# Patient Record
Sex: Male | Born: 1955 | Race: Black or African American | Hispanic: No | Marital: Married | Smoking: Current every day smoker
Health system: Southern US, Community
[De-identification: ages and names within clinical notes are randomized; demographics above are authoritative.]

## PROBLEM LIST (undated history)

## (undated) DIAGNOSIS — Z8719 Personal history of other diseases of the digestive system: Secondary | ICD-10-CM

## (undated) DIAGNOSIS — Z9289 Personal history of other medical treatment: Secondary | ICD-10-CM

## (undated) DIAGNOSIS — R7303 Prediabetes: Secondary | ICD-10-CM

## (undated) DIAGNOSIS — E7800 Pure hypercholesterolemia, unspecified: Secondary | ICD-10-CM

## (undated) DIAGNOSIS — E46 Unspecified protein-calorie malnutrition: Secondary | ICD-10-CM

## (undated) DIAGNOSIS — J449 Chronic obstructive pulmonary disease, unspecified: Secondary | ICD-10-CM

## (undated) DIAGNOSIS — I071 Rheumatic tricuspid insufficiency: Secondary | ICD-10-CM

## (undated) DIAGNOSIS — R0989 Other specified symptoms and signs involving the circulatory and respiratory systems: Secondary | ICD-10-CM

## (undated) DIAGNOSIS — I272 Pulmonary hypertension, unspecified: Secondary | ICD-10-CM

## (undated) DIAGNOSIS — R0600 Dyspnea, unspecified: Secondary | ICD-10-CM

## (undated) DIAGNOSIS — I219 Acute myocardial infarction, unspecified: Secondary | ICD-10-CM

## (undated) DIAGNOSIS — M1990 Unspecified osteoarthritis, unspecified site: Secondary | ICD-10-CM

## (undated) DIAGNOSIS — F1010 Alcohol abuse, uncomplicated: Secondary | ICD-10-CM

## (undated) DIAGNOSIS — I739 Peripheral vascular disease, unspecified: Secondary | ICD-10-CM

## (undated) DIAGNOSIS — K219 Gastro-esophageal reflux disease without esophagitis: Secondary | ICD-10-CM

## (undated) DIAGNOSIS — I10 Essential (primary) hypertension: Secondary | ICD-10-CM

## (undated) DIAGNOSIS — I340 Nonrheumatic mitral (valve) insufficiency: Secondary | ICD-10-CM

## (undated) DIAGNOSIS — I5022 Chronic systolic (congestive) heart failure: Secondary | ICD-10-CM

## (undated) DIAGNOSIS — D649 Anemia, unspecified: Secondary | ICD-10-CM

## (undated) HISTORY — PX: PERIPHERAL VASCULAR INTERVENTION: CATH118257

## (undated) HISTORY — PX: COLONOSCOPY: SHX174

## (undated) HISTORY — DX: Pure hypercholesterolemia, unspecified: E78.00

## (undated) HISTORY — PX: HEMORRHOIDECTOMY WITH HEMORRHOID BANDING: SHX5633

---

## 2002-03-09 DIAGNOSIS — Z9289 Personal history of other medical treatment: Secondary | ICD-10-CM

## 2002-03-09 DIAGNOSIS — Z8711 Personal history of peptic ulcer disease: Secondary | ICD-10-CM

## 2002-03-09 DIAGNOSIS — I219 Acute myocardial infarction, unspecified: Secondary | ICD-10-CM

## 2002-03-09 HISTORY — DX: Personal history of peptic ulcer disease: Z87.11

## 2002-03-09 HISTORY — DX: Personal history of other medical treatment: Z92.89

## 2002-03-09 HISTORY — DX: Acute myocardial infarction, unspecified: I21.9

## 2015-03-10 DIAGNOSIS — I071 Rheumatic tricuspid insufficiency: Secondary | ICD-10-CM

## 2015-03-10 HISTORY — DX: Rheumatic tricuspid insufficiency: I07.1

## 2016-01-18 DIAGNOSIS — Z720 Tobacco use: Secondary | ICD-10-CM | POA: Insufficient documentation

## 2016-01-18 DIAGNOSIS — I5022 Chronic systolic (congestive) heart failure: Secondary | ICD-10-CM | POA: Insufficient documentation

## 2016-01-18 DIAGNOSIS — R238 Other skin changes: Secondary | ICD-10-CM | POA: Insufficient documentation

## 2016-01-18 DIAGNOSIS — J9601 Acute respiratory failure with hypoxia: Secondary | ICD-10-CM | POA: Insufficient documentation

## 2016-01-18 DIAGNOSIS — E785 Hyperlipidemia, unspecified: Secondary | ICD-10-CM | POA: Insufficient documentation

## 2016-01-18 DIAGNOSIS — E871 Hypo-osmolality and hyponatremia: Secondary | ICD-10-CM | POA: Insufficient documentation

## 2016-01-18 DIAGNOSIS — Z789 Other specified health status: Secondary | ICD-10-CM | POA: Insufficient documentation

## 2016-01-19 DIAGNOSIS — E876 Hypokalemia: Secondary | ICD-10-CM | POA: Insufficient documentation

## 2016-01-21 ENCOUNTER — Emergency Department (HOSPITAL_BASED_OUTPATIENT_CLINIC_OR_DEPARTMENT_OTHER)
Admission: EM | Admit: 2016-01-21 | Discharge: 2016-01-21 | Disposition: A | Discharge status: Home / Self Care | Payer: Self-pay | Attending: Emergency Medicine | Admitting: Emergency Medicine

## 2016-01-21 ENCOUNTER — Emergency Department (HOSPITAL_BASED_OUTPATIENT_CLINIC_OR_DEPARTMENT_OTHER): Payer: Self-pay

## 2016-01-21 ENCOUNTER — Encounter (HOSPITAL_BASED_OUTPATIENT_CLINIC_OR_DEPARTMENT_OTHER): Payer: Self-pay | Admitting: *Deleted

## 2016-01-21 DIAGNOSIS — L98491 Non-pressure chronic ulcer of skin of other sites limited to breakdown of skin: Secondary | ICD-10-CM

## 2016-01-21 DIAGNOSIS — Z7982 Long term (current) use of aspirin: Secondary | ICD-10-CM | POA: Insufficient documentation

## 2016-01-21 DIAGNOSIS — I110 Hypertensive heart disease with heart failure: Secondary | ICD-10-CM | POA: Insufficient documentation

## 2016-01-21 DIAGNOSIS — Z79899 Other long term (current) drug therapy: Secondary | ICD-10-CM | POA: Insufficient documentation

## 2016-01-21 DIAGNOSIS — F17210 Nicotine dependence, cigarettes, uncomplicated: Secondary | ICD-10-CM | POA: Insufficient documentation

## 2016-01-21 DIAGNOSIS — L97911 Non-pressure chronic ulcer of unspecified part of right lower leg limited to breakdown of skin: Secondary | ICD-10-CM | POA: Insufficient documentation

## 2016-01-21 DIAGNOSIS — E876 Hypokalemia: Secondary | ICD-10-CM | POA: Insufficient documentation

## 2016-01-21 DIAGNOSIS — I5023 Acute on chronic systolic (congestive) heart failure: Secondary | ICD-10-CM | POA: Insufficient documentation

## 2016-01-21 DIAGNOSIS — E11622 Type 2 diabetes mellitus with other skin ulcer: Secondary | ICD-10-CM | POA: Insufficient documentation

## 2016-01-21 HISTORY — DX: Essential (primary) hypertension: I10

## 2016-01-21 LAB — CBC WITH DIFFERENTIAL/PLATELET
BASOS ABS: 0 10*3/uL (ref 0.0–0.1)
BASOS PCT: 1 %
EOS ABS: 0 10*3/uL (ref 0.0–0.7)
Eosinophils Relative: 1 %
HCT: 40.5 % (ref 39.0–52.0)
HEMOGLOBIN: 14.1 g/dL (ref 13.0–17.0)
LYMPHS ABS: 1.1 10*3/uL (ref 0.7–4.0)
Lymphocytes Relative: 34 %
MCH: 32.8 pg (ref 26.0–34.0)
MCHC: 34.8 g/dL (ref 30.0–36.0)
MCV: 94.2 fL (ref 78.0–100.0)
Monocytes Absolute: 0.4 10*3/uL (ref 0.1–1.0)
Monocytes Relative: 12 %
NEUTROS PCT: 52 %
Neutro Abs: 1.7 10*3/uL (ref 1.7–7.7)
Platelets: 201 10*3/uL (ref 150–400)
RBC: 4.3 MIL/uL (ref 4.22–5.81)
RDW: 14.6 % (ref 11.5–15.5)
WBC: 3.3 10*3/uL — AB (ref 4.0–10.5)

## 2016-01-21 LAB — BASIC METABOLIC PANEL
ANION GAP: 9 (ref 5–15)
BUN: 8 mg/dL (ref 6–20)
CALCIUM: 8.5 mg/dL — AB (ref 8.9–10.3)
CO2: 24 mmol/L (ref 22–32)
Chloride: 97 mmol/L — ABNORMAL LOW (ref 101–111)
Creatinine, Ser: 0.72 mg/dL (ref 0.61–1.24)
Glucose, Bld: 90 mg/dL (ref 65–99)
Potassium: 3.3 mmol/L — ABNORMAL LOW (ref 3.5–5.1)
SODIUM: 130 mmol/L — AB (ref 135–145)

## 2016-01-21 LAB — BRAIN NATRIURETIC PEPTIDE: B NATRIURETIC PEPTIDE 5: 1227 pg/mL — AB (ref 0.0–100.0)

## 2016-01-21 LAB — TROPONIN I

## 2016-01-21 MED ORDER — SILVER SULFADIAZINE 1 % EX CREA
TOPICAL_CREAM | Freq: Every day | CUTANEOUS | Status: DC
  Administered 2016-01-21: 1 via TOPICAL
  Filled 2016-01-21: qty 85

## 2016-01-21 MED ORDER — POTASSIUM CHLORIDE CRYS ER 20 MEQ PO TBCR: 20.0000 meq | EXTENDED_RELEASE_TABLET | Freq: Every day | ORAL | 0 refills | Status: DC

## 2016-01-21 MED ORDER — ALBUTEROL SULFATE HFA 108 (90 BASE) MCG/ACT IN AERS
2.0000 | INHALATION_SPRAY | Freq: Once | RESPIRATORY_TRACT | Status: AC
  Administered 2016-01-21: 2 via RESPIRATORY_TRACT
  Filled 2016-01-21: qty 6.7

## 2016-01-21 MED ORDER — FUROSEMIDE 10 MG/ML IJ SOLN
80.0000 mg | Freq: Once | INTRAMUSCULAR | Status: AC
  Administered 2016-01-21: 80 mg via INTRAVENOUS
  Filled 2016-01-21: qty 8

## 2016-01-21 MED ORDER — POTASSIUM CHLORIDE CRYS ER 20 MEQ PO TBCR
40.0000 meq | EXTENDED_RELEASE_TABLET | Freq: Once | ORAL | Status: AC
  Administered 2016-01-21: 40 meq via ORAL
  Filled 2016-01-21: qty 2

## 2016-01-21 NOTE — ED Triage Notes (Signed)
Pt has lopressor and lisinopril in his medication bag that he has with him.  States that he was unaware that he was rx them.  States that he has plans to return them to the community clinic.

## 2016-01-21 NOTE — ED Provider Notes (Signed)
[facility] Provider Note   CSN: [id number] Arrival date & time: 01/21/16  1759  By signing my name below, I, [name], attest that this documentation has been prepared under the direction and in the presence of Rolan Bucco, MD. Electronically Signed: [name], ED Scribe. 01/21/16. 6:43 PM.  History   Chief Complaint Chief Complaint  Patient presents with  . Leg Swelling   The history is provided by the patient. No language interpreter was used.  HPI Comments: Wayne [name] is a 60 y.o. male with a hx of CHF, DM, and HTN who presents to the Emergency Department complaining of bilateral leg swelling onset several months ago. Pt says that there is a large blister to the anterior right leg that popped and drained last night. He notes that he has been having worsening SOB "for a while," with cough and rhinorrhea. His SOB worsens when he tries to sleep. He says that he was diagnosed with CHF last month at the community [facility]. He has wrapped his legs in gauze, but says that it continues to drain. He has not taken anything for his symptoms. Pt is prescribed Lasix. Pt was seen on 01/19/16 at [facility] and was told he had poor circulation. He denies chest pain or abdominal pain. He is allergic to penicillins. Pt is a smoker. He is not on home oxygen.   On review of care everywhere, patient was admitted to [facility] on November 11 for acute CHF exacerbation. He left AMA the next day. Reportedly he was still hypervolemic and hypokalemic on discharge.  Per notes, his last ECHO was in July 2017 with and EF of 20-30%.  Past Medical History:  Diagnosis Date  . CHF (congestive heart failure) (HCC)   . Diabetes mellitus without complication (HCC)   . Hypertension     There are no active problems to display for this patient.   History reviewed. No pertinent surgical history.   Home Medications    Prior to Admission medications     Medication Sig Start Date End Date Taking? Authorizing Provider  aspirin EC 81 MG tablet Take 81 mg by mouth daily.   Yes Historical Provider, MD  furosemide (LASIX) 20 MG tablet Take 20 mg by mouth.   Yes Historical Provider, MD  simvastatin (ZOCOR) 40 MG tablet Take 40 mg by mouth daily.   Yes Historical Provider, MD    Family History History reviewed. No pertinent family history.  Social History Social History  Substance Use Topics  . Smoking status: Current Every Day Smoker    Packs/day: 0.50    Types: Cigarettes  . Smokeless tobacco: Never Used  . Alcohol use 1.2 oz/week    2 Cans of beer per week     Allergies   Penicillins  Review of Systems Review of Systems  Constitutional: Negative for chills, diaphoresis, fatigue and fever.  HENT: Positive for rhinorrhea. Negative for congestion and sneezing.   Eyes: Negative.   Respiratory: Positive for cough and shortness of breath. Negative for chest tightness.   Cardiovascular: Positive for leg swelling. Negative for chest pain.  Gastrointestinal: Negative for abdominal pain, blood in stool, diarrhea, nausea and vomiting.  Genitourinary: Negative for difficulty urinating, flank pain, frequency and hematuria.  Musculoskeletal: Positive for arthralgias. Negative for back pain.  Skin: Positive for wound. Negative for rash.  Neurological: Negative for dizziness, speech difficulty, weakness, numbness and headaches.   Physical Exam Updated Vital Signs BP 143/96 (BP Location:  Left Arm)   Pulse 101   Temp 97.6 F (36.4 C) (Oral)   Resp 20   Ht 5\' 3"  (1.6 m)   Wt 110 lb (49.9 kg)   SpO2 96%   BMI 19.49 kg/m   Physical Exam  Constitutional: He is oriented to person, place, and time. He appears well-developed and well-nourished.  HENT:  Head: Normocephalic and atraumatic.  Eyes: Pupils are equal, round, and reactive to light.  Neck: Normal range of motion. Neck supple.  Cardiovascular: Normal rate, regular rhythm and  normal heart sounds.   Pulmonary/Chest: Effort normal. No respiratory distress. He has wheezes. He has no rales. He exhibits no tenderness.  Abdominal: Soft. Bowel sounds are normal. There is no tenderness. There is no rebound and no guarding.  Musculoskeletal: Normal range of motion. He exhibits edema.  3+ pitting edema bilaterally; 4 cm ruptured blister to right anterior lower leg. There is no purulent drainage, evidence of cellulitis.   Lymphadenopathy:    He has no cervical adenopathy.  Neurological: He is alert and oriented to person, place, and time.  Skin: Skin is warm and dry. No rash noted.  Psychiatric: He has a normal mood and affect.   ED Treatments / Results  DIAGNOSTIC STUDIES: Oxygen Saturation is 96% on RA, normal by my interpretation.    COORDINATION OF CARE: 6:39 PM-Discussed treatment plan which includes labs and chest x-ray with pt at bedside and pt agreed to plan.    Labs (all labs ordered are listed, but only abnormal results are displayed) Labs Reviewed  CBC WITH DIFFERENTIAL/PLATELET - Abnormal; Notable for the following:       Result Value   WBC 3.3 (*)    All other components within normal limits  BRAIN NATRIURETIC PEPTIDE - Abnormal; Notable for the following:    B Natriuretic Peptide 1,227.0 (*)    All other components within normal limits  BASIC METABOLIC PANEL - Abnormal; Notable for the following:    Sodium 130 (*)    Potassium 3.3 (*)    Chloride 97 (*)    Calcium 8.5 (*)    All other components within normal limits  TROPONIN I    EKG  EKG Interpretation None       Radiology Dg Chest 2 View  Result Date: 01/21/2016 CLINICAL DATA:  Shortness of breath. EXAM: CHEST  2 VIEW COMPARISON:  01/18/2016 FINDINGS: The cardiac silhouette remains mildly enlarged. Aortic atherosclerosis is noted. The lungs remain hyperinflated with chronic bronchitic changes. Fissural thickening is unchanged. No confluent airspace opacity, overt pulmonary edema,  pleural effusion, or pneumothorax is identified. No acute osseous abnormality is seen. IMPRESSION: 1. Chronic bronchitic changes without evidence of acute cardiopulmonary process. 2. Aortic atherosclerosis. Electronically Signed   By: Sebastian AcheAllen  Grady M.D.   On: 01/21/2016 19:51    Procedures Procedures (including critical care time)  Medications Ordered in ED Medications  furosemide (LASIX) injection 80 mg (80 mg Intravenous Given 01/21/16 1900)  albuterol (PROVENTIL HFA;VENTOLIN HFA) 108 (90 Base) MCG/ACT inhaler 2 puff (2 puffs Inhalation Given 01/21/16 1939)  potassium chloride SA (K-DUR,KLOR-CON) CR tablet 40 mEq (40 mEq Oral Given 01/21/16 2035)     Initial Impression / Assessment and Plan / ED Course  I have reviewed the triage vital signs and the nursing notes.  Pertinent labs & imaging results that were available during my care of the patient were reviewed by me and considered in my medical decision making (see chart for details).  Clinical Course  Patient presents with some signs of fluid overload. He has some mild wheezing or shortness of breath on exam. He likely has a component of COPD as well as CHF. He was given an albuterol inhaler which did seem to improve his symptoms. He is elevated however it is lower than when he was recently hospitalized at [facility]. At that time it was over 4000. He was given Lasix in the ED and has diuresed a significant amount. He states he's feeling much better. His breathing is back to baseline. He's maintaining normal oxygen saturations. He has no chest pain or other symptoms that would be more concerning for acute coronary syndrome. The skin around his ulcer was debrided. A Silvadene dressing was applied. He was advised to change the dressing daily. There is no signs of surrounding cellulitis. He has some mild hypokalemia and this was replaced in the emergency department. He was given a prescription for a three-day supply. He was  encouraged to have close follow-up with his PCP. He is being seen at the [facility] in [place]. He sees Dr. Beverely Pace with cardiology there. He states he has an appointment next week to be seen. He will let them know that he needs to have his potassium rechecked. I also redness on his discharge papers. I advised him to return here if he has any worsening shortness of breath or chest pain. Also advised him return if he has any increased redness or drainage from his leg wound. He will continue his home Lasix. Patient does not want to be admitted to the hospital and I feel that it's reasonable to send him home. He is currently asymptomatic.  Final Clinical Impressions(s) / ED Diagnoses   Final diagnoses:  None  I personally performed the services described in this documentation, which was scribed in my presence.  The recorded information has been reviewed and considered.   New Prescriptions New Prescriptions   No medications on file     Rolan Bucco, MD 01/21/16 2142

## 2016-01-21 NOTE — ED Triage Notes (Signed)
Bilateral leg swelling-right leg has blisters that have popped and are leaking.

## 2016-01-21 NOTE — Discharge Instructions (Signed)
YOU NEED TO follow-up with her cardiologist as soon as possible. Your potassium was low at 3.0. This needs to be closely followed her primary care doctor. Return to emergency department for any worsening symptoms including chest pain or worsening shortness of breath. Return if you have any worsening drainage or redness to your leg wound. Change her dressing daily.

## 2016-01-27 ENCOUNTER — Encounter (HOSPITAL_BASED_OUTPATIENT_CLINIC_OR_DEPARTMENT_OTHER): Payer: Self-pay | Admitting: *Deleted

## 2016-01-27 ENCOUNTER — Emergency Department (HOSPITAL_BASED_OUTPATIENT_CLINIC_OR_DEPARTMENT_OTHER)
Admission: EM | Admit: 2016-01-27 | Discharge: 2016-01-28 | Disposition: A | Discharge status: Home / Self Care | Payer: Self-pay | Attending: Emergency Medicine | Admitting: Emergency Medicine

## 2016-01-27 DIAGNOSIS — T1490XD Injury, unspecified, subsequent encounter: Secondary | ICD-10-CM

## 2016-01-27 DIAGNOSIS — I509 Heart failure, unspecified: Secondary | ICD-10-CM | POA: Insufficient documentation

## 2016-01-27 DIAGNOSIS — I110 Hypertensive heart disease with heart failure: Secondary | ICD-10-CM | POA: Insufficient documentation

## 2016-01-27 DIAGNOSIS — Z79899 Other long term (current) drug therapy: Secondary | ICD-10-CM | POA: Insufficient documentation

## 2016-01-27 DIAGNOSIS — E119 Type 2 diabetes mellitus without complications: Secondary | ICD-10-CM | POA: Insufficient documentation

## 2016-01-27 DIAGNOSIS — F17210 Nicotine dependence, cigarettes, uncomplicated: Secondary | ICD-10-CM | POA: Insufficient documentation

## 2016-01-27 DIAGNOSIS — L97919 Non-pressure chronic ulcer of unspecified part of right lower leg with unspecified severity: Secondary | ICD-10-CM | POA: Insufficient documentation

## 2016-01-27 DIAGNOSIS — Z7982 Long term (current) use of aspirin: Secondary | ICD-10-CM | POA: Insufficient documentation

## 2016-01-27 MED ORDER — ONDANSETRON 4 MG PO TBDP: 4.0000 mg | ORAL_TABLET | Freq: Three times a day (TID) | ORAL | 1 refills | Status: DC | PRN

## 2016-01-27 MED ORDER — HYDROCODONE-ACETAMINOPHEN 5-325 MG PO TABS
1.0000 | ORAL_TABLET | Freq: Once | ORAL | Status: AC
  Administered 2016-01-28: 1 via ORAL
  Filled 2016-01-27: qty 1

## 2016-01-27 MED ORDER — HYDROCODONE-ACETAMINOPHEN 5-325 MG PO TABS: 1.0000 | ORAL_TABLET | ORAL | 0 refills | Status: DC | PRN

## 2016-01-27 MED ORDER — ONDANSETRON 4 MG PO TBDP
4.0000 mg | ORAL_TABLET | Freq: Once | ORAL | Status: AC
  Administered 2016-01-27: 4 mg via ORAL
  Filled 2016-01-27: qty 1

## 2016-01-27 NOTE — ED Provider Notes (Signed)
[facility] Provider Note: Wayne DellJ. Lane Leanore Biggers, MD, FACEP  CSN: [id number] MRN: [id number] ARRIVAL: 01/27/16 at 2210 ROOM: MH08/MH08  By signing my name below, I, Wayne [name], attest that this documentation has been prepared under the direction and in the presence of Wayne LibraJohn Cherri Yera, MD. Electronically Signed: Bridgette HabermannMaria [name], ED Scribe. 01/27/16. 11:48 PM.  CHIEF COMPLAINT  Leg Pain   HISTORY OF PRESENT ILLNESS  HPI Comments: Wayne [name] is a 60 y.o. male with h/o CHF, COPD, DM, peripheral vascular disease and HTN who presents to the Emergency Department complaining of a wound to anterior right leg with pain and swelling. He had significant lower extremity edema about a week ago and developed a large bulla of the right anterior lower leg. The bulla burst 7 days ago and he was seen in the ED on the 14th. He had his wound debrided and dressed with Silvadene. The community clinic he attends prescribed Neosporin ointment and he has been using both. Pt presents today with continued "burning" pain to the area with serous drainage. Pain is exacerbated with ambulating and movement, and he is having difficulty extending his right leg fully at the knee and ankle. Although his lower extremity edema has significantly improved he has some remaining lower extremity edema, more prominent on the right. Pt is regularly on Lasix for his leg swelling. He denies fever. He has chronic shortness of breath due to his CHF and COPD which have not acutely worsened.  Past Medical History:  Diagnosis Date  . CHF (congestive heart failure) (HCC)   . Diabetes mellitus without complication (HCC)   . Hypertension     History reviewed. No pertinent surgical history.  History reviewed. No pertinent family history.  Social History  Substance Use Topics  . Smoking status: Current Every Day Smoker    Packs/day: 0.50    Types: Cigarettes  . Smokeless tobacco: Never Used  . Alcohol use 1.2 oz/week    2 Cans of beer  per week    Prior to Admission medications   Medication Sig Start Date End Date Taking? Authorizing Provider  aspirin EC 81 MG tablet Take 81 mg by mouth daily.    Historical Provider, MD  furosemide (LASIX) 20 MG tablet Take 20 mg by mouth.    Historical Provider, MD  HYDROcodone-acetaminophen (NORCO/VICODIN) 5-325 MG tablet Take 1 tablet by mouth every 4 (four) hours as needed (for leg pain). 01/27/16   Wayne Shuford, MD  ondansetron (ZOFRAN ODT) 4 MG disintegrating tablet Take 1 tablet (4 mg total) by mouth every 8 (eight) hours as needed for nausea or vomiting. 01/27/16   Wayne Finnicum, MD  potassium chloride SA (K-DUR,KLOR-CON) 20 MEQ tablet Take 1 tablet (20 mEq total) by mouth daily. 01/21/16   Wayne BuccoMelanie Belfi, MD  simvastatin (ZOCOR) 40 MG tablet Take 40 mg by mouth daily.    Historical Provider, MD    Allergies Penicillins   REVIEW OF SYSTEMS  Negative except as noted here or in the History of Present Illness.   PHYSICAL EXAMINATION  Initial Vital Signs Blood pressure 164/100, pulse 101, temperature 97.7 F (36.5 C), temperature source Oral, resp. rate 18, weight 110 lb (49.9 kg), SpO2 94 %.  Examination General: Well-developed, well-nourished male in no acute distress; appearance consistent with age of record HENT: normocephalic; atraumatic Eyes: pupils equal, round and reactive to light; extraocular muscles intact Neck: supple Heart: regular rate and rhythm Lungs: clear to auscultation bilaterally Abdomen: soft; nondistended; nontender; no masses or hepatosplenomegaly;  bowel sounds present Extremities: No deformity; full range of motion except right knee and right ankle due to edema; pulses +1; +1 pitting edema of lower legs, more tense on the right Neurologic: Awake, alert and oriented; motor function intact in all extremities and symmetric; no facial droop Skin: Warm and dry; 9 cm x 9.5 cm shallow ulceration of the right anterior lower leg with a well-granulating base with  no signs of infection (no surrounding erythema, warmth or focal edema)  Psychiatric: Normal mood and affect   RESULTS  Summary of this visit's results, reviewed by myself:   EKG Interpretation  Date/Time:    Ventricular Rate:    PR Interval:    QRS Duration:   QT Interval:    QTC Calculation:   R Axis:     Text Interpretation:        Laboratory Studies: No results found for this or any previous visit (from the past 24 hour(s)). Imaging Studies: No results found.  ED COURSE  Nursing notes and initial vitals signs, including pulse oximetry, reviewed.  Vitals:   01/27/16 2214  BP: 164/100  Pulse: 101  Resp: 18  Temp: 97.7 F (36.5 C)  TempSrc: Oral  SpO2: 94%  Weight: 110 lb (49.9 kg)   We will have nursing staff demonstrate how to best address his wound. We will treat his pain as this is his principal concern. There is not appear to be an infection requiring antibiotics. We will also have him return for a Doppler ultrasound of the right lower extremity to evaluate for possible DVT.   PROCEDURES    ED DIAGNOSES     ICD-9-CM ICD-10-CM   1. Healing wound 959.9 T14.90XD    I personally performed the services described in this documentation, which was scribed in my presence. The recorded information has been reviewed and is accurate.     Wayne Libra, MD 01/28/16 [phone]

## 2016-01-27 NOTE — ED Triage Notes (Signed)
Pt c/o right leg pain/ wound  x 6 days ago, seen here 6 days ago for same

## 2016-01-28 ENCOUNTER — Ambulatory Visit (HOSPITAL_BASED_OUTPATIENT_CLINIC_OR_DEPARTMENT_OTHER)
Admission: RE | Admit: 2016-01-28 | Discharge: 2016-01-28 | Disposition: A | Discharge status: Home / Self Care | Payer: Self-pay | Source: Ambulatory Visit | Attending: Emergency Medicine | Admitting: Emergency Medicine

## 2016-01-28 DIAGNOSIS — M79604 Pain in right leg: Secondary | ICD-10-CM | POA: Insufficient documentation

## 2016-01-28 DIAGNOSIS — M7989 Other specified soft tissue disorders: Secondary | ICD-10-CM | POA: Insufficient documentation

## 2016-01-28 MED FILL — HYDROCODON-APAP 5-325: 5-325 | 3 days supply | Qty: 20 | Fill #0

## 2016-02-17 ENCOUNTER — Emergency Department (HOSPITAL_BASED_OUTPATIENT_CLINIC_OR_DEPARTMENT_OTHER): Payer: Medicaid Other

## 2016-02-17 ENCOUNTER — Encounter (HOSPITAL_BASED_OUTPATIENT_CLINIC_OR_DEPARTMENT_OTHER): Payer: Self-pay

## 2016-02-17 ENCOUNTER — Emergency Department (HOSPITAL_BASED_OUTPATIENT_CLINIC_OR_DEPARTMENT_OTHER)
Admission: EM | Admit: 2016-02-17 | Discharge: 2016-02-17 | Disposition: A | Discharge status: Home / Self Care | Payer: Medicaid Other | Attending: Emergency Medicine | Admitting: Emergency Medicine

## 2016-02-17 DIAGNOSIS — M79605 Pain in left leg: Secondary | ICD-10-CM | POA: Diagnosis present

## 2016-02-17 DIAGNOSIS — M7989 Other specified soft tissue disorders: Secondary | ICD-10-CM | POA: Insufficient documentation

## 2016-02-17 DIAGNOSIS — F17210 Nicotine dependence, cigarettes, uncomplicated: Secondary | ICD-10-CM | POA: Insufficient documentation

## 2016-02-17 DIAGNOSIS — J449 Chronic obstructive pulmonary disease, unspecified: Secondary | ICD-10-CM | POA: Diagnosis not present

## 2016-02-17 DIAGNOSIS — I509 Heart failure, unspecified: Secondary | ICD-10-CM | POA: Insufficient documentation

## 2016-02-17 DIAGNOSIS — Z79899 Other long term (current) drug therapy: Secondary | ICD-10-CM | POA: Diagnosis not present

## 2016-02-17 DIAGNOSIS — R0602 Shortness of breath: Secondary | ICD-10-CM | POA: Diagnosis not present

## 2016-02-17 DIAGNOSIS — M79604 Pain in right leg: Secondary | ICD-10-CM | POA: Diagnosis not present

## 2016-02-17 DIAGNOSIS — R05 Cough: Secondary | ICD-10-CM | POA: Insufficient documentation

## 2016-02-17 DIAGNOSIS — I110 Hypertensive heart disease with heart failure: Secondary | ICD-10-CM | POA: Diagnosis not present

## 2016-02-17 DIAGNOSIS — Z7982 Long term (current) use of aspirin: Secondary | ICD-10-CM | POA: Diagnosis not present

## 2016-02-17 DIAGNOSIS — E119 Type 2 diabetes mellitus without complications: Secondary | ICD-10-CM | POA: Diagnosis not present

## 2016-02-17 HISTORY — DX: Chronic obstructive pulmonary disease, unspecified: J44.9

## 2016-02-17 LAB — TROPONIN I: Troponin I: <0.03 ng/mL (ref <0.03)

## 2016-02-17 LAB — CBC WITH DIFFERENTIAL/PLATELET
BASOS ABS: 0 10*3/uL (ref 0.0–0.1)
BASOS PCT: 1 %
EOS ABS: 0 10*3/uL (ref 0.0–0.7)
Eosinophils Relative: 1 %
HCT: 38.5 % — ABNORMAL LOW (ref 39.0–52.0)
HEMOGLOBIN: 13.6 g/dL (ref 13.0–17.0)
Lymphocytes Relative: 25 %
Lymphs Abs: 1.4 10*3/uL (ref 0.7–4.0)
MCH: 32.6 pg (ref 26.0–34.0)
MCHC: 35.3 g/dL (ref 30.0–36.0)
MCV: 92.3 fL (ref 78.0–100.0)
MONO ABS: 0.5 10*3/uL (ref 0.1–1.0)
MONOS PCT: 9 %
NEUTROS PCT: 64 %
Neutro Abs: 3.4 10*3/uL (ref 1.7–7.7)
Platelets: 186 10*3/uL (ref 150–400)
RBC: 4.17 MIL/uL — ABNORMAL LOW (ref 4.22–5.81)
RDW: 14.1 % (ref 11.5–15.5)
WBC: 5.3 10*3/uL (ref 4.0–10.5)

## 2016-02-17 LAB — BASIC METABOLIC PANEL
Anion gap: 7 (ref 5–15)
BUN: 10 mg/dL (ref 6–20)
CHLORIDE: 101 mmol/L (ref 101–111)
CO2: 25 mmol/L (ref 22–32)
Calcium: 8.5 mg/dL — ABNORMAL LOW (ref 8.9–10.3)
Creatinine, Ser: 0.84 mg/dL (ref 0.61–1.24)
GFR calc Af Amer: >60 mL/min (ref >60)
GFR calc non Af Amer: >60 mL/min (ref >60)
GLUCOSE: 90 mg/dL (ref 65–99)
POTASSIUM: 4.1 mmol/L (ref 3.5–5.1)
SODIUM: 133 mmol/L — AB (ref 135–145)

## 2016-02-17 LAB — BRAIN NATRIURETIC PEPTIDE: B NATRIURETIC PEPTIDE 5: 963 pg/mL — AB (ref 0.0–100.0)

## 2016-02-17 MED ORDER — DIPHENHYDRAMINE HCL 25 MG PO TABS: 25.0000 mg | ORAL_TABLET | Freq: Four times a day (QID) | ORAL | 0 refills | Status: DC | PRN

## 2016-02-17 MED ORDER — ACETAMINOPHEN 500 MG PO TABS: 1000.0000 mg | ORAL_TABLET | Freq: Four times a day (QID) | ORAL | 0 refills | Status: AC | PRN

## 2016-02-17 NOTE — ED Notes (Signed)
Nurse explained dc instructions to pt as did EDP.  Pt denies any further needs at this time, does not require anything further.

## 2016-02-17 NOTE — ED Provider Notes (Signed)
[facility] Provider Note   CSN: [id number] Arrival date & time: 02/17/16  1925 By signing my name below, I, Linus Galas, attest that this documentation has been prepared under the direction and in the presence of Roxy Horseman. Electronically Signed: Linus Galas, ED Scribe. 02/17/16. 8:16 PM.  History   Chief Complaint Chief Complaint  Patient presents with  . Leg Pain   The history is provided by the patient. No language interpreter was used.   HPI Comments: RICHEY DOOLITTLE is a 60 y.o. male who presents to the Emergency Department with a PMHx of CHF, COPD, DM, and HTN complaining of bilateral leg pain that began 1 week ago with associated itching,swelling and erythema. Pt also reports cough and SOB, But states that this is no worsening normal.. No aggravating of alleviating factors contributing to leg pain. Pt used various ointments with no relief. SOB alleviated when sitting up. Pt denies any fevers, chills, CP, N/V/D or any other symptoms at this time.   Past Medical History:  Diagnosis Date  . CHF (congestive heart failure) (HCC)   . COPD (chronic obstructive pulmonary disease) (HCC)   . Diabetes mellitus without complication (HCC)   . Hypertension    There are no active problems to display for this patient.  History reviewed. No pertinent surgical history.  Home Medications    Prior to Admission medications   Medication Sig Start Date End Date Taking? Authorizing Provider  aspirin EC 81 MG tablet Take 81 mg by mouth daily.    Historical Provider, MD  furosemide (LASIX) 20 MG tablet Take 20 mg by mouth.    Historical Provider, MD  HYDROcodone-acetaminophen (NORCO/VICODIN) 5-325 MG tablet Take 1 tablet by mouth every 4 (four) hours as needed (for leg pain). 01/27/16   John Molpus, MD  ondansetron (ZOFRAN ODT) 4 MG disintegrating tablet Take 1 tablet (4 mg total) by mouth every 8 (eight) hours as needed for nausea or vomiting. 01/27/16   John Molpus, MD    simvastatin (ZOCOR) 40 MG tablet Take 40 mg by mouth daily.    Historical Provider, MD   Family History No family history on file.  Social History Social History  Substance Use Topics  . Smoking status: Current Every Day Smoker    Packs/day: 0.50    Types: Cigarettes  . Smokeless tobacco: Never Used  . Alcohol use Yes     Comment: weekly    Allergies   Penicillins  Review of Systems Review of Systems  Constitutional: Negative for chills and fever.  Respiratory: Positive for cough and shortness of breath.   Cardiovascular: Positive for leg swelling. Negative for chest pain.  Gastrointestinal: Negative for diarrhea, nausea and vomiting.  All other systems reviewed and are negative.  Physical Exam Updated Vital Signs BP 151/96 (BP Location: Left Arm)   Pulse 107   Temp 97.7 F (36.5 C) (Oral)   Resp (!) 28   Ht 5\' 3"  (1.6 m)   Wt 114 lb (51.7 kg)   SpO2 94%   BMI 20.19 kg/m   Physical Exam  Constitutional: He is oriented to person, place, and time. He appears well-developed and well-nourished.  HENT:  Head: Normocephalic and atraumatic.  Eyes: Conjunctivae and EOM are normal. Pupils are equal, round, and reactive to light. Right eye exhibits no discharge. Left eye exhibits no discharge. No scleral icterus.  Neck: Normal range of motion. Neck supple. No JVD present.  Cardiovascular: Normal rate, regular rhythm and normal heart sounds.  Exam  reveals no gallop and no friction rub.   No murmur heard. Pulmonary/Chest: Effort normal and breath sounds normal. No respiratory distress. He has no wheezes. He has no rales. He exhibits no tenderness.  Abdominal: Soft. He exhibits no distension and no mass. There is no tenderness. There is no rebound and no guarding.  Musculoskeletal: Normal range of motion. He exhibits no edema or tenderness.  Very mild edema of the lower extremities  Neurological: He is alert and oriented to person, place, and time.  Skin: Skin is warm and  dry.  No evidence of cellulitis or abscess  Psychiatric: He has a normal mood and affect. His behavior is normal. Judgment and thought content normal.  Nursing note and vitals reviewed.    ED Treatments / Results  DIAGNOSTIC STUDIES: Oxygen Saturation is 90% on room air, normal by my interpretation.    COORDINATION OF CARE: 8:16 PM Discussed treatment plan with pt at bedside and pt agreed to plan.  Labs (all labs ordered are listed, but only abnormal results are displayed) Labs Reviewed - No data to display  EKG  EKG Interpretation  Date/Time:  Monday February 17 2016 19:45:47 EST Ventricular Rate:  103 PR Interval:    QRS Duration: 81 QT Interval:  418 QTC Calculation: 548 R Axis:   86 Text Interpretation:  Sinus tachycardia LAE, consider biatrial enlargement Borderline right axis deviation Borderline T abnormalities, lateral leads Prolonged QT interval No significant change since last tracing Confirmed by Ethelda ChickJACUBOWITZ  MD, SAM [phone]([id number]) on 02/17/2016 7:59:29 PM       Radiology Dg Chest 2 View  Result Date: 02/17/2016 CLINICAL DATA:  Shortness of breath with swelling in the lower extremities EXAM: CHEST  2 VIEW COMPARISON:  01/21/2016 FINDINGS: The lungs are slightly hyperinflated. No pleural effusion or focal consolidation. Mild diffuse interstitial prominence as before. Mild thickening along the pulmonary fissures. There is mild cardiomegaly without overt failure. No pneumothorax. Lucency left hemidiaphragm likely relates to gas within the stomach. IMPRESSION: 1. No acute infiltrate or edema 2. Stable degree of mild cardiomegaly Electronically Signed   By: Jasmine PangKim  Fujinaga M.D.   On: 02/17/2016 21:07    Procedures Procedures (including critical care time)  Medications Ordered in ED Medications - No data to display   Initial Impression / Assessment and Plan / ED Course  I have reviewed the triage vital signs and the nursing notes.  Pertinent labs & imaging results that  were available during my care of the patient were reviewed by me and considered in my medical decision making (see chart for details).  Clinical Course     Patient complaining of bilateral lower extremity pain and itching. He has a history of CHF. He takes Lasix. He has follow-up in January. He states that he has had some baseline shortness of breath, but this is no worse than normal. Chest x-ray shows no fluid overload or infiltrate. Vital signs are stable. He has very mild swelling on his lower extremities. BNP is lower than in the past. Patient seen by and discussed with Dr. Rennis ChrisJacobowitz, who agrees with plan for discharge. Believe that symptoms are related to peripheral vascular disease, and recommends Tylenol and Benadryl. Will discharge with the above. Patient understands agrees the plan. He is stable and ready for discharge.  Final Clinical Impressions(s) / ED Diagnoses   Final diagnoses:  Bilateral leg pain    New Prescriptions New Prescriptions   ACETAMINOPHEN (TYLENOL) 500 MG TABLET    Take 2 tablets (1,000 mg  total) by mouth every 6 (six) hours as needed.   DIPHENHYDRAMINE (BENADRYL) 25 MG TABLET    Take 1 tablet (25 mg total) by mouth every 6 (six) hours as needed for itching (Rash).   I personally performed the services described in this documentation, which was scribed in my presence. The recorded information has been reviewed and is accurate.      Roxy Horseman, PA-C 02/17/16 2206    Doug Sou, MD 02/18/16 [phone]

## 2016-02-17 NOTE — ED Notes (Signed)
ED Provider at bedside. 

## 2016-02-17 NOTE — ED Notes (Signed)
EKG done by EMT Alfred. 

## 2016-02-17 NOTE — ED Notes (Signed)
Lab called and stated BMP was hemolyzed and needs to be re-drawn. Lab re-ordered and RN aware.

## 2016-02-17 NOTE — ED Notes (Signed)
Pt c/o bilateral leg pain and swelling that is chronic.  Pt is unclear as to what medications he is supposed to be taking, and it is not clear to staff when the last time he followed up was.  When pt is told that he has HTN, pt denies this.  Pt has lisinopril in his bag of medications as well as lasix, but pt denies taking either medication on a regular basis.  His wound from last month has healed nicely.  Peripheral pulses are present in BLE, mild edema, redness and warmth in right lower leg.  Pt has a chronic cough, no acute respiratory distress or SOB.

## 2016-02-17 NOTE — ED Notes (Signed)
Returned from xray

## 2016-02-17 NOTE — ED Triage Notes (Addendum)
C/o pain, swelling, rash to bilat LE x 1 week-NAD-answering full sentneces did have DOE-steady gait

## 2016-02-17 NOTE — ED Notes (Signed)
Patient transported to X-ray 

## 2016-02-18 NOTE — ED Provider Notes (Signed)
Complains of burning pain and itching of both legs for the past 3 weeks with slight swelling in both legs. He reports mild dyspnea but dyspnea is unchanged from baseline. He does report poor circulation. On exam chronically ill-appearing . No distress. HEENT exam no facial asymmetry neck supple, no JVD lungs clear auscultation heart regular rate and rhythm abdomen nondistended nontender extremities with trace pretibial pitting edema. Distal legs brawny, chronic appearing skin changes, hair is absent DP pulses are absent however both feet are warm,, consistent with chronic vascular insufficiency. There are no overt signs of congestive heart failure   Doug Sou, MD 02/18/16 0002

## 2016-12-11 DIAGNOSIS — M47818 Spondylosis without myelopathy or radiculopathy, sacral and sacrococcygeal region: Secondary | ICD-10-CM | POA: Insufficient documentation

## 2016-12-11 DIAGNOSIS — M1611 Unilateral primary osteoarthritis, right hip: Secondary | ICD-10-CM | POA: Insufficient documentation

## 2016-12-11 DIAGNOSIS — M5136 Other intervertebral disc degeneration, lumbar region: Secondary | ICD-10-CM | POA: Insufficient documentation

## 2017-03-19 ENCOUNTER — Encounter: Payer: Self-pay | Admitting: Vascular Surgery

## 2017-03-19 ENCOUNTER — Encounter: Payer: Self-pay | Admitting: *Deleted

## 2017-03-19 ENCOUNTER — Ambulatory Visit (INDEPENDENT_AMBULATORY_CARE_PROVIDER_SITE_OTHER): Payer: Medicaid Other | Admitting: Vascular Surgery

## 2017-03-19 ENCOUNTER — Other Ambulatory Visit: Payer: Self-pay | Admitting: *Deleted

## 2017-03-19 VITALS — BP 136/81 | HR 120 | Temp 98.5°F | Resp 16 | Ht 63.0 in | Wt 108.0 lb

## 2017-03-19 DIAGNOSIS — I998 Other disorder of circulatory system: Secondary | ICD-10-CM | POA: Diagnosis not present

## 2017-03-19 DIAGNOSIS — I70229 Atherosclerosis of native arteries of extremities with rest pain, unspecified extremity: Secondary | ICD-10-CM

## 2017-03-19 NOTE — Progress Notes (Signed)
Requested by:  Dr. Elise Benne, MD  Reason for consultation: bilateral leg PAD   History of Present Illness   Wayne [name] is a 62 y.o. (09/25/1955) male w/ known PAD who presents with chief complaint: short distance intermittent claudication.  Onset of symptoms occurred years ago.  This patient has previously been evaluated with PAD and had a L stent placed, though pt is not certain where.  His current sx are short distance intermittent claudication along with neuropathic sx in both feet that improve with position shift.  The patient does NOT describe classic rest pain.  Pain is described as cramping in calf and burning in feet, severity 1-5/10, and associated with static position for neuropathic sx and walking for the cramping.  Patient has attempted to treat this pain with rest.  The patient does not have any foot wounds.  Atherosclerotic risk factors include: DM, HLD, HTN, smoker.  Past Medical History:  Diagnosis Date  . CHF (congestive heart failure) (HCC)   . COPD (chronic obstructive pulmonary disease) (HCC)   . Diabetes mellitus without complication (HCC)   . Hypercholesterolemia   . Hypertension     Past Surgical History: none   Social History   Socioeconomic History  . Marital status: Married    Spouse name: Not on file  . Number of children: Not on file  . Years of education: Not on file  . Highest education level: Not on file  Social Needs  . Financial resource strain: Not on file  . Food insecurity - worry: Not on file  . Food insecurity - inability: Not on file  . Transportation needs - medical: Not on file  . Transportation needs - non-medical: Not on file  Occupational History  . Not on file  Tobacco Use  . Smoking status: Current Every Day Smoker    Packs/day: 1.00    Types: Cigarettes  . Smokeless tobacco: Never Used  . Tobacco comment: 4-5 cigs a day  Substance and Sexual Activity  . Alcohol use: Yes    Comment: weekly  . Drug use: No  . Sexual  activity: Not on file  Other Topics Concern  . Not on file  Social History Narrative  . Not on file    Family History  Problem Relation Age of Onset  . Hypertension Sister   . Hyperlipidemia Sister   . Diabetes Sister     Current Outpatient Medications  Medication Sig Dispense Refill  . acetaminophen (TYLENOL) 500 MG tablet Take 2 tablets (1,000 mg total) by mouth every 6 (six) hours as needed. 30 tablet 0  . albuterol (PROVENTIL HFA;VENTOLIN HFA) 108 (90 Base) MCG/ACT inhaler Inhale 2 puffs into the lungs every 6 (six) hours as needed for wheezing or shortness of breath.    Marland Kitchen aspirin EC 81 MG tablet Take 81 mg by mouth daily.    . budesonide-formoterol (SYMBICORT) 160-4.5 MCG/ACT inhaler Inhale 2 puffs into the lungs 2 (two) times daily.    . carvedilol (COREG) 3.125 MG tablet Take 3.125 mg by mouth 2 (two) times daily with a meal.    . furosemide (LASIX) 20 MG tablet Take 20 mg by mouth.    . Magnesium Oxide 500 MG CAPS Take 500 mg by mouth daily.    Marland Kitchen omeprazole (PRILOSEC) 20 MG capsule Take 20 mg by mouth daily.    . ondansetron (ZOFRAN ODT) 4 MG disintegrating tablet Take 1 tablet (4 mg total) by mouth every 8 (eight) hours as needed for  nausea or vomiting. 10 tablet 1  . sacubitril-valsartan (ENTRESTO) 97-103 MG Take 1 tablet by mouth 2 (two) times daily.     No current facility-administered medications for this visit.     Allergies  Allergen Reactions  . Penicillins     REVIEW OF SYSTEMS (negative unless checked):   Cardiac:  []  Chest pain or chest pressure? [x]  Shortness of breath upon activity? []  Shortness of breath when lying flat? []  Irregular heart rhythm?  Vascular:  [x]  Pain in calf, thigh, or hip brought on by walking? []  Pain in feet at night that wakes you up from your sleep? []  Blood clot in your veins? [x]  Leg swelling?  Pulmonary:  []  Oxygen at home? [x]  Productive cough? []  Wheezing?  Neurologic:  []  Sudden weakness in arms or legs? []   Sudden numbness in arms or legs? []  Sudden onset of difficult speaking or slurred speech? []  Temporary loss of vision in one eye? []  Problems with dizziness?  Gastrointestinal:  []  Blood in stool? []  Vomited blood?  Genitourinary:  []  Burning when urinating? []  Blood in urine?  Psychiatric:  []  Major depression  Hematologic:  []  Bleeding problems? []  Problems with blood clotting?  Dermatologic:  []  Rashes or ulcers?  Constitutional:  []  Fever or chills?  Ear/Nose/Throat:  []  Change in hearing? []  Nose bleeds? []  Sore throat?  Musculoskeletal:  []  Back pain? []  Joint pain? []  Muscle pain?   For VQI Use Only   PRE-ADM LIVING Home  AMB STATUS Ambulatory  CAD Sx None  PRIOR CHF None  STRESS TEST No    Physical Examination     Vitals:   03/19/17 1020  BP: 136/81  Pulse: (!) 120  Resp: 16  Temp: 98.5 F (36.9 C)  TempSrc: Oral  SpO2: (!) 88%  Weight: 108 lb (49 kg)  Height: 5\' 3"  (1.6 m)   Body mass index is 19.13 kg/m.  General Alert, O x 3, WD, NAD  Head [place]/AT,    Ear/Nose/ Throat Hearing grossly intact, nares without erythema or drainage, oropharynx without Erythema or Exudate, Mallampati score: 3,   Eyes PERRLA, EOMI,    Neck Supple, mid-line trachea,    Pulmonary Sym exp, Decreased BLL air movt, faint BLL rales  Cardiac RRR, Nl S1, S2, no Murmurs, No rubs, No S3,S4  Vascular Vessel Right Left  Radial Palpable Palpable  Brachial Palpable Palpable  Carotid Palpable, No Bruit Palpable, No Bruit  Aorta Not palpable N/A  Femoral Palpable Palpable  Popliteal Not palpable Not palpable  PT Not palpable Not palpable  DP Not palpable Not palpable    Gastro- intestinal soft, non-distended, non-tender to palpation, No guarding or rebound, no HSM, no masses, no CVAT B, No palpable prominent aortic pulse,    Musculo- skeletal M/S 5/5 throughout  , Extremities without ischemic changes  , No edema present, No visible varicosities , No  Lipodermatosclerosis present  Neurologic Cranial nerves 2-12 intact , Pain and light touch intact in extremities , Motor exam as listed above  Psychiatric Judgement intact, Mood & affect appropriate for pt's clinical situation  Dermatologic See M/S exam for extremity exam, No rashes otherwise noted  Lymphatic  Palpable lymph nodes: None    Non-Invasive Vascular Imaging   Outside ABI (02/05/17)  R:   ABI: 0.35  L:   ABI: 0.66  Outside BLE Arterial Duplex (02/05/17)  B SFA occlusion  Likely inflow disease to R leg   Outside Studies/Documentation   10 pages of outside documents  were reviewed including: outside cardiology records and above studies.   Medical Decision Making   Wayne [name] is a 62 y.o. male who presents with: RLE critical limb ischemia, moderate LLE PAD   I discussed with the patient the natural history of critical limb ischemia: 25% require amputation in one year, 50% are able to maintain their limbs in one year, and 25-30% die in one year due to comorbidities.  Given the limb threatening status of this patient, I recommend an aggressive work up including proceeding with an: Aortogram, Bilateral runoff and possible intervention. I discussed with the patient the nature of angiographic procedures, especially the limited patencies of any endovascular intervention. The patient is aware of that the risks of an angiographic procedure include but are not limited to: bleeding, infection, access site complications, embolization, rupture of treated vessel, dissection, possible need for emergent surgical intervention, and possible need for surgical procedures to treat the patient's pathology. The patient is aware of the risks and agrees to proceed.  The procedure is scheduled for: 24 JAN 19.  I discussed in depth with the patient the nature of atherosclerosis, and emphasized the importance of maximal medical management including strict control of blood pressure, blood  glucose, and lipid levels, antiplatelet agents, obtaining regular exercise, and cessation of smoking.  The patient is aware that without maximal medical management the underlying atherosclerotic disease process will progress, limiting the benefit of any interventions. The patient is currently not on on statin as not medically indicated.  The patient is currently on an anti-platelet: ASA.  Thank you for allowing [place] to participate in this patient's care.   Leonides Sake, MD, FACS [facility] Office: [phone] Pager: [phone]  03/19/2017, 10:51 AM

## 2017-03-19 NOTE — H&P (View-Only) (Signed)
  Requested by:  Dr. Duran, MD  Reason for consultation: bilateral leg PAD   History of Present Illness   Wayne [name] is a 61 y.o. (03/31/1955) male w/ known PAD who presents with chief complaint: short distance intermittent claudication.  Onset of symptoms occurred years ago.  This patient has previously been evaluated with PAD and had a L stent placed, though pt is not certain where.  His current sx are short distance intermittent claudication along with neuropathic sx in both feet that improve with position shift.  The patient does NOT describe classic rest pain.  Pain is described as cramping in calf and burning in feet, severity 1-5/10, and associated with static position for neuropathic sx and walking for the cramping.  Patient has attempted to treat this pain with rest.  The patient does not have any foot wounds.  Atherosclerotic risk factors include: DM, HLD, HTN, smoker.  Past Medical History:  Diagnosis Date  . CHF (congestive heart failure) (HCC)   . COPD (chronic obstructive pulmonary disease) (HCC)   . Diabetes mellitus without complication (HCC)   . Hypercholesterolemia   . Hypertension     Past Surgical History: none   Social History   Socioeconomic History  . Marital status: Married    Spouse name: Not on file  . Number of children: Not on file  . Years of education: Not on file  . Highest education level: Not on file  Social Needs  . Financial resource strain: Not on file  . Food insecurity - worry: Not on file  . Food insecurity - inability: Not on file  . Transportation needs - medical: Not on file  . Transportation needs - non-medical: Not on file  Occupational History  . Not on file  Tobacco Use  . Smoking status: Current Every Day Smoker    Packs/day: 1.00    Types: Cigarettes  . Smokeless tobacco: Never Used  . Tobacco comment: 4-5 cigs a day  Substance and Sexual Activity  . Alcohol use: Yes    Comment: weekly  . Drug use: No  . Sexual  activity: Not on file  Other Topics Concern  . Not on file  Social History Narrative  . Not on file    Family History  Problem Relation Age of Onset  . Hypertension Sister   . Hyperlipidemia Sister   . Diabetes Sister     Current Outpatient Medications  Medication Sig Dispense Refill  . acetaminophen (TYLENOL) 500 MG tablet Take 2 tablets (1,000 mg total) by mouth every 6 (six) hours as needed. 30 tablet 0  . albuterol (PROVENTIL HFA;VENTOLIN HFA) 108 (90 Base) MCG/ACT inhaler Inhale 2 puffs into the lungs every 6 (six) hours as needed for wheezing or shortness of breath.    . aspirin EC 81 MG tablet Take 81 mg by mouth daily.    . budesonide-formoterol (SYMBICORT) 160-4.5 MCG/ACT inhaler Inhale 2 puffs into the lungs 2 (two) times daily.    . carvedilol (COREG) 3.125 MG tablet Take 3.125 mg by mouth 2 (two) times daily with a meal.    . furosemide (LASIX) 20 MG tablet Take 20 mg by mouth.    . Magnesium Oxide 500 MG CAPS Take 500 mg by mouth daily.    . omeprazole (PRILOSEC) 20 MG capsule Take 20 mg by mouth daily.    . ondansetron (ZOFRAN ODT) 4 MG disintegrating tablet Take 1 tablet (4 mg total) by mouth every 8 (eight) hours as needed for   nausea or vomiting. 10 tablet 1  . sacubitril-valsartan (ENTRESTO) 97-103 MG Take 1 tablet by mouth 2 (two) times daily.     No current facility-administered medications for this visit.     Allergies  Allergen Reactions  . Penicillins     REVIEW OF SYSTEMS (negative unless checked):   Cardiac:  [] Chest pain or chest pressure? [x] Shortness of breath upon activity? [] Shortness of breath when lying flat? [] Irregular heart rhythm?  Vascular:  [x] Pain in calf, thigh, or hip brought on by walking? [] Pain in feet at night that wakes you up from your sleep? [] Blood clot in your veins? [x] Leg swelling?  Pulmonary:  [] Oxygen at home? [x] Productive cough? [] Wheezing?  Neurologic:  [] Sudden weakness in arms or legs? []  Sudden numbness in arms or legs? [] Sudden onset of difficult speaking or slurred speech? [] Temporary loss of vision in one eye? [] Problems with dizziness?  Gastrointestinal:  [] Blood in stool? [] Vomited blood?  Genitourinary:  [] Burning when urinating? [] Blood in urine?  Psychiatric:  [] Major depression  Hematologic:  [] Bleeding problems? [] Problems with blood clotting?  Dermatologic:  [] Rashes or ulcers?  Constitutional:  [] Fever or chills?  Ear/Nose/Throat:  [] Change in hearing? [] Nose bleeds? [] Sore throat?  Musculoskeletal:  [] Back pain? [] Joint pain? [] Muscle pain?   For VQI Use Only   PRE-ADM LIVING Home  AMB STATUS Ambulatory  CAD Sx None  PRIOR CHF None  STRESS TEST No    Physical Examination     Vitals:   03/19/17 1020  BP: 136/81  Pulse: (!) 120  Resp: 16  Temp: 98.5 F (36.9 C)  TempSrc: Oral  SpO2: (!) 88%  Weight: 108 lb (49 kg)  Height: 5' 3" (1.6 m)   Body mass index is 19.13 kg/m.  General Alert, O x 3, WD, NAD  Head [place]/AT,    Ear/Nose/ Throat Hearing grossly intact, nares without erythema or drainage, oropharynx without Erythema or Exudate, Mallampati score: 3,   Eyes PERRLA, EOMI,    Neck Supple, mid-line trachea,    Pulmonary Sym exp, Decreased BLL air movt, faint BLL rales  Cardiac RRR, Nl S1, S2, no Murmurs, No rubs, No S3,S4  Vascular Vessel Right Left  Radial Palpable Palpable  Brachial Palpable Palpable  Carotid Palpable, No Bruit Palpable, No Bruit  Aorta Not palpable N/A  Femoral Palpable Palpable  Popliteal Not palpable Not palpable  PT Not palpable Not palpable  DP Not palpable Not palpable    Gastro- intestinal soft, non-distended, non-tender to palpation, No guarding or rebound, no HSM, no masses, no CVAT B, No palpable prominent aortic pulse,    Musculo- skeletal M/S 5/5 throughout  , Extremities without ischemic changes  , No edema present, No visible varicosities , No  Lipodermatosclerosis present  Neurologic Cranial nerves 2-12 intact , Pain and light touch intact in extremities , Motor exam as listed above  Psychiatric Judgement intact, Mood & affect appropriate for pt's clinical situation  Dermatologic See M/S exam for extremity exam, No rashes otherwise noted  Lymphatic  Palpable lymph nodes: None    Non-Invasive Vascular Imaging   Outside ABI (02/05/17)  R:   ABI: 0.35  L:   ABI: 0.66  Outside BLE Arterial Duplex (02/05/17)  B SFA occlusion  Likely inflow disease to R leg   Outside Studies/Documentation   10 pages of outside documents   were reviewed including: outside cardiology records and above studies.   Medical Decision Making   Wayne [name] is a 61 y.o. male who presents with: RLE critical limb ischemia, moderate LLE PAD   I discussed with the patient the natural history of critical limb ischemia: 25% require amputation in one year, 50% are able to maintain their limbs in one year, and 25-30% die in one year due to comorbidities.  Given the limb threatening status of this patient, I recommend an aggressive work up including proceeding with an: Aortogram, Bilateral runoff and possible intervention. I discussed with the patient the nature of angiographic procedures, especially the limited patencies of any endovascular intervention. The patient is aware of that the risks of an angiographic procedure include but are not limited to: bleeding, infection, access site complications, embolization, rupture of treated vessel, dissection, possible need for emergent surgical intervention, and possible need for surgical procedures to treat the patient's pathology. The patient is aware of the risks and agrees to proceed.  The procedure is scheduled for: 24 JAN 19.  I discussed in depth with the patient the nature of atherosclerosis, and emphasized the importance of maximal medical management including strict control of blood pressure, blood  glucose, and lipid levels, antiplatelet agents, obtaining regular exercise, and cessation of smoking.  The patient is aware that without maximal medical management the underlying atherosclerotic disease process will progress, limiting the benefit of any interventions. The patient is currently not on on statin as not medically indicated.  The patient is currently on an anti-platelet: ASA.  Thank you for allowing us to participate in this patient's care.   Brian Chen, MD, FACS [facility] Office: [phone] Pager: [phone]  03/19/2017, 10:51 AM    

## 2017-03-19 NOTE — H&P (View-Only) (Signed)
  Requested by:  Dr. Duran, MD  Reason for consultation: bilateral leg PAD   History of Present Illness   Wayne [name] is a 62 y.o. (06/03/1955) male w/ known PAD who presents with chief complaint: short distance intermittent claudication.  Onset of symptoms occurred years ago.  This patient has previously been evaluated with PAD and had a L stent placed, though pt is not certain where.  His current sx are short distance intermittent claudication along with neuropathic sx in both feet that improve with position shift.  The patient does NOT describe classic rest pain.  Pain is described as cramping in calf and burning in feet, severity 1-5/10, and associated with static position for neuropathic sx and walking for the cramping.  Patient has attempted to treat this pain with rest.  The patient does not have any foot wounds.  Atherosclerotic risk factors include: DM, HLD, HTN, smoker.  Past Medical History:  Diagnosis Date  . CHF (congestive heart failure) (HCC)   . COPD (chronic obstructive pulmonary disease) (HCC)   . Diabetes mellitus without complication (HCC)   . Hypercholesterolemia   . Hypertension     Past Surgical History: none   Social History   Socioeconomic History  . Marital status: Married    Spouse name: Not on file  . Number of children: Not on file  . Years of education: Not on file  . Highest education level: Not on file  Social Needs  . Financial resource strain: Not on file  . Food insecurity - worry: Not on file  . Food insecurity - inability: Not on file  . Transportation needs - medical: Not on file  . Transportation needs - non-medical: Not on file  Occupational History  . Not on file  Tobacco Use  . Smoking status: Current Every Day Smoker    Packs/day: 1.00    Types: Cigarettes  . Smokeless tobacco: Never Used  . Tobacco comment: 4-5 cigs a day  Substance and Sexual Activity  . Alcohol use: Yes    Comment: weekly  . Drug use: No  . Sexual  activity: Not on file  Other Topics Concern  . Not on file  Social History Narrative  . Not on file    Family History  Problem Relation Age of Onset  . Hypertension Sister   . Hyperlipidemia Sister   . Diabetes Sister     Current Outpatient Medications  Medication Sig Dispense Refill  . acetaminophen (TYLENOL) 500 MG tablet Take 2 tablets (1,000 mg total) by mouth every 6 (six) hours as needed. 30 tablet 0  . albuterol (PROVENTIL HFA;VENTOLIN HFA) 108 (90 Base) MCG/ACT inhaler Inhale 2 puffs into the lungs every 6 (six) hours as needed for wheezing or shortness of breath.    . aspirin EC 81 MG tablet Take 81 mg by mouth daily.    . budesonide-formoterol (SYMBICORT) 160-4.5 MCG/ACT inhaler Inhale 2 puffs into the lungs 2 (two) times daily.    . carvedilol (COREG) 3.125 MG tablet Take 3.125 mg by mouth 2 (two) times daily with a meal.    . furosemide (LASIX) 20 MG tablet Take 20 mg by mouth.    . Magnesium Oxide 500 MG CAPS Take 500 mg by mouth daily.    . omeprazole (PRILOSEC) 20 MG capsule Take 20 mg by mouth daily.    . ondansetron (ZOFRAN ODT) 4 MG disintegrating tablet Take 1 tablet (4 mg total) by mouth every 8 (eight) hours as needed for   nausea or vomiting. 10 tablet 1  . sacubitril-valsartan (ENTRESTO) 97-103 MG Take 1 tablet by mouth 2 (two) times daily.     No current facility-administered medications for this visit.     Allergies  Allergen Reactions  . Penicillins     REVIEW OF SYSTEMS (negative unless checked):   Cardiac:  [] Chest pain or chest pressure? [x] Shortness of breath upon activity? [] Shortness of breath when lying flat? [] Irregular heart rhythm?  Vascular:  [x] Pain in calf, thigh, or hip brought on by walking? [] Pain in feet at night that wakes you up from your sleep? [] Blood clot in your veins? [x] Leg swelling?  Pulmonary:  [] Oxygen at home? [x] Productive cough? [] Wheezing?  Neurologic:  [] Sudden weakness in arms or legs? []  Sudden numbness in arms or legs? [] Sudden onset of difficult speaking or slurred speech? [] Temporary loss of vision in one eye? [] Problems with dizziness?  Gastrointestinal:  [] Blood in stool? [] Vomited blood?  Genitourinary:  [] Burning when urinating? [] Blood in urine?  Psychiatric:  [] Major depression  Hematologic:  [] Bleeding problems? [] Problems with blood clotting?  Dermatologic:  [] Rashes or ulcers?  Constitutional:  [] Fever or chills?  Ear/Nose/Throat:  [] Change in hearing? [] Nose bleeds? [] Sore throat?  Musculoskeletal:  [] Back pain? [] Joint pain? [] Muscle pain?   For VQI Use Only   PRE-ADM LIVING Home  AMB STATUS Ambulatory  CAD Sx None  PRIOR CHF None  STRESS TEST No    Physical Examination     Vitals:   03/19/17 1020  BP: 136/81  Pulse: (!) 120  Resp: 16  Temp: 98.5 F (36.9 C)  TempSrc: Oral  SpO2: (!) 88%  Weight: 108 lb (49 kg)  Height: 5' 3" (1.6 m)   Body mass index is 19.13 kg/m.  General Alert, O x 3, WD, NAD  Head [place]/AT,    Ear/Nose/ Throat Hearing grossly intact, nares without erythema or drainage, oropharynx without Erythema or Exudate, Mallampati score: 3,   Eyes PERRLA, EOMI,    Neck Supple, mid-line trachea,    Pulmonary Sym exp, Decreased BLL air movt, faint BLL rales  Cardiac RRR, Nl S1, S2, no Murmurs, No rubs, No S3,S4  Vascular Vessel Right Left  Radial Palpable Palpable  Brachial Palpable Palpable  Carotid Palpable, No Bruit Palpable, No Bruit  Aorta Not palpable N/A  Femoral Palpable Palpable  Popliteal Not palpable Not palpable  PT Not palpable Not palpable  DP Not palpable Not palpable    Gastro- intestinal soft, non-distended, non-tender to palpation, No guarding or rebound, no HSM, no masses, no CVAT B, No palpable prominent aortic pulse,    Musculo- skeletal M/S 5/5 throughout  , Extremities without ischemic changes  , No edema present, No visible varicosities , No  Lipodermatosclerosis present  Neurologic Cranial nerves 2-12 intact , Pain and light touch intact in extremities , Motor exam as listed above  Psychiatric Judgement intact, Mood & affect appropriate for pt's clinical situation  Dermatologic See M/S exam for extremity exam, No rashes otherwise noted  Lymphatic  Palpable lymph nodes: None    Non-Invasive Vascular Imaging   Outside ABI (02/05/17)  R:   ABI: 0.35  L:   ABI: 0.66  Outside BLE Arterial Duplex (02/05/17)  B SFA occlusion  Likely inflow disease to R leg   Outside Studies/Documentation   10 pages of outside documents   were reviewed including: outside cardiology records and above studies.   Medical Decision Making   Wayne [name] is a 62 y.o. male who presents with: RLE critical limb ischemia, moderate LLE PAD   I discussed with the patient the natural history of critical limb ischemia: 25% require amputation in one year, 50% are able to maintain their limbs in one year, and 25-30% die in one year due to comorbidities.  Given the limb threatening status of this patient, I recommend an aggressive work up including proceeding with an: Aortogram, Bilateral runoff and possible intervention. I discussed with the patient the nature of angiographic procedures, especially the limited patencies of any endovascular intervention. The patient is aware of that the risks of an angiographic procedure include but are not limited to: bleeding, infection, access site complications, embolization, rupture of treated vessel, dissection, possible need for emergent surgical intervention, and possible need for surgical procedures to treat the patient's pathology. The patient is aware of the risks and agrees to proceed.  The procedure is scheduled for: 24 JAN 19.  I discussed in depth with the patient the nature of atherosclerosis, and emphasized the importance of maximal medical management including strict control of blood pressure, blood  glucose, and lipid levels, antiplatelet agents, obtaining regular exercise, and cessation of smoking.  The patient is aware that without maximal medical management the underlying atherosclerotic disease process will progress, limiting the benefit of any interventions. The patient is currently not on on statin as not medically indicated.  The patient is currently on an anti-platelet: ASA.  Thank you for allowing us to participate in this patient's care.   Brian Chen, MD, FACS [facility] Office: [phone] Pager: [phone]  03/19/2017, 10:51 AM    

## 2017-03-22 DIAGNOSIS — J410 Simple chronic bronchitis: Secondary | ICD-10-CM | POA: Insufficient documentation

## 2017-03-22 DIAGNOSIS — I739 Peripheral vascular disease, unspecified: Secondary | ICD-10-CM | POA: Insufficient documentation

## 2017-03-23 ENCOUNTER — Encounter: Payer: Self-pay | Admitting: Cardiology

## 2017-04-01 ENCOUNTER — Encounter (HOSPITAL_COMMUNITY): Payer: Self-pay | Admitting: Vascular Surgery

## 2017-04-01 ENCOUNTER — Other Ambulatory Visit: Payer: Self-pay

## 2017-04-01 ENCOUNTER — Ambulatory Visit (HOSPITAL_COMMUNITY)
Admission: RE | Disposition: A | Discharge status: Home / Self Care | Payer: Self-pay | Source: Ambulatory Visit | Attending: Vascular Surgery

## 2017-04-01 ENCOUNTER — Ambulatory Visit (HOSPITAL_COMMUNITY)
Admission: RE | Admit: 2017-04-01 | Discharge: 2017-04-01 | Disposition: A | Discharge status: Home / Self Care | Payer: Medicaid Other | Source: Ambulatory Visit | Attending: Vascular Surgery | Admitting: Vascular Surgery

## 2017-04-01 DIAGNOSIS — J449 Chronic obstructive pulmonary disease, unspecified: Secondary | ICD-10-CM | POA: Diagnosis not present

## 2017-04-01 DIAGNOSIS — I70213 Atherosclerosis of native arteries of extremities with intermittent claudication, bilateral legs: Secondary | ICD-10-CM | POA: Diagnosis not present

## 2017-04-01 DIAGNOSIS — I110 Hypertensive heart disease with heart failure: Secondary | ICD-10-CM | POA: Insufficient documentation

## 2017-04-01 DIAGNOSIS — I998 Other disorder of circulatory system: Secondary | ICD-10-CM | POA: Diagnosis present

## 2017-04-01 DIAGNOSIS — I70229 Atherosclerosis of native arteries of extremities with rest pain, unspecified extremity: Secondary | ICD-10-CM | POA: Diagnosis present

## 2017-04-01 DIAGNOSIS — I509 Heart failure, unspecified: Secondary | ICD-10-CM | POA: Diagnosis not present

## 2017-04-01 DIAGNOSIS — E1151 Type 2 diabetes mellitus with diabetic peripheral angiopathy without gangrene: Secondary | ICD-10-CM | POA: Insufficient documentation

## 2017-04-01 DIAGNOSIS — Z7951 Long term (current) use of inhaled steroids: Secondary | ICD-10-CM | POA: Diagnosis not present

## 2017-04-01 DIAGNOSIS — Z79899 Other long term (current) drug therapy: Secondary | ICD-10-CM | POA: Insufficient documentation

## 2017-04-01 DIAGNOSIS — E7800 Pure hypercholesterolemia, unspecified: Secondary | ICD-10-CM | POA: Diagnosis not present

## 2017-04-01 DIAGNOSIS — Z880 Allergy status to penicillin: Secondary | ICD-10-CM | POA: Diagnosis not present

## 2017-04-01 DIAGNOSIS — Z7982 Long term (current) use of aspirin: Secondary | ICD-10-CM | POA: Diagnosis not present

## 2017-04-01 DIAGNOSIS — I739 Peripheral vascular disease, unspecified: Secondary | ICD-10-CM | POA: Diagnosis present

## 2017-04-01 DIAGNOSIS — F17210 Nicotine dependence, cigarettes, uncomplicated: Secondary | ICD-10-CM | POA: Diagnosis not present

## 2017-04-01 DIAGNOSIS — I700 Atherosclerosis of aorta: Secondary | ICD-10-CM

## 2017-04-01 HISTORY — PX: ABDOMINAL AORTOGRAM W/LOWER EXTREMITY: CATH118223

## 2017-04-01 LAB — GLUCOSE, CAPILLARY: GLUCOSE-CAPILLARY: 91 mg/dL (ref 65–99)

## 2017-04-01 LAB — POCT I-STAT, CHEM 8
BUN: 12 mg/dL (ref 6–20)
CALCIUM ION: 1.19 mmol/L (ref 1.15–1.40)
Chloride: 98 mmol/L — ABNORMAL LOW (ref 101–111)
Creatinine, Ser: 0.9 mg/dL (ref 0.61–1.24)
GLUCOSE: 96 mg/dL (ref 65–99)
HCT: 53 % — ABNORMAL HIGH (ref 39.0–52.0)
HEMOGLOBIN: 18 g/dL — AB (ref 13.0–17.0)
POTASSIUM: 4.1 mmol/L (ref 3.5–5.1)
Sodium: 135 mmol/L (ref 135–145)
TCO2: 27 mmol/L (ref 22–32)

## 2017-04-01 SURGERY — ABDOMINAL AORTOGRAM W/LOWER EXTREMITY
Anesthesia: LOCAL

## 2017-04-01 MED ORDER — SODIUM CHLORIDE 0.9 % IV SOLN: 250.0000 mL | INTRAVENOUS | Status: DC | PRN

## 2017-04-01 MED ORDER — SODIUM CHLORIDE 0.9 % WEIGHT BASED INFUSION: 1.0000 mL/kg/h | INTRAVENOUS | Status: DC

## 2017-04-01 MED ORDER — LABETALOL HCL 5 MG/ML IV SOLN: 10.0000 mg | INTRAVENOUS | Status: DC | PRN

## 2017-04-01 MED ORDER — HYDRALAZINE HCL 20 MG/ML IJ SOLN: 5.0000 mg | INTRAMUSCULAR | Status: DC | PRN

## 2017-04-01 MED ORDER — LIDOCAINE HCL (PF) 1 % IJ SOLN
INTRAMUSCULAR | Status: DC | PRN
  Administered 2017-04-01: 18 mL

## 2017-04-01 MED ORDER — IODIXANOL 320 MG/ML IV SOLN
INTRAVENOUS | Status: DC | PRN
  Administered 2017-04-01: 111 mL via INTRA_ARTERIAL

## 2017-04-01 MED ORDER — SODIUM CHLORIDE 0.9% FLUSH: 3.0000 mL | INTRAVENOUS | Status: DC | PRN

## 2017-04-01 MED ORDER — HEPARIN (PORCINE) IN NACL 2-0.9 UNIT/ML-% IJ SOLN
INTRAMUSCULAR | Status: AC
  Filled 2017-04-01: qty 1000

## 2017-04-01 MED ORDER — HEPARIN (PORCINE) IN NACL 2-0.9 UNIT/ML-% IJ SOLN
INTRAMUSCULAR | Status: AC | PRN
  Administered 2017-04-01: 1000 mL

## 2017-04-01 MED ORDER — SODIUM CHLORIDE 0.9% FLUSH: 3.0000 mL | Freq: Two times a day (BID) | INTRAVENOUS | Status: DC

## 2017-04-01 MED ORDER — LIDOCAINE HCL 1 % IJ SOLN
INTRAMUSCULAR | Status: AC
  Filled 2017-04-01: qty 20

## 2017-04-01 MED ORDER — SODIUM CHLORIDE 0.9 % IV SOLN
INTRAVENOUS | Status: DC
  Administered 2017-04-01: 07:00:00 via INTRAVENOUS

## 2017-04-01 SURGICAL SUPPLY — 12 items
CATH ANGIO 5F BER2 65CM (CATHETERS) ×2 IMPLANT
CATH OMNI FLUSH 5F 65CM (CATHETERS) ×2 IMPLANT
COVER PRB 48X5XTLSCP FOLD TPE (BAG) ×1 IMPLANT
COVER PROBE 5X48 (BAG) ×1
KIT MICROINTRODUCER STIFF 5F (SHEATH) ×2 IMPLANT
KIT PV (KITS) ×2 IMPLANT
SHEATH PINNACLE 5F 10CM (SHEATH) ×2 IMPLANT
SYR MEDRAD MARK V 150ML (SYRINGE) ×2 IMPLANT
TRANSDUCER W/STOPCOCK (MISCELLANEOUS) ×2 IMPLANT
TRAY PV CATH (CUSTOM PROCEDURE TRAY) ×2 IMPLANT
WIRE BENTSON .035X145CM (WIRE) ×2 IMPLANT
WIRE MICROINTRODUCER 60CM (WIRE) ×2 IMPLANT

## 2017-04-01 NOTE — Interval H&P Note (Signed)
   History and Physical Update  The patient was interviewed and re-examined.  The patient's previous History and Physical has been reviewed and is unchanged from my consult.  There is no change in the plan of care: aortogram, bilateral leg runoff, and possible intervention.   I discussed with the patient the nature of angiographic procedures, especially the limited patencies of any endovascular intervention.    The patient is aware of that the risks of an angiographic procedure include but are not limited to: bleeding, infection, access site complications, renal failure, embolization, rupture of vessel, dissection, arteriovenous fistula, possible need for emergent surgical intervention, possible need for surgical procedures to treat the patient's pathology, anaphylactic reaction to contrast, and stroke and death.    The patient is aware of the risks and agrees to proceed.   Leonides Sake, MD, FACS [facility] [place] Office: [phone] Pager: [phone]  04/01/2017, 7:39 AM

## 2017-04-01 NOTE — Progress Notes (Signed)
Site area: left groin fa sheath Site Prior to Removal:  Level 0 Pressure Applied For: 20 minutes Manual:   yes Patient Status During Pull:  stable Post Pull Site:  Level 0 Post Pull Instructions Given:  yes Post Pull Pulses Present: dopplered Dressing Applied:  Gauze and tegaderm Bedrest begins @ 0910 Comments:

## 2017-04-01 NOTE — Discharge Instructions (Signed)

## 2017-04-01 NOTE — Op Note (Signed)
OPERATIVE NOTE   PROCEDURE: 1.  Left common femoral artery cannulation under ultrasound guidance 2.  Placement of catheter in aorta 3.  Aortogram  PRE-OPERATIVE DIAGNOSIS: right critical limb ischemia, left moderate peripheral arterial disease   POST-OPERATIVE DIAGNOSIS: same as above   SURGEON: Leonides Sake, MD  ANESTHESIA: conscious sedation  ESTIMATED BLOOD LOSS: 50 cc  CONTRAST: 110 cc  FINDING(S):  Aorta: patent, heavily calcified, distal tapering aorta  Superior mesenteric artery: not visualized Celiac artery: proximal not seen, branches patent   Right Left  RA patent patent  CIA Occluded, prior stent occluded Occluded, serial sub-total occlusion, heavily calcified  EIA Occluded Patent, heavily calcified and small  IIA Occluded Occluded  CFA Not visualized Patent, calcified  SFA Occluded, heavily calcification evident Occluded, heavily calcification evident  PFA One branch reconstituted by sacral or large lumbar artery Patent  Pop Disease above-the-knee popliteal artery reconstitutes from collaterals, patent Profunda collaterals reconstitutes above-the-knee popliteal artery, patent  Trif Patent proximally, washes out distally Patent proximally, washes out distally  AT Patent proximally, washes out distally Patent proximally, washes out distally  Pero Patent proximally, washes out distally Patent proximally, washes out distally  PT Patent proximally, washes out distally Patent proximally, washes out distally   SPECIMEN(S):  none  INDICATIONS:   DEIONTE [name] is a 62 y.o. male who presents with ABI consistent with R leg critical limb ischemia and left moderate PAD.  The patient presents for: aortogram, bilateral leg runoff, and possible intervention.  I discussed with the patient the nature of angiographic procedures, especially the limited patencies of any endovascular intervention.  The patient is aware of that the risks of an angiographic procedure include but  are not limited to: bleeding, infection, access site complications, renal failure, embolization, rupture of vessel, dissection, possible need for emergent surgical intervention, possible need for surgical procedures to treat the patient's pathology, and stroke and death.  The patient is aware of the risks and agrees to proceed.  DESCRIPTION: After full informed consent was obtained from the patient, the patient was brought back to the angiography suite.  The patient was placed supine upon the angiography table and connected to cardiopulmonary monitoring equipment.  The patient was then given conscious sedation, the amounts of which are documented in the patient's chart.  A circulating radiologic technician maintained continuous monitoring of the patient's cardiopulmonary status.  Additionally, the control room radiologic technician provided backup monitoring throughout the procedure.  The patient was prepped and drape in the standard fashion for an angiographic procedure.  At this point, attention was turned to the left groin.  There was superficial subcutaneous mass was evident in the left groin.  Under ultrasound, it appeared to be solid and consistent with an epidermal inclusion cyst.  Under ultrasound guidance, the subcutaneous tissue surrounding the left common femoral artery was anesthesized with 1% lidocaine with epinephrine.  The artery was then cannulated with a micropuncture needle, adjacent to the cyst.  The wire would not easily advance, rather rolling up.  I removed the wire and needle and held pressure for 3 minutes.  I recannulated the left common femoral artery under ultrasound guidance.  The microwire was advanced into the iliac arterial system.  The needle was exchanged for a microsheath, which was loaded into the common femoral artery over the wire.  The microwire was exchanged for a Bentson wire which was advanced into the aorta.  The microsheath was then exchanged for a 5-Fr sheath  which was loaded into  the common femoral artery.  The wire would not pass the distal aorta.  I loaded the Omniflush catheter over the wire and tried to advance the wire.  The wire would not advance, so I exchanged the catheter for a BER-2 catheter.  Using this combination, I got into the infrarenal abdominal aorta.  I exchanged the catheter for an Omniflush catheter.  The Omniflush catheter was then loaded over the wire up to the level of L1.  The catheter was connected to the power injector circuit.  After de-airring and de-clotting the circuit, a power injector aortogram was completed.    I then pulled the catheter distally, just proximal to the lumbar or sacral artery.  I did a magnified 30 degree LAO pelvic injection.  This demonstrated clearly the above findings.  I did not feel stenting was likely to have a meaningful long-term outcome.  An automated bilateral leg runoff at antero-posterior orientation was completed.  The findings are listed above.  Based on the findings, pt needs: cardiology evaluation, possible aortobifemoral bypass.   COMPLICATIONS: none  CONDITION: stable   Leonides Sake, MD, [facility] [place] Office: [phone] Pager: [phone]  04/01/2017, 8:44 AM

## 2017-04-02 ENCOUNTER — Telehealth: Payer: Self-pay | Admitting: Vascular Surgery

## 2017-04-02 ENCOUNTER — Other Ambulatory Visit: Payer: Self-pay

## 2017-04-02 MED FILL — Lidocaine HCl Local Inj 1%: INTRAMUSCULAR | Qty: 20 | Status: AC

## 2017-04-02 NOTE — Telephone Encounter (Signed)
Sched cardiac clearance appt 04/05/17 at 9:20 at [facility] [place] [facility] with Dr. Creta Levin CTA 04/28/17 at 11:30 at [facility] 315.  Sched [facility] 04/28/17 at 1:45.  Left a message on the pt's hm# giving him times, dates,  Addresses, phone numbers, and instructions for all appointments.

## 2017-04-02 NOTE — Telephone Encounter (Signed)
An Epic error erased this staff message from all in and out boxes.   Transcription of original message from hard copy:  From: Fransisco Hertz, MD Sent: 04/01/17 8:56 AM To: [facility] [address]  [name] [phone] 1955-06-27  PROCEDURE: 1. Left common femoral artery cannulation under ultrasound guidance 2. Placement of catheter in aorta 3. Aortogram  Follow-up: 4 weeks  Order(s) for follow-up: 1. CTA abd/pelvis: re: extensive aortic atherosclerosis, preop planning for ABF 2. Cardiology risk stratification and optimization for aortobifemoral bypass

## 2017-04-05 ENCOUNTER — Ambulatory Visit: Payer: Medicaid Other | Admitting: Cardiology

## 2017-04-05 ENCOUNTER — Telehealth: Payer: Self-pay | Admitting: *Deleted

## 2017-04-05 NOTE — Telephone Encounter (Signed)
Left message for patient to call office back to schedule excision of cyst with Dr. Imogene Burn.

## 2017-04-06 ENCOUNTER — Encounter (HOSPITAL_COMMUNITY): Payer: Self-pay

## 2017-04-06 ENCOUNTER — Encounter: Payer: Self-pay | Admitting: Vascular Surgery

## 2017-04-07 ENCOUNTER — Other Ambulatory Visit: Payer: Self-pay | Admitting: *Deleted

## 2017-04-07 ENCOUNTER — Telehealth: Payer: Self-pay | Admitting: Vascular Surgery

## 2017-04-07 ENCOUNTER — Telehealth: Payer: Self-pay | Admitting: *Deleted

## 2017-04-07 NOTE — Telephone Encounter (Signed)
Pt cxl'd appt with Dr. Josiah Lobo. Pt sees Dr. Judie Petit at [facility] [phone]. Pt saw Dr. Judie Petit 04/06/17. He sent cardiac clearance to our office.

## 2017-04-07 NOTE — Telephone Encounter (Signed)
Call to patient instructed to be at [facility] admitting department at 7 am on 04/13/2017 for surgery (excision of cyst left groin) NO food or drink past MN 04/12/17. Follow the detailed instructions from pre-admission testing about this surgery. And expect their call. Will need driver home.

## 2017-04-07 NOTE — Telephone Encounter (Signed)
Patient called back agreeable to schedule surgery for excision of cyst. Will arrange with Dr. Imogene Burn first available and then call him back with details.

## 2017-04-12 ENCOUNTER — Telehealth: Payer: Self-pay | Admitting: *Deleted

## 2017-04-12 ENCOUNTER — Other Ambulatory Visit: Payer: Self-pay

## 2017-04-12 ENCOUNTER — Encounter (HOSPITAL_COMMUNITY): Payer: Self-pay | Admitting: *Deleted

## 2017-04-12 NOTE — Progress Notes (Signed)
Spoke with pt for pre-op call. Pt states he's been told he's had an MI in the past, but he states he doesn't think he did. Pt has hx of CHF and COPD. Pt states he sees Arnette Felts, [place]. Pt states he's Pre-diabetic. States he does not check his blood sugar and doesn't know what his A1C is. He states he thinks he gets it done at Dr. Elie Confer office. I called and they do not have an A1C result on pt.

## 2017-04-12 NOTE — Telephone Encounter (Signed)
Verified with Tamika for transportation that patient is for surgery 04/13/17 to be at hospital at 8am.

## 2017-04-13 ENCOUNTER — Ambulatory Visit (HOSPITAL_COMMUNITY): Payer: Medicaid Other | Admitting: Certified Registered Nurse Anesthetist

## 2017-04-13 ENCOUNTER — Encounter (HOSPITAL_COMMUNITY): Payer: Self-pay | Admitting: Certified Registered Nurse Anesthetist

## 2017-04-13 ENCOUNTER — Ambulatory Visit (HOSPITAL_COMMUNITY)
Admission: RE | Admit: 2017-04-13 | Discharge: 2017-04-13 | Disposition: A | Discharge status: Home / Self Care | Payer: Medicaid Other | Source: Ambulatory Visit | Attending: Vascular Surgery | Admitting: Vascular Surgery

## 2017-04-13 ENCOUNTER — Encounter (HOSPITAL_COMMUNITY)
Admission: RE | Disposition: A | Discharge status: Home / Self Care | Payer: Self-pay | Source: Ambulatory Visit | Attending: Vascular Surgery

## 2017-04-13 DIAGNOSIS — I700 Atherosclerosis of aorta: Secondary | ICD-10-CM | POA: Insufficient documentation

## 2017-04-13 DIAGNOSIS — Z880 Allergy status to penicillin: Secondary | ICD-10-CM | POA: Diagnosis not present

## 2017-04-13 DIAGNOSIS — I708 Atherosclerosis of other arteries: Secondary | ICD-10-CM | POA: Diagnosis not present

## 2017-04-13 DIAGNOSIS — L720 Epidermal cyst: Secondary | ICD-10-CM | POA: Diagnosis present

## 2017-04-13 DIAGNOSIS — I110 Hypertensive heart disease with heart failure: Secondary | ICD-10-CM | POA: Diagnosis not present

## 2017-04-13 DIAGNOSIS — K219 Gastro-esophageal reflux disease without esophagitis: Secondary | ICD-10-CM | POA: Insufficient documentation

## 2017-04-13 DIAGNOSIS — I509 Heart failure, unspecified: Secondary | ICD-10-CM | POA: Insufficient documentation

## 2017-04-13 DIAGNOSIS — I998 Other disorder of circulatory system: Secondary | ICD-10-CM | POA: Diagnosis not present

## 2017-04-13 DIAGNOSIS — J449 Chronic obstructive pulmonary disease, unspecified: Secondary | ICD-10-CM | POA: Diagnosis not present

## 2017-04-13 DIAGNOSIS — Z79899 Other long term (current) drug therapy: Secondary | ICD-10-CM | POA: Insufficient documentation

## 2017-04-13 DIAGNOSIS — I739 Peripheral vascular disease, unspecified: Secondary | ICD-10-CM | POA: Insufficient documentation

## 2017-04-13 DIAGNOSIS — E7800 Pure hypercholesterolemia, unspecified: Secondary | ICD-10-CM | POA: Diagnosis not present

## 2017-04-13 DIAGNOSIS — E785 Hyperlipidemia, unspecified: Secondary | ICD-10-CM | POA: Diagnosis not present

## 2017-04-13 DIAGNOSIS — Z7982 Long term (current) use of aspirin: Secondary | ICD-10-CM | POA: Insufficient documentation

## 2017-04-13 DIAGNOSIS — E119 Type 2 diabetes mellitus without complications: Secondary | ICD-10-CM | POA: Diagnosis not present

## 2017-04-13 DIAGNOSIS — F17210 Nicotine dependence, cigarettes, uncomplicated: Secondary | ICD-10-CM | POA: Diagnosis not present

## 2017-04-13 HISTORY — DX: Acute myocardial infarction, unspecified: I21.9

## 2017-04-13 HISTORY — PX: MASS EXCISION: SHX2000

## 2017-04-13 HISTORY — DX: Unspecified osteoarthritis, unspecified site: M19.90

## 2017-04-13 HISTORY — DX: Prediabetes: R73.03

## 2017-04-13 HISTORY — DX: Gastro-esophageal reflux disease without esophagitis: K21.9

## 2017-04-13 HISTORY — DX: Peripheral vascular disease, unspecified: I73.9

## 2017-04-13 LAB — URINALYSIS, ROUTINE W REFLEX MICROSCOPIC
BILIRUBIN URINE: NEGATIVE
Bacteria, UA: NONE SEEN
Glucose, UA: NEGATIVE mg/dL
KETONES UR: NEGATIVE mg/dL
Leukocytes, UA: NEGATIVE
NITRITE: NEGATIVE
PROTEIN: NEGATIVE mg/dL
Specific Gravity, Urine: 1.001 — ABNORMAL LOW (ref 1.005–1.030)
Squamous Epithelial / LPF: NONE SEEN
pH: 6 (ref 5.0–8.0)

## 2017-04-13 LAB — COMPREHENSIVE METABOLIC PANEL
ALBUMIN: 3.3 g/dL — AB (ref 3.5–5.0)
ALT: 16 U/L — AB (ref 17–63)
ANION GAP: 14 (ref 5–15)
AST: 28 U/L (ref 15–41)
Alkaline Phosphatase: 100 U/L (ref 38–126)
BILIRUBIN TOTAL: 0.8 mg/dL (ref 0.3–1.2)
BUN: 5 mg/dL — AB (ref 6–20)
CO2: 20 mmol/L — AB (ref 22–32)
Calcium: 9.1 mg/dL (ref 8.9–10.3)
Chloride: 99 mmol/L — ABNORMAL LOW (ref 101–111)
Creatinine, Ser: 0.83 mg/dL (ref 0.61–1.24)
GFR calc Af Amer: >60 mL/min (ref >60)
GFR calc non Af Amer: >60 mL/min (ref >60)
GLUCOSE: 84 mg/dL (ref 65–99)
Potassium: 4.2 mmol/L (ref 3.5–5.1)
SODIUM: 133 mmol/L — AB (ref 135–145)
Total Protein: 7.3 g/dL (ref 6.5–8.1)

## 2017-04-13 LAB — CBC
HEMATOCRIT: 47.7 % (ref 39.0–52.0)
Hemoglobin: 16.5 g/dL (ref 13.0–17.0)
MCH: 34.8 pg — ABNORMAL HIGH (ref 26.0–34.0)
MCHC: 34.6 g/dL (ref 30.0–36.0)
MCV: 100.6 fL — ABNORMAL HIGH (ref 78.0–100.0)
Platelets: 294 10*3/uL (ref 150–400)
RBC: 4.74 MIL/uL (ref 4.22–5.81)
RDW: 15.3 % (ref 11.5–15.5)
WBC: 5.4 10*3/uL (ref 4.0–10.5)

## 2017-04-13 LAB — GLUCOSE, CAPILLARY
GLUCOSE-CAPILLARY: 101 mg/dL — AB (ref 65–99)
Glucose-Capillary: 77 mg/dL (ref 65–99)

## 2017-04-13 LAB — SURGICAL PCR SCREEN
MRSA, PCR: NEGATIVE
Staphylococcus aureus: NEGATIVE

## 2017-04-13 LAB — HEMOGLOBIN A1C
Hgb A1c MFr Bld: 6.1 % — ABNORMAL HIGH (ref 4.8–5.6)
MEAN PLASMA GLUCOSE: 128.37 mg/dL

## 2017-04-13 SURGERY — EXCISION MASS
Anesthesia: Monitor Anesthesia Care | Site: Groin | Laterality: Left

## 2017-04-13 MED ORDER — MUPIROCIN 2 % EX OINT
1.0000 "application " | TOPICAL_OINTMENT | Freq: Once | CUTANEOUS | Status: AC
  Administered 2017-04-13: 1 via TOPICAL
  Filled 2017-04-13: qty 22

## 2017-04-13 MED ORDER — MIDAZOLAM HCL 2 MG/2ML IJ SOLN
INTRAMUSCULAR | Status: AC
  Filled 2017-04-13: qty 2

## 2017-04-13 MED ORDER — PROTAMINE SULFATE 10 MG/ML IV SOLN
INTRAVENOUS | Status: AC
  Filled 2017-04-13: qty 10

## 2017-04-13 MED ORDER — 0.9 % SODIUM CHLORIDE (POUR BTL) OPTIME
TOPICAL | Status: DC | PRN
  Administered 2017-04-13: 1000 mL

## 2017-04-13 MED ORDER — SUGAMMADEX SODIUM 200 MG/2ML IV SOLN
INTRAVENOUS | Status: AC
  Filled 2017-04-13: qty 2

## 2017-04-13 MED ORDER — CHLORHEXIDINE GLUCONATE 4 % EX LIQD: 60.0000 mL | Freq: Once | CUTANEOUS | Status: DC

## 2017-04-13 MED ORDER — LIDOCAINE-EPINEPHRINE (PF) 1 %-1:200000 IJ SOLN
INTRAMUSCULAR | Status: DC | PRN
  Administered 2017-04-13: 7 mL

## 2017-04-13 MED ORDER — LIDOCAINE 2% (20 MG/ML) 5 ML SYRINGE
INTRAMUSCULAR | Status: AC
  Filled 2017-04-13: qty 5

## 2017-04-13 MED ORDER — FENTANYL CITRATE (PF) 250 MCG/5ML IJ SOLN
INTRAMUSCULAR | Status: AC
  Filled 2017-04-13: qty 5

## 2017-04-13 MED ORDER — ONDANSETRON HCL 4 MG/2ML IJ SOLN
INTRAMUSCULAR | Status: AC
  Filled 2017-04-13: qty 2

## 2017-04-13 MED ORDER — DEXAMETHASONE SODIUM PHOSPHATE 10 MG/ML IJ SOLN
INTRAMUSCULAR | Status: AC
  Filled 2017-04-13: qty 1

## 2017-04-13 MED ORDER — ROCURONIUM BROMIDE 10 MG/ML (PF) SYRINGE
PREFILLED_SYRINGE | INTRAVENOUS | Status: AC
  Filled 2017-04-13: qty 5

## 2017-04-13 MED ORDER — VANCOMYCIN HCL 1000 MG IV SOLR
INTRAVENOUS | Status: DC | PRN
  Administered 2017-04-13: 1000 mg via INTRAVENOUS

## 2017-04-13 MED ORDER — VANCOMYCIN HCL IN DEXTROSE 1-5 GM/200ML-% IV SOLN
1000.0000 mg | INTRAVENOUS | Status: DC
  Filled 2017-04-13: qty 200

## 2017-04-13 MED ORDER — HEPARIN SODIUM (PORCINE) 1000 UNIT/ML IJ SOLN
INTRAMUSCULAR | Status: AC
  Filled 2017-04-13: qty 4

## 2017-04-13 MED ORDER — ONDANSETRON HCL 4 MG/2ML IJ SOLN
INTRAMUSCULAR | Status: DC | PRN
  Administered 2017-04-13: 4 mg via INTRAVENOUS

## 2017-04-13 MED ORDER — MIDAZOLAM HCL 2 MG/2ML IJ SOLN
INTRAMUSCULAR | Status: DC | PRN
  Administered 2017-04-13: 1 mg via INTRAVENOUS

## 2017-04-13 MED ORDER — LIDOCAINE-EPINEPHRINE (PF) 1 %-1:200000 IJ SOLN
INTRAMUSCULAR | Status: AC
  Filled 2017-04-13: qty 30

## 2017-04-13 MED ORDER — SODIUM CHLORIDE 0.9 % IV SOLN
INTRAVENOUS | Status: DC
  Administered 2017-04-13: 09:00:00 via INTRAVENOUS

## 2017-04-13 MED ORDER — PROPOFOL 10 MG/ML IV BOLUS
INTRAVENOUS | Status: AC
  Filled 2017-04-13: qty 20

## 2017-04-13 MED ORDER — FENTANYL CITRATE (PF) 100 MCG/2ML IJ SOLN
INTRAMUSCULAR | Status: DC | PRN
  Administered 2017-04-13: 50 ug via INTRAVENOUS
  Administered 2017-04-13: 25 ug via INTRAVENOUS

## 2017-04-13 SURGICAL SUPPLY — 48 items
BAG DECANTER FOR FLEXI CONT (MISCELLANEOUS) IMPLANT
CANISTER SUCT 3000ML PPV (MISCELLANEOUS) ×3 IMPLANT
CATH ROBINSON RED A/P 18FR (CATHETERS) IMPLANT
CATH SUCT 10FR WHISTLE TIP (CATHETERS) IMPLANT
CLIP VESOCCLUDE MED 24/CT (CLIP) ×3 IMPLANT
CLIP VESOCCLUDE SM WIDE 24/CT (CLIP) IMPLANT
COVER PROBE W GEL 5X96 (DRAPES) IMPLANT
COVER SURGICAL LIGHT HANDLE (MISCELLANEOUS) ×3 IMPLANT
CRADLE DONUT ADULT HEAD (MISCELLANEOUS) IMPLANT
DERMABOND ADVANCED (GAUZE/BANDAGES/DRESSINGS) ×2
DERMABOND ADVANCED .7 DNX12 (GAUZE/BANDAGES/DRESSINGS) ×1 IMPLANT
DRAPE UNIVERSAL PACK (DRAPES) ×3 IMPLANT
ELECT CAUTERY BLADE 6.4 (BLADE) ×3 IMPLANT
ELECT REM PT RETURN 9FT ADLT (ELECTROSURGICAL) ×3
ELECTRODE REM PT RTRN 9FT ADLT (ELECTROSURGICAL) ×1 IMPLANT
GLOVE BIO SURGEON STRL SZ7 (GLOVE) ×3 IMPLANT
GLOVE BIOGEL PI IND STRL 7.5 (GLOVE) ×1 IMPLANT
GLOVE BIOGEL PI INDICATOR 7.5 (GLOVE) ×2
GOWN STRL REUS W/ TWL LRG LVL3 (GOWN DISPOSABLE) ×3 IMPLANT
GOWN STRL REUS W/TWL LRG LVL3 (GOWN DISPOSABLE) ×6
KIT BASIN OR (CUSTOM PROCEDURE TRAY) ×3 IMPLANT
KIT ROOM TURNOVER OR (KITS) ×3 IMPLANT
NS IRRIG 1000ML POUR BTL (IV SOLUTION) ×6 IMPLANT
PACK CAROTID (CUSTOM PROCEDURE TRAY) IMPLANT
PACK GENERAL/GYN (CUSTOM PROCEDURE TRAY) ×3 IMPLANT
PAD ARMBOARD 7.5X6 YLW CONV (MISCELLANEOUS) ×6 IMPLANT
PENCIL BUTTON HOLSTER BLD 10FT (ELECTRODE) ×3 IMPLANT
SET COLLECT BLD 21X3/4 12 (NEEDLE) IMPLANT
SET COLLECT BLD 21X3/4 12 PB (MISCELLANEOUS) IMPLANT
SHUNT CAROTID BYPASS 10 (VASCULAR PRODUCTS) IMPLANT
SHUNT CAROTID BYPASS 12FRX15.5 (VASCULAR PRODUCTS) IMPLANT
STOPCOCK 4 WAY LG BORE MALE ST (IV SETS) IMPLANT
SUT ETHILON 3 0 PS 1 (SUTURE) IMPLANT
SUT MNCRL AB 4-0 PS2 18 (SUTURE) ×3 IMPLANT
SUT PROLENE 6 0 BV (SUTURE) IMPLANT
SUT PROLENE 7 0 BV 1 (SUTURE) IMPLANT
SUT SILK 3 0 TIES 17X18 (SUTURE)
SUT SILK 3-0 18XBRD TIE BLK (SUTURE) IMPLANT
SUT VIC AB 3-0 SH 27 (SUTURE) ×2
SUT VIC AB 3-0 SH 27X BRD (SUTURE) ×1 IMPLANT
SYR TB 1ML LUER SLIP (SYRINGE) IMPLANT
SYSTEM CHEST DRAIN TLS 7FR (DRAIN) IMPLANT
TOWEL GREEN STERILE (TOWEL DISPOSABLE) ×3 IMPLANT
TRAY FOLEY MTR SLVR 16FR STAT (CATHETERS) IMPLANT
TUBE SUCT ARGYLE STRL (TUBING) ×3 IMPLANT
TUBING ART PRESS 48 MALE/FEM (TUBING) IMPLANT
TUBING EXTENTION W/L.L. (IV SETS) IMPLANT
WATER STERILE IRR 1000ML POUR (IV SOLUTION) IMPLANT

## 2017-04-13 NOTE — Anesthesia Postprocedure Evaluation (Signed)
Anesthesia Post Note  Patient: Wayne [name]  Procedure(s) Performed: EXCISION EPIDERMAL INCLUSION CYST LEFT GROIN (Left Groin)     Patient location during evaluation: PACU Anesthesia Type: MAC Level of consciousness: awake and alert Pain management: pain level controlled Vital Signs Assessment: post-procedure vital signs reviewed and stable Respiratory status: spontaneous breathing, nonlabored ventilation, respiratory function stable and patient connected to nasal cannula oxygen Cardiovascular status: blood pressure returned to baseline and stable Postop Assessment: no apparent nausea or vomiting Anesthetic complications: no    Last Vitals:  Vitals:   04/13/17 1313 04/13/17 1325  BP:  121/74  Pulse: (!) 107 93  Resp: 15 15  Temp:    SpO2: 90% 92%    Last Pain:  Vitals:   04/13/17 0843  TempSrc: Oral                 Ki Corbo DAVID

## 2017-04-13 NOTE — Transfer of Care (Signed)
Immediate Anesthesia Transfer of Care Note  Patient: Wayne [name]  Procedure(s) Performed: EXCISION EPIDERMAL INCLUSION CYST LEFT GROIN (Left Groin)  Patient Location: PACU  Anesthesia Type:MAC  Level of Consciousness: awake, alert , oriented, patient cooperative and responds to stimulation  Airway & Oxygen Therapy: Patient Spontanous Breathing and Patient connected to nasal cannula oxygen  Post-op Assessment: Report given to RN, Post -op Vital signs reviewed and stable and Patient moving all extremities X 4  Post vital signs: Reviewed and stable  Last Vitals:  Vitals:   04/13/17 0843 04/13/17 0947  BP:  (!) 160/89  Pulse: 95   Resp: 18   Temp: 36.6 C   SpO2: 93%     Last Pain:  Vitals:   04/13/17 0843  TempSrc: Oral      Patients Stated Pain Goal: 3 (96/22/29 7989)  Complications: No apparent anesthesia complications

## 2017-04-13 NOTE — Anesthesia Preprocedure Evaluation (Signed)
Anesthesia Evaluation  Patient identified by MRN, date of birth, ID band Patient awake    Reviewed: Allergy & Precautions, NPO status , Patient's Chart, lab work & pertinent test results  Airway Mallampati: I  TM Distance: >3 FB Neck ROM: Full    Dental   Pulmonary COPD, Current Smoker,    Pulmonary exam normal        Cardiovascular hypertension, Pt. on medications Normal cardiovascular exam     Neuro/Psych    GI/Hepatic GERD  Medicated and Controlled,  Endo/Other    Renal/GU      Musculoskeletal   Abdominal   Peds  Hematology   Anesthesia Other Findings   Reproductive/Obstetrics                             Anesthesia Physical Anesthesia Plan  ASA: III  Anesthesia Plan: MAC   Post-op Pain Management:    Induction: Intravenous  PONV Risk Score and Plan: 0 and Ondansetron and Treatment may vary due to age or medical condition  Airway Management Planned: Simple Face Mask  Additional Equipment:   Intra-op Plan:   Post-operative Plan:   Informed Consent: I have reviewed the patients History and Physical, chart, labs and discussed the procedure including the risks, benefits and alternatives for the proposed anesthesia with the patient or authorized representative who has indicated his/her understanding and acceptance.     Plan Discussed with: CRNA and Surgeon  Anesthesia Plan Comments:         Anesthesia Quick Evaluation

## 2017-04-13 NOTE — Anesthesia Postprocedure Evaluation (Signed)
Anesthesia Post Note  Patient: Wayne [name]  Procedure(s) Performed: EXCISION EPIDERMAL INCLUSION CYST LEFT GROIN (Left Groin)     Patient location during evaluation: PACU Anesthesia Type: MAC Level of consciousness: awake and alert Pain management: pain level controlled Vital Signs Assessment: post-procedure vital signs reviewed and stable Respiratory status: spontaneous breathing, nonlabored ventilation, respiratory function stable and patient connected to nasal cannula oxygen Cardiovascular status: stable and blood pressure returned to baseline Postop Assessment: no apparent nausea or vomiting Anesthetic complications: no    Last Vitals:  Vitals:   04/13/17 1313 04/13/17 1325  BP:  121/74  Pulse: (!) 107 93  Resp: 15 15  Temp:    SpO2: 90% 92%    Last Pain:  Vitals:   04/13/17 0843  TempSrc: Oral                 Ellieana Dolecki DAVID

## 2017-04-13 NOTE — Op Note (Signed)
    OPERATIVE NOTE   PROCEDURE: 1. Excision of left groin epidermal inclusion cyst  PRE-OPERATIVE DIAGNOSIS: left groin cyst, aortoiliac arterial disease  POST-OPERATIVE DIAGNOSIS: same as above   SURGEON: Adele Barthel, MD  ANESTHESIA: local and MAC  ESTIMATED BLOOD LOSS: 50 cc  FINDING(S): 1.  No signs of infection 2.  Epidermal inclusion cyst excised completely  SPECIMEN(S):  Left groin epidermal inclusion cyst  INDICATIONS:   Wayne [name] is a 62 y.o. male who presents with aortoiliac arterial disease.  His recent aortogram and bilateral leg runoff suggested that aortobifemoral bypass would be needed.  There was a large cyst in what would be the femoral exposure in the left groin.  In order to avoid potentially contaminating the left aortobifemoral limb, I recommended excising the left groin epidermal inclusion cyst and letting the left groin fully heal before proceeding with aortobifemoral bypass to lower infection risks.  Risks include but not limited to bleeding, infection, and cutaneous nerve injury.  Patient is aware of the risks and agrees to proceed.   DESCRIPTION: After obtaining full informed written consent, the patient was brought back to the operating room and placed supine upon the operating table.  The patient received IV antibiotics prior to induction.  A procedure time out was completed and the correct surgical site was verified.  After obtaining adequate anesthesia, the patient was prepped and draped in the standard fashion for: left groin exploration.  I injected 7 cc of 1% lidocaine with epinephrine in a field fashion around the left groin cyst.  I made an incision over the cyst and then sharply dissected the cyst with its wall from the subcutaneous tissue.  I had most of the cyst dissected out prior to entry into the cyst.  There was no findings consistent with infection.  I fulgurated the cyst cavity and then reapproximated the subcutaneous tissue with 3-0  Vicryl sutures.  The skin was reapproximated with a running subcuticular stitch of 4-0 Monocryl.  The skin was cleaned, dried, and reinforced with Dermabond.   COMPLICATIONS: none  CONDITION: stable   Adele Barthel, MD, [facility] [place] Office: [phone] Pager: [phone]  04/13/2017, 12:03 PM

## 2017-04-13 NOTE — Progress Notes (Signed)
Dr Michelle Piper informed of CBG no new orders. No s/s noted at this time.

## 2017-04-13 NOTE — Interval H&P Note (Signed)
   History and Physical Update  The patient was interviewed and re-examined.  The patient's previous History and Physical has been reviewed and is unchanged from my consult except for: interval aortogram with bilateral leg runoff.  This patient will need an aortobifemoral bypass to reconstruct his proximal arterial system.  There is left groin epidermal inclusion cyst that I am concerned will infected a left aortofemoral limb.  I recommended: excision of the left epidermal inclusion cyst.  Risk include but are not limited to bleeding, infection, and possible cutaneous nerve injury.   Leonides Sake, MD, FACS [facility] Office: [phone] Pager: [phone]  04/13/2017, 10:34 AM

## 2017-04-14 ENCOUNTER — Encounter (HOSPITAL_COMMUNITY): Payer: Self-pay | Admitting: Vascular Surgery

## 2017-04-15 ENCOUNTER — Telehealth: Payer: Self-pay | Admitting: Vascular Surgery

## 2017-04-15 NOTE — Telephone Encounter (Signed)
-----   Message from Fransisco Hertz, MD sent at 04/14/2017  4:22 PM EST ----- Regarding: RE: 2 weeks to discuss Aortobifem BP with Dr. Imogene Burn Change follow up to 4 week with the CTA  ----- Message ----- From: Jena Gauss Sent: 04/14/2017   9:58 AM To: Fransisco Hertz, MD Subject: FW: 2 weeks to discuss Aortobifem BP with Dr#  Pt has an appt in 2 weeks that was a 4 w follow up with a CTA abd.pel s/p aortogram. Should we still do the CTA?  ----- Message ----- From: Sharee Pimple, RN Sent: 04/13/2017  12:49 PM To: [facility] Admin Pool Subject: 2 weeks to discuss Aortobifem BP with Dr. Ch#    ----- Message ----- From: Fransisco Hertz, MD Sent: 04/13/2017  12:12 PM To: [address]  Wayne [name] [id number] 08-07-1955   PROCEDURE: Excision of left groin epidermal inclusion cyst  Follow-up: 2 weeks for preop counseling for ABF

## 2017-04-27 NOTE — Progress Notes (Signed)
Established Critical Limb Ischemia Patient   History of Present Illness   Wayne [name] is a 62 y.o. (March 06, 1956) male who presents with chief complaint: R>L rest pain.   Prior procedures included: Ao, BRo (04/01/17).  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  The patient was sent for cardiac clearance: cleared and CTA abd/pelvis to evaluate aorta for calcification and planning for ABF.   Past Medical History:  Diagnosis Date  . Arthritis   . CHF (congestive heart failure) (HCC)   . COPD (chronic obstructive pulmonary disease) (HCC)   . GERD (gastroesophageal reflux disease)   . Hypercholesterolemia   . Hypertension   . Myocardial infarction (HCC)   . Peripheral vascular disease (HCC)   . Pre-diabetes     Past Surgical History:  Procedure Laterality Date  . ABDOMINAL AORTOGRAM W/LOWER EXTREMITY N/A 04/01/2017   Procedure: ABDOMINAL AORTOGRAM W/LOWER EXTREMITY;  Surgeon: Fransisco Hertz, MD;  Location: [facility] INVASIVE CV LAB;  Service: Cardiovascular;  Laterality: N/A;  Bilateral  . HEMORRHOIDECTOMY WITH HEMORRHOID BANDING    . MASS EXCISION Left 04/13/2017   Procedure: EXCISION EPIDERMAL INCLUSION CYST LEFT GROIN;  Surgeon: Fransisco Hertz, MD;  Location: [facility] OR;  Service: Vascular;  Laterality: Left;    Social History   Socioeconomic History  . Marital status: Married    Spouse name: Not on file  . Number of children: Not on file  . Years of education: Not on file  . Highest education level: Not on file  Social Needs  . Financial resource strain: Not on file  . Food insecurity - worry: Not on file  . Food insecurity - inability: Not on file  . Transportation needs - medical: Not on file  . Transportation needs - non-medical: Not on file  Occupational History  . Not on file  Tobacco Use  . Smoking status: Current Every Day Smoker    Packs/day: 1.00    Types: Cigarettes  . Smokeless  tobacco: Never Used  . Tobacco comment: 4-5 cigs a day  Substance and Sexual Activity  . Alcohol use: Yes    Comment: weekly  . Drug use: No  . Sexual activity: Not on file  Other Topics Concern  . Not on file  Social History Narrative  . Not on file    Family History  Problem Relation Age of Onset  . Hypertension Sister   . Hyperlipidemia Sister   . Diabetes Sister   . Peripheral Artery Disease Father     Current Outpatient Medications  Medication Sig Dispense Refill  . acetaminophen (TYLENOL) 500 MG tablet Take 2 tablets (1,000 mg total) by mouth every 6 (six) hours as needed. (Patient taking differently: Take 250 mg by mouth every 6 (six) hours as needed for moderate pain. Takes 0.5 tablet) 30 tablet 0  . albuterol (PROVENTIL HFA;VENTOLIN HFA) 108 (90 Base) MCG/ACT inhaler Inhale 2 puffs into the lungs every 6 (six) hours as needed for wheezing or shortness of breath.    Marland Kitchen aspirin EC 81 MG tablet Take 81 mg by mouth daily.    . budesonide-formoterol (SYMBICORT) 160-4.5 MCG/ACT inhaler Inhale 2 puffs into the lungs 2 (two) times daily.    . Glycerin-Hypromellose-PEG 400 (HM DRY EYE RELIEF OP) Apply 1 drop to eye daily as needed (dry eyes).    . meloxicam (MOBIC) 15 MG tablet Take 15 mg by mouth daily as needed for pain.     Marland Kitchen  omeprazole (PRILOSEC) 20 MG capsule Take 20 mg by mouth daily as needed (indigestion).     . ondansetron (ZOFRAN ODT) 4 MG disintegrating tablet Take 1 tablet (4 mg total) by mouth every 8 (eight) hours as needed for nausea or vomiting. (Patient taking differently: Take 4 mg by mouth every 8 (eight) hours as needed for nausea or vomiting (before pain medications). ) 10 tablet 1  . Sacubitril-Valsartan (ENTRESTO PO) Take by mouth.    Marland Kitchen tiZANidine (ZANAFLEX) 4 MG tablet Take 4 mg by mouth at bedtime.     No current facility-administered medications for this visit.      Allergies  Allergen Reactions  . Penicillins Itching, Rash and Other (See Comments)     PATIENT HAS HAD A PCN REACTION WITH IMMEDIATE RASH, FACIAL/TONGUE/THROAT SWELLING, SOB, OR LIGHTHEADEDNESS WITH HYPOTENSION:  #  #  #  YES  #  #  #   Has patient had a PCN reaction causing severe rash involving mucus membranes or skin necrosis: No Has patient had a PCN reaction that required hospitalization: No Has patient had a PCN reaction occurring within the last 10 years: No If all of the above answers are "NO", then may proceed with Cephalosporin use.     REVIEW OF SYSTEMS (negative unless checked):   Cardiac:  []  Chest pain or chest pressure? []  Shortness of breath upon activity? []  Shortness of breath when lying flat? []  Irregular heart rhythm?  Vascular:  [x]  Pain in calf, thigh, or hip brought on by walking? [x]  Pain in feet at night that wakes you up from your sleep? []  Blood clot in your veins? []  Leg swelling?  Pulmonary:  []  Oxygen at home? []  Productive cough? []  Wheezing?  Neurologic:  []  Sudden weakness in arms or legs? []  Sudden numbness in arms or legs? []  Sudden onset of difficult speaking or slurred speech? []  Temporary loss of vision in one eye? []  Problems with dizziness?  Gastrointestinal:  []  Blood in stool? []  Vomited blood?  Genitourinary:  []  Burning when urinating? []  Blood in urine?  Psychiatric:  []  Major depression  Hematologic:  []  Bleeding problems? []  Problems with blood clotting?  Dermatologic:  []  Rashes or ulcers?  Constitutional:  []  Fever or chills?  Ear/Nose/Throat:  []  Change in hearing? []  Nose bleeds? []  Sore throat?  Musculoskeletal:  []  Back pain? []  Joint pain? []  Muscle pain?   Physical Examination   Vitals:   04/28/17 1353  BP: (!) 157/88  Pulse: 99  Resp: 20  Temp: (!) 97.5 F (36.4 C)  TempSrc: Oral  SpO2: 90%  Weight: 113 lb (51.3 kg)  Height: 5\' 3"  (1.6 m)   Body mass index is 20.02 kg/m.  General Alert, O x 3, WD, NAD  Pulmonary Sym exp, good B air movt, CTA B  Cardiac RRR, Nl  S1, S2, no Murmurs, No rubs, No S3,S4  Vascular Vessel Right Left  Radial Palpable Palpable  Brachial Palpable Palpable  Carotid Palpable, No Bruit Palpable, No Bruit  Aorta Not palpable N/A  Femoral Not palpable Palpable  Popliteal Not palpable Not palpable  PT Not palpable Not palpable  DP Not palpable Not palpable    Gastro- intestinal soft, non-distended, non-tender to palpation, No guarding or rebound, no HSM, no masses, no CVAT B, No palpable prominent aortic pulse,    Musculo- skeletal M/S 5/5 throughout  , Extremities without ischemic changes  , No edema present, No visible varicosities , No Lipodermatosclerosis present, left groin incision  is healed  Neurologic Cranial nerves 2-12 intact , Pain and light touch intact in extremities , Motor exam as listed above    Radiology     Ct Angio Abdomen Pelvis  W &/or Wo Contrast  Result Date: 04/28/2017 CLINICAL DATA:  Atherosclerosis EXAM: CTA ABDOMEN AND PELVIS wITHOUT AND WITH CONTRAST TECHNIQUE: Multidetector CT imaging of the abdomen and pelvis was performed using the standard protocol during bolus administration of intravenous contrast. Multiplanar reconstructed images and MIPs were obtained and reviewed to evaluate the vascular anatomy. CONTRAST:  75mL ISOVUE-370 IOPAMIDOL (ISOVUE-370) INJECTION 76% COMPARISON:  04/29/2010 FINDINGS: VASCULAR Aorta: Diffuse atherosclerotic calcifications and some smooth plaque is seen throughout the lower thoracic and abdominal aorta. It is nonaneurysmal. Celiac: Patent.  Branch vessels patent. SMA: Patent. Mild atherosclerotic calcification at the origin and mid SMA. Branch vessels grossly patent. Renals: Single renal arteries are patent with atherosclerotic calcification at the origins. IMA: Moderate disease at the origin. Diminutive vessel. Branch vessels grossly patent. Inflow: There are stents in the right common and external iliac arteries. Right common iliac artery is occluded. Right external  iliac artery is occluded. Right internal iliac artery main trunk is occluded. Distal branches reconstitute. There is severe narrowing at the origin of the left common iliac artery with calcified and soft plaque causing narrowing for a length of 2 cm. The distal left common iliac artery is patent to the bifurcation. There is again noted to be significant but moderate narrowing at the origin of the left external iliac artery. The origin of the internal iliac artery is occluded. Branch vessels reconstitute. Proximal Outflow: Bilateral superficial femoral artery occlusion. Veins: No obvious DVT. Contrast reflux into the hepatic veins and IVC indicates an element of right heart dysfunction and elevated right heart pressures. The right atrium is also dilated. Review of the MIP images confirms the above findings. NON-VASCULAR Lower chest: No acute abnormality. Hepatobiliary: No acute focal abnormality of the liver. Gallbladder is unremarkable. Pancreas: Unremarkable Spleen: Unremarkable Adrenals/Urinary Tract: Tiny cyst in the medial lower pole of the left kidney is stable. Right kidney and adrenal glands are unremarkable. Bladder is decompressed. Stomach/Bowel: Stomach and duodenum are within normal limits. There is no evidence of small-bowel obstruction. Prominent stool burden throughout the colon. No obvious mass in the colon. Lymphatic: No abnormal retroperitoneal adenopathy. Reproductive: Prostate is within normal limits. Other: Small amount of free fluid in the pelvis is nonspecific. Musculoskeletal: No vertebral compression deformity. IMPRESSION: VASCULAR Diffuse atherosclerotic changes of the aorta without aneurysm. 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 This has progressed. Significant narrowing at the origins of the left common and external iliac arteries. Stable at the origin of the left common iliac artery and progressed at the origin of the left external iliac artery. Right superficial femoral  artery occlusion is new. Left superficial femoral artery occlusion is stable. NON-VASCULAR Small amount of free fluid in the pelvis is nonspecific. Electronically Signed   By: Jolaine Click M.D.   On: 04/28/2017 12:46   I reviewed this patient's CTA, e has scattered calcific atherosclerosis in the aorta that may require a limited aortic endarterectomy at the aortic neck.  Both CFA are small and under perfused.  There is posterior plaquing bilaterally.  The left femoral bifurcation appears to be high.   Medical Decision Making   ARIAS WEINERT is a 62 y.o. male who presents with: BLE critical limb ischemia due to severe aortoiliac disease   Based on the patient's vascular studies and examination, I  have offered the patient: aortobifemoral graft. The patient is aware the risks of aortic surgery include but are not limited to: bleeding, need for transfusion, infection, death, stroke, paralysis, wound complications, bowel injuries, impotence, bowel ischemia, extended ventilation and future ventral hernias.   Overall, I cited a mortality rate of 5-10% and morbidity rate of 30%. The patient has agreed to proceed and will be scheduled: 5 MAR 19. I discussed with the patient hold on further smoking for the next couple of weeks to give him a better chance of avoid extended intubation and ventilation.  I discussed in depth with the patient the nature of atherosclerosis, and emphasized the importance of maximal medical management including strict control of blood pressure, blood glucose, and lipid levels, antiplatelet agents, obtaining regular exercise, and cessation of smoking.    The patient is aware that without maximal medical management the underlying atherosclerotic disease process will progress, limiting the benefit of any interventions. The patient is currently not on a statin. Patient will be started on Lipitor 10 mg PO daily, to be titrated and managed by their primary physician.  The patient is  currently on an anti-platelet: ASA.  Thank you for allowing [place] to participate in this patient's care.   Leonides Sake, MD, FACS [facility] Office: [phone] Pager: [phone]

## 2017-04-27 NOTE — H&P (View-Only) (Signed)
  Established Critical Limb Ischemia Patient   History of Present Illness   Wayne [name] is a 61 y.o. (02/09/1956) male who presents with chief complaint: R>L rest pain.   Prior procedures included: Ao, BRo (04/01/17).  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  The patient was sent for cardiac clearance: cleared and CTA abd/pelvis to evaluate aorta for calcification and planning for ABF.   Past Medical History:  Diagnosis Date  . Arthritis   . CHF (congestive heart failure) (HCC)   . COPD (chronic obstructive pulmonary disease) (HCC)   . GERD (gastroesophageal reflux disease)   . Hypercholesterolemia   . Hypertension   . Myocardial infarction (HCC)   . Peripheral vascular disease (HCC)   . Pre-diabetes     Past Surgical History:  Procedure Laterality Date  . ABDOMINAL AORTOGRAM W/LOWER EXTREMITY N/A 04/01/2017   Procedure: ABDOMINAL AORTOGRAM W/LOWER EXTREMITY;  Surgeon: Chen, Brian L, MD;  Location: [facility];  Service: Cardiovascular;  Laterality: N/A;  Bilateral  . HEMORRHOIDECTOMY WITH HEMORRHOID BANDING    . MASS EXCISION Left 04/13/2017   Procedure: EXCISION EPIDERMAL INCLUSION CYST LEFT GROIN;  Surgeon: Chen, Brian L, MD;  Location: [facility] OR;  Service: Vascular;  Laterality: Left;    Social History   Socioeconomic History  . Marital status: Married    Spouse name: Not on file  . Number of children: Not on file  . Years of education: Not on file  . Highest education level: Not on file  Social Needs  . Financial resource strain: Not on file  . Food insecurity - worry: Not on file  . Food insecurity - inability: Not on file  . Transportation needs - medical: Not on file  . Transportation needs - non-medical: Not on file  Occupational History  . Not on file  Tobacco Use  . Smoking status: Current Every Day Smoker    Packs/day: 1.00    Types: Cigarettes  . Smokeless  tobacco: Never Used  . Tobacco comment: 4-5 cigs a day  Substance and Sexual Activity  . Alcohol use: Yes    Comment: weekly  . Drug use: No  . Sexual activity: Not on file  Other Topics Concern  . Not on file  Social History Narrative  . Not on file    Family History  Problem Relation Age of Onset  . Hypertension Sister   . Hyperlipidemia Sister   . Diabetes Sister   . Peripheral Artery Disease Father     Current Outpatient Medications  Medication Sig Dispense Refill  . acetaminophen (TYLENOL) 500 MG tablet Take 2 tablets (1,000 mg total) by mouth every 6 (six) hours as needed. (Patient taking differently: Take 250 mg by mouth every 6 (six) hours as needed for moderate pain. Takes 0.5 tablet) 30 tablet 0  . albuterol (PROVENTIL HFA;VENTOLIN HFA) 108 (90 Base) MCG/ACT inhaler Inhale 2 puffs into the lungs every 6 (six) hours as needed for wheezing or shortness of breath.    . aspirin EC 81 MG tablet Take 81 mg by mouth daily.    . budesonide-formoterol (SYMBICORT) 160-4.5 MCG/ACT inhaler Inhale 2 puffs into the lungs 2 (two) times daily.    . Glycerin-Hypromellose-PEG 400 (HM DRY EYE RELIEF OP) Apply 1 drop to eye daily as needed (dry eyes).    . meloxicam (MOBIC) 15 MG tablet Take 15 mg by mouth daily as needed for pain.     .   omeprazole (PRILOSEC) 20 MG capsule Take 20 mg by mouth daily as needed (indigestion).     . ondansetron (ZOFRAN ODT) 4 MG disintegrating tablet Take 1 tablet (4 mg total) by mouth every 8 (eight) hours as needed for nausea or vomiting. (Patient taking differently: Take 4 mg by mouth every 8 (eight) hours as needed for nausea or vomiting (before pain medications). ) 10 tablet 1  . Sacubitril-Valsartan (ENTRESTO PO) Take by mouth.    . tiZANidine (ZANAFLEX) 4 MG tablet Take 4 mg by mouth at bedtime.     No current facility-administered medications for this visit.      Allergies  Allergen Reactions  . Penicillins Itching, Rash and Other (See Comments)     PATIENT HAS HAD A PCN REACTION WITH IMMEDIATE RASH, FACIAL/TONGUE/THROAT SWELLING, SOB, OR LIGHTHEADEDNESS WITH HYPOTENSION:  #  #  #  YES  #  #  #   Has patient had a PCN reaction causing severe rash involving mucus membranes or skin necrosis: No Has patient had a PCN reaction that required hospitalization: No Has patient had a PCN reaction occurring within the last 10 years: No If all of the above answers are "NO", then may proceed with Cephalosporin use.     REVIEW OF SYSTEMS (negative unless checked):   Cardiac:  [] Chest pain or chest pressure? [] Shortness of breath upon activity? [] Shortness of breath when lying flat? [] Irregular heart rhythm?  Vascular:  [x] Pain in calf, thigh, or hip brought on by walking? [x] Pain in feet at night that wakes you up from your sleep? [] Blood clot in your veins? [] Leg swelling?  Pulmonary:  [] Oxygen at home? [] Productive cough? [] Wheezing?  Neurologic:  [] Sudden weakness in arms or legs? [] Sudden numbness in arms or legs? [] Sudden onset of difficult speaking or slurred speech? [] Temporary loss of vision in one eye? [] Problems with dizziness?  Gastrointestinal:  [] Blood in stool? [] Vomited blood?  Genitourinary:  [] Burning when urinating? [] Blood in urine?  Psychiatric:  [] Major depression  Hematologic:  [] Bleeding problems? [] Problems with blood clotting?  Dermatologic:  [] Rashes or ulcers?  Constitutional:  [] Fever or chills?  Ear/Nose/Throat:  [] Change in hearing? [] Nose bleeds? [] Sore throat?  Musculoskeletal:  [] Back pain? [] Joint pain? [] Muscle pain?   Physical Examination   Vitals:   04/28/17 1353  BP: (!) 157/88  Pulse: 99  Resp: 20  Temp: (!) 97.5 F (36.4 C)  TempSrc: Oral  SpO2: 90%  Weight: 113 lb (51.3 kg)  Height: 5' 3" (1.6 m)   Body mass index is 20.02 kg/m.  General Alert, O x 3, WD, NAD  Pulmonary Sym exp, good B air movt, CTA B  Cardiac RRR, Nl  S1, S2, no Murmurs, No rubs, No S3,S4  Vascular Vessel Right Left  Radial Palpable Palpable  Brachial Palpable Palpable  Carotid Palpable, No Bruit Palpable, No Bruit  Aorta Not palpable N/A  Femoral Not palpable Palpable  Popliteal Not palpable Not palpable  PT Not palpable Not palpable  DP Not palpable Not palpable    Gastro- intestinal soft, non-distended, non-tender to palpation, No guarding or rebound, no HSM, no masses, no CVAT B, No palpable prominent aortic pulse,    Musculo- skeletal M/S 5/5 throughout  , Extremities without ischemic changes  , No edema present, No visible varicosities , No Lipodermatosclerosis present, left groin incision   is healed  Neurologic Cranial nerves 2-12 intact , Pain and light touch intact in extremities , Motor exam as listed above    Radiology     Ct Angio Abdomen Pelvis  W &/or Wo Contrast  Result Date: 04/28/2017 CLINICAL DATA:  Atherosclerosis EXAM: CTA ABDOMEN AND PELVIS wITHOUT AND WITH CONTRAST TECHNIQUE: Multidetector CT imaging of the abdomen and pelvis was performed using the standard protocol during bolus administration of intravenous contrast. Multiplanar reconstructed images and MIPs were obtained and reviewed to evaluate the vascular anatomy. CONTRAST:  75mL ISOVUE-370 IOPAMIDOL (ISOVUE-370) INJECTION 76% COMPARISON:  04/29/2010 FINDINGS: VASCULAR Aorta: Diffuse atherosclerotic calcifications and some smooth plaque is seen throughout the lower thoracic and abdominal aorta. It is nonaneurysmal. Celiac: Patent.  Branch vessels patent. SMA: Patent. Mild atherosclerotic calcification at the origin and mid SMA. Branch vessels grossly patent. Renals: Single renal arteries are patent with atherosclerotic calcification at the origins. IMA: Moderate disease at the origin. Diminutive vessel. Branch vessels grossly patent. Inflow: There are stents in the right common and external iliac arteries. Right common iliac artery is occluded. Right external  iliac artery is occluded. Right internal iliac artery main trunk is occluded. Distal branches reconstitute. There is severe narrowing at the origin of the left common iliac artery with calcified and soft plaque causing narrowing for a length of 2 cm. The distal left common iliac artery is patent to the bifurcation. There is again noted to be significant but moderate narrowing at the origin of the left external iliac artery. The origin of the internal iliac artery is occluded. Branch vessels reconstitute. Proximal Outflow: Bilateral superficial femoral artery occlusion. Veins: No obvious DVT. Contrast reflux into the hepatic veins and IVC indicates an element of right heart dysfunction and elevated right heart pressures. The right atrium is also dilated. Review of the MIP images confirms the above findings. NON-VASCULAR Lower chest: No acute abnormality. Hepatobiliary: No acute focal abnormality of the liver. Gallbladder is unremarkable. Pancreas: Unremarkable Spleen: Unremarkable Adrenals/Urinary Tract: Tiny cyst in the medial lower pole of the left kidney is stable. Right kidney and adrenal glands are unremarkable. Bladder is decompressed. Stomach/Bowel: Stomach and duodenum are within normal limits. There is no evidence of small-bowel obstruction. Prominent stool burden throughout the colon. No obvious mass in the colon. Lymphatic: No abnormal retroperitoneal adenopathy. Reproductive: Prostate is within normal limits. Other: Small amount of free fluid in the pelvis is nonspecific. Musculoskeletal: No vertebral compression deformity. IMPRESSION: VASCULAR Diffuse atherosclerotic changes of the aorta without aneurysm. 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 This has progressed. Significant narrowing at the origins of the left common and external iliac arteries. Stable at the origin of the left common iliac artery and progressed at the origin of the left external iliac artery. Right superficial femoral  artery occlusion is new. Left superficial femoral artery occlusion is stable. NON-VASCULAR Small amount of free fluid in the pelvis is nonspecific. Electronically Signed   By: Arthur  Hoss M.D.   On: 04/28/2017 12:46   I reviewed this patient's CTA, e has scattered calcific atherosclerosis in the aorta that may require a limited aortic endarterectomy at the aortic neck.  Both CFA are small and under perfused.  There is posterior plaquing bilaterally.  The left femoral bifurcation appears to be high.   Medical Decision Making   Elye E Rubert is a 61 y.o. male who presents with: BLE critical limb ischemia due to severe aortoiliac disease   Based on the patient's vascular studies and examination, I   have offered the patient: aortobifemoral graft. The patient is aware the risks of aortic surgery include but are not limited to: bleeding, need for transfusion, infection, death, stroke, paralysis, wound complications, bowel injuries, impotence, bowel ischemia, extended ventilation and future ventral hernias.   Overall, I cited a mortality rate of 5-10% and morbidity rate of 30%. The patient has agreed to proceed and will be scheduled: 5 MAR 19. I discussed with the patient hold on further smoking for the next couple of weeks to give him a better chance of avoid extended intubation and ventilation.  I discussed in depth with the patient the nature of atherosclerosis, and emphasized the importance of maximal medical management including strict control of blood pressure, blood glucose, and lipid levels, antiplatelet agents, obtaining regular exercise, and cessation of smoking.    The patient is aware that without maximal medical management the underlying atherosclerotic disease process will progress, limiting the benefit of any interventions. The patient is currently not on a statin. Patient will be started on Lipitor 10 mg PO daily, to be titrated and managed by their primary physician.  The patient is  currently on an anti-platelet: ASA.  Thank you for allowing us to participate in this patient's care.   Brian Chen, MD, FACS [facility] Office: [phone] Pager: [phone]  

## 2017-04-28 ENCOUNTER — Other Ambulatory Visit: Payer: Self-pay | Admitting: *Deleted

## 2017-04-28 ENCOUNTER — Other Ambulatory Visit: Payer: Self-pay

## 2017-04-28 ENCOUNTER — Ambulatory Visit (INDEPENDENT_AMBULATORY_CARE_PROVIDER_SITE_OTHER): Payer: Medicaid Other | Admitting: Vascular Surgery

## 2017-04-28 ENCOUNTER — Encounter: Payer: Self-pay | Admitting: Vascular Surgery

## 2017-04-28 ENCOUNTER — Encounter: Payer: Self-pay | Admitting: *Deleted

## 2017-04-28 ENCOUNTER — Ambulatory Visit
Admission: RE | Admit: 2017-04-28 | Discharge: 2017-04-28 | Disposition: A | Discharge status: Home / Self Care | Payer: Medicaid Other | Source: Ambulatory Visit | Attending: Vascular Surgery | Admitting: Vascular Surgery

## 2017-04-28 VITALS — BP 157/88 | HR 99 | Temp 97.5°F | Resp 20 | Ht 63.0 in | Wt 113.0 lb

## 2017-04-28 DIAGNOSIS — I700 Atherosclerosis of aorta: Secondary | ICD-10-CM

## 2017-04-28 DIAGNOSIS — I70229 Atherosclerosis of native arteries of extremities with rest pain, unspecified extremity: Secondary | ICD-10-CM

## 2017-04-28 DIAGNOSIS — I998 Other disorder of circulatory system: Secondary | ICD-10-CM | POA: Diagnosis not present

## 2017-04-28 MED ORDER — IOPAMIDOL (ISOVUE-370) INJECTION 76%
75.0000 mL | Freq: Once | INTRAVENOUS | Status: AC | PRN
  Administered 2017-04-28: 75 mL via INTRAVENOUS

## 2017-05-07 ENCOUNTER — Other Ambulatory Visit: Payer: Self-pay

## 2017-05-07 ENCOUNTER — Encounter (HOSPITAL_COMMUNITY): Payer: Self-pay | Admitting: *Deleted

## 2017-05-07 NOTE — Progress Notes (Signed)
Spoke with pt for pre-op call. Pt states he was told in 2004 that he had a heart attack. He states he didn't have to have stents or angioplasty. Pt denies any recent chest pain. Pt has COPD and he has sob with that at times. Pt's cardiologist is Arnette Felts, [place].  Pt states he's prediabetic. A1C was 6.1 on 04/13/17.

## 2017-05-10 ENCOUNTER — Inpatient Hospital Stay (HOSPITAL_COMMUNITY): Payer: Medicaid Other | Admitting: Certified Registered Nurse Anesthetist

## 2017-05-10 ENCOUNTER — Encounter (HOSPITAL_COMMUNITY): Payer: Self-pay | Admitting: Certified Registered Nurse Anesthetist

## 2017-05-10 MED ORDER — VANCOMYCIN HCL IN DEXTROSE 1-5 GM/200ML-% IV SOLN
1000.0000 mg | INTRAVENOUS | Status: DC
  Filled 2017-05-10: qty 200

## 2017-05-11 ENCOUNTER — Other Ambulatory Visit: Payer: Self-pay

## 2017-05-11 ENCOUNTER — Inpatient Hospital Stay (HOSPITAL_COMMUNITY)
Admission: EM | Admit: 2017-05-11 | Discharge: 2017-05-14 | DRG: 190 | Disposition: A | Discharge status: Home / Self Care | Payer: Medicaid Other | Attending: Internal Medicine | Admitting: Internal Medicine

## 2017-05-11 ENCOUNTER — Encounter (HOSPITAL_COMMUNITY): Payer: Self-pay | Admitting: *Deleted

## 2017-05-11 ENCOUNTER — Ambulatory Visit (HOSPITAL_COMMUNITY): Payer: Medicaid Other

## 2017-05-11 ENCOUNTER — Encounter (HOSPITAL_COMMUNITY): Payer: Self-pay | Admitting: Certified Registered Nurse Anesthetist

## 2017-05-11 ENCOUNTER — Encounter (HOSPITAL_COMMUNITY)
Admission: RE | Disposition: A | Discharge status: Home / Self Care | Payer: Self-pay | Source: Ambulatory Visit | Attending: Vascular Surgery

## 2017-05-11 ENCOUNTER — Ambulatory Visit (HOSPITAL_COMMUNITY)
Admission: RE | Admit: 2017-05-11 | Discharge: 2017-05-11 | Disposition: A | Discharge status: Home / Self Care | Payer: Medicaid Other | Source: Ambulatory Visit | Attending: Vascular Surgery | Admitting: Vascular Surgery

## 2017-05-11 ENCOUNTER — Inpatient Hospital Stay (HOSPITAL_COMMUNITY): Payer: Medicaid Other

## 2017-05-11 DIAGNOSIS — Z880 Allergy status to penicillin: Secondary | ICD-10-CM | POA: Diagnosis not present

## 2017-05-11 DIAGNOSIS — Z720 Tobacco use: Secondary | ICD-10-CM | POA: Diagnosis present

## 2017-05-11 DIAGNOSIS — E785 Hyperlipidemia, unspecified: Secondary | ICD-10-CM | POA: Diagnosis present

## 2017-05-11 DIAGNOSIS — E7800 Pure hypercholesterolemia, unspecified: Secondary | ICD-10-CM | POA: Diagnosis present

## 2017-05-11 DIAGNOSIS — F1010 Alcohol abuse, uncomplicated: Secondary | ICD-10-CM | POA: Diagnosis present

## 2017-05-11 DIAGNOSIS — E440 Moderate protein-calorie malnutrition: Secondary | ICD-10-CM | POA: Diagnosis present

## 2017-05-11 DIAGNOSIS — E871 Hypo-osmolality and hyponatremia: Secondary | ICD-10-CM | POA: Diagnosis present

## 2017-05-11 DIAGNOSIS — Z7982 Long term (current) use of aspirin: Secondary | ICD-10-CM

## 2017-05-11 DIAGNOSIS — F17210 Nicotine dependence, cigarettes, uncomplicated: Secondary | ICD-10-CM | POA: Diagnosis present

## 2017-05-11 DIAGNOSIS — Z7951 Long term (current) use of inhaled steroids: Secondary | ICD-10-CM | POA: Diagnosis not present

## 2017-05-11 DIAGNOSIS — Z8249 Family history of ischemic heart disease and other diseases of the circulatory system: Secondary | ICD-10-CM | POA: Diagnosis not present

## 2017-05-11 DIAGNOSIS — Z79899 Other long term (current) drug therapy: Secondary | ICD-10-CM | POA: Diagnosis not present

## 2017-05-11 DIAGNOSIS — I998 Other disorder of circulatory system: Secondary | ICD-10-CM | POA: Diagnosis not present

## 2017-05-11 DIAGNOSIS — R7303 Prediabetes: Secondary | ICD-10-CM | POA: Diagnosis present

## 2017-05-11 DIAGNOSIS — I252 Old myocardial infarction: Secondary | ICD-10-CM

## 2017-05-11 DIAGNOSIS — Z833 Family history of diabetes mellitus: Secondary | ICD-10-CM

## 2017-05-11 DIAGNOSIS — I110 Hypertensive heart disease with heart failure: Secondary | ICD-10-CM | POA: Diagnosis present

## 2017-05-11 DIAGNOSIS — K219 Gastro-esophageal reflux disease without esophagitis: Secondary | ICD-10-CM | POA: Diagnosis present

## 2017-05-11 DIAGNOSIS — J9601 Acute respiratory failure with hypoxia: Secondary | ICD-10-CM | POA: Diagnosis present

## 2017-05-11 DIAGNOSIS — I10 Essential (primary) hypertension: Secondary | ICD-10-CM | POA: Diagnosis not present

## 2017-05-11 DIAGNOSIS — J441 Chronic obstructive pulmonary disease with (acute) exacerbation: Secondary | ICD-10-CM | POA: Diagnosis present

## 2017-05-11 DIAGNOSIS — R0602 Shortness of breath: Secondary | ICD-10-CM

## 2017-05-11 DIAGNOSIS — I70229 Atherosclerosis of native arteries of extremities with rest pain, unspecified extremity: Secondary | ICD-10-CM | POA: Diagnosis present

## 2017-05-11 DIAGNOSIS — Z681 Body mass index (BMI) 19 or less, adult: Secondary | ICD-10-CM

## 2017-05-11 DIAGNOSIS — R0902 Hypoxemia: Secondary | ICD-10-CM | POA: Diagnosis not present

## 2017-05-11 DIAGNOSIS — B974 Respiratory syncytial virus as the cause of diseases classified elsewhere: Secondary | ICD-10-CM | POA: Diagnosis present

## 2017-05-11 DIAGNOSIS — M5136 Other intervertebral disc degeneration, lumbar region: Secondary | ICD-10-CM | POA: Diagnosis present

## 2017-05-11 DIAGNOSIS — I5022 Chronic systolic (congestive) heart failure: Secondary | ICD-10-CM | POA: Diagnosis present

## 2017-05-11 DIAGNOSIS — M1990 Unspecified osteoarthritis, unspecified site: Secondary | ICD-10-CM | POA: Diagnosis present

## 2017-05-11 DIAGNOSIS — J449 Chronic obstructive pulmonary disease, unspecified: Secondary | ICD-10-CM | POA: Insufficient documentation

## 2017-05-11 DIAGNOSIS — J9621 Acute and chronic respiratory failure with hypoxia: Secondary | ICD-10-CM | POA: Diagnosis present

## 2017-05-11 DIAGNOSIS — R509 Fever, unspecified: Secondary | ICD-10-CM | POA: Diagnosis present

## 2017-05-11 DIAGNOSIS — D649 Anemia, unspecified: Secondary | ICD-10-CM | POA: Insufficient documentation

## 2017-05-11 HISTORY — DX: Personal history of other diseases of the digestive system: Z87.19

## 2017-05-11 HISTORY — DX: Anemia, unspecified: D64.9

## 2017-05-11 HISTORY — DX: Dyspnea, unspecified: R06.00

## 2017-05-11 HISTORY — DX: Personal history of other medical treatment: Z92.89

## 2017-05-11 LAB — I-STAT ARTERIAL BLOOD GAS, ED
ACID-BASE DEFICIT: 4 mmol/L — AB (ref 0.0–2.0)
BICARBONATE: 21.2 mmol/L (ref 20.0–28.0)
O2 Saturation: 98 %
TCO2: 22 mmol/L (ref 22–32)
pCO2 arterial: 37.5 mmHg (ref 32.0–48.0)
pH, Arterial: 7.361 (ref 7.350–7.450)
pO2, Arterial: 119 mmHg — ABNORMAL HIGH (ref 83.0–108.0)

## 2017-05-11 LAB — COMPREHENSIVE METABOLIC PANEL
ALBUMIN: 3.5 g/dL (ref 3.5–5.0)
ALBUMIN: 3.3 g/dL — AB (ref 3.5–5.0)
ALK PHOS: 95 U/L (ref 38–126)
ALK PHOS: 92 U/L (ref 38–126)
ALT: 20 U/L (ref 17–63)
ALT: 20 U/L (ref 17–63)
ANION GAP: 13 (ref 5–15)
ANION GAP: 11 (ref 5–15)
AST: 36 U/L (ref 15–41)
AST: 39 U/L (ref 15–41)
BILIRUBIN TOTAL: 1.3 mg/dL — AB (ref 0.3–1.2)
BILIRUBIN TOTAL: 1.2 mg/dL (ref 0.3–1.2)
BUN: 5 mg/dL — AB (ref 6–20)
BUN: 5 mg/dL — AB (ref 6–20)
CALCIUM: 8.6 mg/dL — AB (ref 8.9–10.3)
CALCIUM: 8.7 mg/dL — AB (ref 8.9–10.3)
CO2: 20 mmol/L — AB (ref 22–32)
CO2: 22 mmol/L (ref 22–32)
Chloride: 98 mmol/L — ABNORMAL LOW (ref 101–111)
Chloride: 100 mmol/L — ABNORMAL LOW (ref 101–111)
Creatinine, Ser: 0.89 mg/dL (ref 0.61–1.24)
Creatinine, Ser: 0.89 mg/dL (ref 0.61–1.24)
GFR calc Af Amer: >60 mL/min (ref >60)
GFR calc Af Amer: >60 mL/min (ref >60)
GFR calc non Af Amer: >60 mL/min (ref >60)
GFR calc non Af Amer: >60 mL/min (ref >60)
GLUCOSE: 87 mg/dL (ref 65–99)
GLUCOSE: 90 mg/dL (ref 65–99)
POTASSIUM: 4.6 mmol/L (ref 3.5–5.1)
Potassium: 3.9 mmol/L (ref 3.5–5.1)
SODIUM: 131 mmol/L — AB (ref 135–145)
SODIUM: 133 mmol/L — AB (ref 135–145)
TOTAL PROTEIN: 6.7 g/dL (ref 6.5–8.1)
Total Protein: 6.9 g/dL (ref 6.5–8.1)

## 2017-05-11 LAB — CBC WITH DIFFERENTIAL/PLATELET
BASOS ABS: 0 10*3/uL (ref 0.0–0.1)
BASOS PCT: 1 %
EOS ABS: 0 10*3/uL (ref 0.0–0.7)
Eosinophils Relative: 0 %
HEMATOCRIT: 45.6 % (ref 39.0–52.0)
HEMOGLOBIN: 15.5 g/dL (ref 13.0–17.0)
Lymphocytes Relative: 10 %
Lymphs Abs: 0.5 10*3/uL — ABNORMAL LOW (ref 0.7–4.0)
MCH: 34.4 pg — ABNORMAL HIGH (ref 26.0–34.0)
MCHC: 34 g/dL (ref 30.0–36.0)
MCV: 101.3 fL — ABNORMAL HIGH (ref 78.0–100.0)
Monocytes Absolute: 0.8 10*3/uL (ref 0.1–1.0)
Monocytes Relative: 16 %
NEUTROS ABS: 3.5 10*3/uL (ref 1.7–7.7)
NEUTROS PCT: 73 %
Platelets: 170 10*3/uL (ref 150–400)
RBC: 4.5 MIL/uL (ref 4.22–5.81)
RDW: 14.8 % (ref 11.5–15.5)
WBC: 4.8 10*3/uL (ref 4.0–10.5)

## 2017-05-11 LAB — URINALYSIS, ROUTINE W REFLEX MICROSCOPIC
BACTERIA UA: NONE SEEN
BILIRUBIN URINE: NEGATIVE
Bacteria, UA: NONE SEEN
Bilirubin Urine: NEGATIVE
GLUCOSE, UA: NEGATIVE mg/dL
Glucose, UA: NEGATIVE mg/dL
Ketones, ur: NEGATIVE mg/dL
Ketones, ur: NEGATIVE mg/dL
Leukocytes, UA: NEGATIVE
Leukocytes, UA: NEGATIVE
NITRITE: NEGATIVE
NITRITE: NEGATIVE
Protein, ur: NEGATIVE mg/dL
Protein, ur: 100 mg/dL — AB
SPECIFIC GRAVITY, URINE: 1.009 (ref 1.005–1.030)
Specific Gravity, Urine: 1.005 (ref 1.005–1.030)
Squamous Epithelial / LPF: NONE SEEN
Squamous Epithelial / LPF: NONE SEEN
WBC, UA: NONE SEEN WBC/hpf (ref 0–5)
pH: 7 (ref 5.0–8.0)
pH: 7 (ref 5.0–8.0)

## 2017-05-11 LAB — INFLUENZA PANEL BY PCR (TYPE A & B)
INFLAPCR: NEGATIVE
Influenza B By PCR: NEGATIVE

## 2017-05-11 LAB — RESPIRATORY PANEL BY PCR
Adenovirus: NOT DETECTED
BORDETELLA PERTUSSIS-RVPCR: NOT DETECTED
CHLAMYDOPHILA PNEUMONIAE-RVPPCR: NOT DETECTED
CORONAVIRUS HKU1-RVPPCR: NOT DETECTED
Coronavirus 229E: NOT DETECTED
Coronavirus NL63: NOT DETECTED
Coronavirus OC43: NOT DETECTED
INFLUENZA A-RVPPCR: NOT DETECTED
INFLUENZA B-RVPPCR: NOT DETECTED
Metapneumovirus: NOT DETECTED
Mycoplasma pneumoniae: NOT DETECTED
PARAINFLUENZA VIRUS 3-RVPPCR: NOT DETECTED
PARAINFLUENZA VIRUS 4-RVPPCR: NOT DETECTED
Parainfluenza Virus 1: NOT DETECTED
Parainfluenza Virus 2: NOT DETECTED
RESPIRATORY SYNCYTIAL VIRUS-RVPPCR: DETECTED — AB
RHINOVIRUS / ENTEROVIRUS - RVPPCR: NOT DETECTED

## 2017-05-11 LAB — PROTIME-INR
INR: 1.32
Prothrombin Time: 16.2 seconds — ABNORMAL HIGH (ref 11.4–15.2)

## 2017-05-11 LAB — CBC
HEMATOCRIT: 46.8 % (ref 39.0–52.0)
HEMOGLOBIN: 16.2 g/dL (ref 13.0–17.0)
MCH: 35 pg — ABNORMAL HIGH (ref 26.0–34.0)
MCHC: 34.6 g/dL (ref 30.0–36.0)
MCV: 101.1 fL — ABNORMAL HIGH (ref 78.0–100.0)
Platelets: 162 10*3/uL (ref 150–400)
RBC: 4.63 MIL/uL (ref 4.22–5.81)
RDW: 14.9 % (ref 11.5–15.5)
WBC: 5.2 10*3/uL (ref 4.0–10.5)

## 2017-05-11 LAB — I-STAT TROPONIN, ED: Troponin i, poc: 0.03 ng/mL (ref 0.00–0.08)

## 2017-05-11 LAB — MAGNESIUM: Magnesium: 2.1 mg/dL (ref 1.7–2.4)

## 2017-05-11 LAB — GLUCOSE, CAPILLARY: GLUCOSE-CAPILLARY: 84 mg/dL (ref 65–99)

## 2017-05-11 LAB — APTT: APTT: 33 s (ref 24–36)

## 2017-05-11 LAB — I-STAT CG4 LACTIC ACID, ED: Lactic Acid, Venous: 1.73 mmol/L (ref 0.5–1.9)

## 2017-05-11 LAB — BRAIN NATRIURETIC PEPTIDE: B NATRIURETIC PEPTIDE 5: 1284.4 pg/mL — AB (ref 0.0–100.0)

## 2017-05-11 SURGERY — CREATION, BYPASS, ARTERIAL, AORTA TO FEMORAL, BILATERAL, USING GRAFT
Anesthesia: Choice

## 2017-05-11 MED ORDER — GUAIFENESIN ER 600 MG PO TB12
600.0000 mg | ORAL_TABLET | Freq: Two times a day (BID) | ORAL | Status: DC
  Administered 2017-05-11 – 2017-05-14 (×7): 600 mg via ORAL
  Filled 2017-05-11 (×7): qty 1

## 2017-05-11 MED ORDER — ONDANSETRON 4 MG PO TBDP: 4.0000 mg | ORAL_TABLET | Freq: Three times a day (TID) | ORAL | Status: DC | PRN

## 2017-05-11 MED ORDER — PROPOFOL 10 MG/ML IV BOLUS
INTRAVENOUS | Status: AC
  Filled 2017-05-11: qty 20

## 2017-05-11 MED ORDER — CHLORHEXIDINE GLUCONATE 4 % EX LIQD: 60.0000 mL | Freq: Once | CUTANEOUS | Status: DC

## 2017-05-11 MED ORDER — IPRATROPIUM-ALBUTEROL 0.5-2.5 (3) MG/3ML IN SOLN
3.0000 mL | Freq: Once | RESPIRATORY_TRACT | Status: AC
  Administered 2017-05-11: 3 mL via RESPIRATORY_TRACT
  Filled 2017-05-11: qty 3

## 2017-05-11 MED ORDER — ROCURONIUM BROMIDE 10 MG/ML (PF) SYRINGE
PREFILLED_SYRINGE | INTRAVENOUS | Status: AC
  Filled 2017-05-11: qty 5

## 2017-05-11 MED ORDER — HYDROCODONE-ACETAMINOPHEN 5-325 MG PO TABS
1.0000 | ORAL_TABLET | ORAL | Status: DC | PRN
  Filled 2017-05-11: qty 2

## 2017-05-11 MED ORDER — ALBUTEROL SULFATE (2.5 MG/3ML) 0.083% IN NEBU: 2.5000 mg | INHALATION_SOLUTION | RESPIRATORY_TRACT | Status: DC | PRN

## 2017-05-11 MED ORDER — TRAZODONE HCL 50 MG PO TABS
25.0000 mg | ORAL_TABLET | Freq: Every evening | ORAL | Status: DC | PRN
  Administered 2017-05-13: 25 mg via ORAL
  Filled 2017-05-11: qty 1

## 2017-05-11 MED ORDER — METHYLPREDNISOLONE SODIUM SUCC 125 MG IJ SOLR
60.0000 mg | Freq: Four times a day (QID) | INTRAMUSCULAR | Status: DC
  Administered 2017-05-11 – 2017-05-14 (×12): 60 mg via INTRAVENOUS
  Filled 2017-05-11 (×12): qty 2

## 2017-05-11 MED ORDER — MUPIROCIN 2 % EX OINT
TOPICAL_OINTMENT | CUTANEOUS | Status: AC
  Administered 2017-05-11: 07:00:00
  Filled 2017-05-11: qty 22

## 2017-05-11 MED ORDER — MIDAZOLAM HCL 2 MG/2ML IJ SOLN
INTRAMUSCULAR | Status: AC
  Filled 2017-05-11: qty 2

## 2017-05-11 MED ORDER — LORAZEPAM 2 MG/ML IJ SOLN: 1.0000 mg | Freq: Four times a day (QID) | INTRAMUSCULAR | Status: AC | PRN

## 2017-05-11 MED ORDER — LORAZEPAM 1 MG PO TABS: 1.0000 mg | ORAL_TABLET | Freq: Four times a day (QID) | ORAL | Status: AC | PRN

## 2017-05-11 MED ORDER — FOLIC ACID 1 MG PO TABS
1.0000 mg | ORAL_TABLET | Freq: Every day | ORAL | Status: DC
  Administered 2017-05-11 – 2017-05-14 (×4): 1 mg via ORAL
  Filled 2017-05-11 (×4): qty 1

## 2017-05-11 MED ORDER — BISACODYL 5 MG PO TBEC: 5.0000 mg | DELAYED_RELEASE_TABLET | Freq: Every day | ORAL | Status: DC | PRN

## 2017-05-11 MED ORDER — SODIUM CHLORIDE 0.9 % IV SOLN
INTRAVENOUS | Status: AC
  Administered 2017-05-11 (×2): via INTRAVENOUS

## 2017-05-11 MED ORDER — NICOTINE 21 MG/24HR TD PT24
21.0000 mg | MEDICATED_PATCH | Freq: Every day | TRANSDERMAL | Status: DC
  Administered 2017-05-11 – 2017-05-14 (×5): 21 mg via TRANSDERMAL
  Filled 2017-05-11 (×5): qty 1

## 2017-05-11 MED ORDER — PANTOPRAZOLE SODIUM 40 MG PO TBEC
40.0000 mg | DELAYED_RELEASE_TABLET | Freq: Every day | ORAL | Status: DC
  Administered 2017-05-11 – 2017-05-14 (×4): 40 mg via ORAL
  Filled 2017-05-11 (×4): qty 1

## 2017-05-11 MED ORDER — VITAMIN B-1 100 MG PO TABS
100.0000 mg | ORAL_TABLET | Freq: Every day | ORAL | Status: DC
  Administered 2017-05-11 – 2017-05-14 (×4): 100 mg via ORAL
  Filled 2017-05-11 (×4): qty 1

## 2017-05-11 MED ORDER — LIDOCAINE 2% (20 MG/ML) 5 ML SYRINGE
INTRAMUSCULAR | Status: AC
  Filled 2017-05-11: qty 5

## 2017-05-11 MED ORDER — SODIUM CHLORIDE 0.9 % IV SOLN
100.0000 mg | Freq: Two times a day (BID) | INTRAVENOUS | Status: DC
  Administered 2017-05-11 – 2017-05-13 (×5): 100 mg via INTRAVENOUS
  Filled 2017-05-11 (×8): qty 100

## 2017-05-11 MED ORDER — TIZANIDINE HCL 4 MG PO TABS: 4.0000 mg | ORAL_TABLET | Freq: Every day | ORAL | Status: DC | PRN

## 2017-05-11 MED ORDER — ONDANSETRON HCL 4 MG/2ML IJ SOLN
INTRAMUSCULAR | Status: AC
  Filled 2017-05-11: qty 2

## 2017-05-11 MED ORDER — FENTANYL CITRATE (PF) 250 MCG/5ML IJ SOLN
INTRAMUSCULAR | Status: AC
  Filled 2017-05-11: qty 5

## 2017-05-11 MED ORDER — SACUBITRIL-VALSARTAN 97-103 MG PO TABS
1.0000 | ORAL_TABLET | Freq: Two times a day (BID) | ORAL | Status: DC
  Administered 2017-05-11 – 2017-05-14 (×7): 1 via ORAL
  Filled 2017-05-11 (×8): qty 1

## 2017-05-11 MED ORDER — DEXAMETHASONE SODIUM PHOSPHATE 10 MG/ML IJ SOLN
INTRAMUSCULAR | Status: AC
  Filled 2017-05-11: qty 1

## 2017-05-11 MED ORDER — FUROSEMIDE 20 MG PO TABS
20.0000 mg | ORAL_TABLET | Freq: Every day | ORAL | Status: DC
  Administered 2017-05-12 – 2017-05-14 (×3): 20 mg via ORAL
  Filled 2017-05-11 (×3): qty 1

## 2017-05-11 MED ORDER — THIAMINE HCL 100 MG/ML IJ SOLN: 100.0000 mg | Freq: Every day | INTRAMUSCULAR | Status: DC

## 2017-05-11 MED ORDER — IPRATROPIUM-ALBUTEROL 0.5-2.5 (3) MG/3ML IN SOLN
3.0000 mL | RESPIRATORY_TRACT | Status: DC
  Administered 2017-05-11 – 2017-05-12 (×5): 3 mL via RESPIRATORY_TRACT
  Filled 2017-05-11 (×5): qty 3

## 2017-05-11 MED ORDER — ASPIRIN EC 81 MG PO TBEC
81.0000 mg | DELAYED_RELEASE_TABLET | Freq: Every day | ORAL | Status: DC
  Administered 2017-05-11 – 2017-05-14 (×4): 81 mg via ORAL
  Filled 2017-05-11 (×4): qty 1

## 2017-05-11 MED ORDER — ADULT MULTIVITAMIN W/MINERALS CH
1.0000 | ORAL_TABLET | Freq: Every day | ORAL | Status: DC
  Administered 2017-05-11 – 2017-05-14 (×3): 1 via ORAL
  Filled 2017-05-11 (×4): qty 1

## 2017-05-11 MED ORDER — SODIUM CHLORIDE 0.9 % IV SOLN: INTRAVENOUS | Status: DC

## 2017-05-11 MED ORDER — ENOXAPARIN SODIUM 40 MG/0.4ML ~~LOC~~ SOLN
40.0000 mg | SUBCUTANEOUS | Status: DC
  Administered 2017-05-11 – 2017-05-13 (×3): 40 mg via SUBCUTANEOUS
  Filled 2017-05-11 (×4): qty 0.4

## 2017-05-11 MED ORDER — METHYLPREDNISOLONE SODIUM SUCC 125 MG IJ SOLR
125.0000 mg | Freq: Once | INTRAMUSCULAR | Status: AC
  Administered 2017-05-11: 125 mg via INTRAVENOUS
  Filled 2017-05-11: qty 2

## 2017-05-11 MED ORDER — IOPAMIDOL (ISOVUE-370) INJECTION 76%
INTRAVENOUS | Status: AC
  Administered 2017-05-11: 100 mL
  Filled 2017-05-11: qty 100

## 2017-05-11 NOTE — H&P (Signed)
History and Physical    Wayne [name] ZDG:[id number] DOB: Jul 14, 1955 DOA: 05/11/2017  PCP: Elinor Dodge, MD Patient coming from: home/pre-op  Chief Complaint: fever/cought/worsening sob  HPI: Wayne [name] is a very pleasant 62 y.o. male with medical history significant due to aortoiliac disease since emergency department from preop as he was scheduled to have aortobifemoral graft today but was found to have hypoxia, fever, cough and worsening shortness of breath while in preop. Initial evaluation in the emergency department reveals acute respiratory failure with hypoxia likely related to COPD exacerbation in the setting of possible viral infection. [facility] are asked to admit  Information is obtained from the chart and the patient. He states 3 days ago he developed cough with worsening shortness of breath. He does not wear oxygen at home but does use his inhalers. He states he uses them without relief. Associated symptoms include subjective fever chills decreased oral intake. He denies headache dizziness syncope or near-syncope. He reports he is coughing much more with it but it's nonproductive. Denies abdominal pain nausea vomiting diarrhea constipation melena bright red blood per rectum. He denies dysuria hematuria frequency or urgency. He does report chest pain gently with coughing and movement.    ED Course: In the emergency department max temperature 99.2. He is provided with nebulizers and 125 mg of Solu-Medrol. His oxygen saturation level noted to be in the 80s on room air.  Review of Systems: As per HPI otherwise all other systems reviewed and are negative.   Ambulatory Status: Ambulates independently is independent with ADLs  Past Medical History:  Diagnosis Date  . Anemia   . Arthritis   . CHF (congestive heart failure) (HCC)   . COPD (chronic obstructive pulmonary disease) (HCC)   . Dyspnea   . GERD (gastroesophageal reflux disease)   . History of  blood transfusion   . Hypercholesterolemia   . Hypertension   . Myocardial infarction (HCC) 2004  . Peripheral vascular disease (HCC)   . Pre-diabetes     Past Surgical History:  Procedure Laterality Date  . ABDOMINAL AORTOGRAM W/LOWER EXTREMITY N/A 04/01/2017   Procedure: ABDOMINAL AORTOGRAM W/LOWER EXTREMITY;  Surgeon: Fransisco Hertz, MD;  Location: [facility] INVASIVE CV LAB;  Service: Cardiovascular;  Laterality: N/A;  Bilateral  . COLONOSCOPY    . HEMORRHOIDECTOMY WITH HEMORRHOID BANDING    . MASS EXCISION Left 04/13/2017   Procedure: EXCISION EPIDERMAL INCLUSION CYST LEFT GROIN;  Surgeon: Fransisco Hertz, MD;  Location: [facility] OR;  Service: Vascular;  Laterality: Left;    Social History   Socioeconomic History  . Marital status: Married    Spouse name: Not on file  . Number of children: Not on file  . Years of education: Not on file  . Highest education level: Not on file  Social Needs  . Financial resource strain: Not on file  . Food insecurity - worry: Not on file  . Food insecurity - inability: Not on file  . Transportation needs - medical: Not on file  . Transportation needs - non-medical: Not on file  Occupational History  . Not on file  Tobacco Use  . Smoking status: Current Every Day Smoker    Packs/day: 1.00    Types: Cigarettes  . Smokeless tobacco: Never Used  . Tobacco comment: 4-5 cigs a day  Substance and Sexual Activity  . Alcohol use: Yes    Comment: weekly  . Drug use: No  . Sexual activity: Not on file  Other Topics  Concern  . Not on file  Social History Narrative  . Not on file    Allergies  Allergen Reactions  . Penicillins Itching, Rash and Other (See Comments)    PATIENT HAS HAD A PCN REACTION WITH IMMEDIATE RASH, FACIAL/TONGUE/THROAT SWELLING, SOB, OR LIGHTHEADEDNESS WITH HYPOTENSION:  #  #  #  YES  #  #  #   Has patient had a PCN reaction causing severe rash involving mucus membranes or skin necrosis: No Has patient had a PCN reaction that required  hospitalization: No Has patient had a PCN reaction occurring within the last 10 years: No If all of the above answers are "NO", then may proceed with Cephalosporin use.     Family History  Problem Relation Age of Onset  . Hypertension Sister   . Hyperlipidemia Sister   . Diabetes Sister   . Peripheral Artery Disease Father     Prior to Admission medications   Medication Sig Start Date End Date Taking? Authorizing Provider  acetaminophen (TYLENOL) 500 MG tablet Take 2 tablets (1,000 mg total) by mouth every 6 (six) hours as needed. Patient taking differently: Take 250 mg by mouth every 6 (six) hours as needed for moderate pain or headache.  02/17/16   Roxy Horseman, PA-C  albuterol (PROVENTIL HFA;VENTOLIN HFA) 108 (90 Base) MCG/ACT inhaler Inhale 2 puffs into the lungs every 6 (six) hours as needed for wheezing or shortness of breath.    [provider]  aspirin EC 81 MG tablet Take 81 mg by mouth daily.    [provider]  budesonide-formoterol (SYMBICORT) 160-4.5 MCG/ACT inhaler Inhale 2 puffs into the lungs 2 (two) times daily.    [provider]  furosemide (LASIX) 20 MG tablet Take 20 mg by mouth daily.    [provider]  Glycerin-Hypromellose-PEG 400 (HM DRY EYE RELIEF OP) Place 1 drop into both eyes daily as needed (for dry eyes).     [provider]  omeprazole (PRILOSEC) 20 MG capsule Take 20 mg by mouth daily as needed (for indegestion).     [provider]  ondansetron (ZOFRAN ODT) 4 MG disintegrating tablet Take 1 tablet (4 mg total) by mouth every 8 (eight) hours as needed for nausea or vomiting. Patient taking differently: Take 4 mg by mouth every 8 (eight) hours as needed for nausea or vomiting (before pain medications).  01/27/16   Molpus, John, MD  sacubitril-valsartan (ENTRESTO) 97-103 MG Take 1 tablet by mouth 2 (two) times daily.     [provider]  tiZANidine (ZANAFLEX) 4 MG tablet Take 4 mg by mouth  daily as needed for muscle spasms.     [provider]    Physical Exam: Vitals:   05/11/17 0830 05/11/17 0845 05/11/17 0900 05/11/17 0915  BP: (!) 151/83 (!) 153/85 (!) 171/91 (!) 160/94  Pulse: 98 96 (!) 101 98  Resp: (!) 27 (!) 33 (!) 21 (!) 23  Temp:      TempSrc:      SpO2: 96% 94% 94% 95%  Weight:      Height:         General:  Appears thin, frail, chronically ill, slightly anxious in no acute distress Eyes:  PERRL, EOMI, normal lids, iris ENT:  grossly normal hearing, lips & tongue, mucous membranes of his mouth are pink but dry Neck:  no LAD, masses or thyromegaly Cardiovascular:  RRR, no m/r/g. Trace lower extremity edema with right greater than left. Palpable pedal pulses in left.  Bilateral feet warm to touch Respiratory:  Moderate increased work of breathing with conversation. Frequent cough during exam. Uses accessory muscles unable to complete sentences breath sounds are quite diminished throughout hear very faint very end of expiration wheeze no crackles no rhonchi Abdomen:  soft, ntnd, NABS Skin:  no rash or induration seen on limited exam Musculoskeletal:  grossly normal tone BUE/BLE, good ROM, no bony abnormality Psychiatric:  grossly normal mood and affect, speech fluent and appropriate, AOx3 Neurologic:  CN 2-12 grossly intact, moves all extremities in coordinated fashion, sensation intact  Labs on Admission: I have personally reviewed following labs and imaging studies  CBC: Recent Labs  Lab 05/11/17 0636 05/11/17 0808  WBC 5.2 4.8  NEUTROABS  --  3.5  HGB 16.2 15.5  HCT 46.8 45.6  MCV 101.1* 101.3*  PLT 162 170   Basic Metabolic Panel: Recent Labs  Lab 05/11/17 0636 05/11/17 0808  NA 131* 133*  K 3.9 4.6  CL 98* 100*  CO2 20* 22  GLUCOSE 87 90  BUN 5* 5*  CREATININE 0.89 0.89  CALCIUM 8.6* 8.7*   GFR: Estimated Creatinine Clearance: 61.6 mL/min (by C-G formula based on SCr of 0.89 mg/dL). Liver Function Tests: Recent Labs  Lab  05/11/17 0636 05/11/17 0808  AST 36 39  ALT 20 20  ALKPHOS 95 92  BILITOT 1.3* 1.2  PROT 6.9 6.7  ALBUMIN 3.5 3.3*   No results for input(s): LIPASE, AMYLASE in the last 168 hours. No results for input(s): AMMONIA in the last 168 hours. Coagulation Profile: Recent Labs  Lab 05/11/17 0636  INR 1.32   Cardiac Enzymes: No results for input(s): CKTOTAL, CKMB, CKMBINDEX, TROPONINI in the last 168 hours. BNP (last 3 results) No results for input(s): PROBNP in the last 8760 hours. HbA1C: No results for input(s): HGBA1C in the last 72 hours. CBG: Recent Labs  Lab 05/11/17 0630  GLUCAP 84   Lipid Profile: No results for input(s): CHOL, HDL, LDLCALC, TRIG, CHOLHDL, LDLDIRECT in the last 72 hours. Thyroid Function Tests: No results for input(s): TSH, T4TOTAL, FREET4, T3FREE, THYROIDAB in the last 72 hours. Anemia Panel: No results for input(s): VITAMINB12, FOLATE, FERRITIN, TIBC, IRON, RETICCTPCT in the last 72 hours. Urine analysis:    Component Value Date/Time   COLORURINE YELLOW 05/11/2017 0808   APPEARANCEUR CLEAR 05/11/2017 0808   LABSPEC 1.009 05/11/2017 0808   PHURINE 7.0 05/11/2017 0808   GLUCOSEU NEGATIVE 05/11/2017 0808   HGBUR MODERATE (A) 05/11/2017 0808   BILIRUBINUR NEGATIVE 05/11/2017 0808   KETONESUR NEGATIVE 05/11/2017 0808   PROTEINUR 100 (A) 05/11/2017 0808   NITRITE NEGATIVE 05/11/2017 0808   LEUKOCYTESUR NEGATIVE 05/11/2017 0808    Creatinine Clearance: Estimated Creatinine Clearance: 61.6 mL/min (by C-G formula based on SCr of 0.89 mg/dL).  Sepsis Labs: @LABRCNTIP (procalcitonin:4,lacticidven:4) )No results found for this or any previous visit (from the past 240 hour(s)).   Radiological Exams on Admission: Dg Chest 2 View  Result Date: 05/11/2017 CLINICAL DATA:  Shortness of breath. Preop aortobifemoral bypass graft. EXAM: CHEST  2 VIEW COMPARISON:  01/19/2017 FINDINGS: Mild cardiomegaly is similar to prior exam. Chronic hyperinflation with  interstitial coarsening and suspected emphysema. Minimal fluid in the fissures, similar to prior exam. Linear scarring in the right upper lung zone. No confluent consolidation. Blunting of the costophrenic angles felt to be secondary to hyperinflation. No pneumothorax. No acute osseous abnormality. IMPRESSION: 1. No acute abnormality. 2. Unchanged hyperinflation and emphysema. Unchanged mild cardiomegaly. Electronically Signed   By: Shawna Orleans  Ehinger M.D.   On: 05/11/2017 07:01    EKG: Sinus rhythm LAE, consider biatrial enlargement Borderline right axis deviation Borderline repolarization abnormality ST changes in lateral and inferior leads new from prior ECG  Assessment/Plan Principal Problem:   Acute respiratory failure with hypoxia (HCC) Active Problems:   Critical lower limb ischemia   Chronic systolic heart failure (HCC)   Degenerative disc disease, lumbar   Hyperlipidemia   Hyponatremia   Tobacco abuse   Hypertension   COPD with acute exacerbation (HCC)   Fever   ETOH abuse   #1. Acute respiratory failure with hypoxia likely related to COPD exacerbation in the setting of possible viral respiratory infection. Oxygen saturation level mid 80s on room air, worsening dyspnea, frequent nonproductive cough. Influenza panel negative. Lactic acid within the limits of normal, no leukocytosis, initial troponin negative. Chest x-ray reveals no acute abnormality and unchanged hyperinflation and emphysema. Unchanged mild cardiomegaly. He is provided with nebulizer's Solu-Medrol in the emergency department. He remains quite short of breath -Admit to telemetry -Obtain an ABG -Continue oxygen supplementation -Scheduled nebulizers -Solu-Medrol 60 mg IV every 6 -Flutter valve -Antitussives -Follow CT angio chest rule out PE  #2. COPD exacerbation. Patient is not on home oxygen. He does have inhalers. He has a pulmonologist and [place]. Unsure if he's ever had pulmonary function tests. Influenza  panel negative. -Scheduled nebulizers -Solu-Medrol as noted above -Flutter valve -Antitussives -Oxygen supplementation -Monitor oxygen saturation level -Wean oxygen as able  #3. Hypertension. Blood pressure high end of normal. I medications include Lasix, entresto -hold lasix until tomorrow -continue Entresto -monitor  4. Chronic systolic heart failure. Appears compensated.  Home medications include Lasix and Entresto. Chest x-ray as noted above. EF unknown. Chart review no record of recent echo -Daily weights -Intake and output -Continue home meds -Monitor  #5. Hyponatremia. Mild. Likely related to consumption of beer. -Gentle IV fluids -Hold Lasix for today -Recheck in the morning  #6. Critical lower limb ischemia he was scheduled for ABF today per Dr. Imogene Burn -Appears stable at baseline -We'll need to reschedule  #7. EtOH abuse. He drinks beer daily -CIWA  #8. Tobacco use -cessation counseling -nicoderm   DVT prophylaxis: lovenox  Code Status: full  Family Communication: sister at bedside  Disposition Plan: home  Consults called: none  Admission status: inpatient    Gwenyth Bender MD [facility]  If 7PM-7AM, please contact night-coverage [email or web address] Password [facility]  05/11/2017, 10:24 AM

## 2017-05-11 NOTE — ED Provider Notes (Signed)
[facility] EMERGENCY DEPARTMENT Provider Note   CSN: [id number] Arrival date & time: 05/11/17  8768     History   Chief Complaint Chief Complaint  Patient presents with  . Fever    HPI Wayne [name] is a 62 y.o. male.  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  Patient reports he began to feel sick on Friday or Saturday, with cough and dyspnea worsening on Saturday. Had subjective fevers last night. Has prn lasix and inhalers that he has tried without relief.  Reports leg swelling that comes and goes, may be a little worse now. Frequent cough which is nonproductive and congestion. Stays on his own, no known sick contacts. Did not have influenza vaccine. Notes chest pain only with cough. Reports right leg is always more swollen than left. No hx of known DVT or PE.  Past Medical History:  Diagnosis Date  . Anemia   . Arthritis   . CHF (congestive heart failure) (HCC)   . COPD (chronic obstructive pulmonary disease) (HCC)   . Dyspnea   . GERD (gastroesophageal reflux disease)   . History of blood transfusion   . Hypercholesterolemia   . Hypertension   . Myocardial infarction (HCC) 2004  . Peripheral vascular disease (HCC)   . Pre-diabetes     Patient Active Problem List   Diagnosis Date Noted  . COPD with acute exacerbation (HCC) 05/11/2017  . Fever 05/11/2017  . ETOH abuse 05/11/2017  . Anemia   . COPD (chronic obstructive pulmonary disease) (HCC)   . Hypertension   . Critical lower limb ischemia 04/01/2017  . Peripheral vascular disease (HCC) 03/22/2017  . Simple chronic bronchitis (HCC) 03/22/2017  . 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 Degenerative disc disease, lumbar 12/11/2016  . Primary  osteoarthritis of right hip 12/11/2016  . Hypokalemia 01/19/2016  . Chronic systolic heart failure (HCC) 01/18/2016  . Acute respiratory failure with hypoxia (HCC) 01/18/2016  . Bullous lesion 01/18/2016  . Drinks beer 01/18/2016  . Hyperlipidemia 01/18/2016  . Hyponatremia 01/18/2016  . Tobacco abuse 01/18/2016    Past Surgical History:  Procedure Laterality Date  . ABDOMINAL AORTOGRAM W/LOWER EXTREMITY N/A 04/01/2017   Procedure: ABDOMINAL AORTOGRAM W/LOWER EXTREMITY;  Surgeon: Fransisco Hertz, MD;  Location: [facility] INVASIVE CV LAB;  Service: Cardiovascular;  Laterality: N/A;  Bilateral  . COLONOSCOPY    . HEMORRHOIDECTOMY WITH HEMORRHOID BANDING    . MASS EXCISION Left 04/13/2017   Procedure: EXCISION EPIDERMAL INCLUSION CYST LEFT GROIN;  Surgeon: Fransisco Hertz, MD;  Location: [facility] OR;  Service: Vascular;  Laterality: Left;       Home Medications    Prior to Admission medications   Medication Sig Start Date End Date Taking? Authorizing Provider  acetaminophen (TYLENOL) 500 MG tablet Take 2 tablets (1,000 mg total) by mouth every 6 (six) hours as needed. Patient taking differently: Take 250 mg by mouth every 6 (six) hours as needed for moderate pain or headache.  02/17/16   Roxy Horseman, PA-C  albuterol (PROVENTIL HFA;VENTOLIN HFA) 108 (90 Base) MCG/ACT inhaler Inhale 2 puffs into the lungs every 6 (six) hours as needed for wheezing or shortness of breath.    [provider]  aspirin EC  81 MG tablet Take 81 mg by mouth daily.    [provider]  budesonide-formoterol (SYMBICORT) 160-4.5 MCG/ACT inhaler Inhale 2 puffs into the lungs 2 (two) times daily.    [provider]  furosemide (LASIX) 20 MG tablet Take 20 mg by mouth daily.    [provider]  Glycerin-Hypromellose-PEG 400 (HM DRY EYE RELIEF OP) Place 1 drop into both eyes daily as needed (for dry eyes).     [provider]  omeprazole (PRILOSEC) 20 MG capsule Take 20 mg by mouth daily  as needed (for indegestion).     [provider]  ondansetron (ZOFRAN ODT) 4 MG disintegrating tablet Take 1 tablet (4 mg total) by mouth every 8 (eight) hours as needed for nausea or vomiting. Patient taking differently: Take 4 mg by mouth every 8 (eight) hours as needed for nausea or vomiting (before pain medications).  01/27/16   Molpus, John, MD  sacubitril-valsartan (ENTRESTO) 97-103 MG Take 1 tablet by mouth 2 (two) times daily.     [provider]  tiZANidine (ZANAFLEX) 4 MG tablet Take 4 mg by mouth daily as needed for muscle spasms.     [provider]    Family History Family History  Problem Relation Age of Onset  . Hypertension Sister   . Hyperlipidemia Sister   . Diabetes Sister   . Peripheral Artery Disease Father     Social History Social History   Tobacco Use  . Smoking status: Current Every Day Smoker    Packs/day: 1.00    Types: Cigarettes  . Smokeless tobacco: Never Used  . Tobacco comment: 4-5 cigs a day  Substance Use Topics  . Alcohol use: Yes    Comment: weekly  . Drug use: No     Allergies   Penicillins   Review of Systems Review of Systems  Constitutional: Positive for fever.  HENT: Negative for sore throat.   Eyes: Negative for visual disturbance.  Respiratory: Positive for cough and shortness of breath.   Cardiovascular: Positive for chest pain (with cough). Negative for leg swelling.  Gastrointestinal: Negative for abdominal pain, nausea and vomiting.  Genitourinary: Negative for difficulty urinating.  Musculoskeletal: Negative for back pain and neck stiffness.  Skin: Negative for rash.  Neurological: Negative for syncope and headaches.     Physical Exam Updated Vital Signs BP (!) 160/94   Pulse 98   Temp 99.2 F (37.3 C) (Oral)   Resp (!) 23   Ht 5\' 3"  (1.6 m)   Wt 51.3 kg (113 lb)   SpO2 95%   BMI 20.02 kg/m   Physical Exam  Constitutional: He is oriented to person, place, and time. He appears  [name]-developed and [name]-nourished. No distress.  HENT:  Head: Normocephalic and atraumatic.  Eyes: Conjunctivae and EOM are normal.  Neck: Normal range of motion.  Cardiovascular: Normal rate, regular rhythm, normal heart sounds and intact distal pulses. Exam reveals no gallop and no friction rub.  No murmur heard. Pulmonary/Chest: Effort normal and breath sounds normal. No respiratory distress. He has no wheezes. He has no rales.  Frequent cough Diminished breath sounds   Abdominal: Soft. He exhibits no distension. There is no tenderness. There is no guarding.  Musculoskeletal: He exhibits edema (trace bilateral/sock lines).  Pulses LE not palpable, feet noted to be warm bilaterally, mildly increased edema in right leg in comparison to left  Neurological: He is alert and oriented to person, place, and time.  Skin: Skin is warm and  dry. He is not diaphoretic.  Nursing note and vitals reviewed.    ED Treatments / Results  Labs (all labs ordered are listed, but only abnormal results are displayed) Labs Reviewed  CBC WITH DIFFERENTIAL/PLATELET - Abnormal; Notable for the following components:      Result Value   MCV 101.3 (*)    MCH 34.4 (*)    Lymphs Abs 0.5 (*)    All other components within normal limits  COMPREHENSIVE METABOLIC PANEL - Abnormal; Notable for the following components:   Sodium 133 (*)    Chloride 100 (*)    BUN 5 (*)    Calcium 8.7 (*)    Albumin 3.3 (*)    All other components within normal limits  BRAIN NATRIURETIC PEPTIDE - Abnormal; Notable for the following components:   B Natriuretic Peptide 1,284.4 (*)    All other components within normal limits  URINALYSIS, ROUTINE W REFLEX MICROSCOPIC - Abnormal; Notable for the following components:   Hgb urine dipstick MODERATE (*)    Protein, ur 100 (*)    All other components within normal limits  CULTURE, BLOOD (ROUTINE X 2)  CULTURE, BLOOD (ROUTINE X 2)  URINE CULTURE  RESPIRATORY PANEL BY PCR  INFLUENZA  PANEL BY PCR (TYPE A & B)  HIV ANTIBODY (ROUTINE TESTING)  BLOOD GAS, ARTERIAL  MAGNESIUM  I-STAT CG4 LACTIC ACID, ED  I-STAT TROPONIN, ED    EKG  EKG Interpretation  Date/Time:  Tuesday May 11 2017 07:36:59 EST Ventricular Rate:  99 PR Interval:    QRS Duration: 92 QT Interval:  344 QTC Calculation: 442 R Axis:   90 Text Interpretation:  Sinus rhythm LAE, consider biatrial enlargement Borderline right axis deviation Borderline repolarization abnormality ST changes in lateral and inferior leads new from prior ECG Confirmed by Alvira Monday ([id number]) on 05/11/2017 8:00:08 AM       Radiology Dg Chest 2 View  Result Date: 05/11/2017 CLINICAL DATA:  Shortness of breath. Preop aortobifemoral bypass graft. EXAM: CHEST  2 VIEW COMPARISON:  01/19/2017 FINDINGS: Mild cardiomegaly is similar to prior exam. Chronic hyperinflation with interstitial coarsening and suspected emphysema. Minimal fluid in the fissures, similar to prior exam. Linear scarring in the right upper lung zone. No confluent consolidation. Blunting of the costophrenic angles felt to be secondary to hyperinflation. No pneumothorax. No acute osseous abnormality. IMPRESSION: 1. No acute abnormality. 2. Unchanged hyperinflation and emphysema. Unchanged mild cardiomegaly. Electronically Signed   By: Rubye Oaks M.D.   On: 05/11/2017 07:01    Procedures Procedures (including critical care time)  Medications Ordered in ED Medications  aspirin EC tablet 81 mg (not administered)  furosemide (LASIX) tablet 20 mg (not administered)  pantoprazole (PROTONIX) EC tablet 40 mg (not administered)  ondansetron (ZOFRAN-ODT) disintegrating tablet 4 mg (not administered)  sacubitril-valsartan (ENTRESTO) 97-103 mg per tablet (not administered)  tiZANidine (ZANAFLEX) tablet 4 mg (not administered)  nicotine (NICODERM CQ - dosed in mg/24 hours) patch 21 mg (not administered)  ipratropium-albuterol (DUONEB) 0.5-2.5 (3) MG/3ML nebulizer  solution 3 mL (not administered)  methylPREDNISolone sodium succinate (SOLU-MEDROL) 125 mg/2 mL injection 60 mg (not administered)  LORazepam (ATIVAN) tablet 1 mg (not administered)    Or  LORazepam (ATIVAN) injection 1 mg (not administered)  thiamine (VITAMIN B-1) tablet 100 mg (not administered)    Or  thiamine (B-1) injection 100 mg (not administered)  folic acid (FOLVITE) tablet 1 mg (not administered)  multivitamin with minerals tablet 1 tablet (not administered)  enoxaparin (LOVENOX) injection 40  mg (not administered)  0.9 %  sodium chloride infusion (not administered)  HYDROcodone-acetaminophen (NORCO/VICODIN) 5-325 MG per tablet 1-2 tablet (not administered)  traZODone (DESYREL) tablet 25 mg (not administered)  bisacodyl (DULCOLAX) EC tablet 5 mg (not administered)  guaiFENesin (MUCINEX) 12 hr tablet 600 mg (not administered)  methylPREDNISolone sodium succinate (SOLU-MEDROL) 125 mg/2 mL injection 125 mg (125 mg Intravenous Given 05/11/17 0923)  ipratropium-albuterol (DUONEB) 0.5-2.5 (3) MG/3ML nebulizer solution 3 mL (3 mLs Nebulization Given 05/11/17 0811)     Initial Impression / Assessment and Plan / ED Course  I have reviewed the triage vital signs and the nursing notes.  Pertinent labs & imaging results that were available during my care of the patient were reviewed by me and considered in my medical decision making (see chart for details).     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   DDx includes pneumonia, viral infection including influenza with COPD exacerbation, CHF, or pulmonary embolus. Initially ordered blood and urine cx given fever.  CXR done in preop without acute abnormalities, no sign of pneumonia or CHF.  Preop labs without significant anemia, no  significant electrolyte abnormalities.   Obtained CT PE study given slight asymmetric leg swelling, hypoxia, low grade temperature.  Ordered influenza testing, solumedrol and duoneb given possible viral infection with COPD exacerbation.  CT pending to evaluate for PE vs occult pneumonia, however suspect more likely viral syndrome and COPD exacerbation. Flu test negative.   Plan to admit given new hypoxia.  BNP returned after call and is 1284, previously 963 and 1227 in 2017.  Possible multifactorial dyspnea from fever/infection, CHF component, r/o PE with CT.    Final Clinical Impressions(s) / ED Diagnoses   Final diagnoses:  Hypoxia  COPD exacerbation [facility])    ED Discharge Orders    None       Alvira Monday, MD 05/11/17 [phone]

## 2017-05-11 NOTE — ED Notes (Signed)
Attempted to call report x 1  

## 2017-05-11 NOTE — ED Triage Notes (Signed)
Pt here for vascular surgery this AM and was sent to ED because of fever. Pt reports a dry cough that started SAt.

## 2017-05-11 NOTE — ED Triage Notes (Signed)
Lunch ordered 

## 2017-05-11 NOTE — Progress Notes (Signed)
RN called Admission RN. Admission RN states she will see pt.

## 2017-05-11 NOTE — ED Triage Notes (Signed)
Wallace Cullens top tube sent for urine culture.

## 2017-05-11 NOTE — Interval H&P Note (Signed)
   History and Physical Update  The patient was interviewed and re-examined.  The patient's previous History and Physical has been reviewed and is unchanged except for: interval upper respiratory symptoms.  Patient is dyspneic and reportedly running fever at home.  Patient sat in high 80s on 4 L [place] oxygen.  I discussed with Anesthesia delaying the procedure for another 2 weeks to let the patient recover from his URI.  The patient agrees with this plan.  Leonides Sake, MD, FACS [facility] Office: [phone] Pager: [phone]  05/11/2017, 7:10 AM

## 2017-05-11 NOTE — Progress Notes (Signed)
RT attempted x 2 RR for abg but only obtained flash of venous sample. Additional RT notified for need for ABG.

## 2017-05-11 NOTE — Anesthesia Preprocedure Evaluation (Signed)
Anesthesia Evaluation  Patient identified by MRN, date of birth, ID band Patient awake    Reviewed: Allergy & Precautions, NPO status , Patient's Chart, lab work & pertinent test results  Airway        Dental  (+) Dental Advisory Given   Pulmonary shortness of breath and with exertion, COPD, Current Smoker,           Cardiovascular hypertension, Pt. on medications + Past MI, + Peripheral Vascular Disease and +CHF       Neuro/Psych    GI/Hepatic GERD  ,  Endo/Other    Renal/GU      Musculoskeletal  (+) Arthritis ,   Abdominal   Peds  Hematology   Anesthesia Other Findings   Reproductive/Obstetrics                             Anesthesia Physical Anesthesia Plan  ASA: III  Anesthesia Plan: General   Post-op Pain Management:    Induction: Intravenous  PONV Risk Score and Plan:   Airway Management Planned: Oral ETT  Additional Equipment: Arterial line and CVP  Intra-op Plan:   Post-operative Plan: Extubation in OR and Possible Post-op intubation/ventilation  Informed Consent: I have reviewed the patients History and Physical, chart, labs and discussed the procedure including the risks, benefits and alternatives for the proposed anesthesia with the patient or authorized representative who has indicated his/her understanding and acceptance.   Dental advisory given  Plan Discussed with: CRNA, Anesthesiologist and Surgeon  Anesthesia Plan Comments:         Anesthesia Quick Evaluation

## 2017-05-12 DIAGNOSIS — E440 Moderate protein-calorie malnutrition: Secondary | ICD-10-CM

## 2017-05-12 LAB — BASIC METABOLIC PANEL
ANION GAP: 11 (ref 5–15)
BUN: 8 mg/dL (ref 6–20)
CALCIUM: 8.5 mg/dL — AB (ref 8.9–10.3)
CHLORIDE: 103 mmol/L (ref 101–111)
CO2: 19 mmol/L — AB (ref 22–32)
CREATININE: 0.86 mg/dL (ref 0.61–1.24)
GFR calc non Af Amer: >60 mL/min (ref >60)
Glucose, Bld: 162 mg/dL — ABNORMAL HIGH (ref 65–99)
Potassium: 4 mmol/L (ref 3.5–5.1)
SODIUM: 133 mmol/L — AB (ref 135–145)

## 2017-05-12 LAB — CBC
HCT: 50.3 % (ref 39.0–52.0)
HEMOGLOBIN: 17.2 g/dL — AB (ref 13.0–17.0)
MCH: 34.7 pg — ABNORMAL HIGH (ref 26.0–34.0)
MCHC: 34.2 g/dL (ref 30.0–36.0)
MCV: 101.4 fL — AB (ref 78.0–100.0)
PLATELETS: 168 10*3/uL (ref 150–400)
RBC: 4.96 MIL/uL (ref 4.22–5.81)
RDW: 14.9 % (ref 11.5–15.5)
WBC: 2.4 10*3/uL — AB (ref 4.0–10.5)

## 2017-05-12 LAB — URINE CULTURE: Culture: NO GROWTH

## 2017-05-12 LAB — HIV ANTIBODY (ROUTINE TESTING W REFLEX): HIV Screen 4th Generation wRfx: NONREACTIVE

## 2017-05-12 MED ORDER — ENSURE ENLIVE PO LIQD
237.0000 mL | Freq: Two times a day (BID) | ORAL | Status: DC
  Administered 2017-05-12 – 2017-05-14 (×4): 237 mL via ORAL

## 2017-05-12 MED ORDER — GUAIFENESIN-DM 100-10 MG/5ML PO SYRP
5.0000 mL | ORAL_SOLUTION | ORAL | Status: DC | PRN
  Administered 2017-05-12 – 2017-05-13 (×3): 5 mL via ORAL
  Filled 2017-05-12 (×3): qty 5

## 2017-05-12 MED ORDER — IPRATROPIUM-ALBUTEROL 0.5-2.5 (3) MG/3ML IN SOLN
3.0000 mL | Freq: Three times a day (TID) | RESPIRATORY_TRACT | Status: DC
  Administered 2017-05-12 – 2017-05-13 (×3): 3 mL via RESPIRATORY_TRACT
  Filled 2017-05-12 (×4): qty 3

## 2017-05-12 NOTE — Progress Notes (Signed)
Patient requested not to be woken if he was sleeping for this tx. NO distress noted, chest rise and fall observed.

## 2017-05-12 NOTE — Discharge Instructions (Signed)

## 2017-05-12 NOTE — Progress Notes (Signed)
Patient ID: Wayne [name], male   DOB: 01-Jul-1955, 62 y.o.   MRN: [id number]  PROGRESS NOTE    Wayne [name]  ZDG:[id number] DOB: [birth year] DOA: 05/11/2017 PCP: Elinor Dodge, MD    Brief Narrative:  Wayne [name] is a very pleasant 62 y.o. male with medical history significant due to aortoiliac disease since emergency department from preop as he was scheduled to have aortobifemoral graft today but was found to have hypoxia, fever, cough and worsening shortness of breath while in preop. Initial evaluation in the emergency department reveals acute respiratory failure with hypoxia likely related to COPD exacerbation in the setting of possible viral infection    Assessment & Plan:   Principal Problem:   Acute respiratory failure with hypoxia (HCC) Active Problems:   Critical lower limb ischemia   Chronic systolic heart failure (HCC)   Degenerative disc disease, lumbar   Hyperlipidemia   Hyponatremia   Tobacco abuse   Hypertension   COPD with acute exacerbation (HCC)   Fever   ETOH abuse   #1 acute on chronic steroid failure: Patient is doing much better today. He is responding to treatment. Suspected due to COPD exacerbation. Continue Solu-Medrol with nebulizer and antibiotics.   #2 acute exacerbation of COPD: Continue treatment as above. No evidence of pneumonia. Continue antitussives and oxygen.   #3 hypertension: Continue blood pressure control.   #4 chronic systolic CHF: Patient appears compensated on Lasix. Continue monitoring on telemetry.  #5 tobacco abuse: Counseling provided.  #6 hyperlipidemia: Continue with statin.  #7 history of alcohol abuse: No recent alcohol abuse. Monitor with CIWA protocol if needed  #8 peripheral vascular disease: Patient has critical limb ischemia on the left with rest pain. Scheduled for ABF but due to respiratory compromise it is been placed on hold.   DVT prophylaxis:  Lovenox  Code Status: Full  Family  Communication: Sister  Disposition Plan: Home  Consultants:  None Procedures: none  Antimicrobials:  Doxycycline   Subjective:Patient is breathing a little better. Still has baseline crackles and bilateral wheeze   Objective: Vitals:   05/11/17 2330 05/12/17 0002 05/12/17 0550 05/12/17 0758  BP:  129/73 129/77   Pulse:  82 82   Resp:  18 18   Temp:  98.3 F (36.8 C) 98.3 F (36.8 C)   TempSrc:  Oral Oral   SpO2: 96% 96% 96% 95%  Weight:   48.9 kg (107 lb 11.2 oz)   Height:        Intake/Output Summary (Last 24 hours) at 05/12/2017 0904 Last data filed at 05/12/2017 0600 Gross per 24 hour  Intake 1567.5 ml  Output 1600 ml  Net -32.5 ml   Filed Weights   05/11/17 0742 05/11/17 1618 05/12/17 0550  Weight: 51.3 kg (113 lb) 49.1 kg (108 lb 5 oz) 48.9 kg (107 lb 11.2 oz)    Examination:  General exam: Appears calm and comfortable  Respiratory system: decreased air entry bilateral with some marked extremity wheezing Cardiovascular system: S1 & S2 heard, RRR. No JVD, murmurs, rubs, gallops or clicks. No pedal edema. Gastrointestinal system: Abdomen is nondistended, soft and nontender. No organomegaly or masses felt. Normal bowel sounds heard. Central nervous system: Alert and oriented. No focal neurological deficits. Extremities: Symmetric 5 x 5 power. Skin: No rashes, lesions or ulcers Psychiatry: Judgement and insight appear normal. Mood & affect appropriate.     Data Reviewed: I have personally reviewed following labs and imaging studies  CBC: Recent Labs  Lab  05/11/17 0636 05/11/17 0808 05/12/17 0711  WBC 5.2 4.8 2.4*  NEUTROABS  --  3.5  --   HGB 16.2 15.5 17.2*  HCT 46.8 45.6 50.3  MCV 101.1* 101.3* 101.4*  PLT 162 170 168   Basic Metabolic Panel: Recent Labs  Lab 05/11/17 0636 05/11/17 0808 05/11/17 1001 05/12/17 0711  NA 131* 133*  --  133*  K 3.9 4.6  --  4.0  CL 98* 100*  --  103  CO2 20* 22  --  19*  GLUCOSE 87 90  --  162*  BUN 5* 5*  --   8  CREATININE 0.89 0.89  --  0.86  CALCIUM 8.6* 8.7*  --  8.5*  MG  --   --  2.1  --    GFR: Estimated Creatinine Clearance: 60.8 mL/min (by C-G formula based on SCr of 0.86 mg/dL). Liver Function Tests: Recent Labs  Lab 05/11/17 0636 05/11/17 0808  AST 36 39  ALT 20 20  ALKPHOS 95 92  BILITOT 1.3* 1.2  PROT 6.9 6.7  ALBUMIN 3.5 3.3*   No results for input(s): LIPASE, AMYLASE in the last 168 hours. No results for input(s): AMMONIA in the last 168 hours. Coagulation Profile: Recent Labs  Lab 05/11/17 0636  INR 1.32   Cardiac Enzymes: No results for input(s): CKTOTAL, CKMB, CKMBINDEX, TROPONINI in the last 168 hours. BNP (last 3 results) No results for input(s): PROBNP in the last 8760 hours. HbA1C: No results for input(s): HGBA1C in the last 72 hours. CBG: Recent Labs  Lab 05/11/17 0630  GLUCAP 84   Lipid Profile: No results for input(s): CHOL, HDL, LDLCALC, TRIG, CHOLHDL, LDLDIRECT in the last 72 hours. Thyroid Function Tests: No results for input(s): TSH, T4TOTAL, FREET4, T3FREE, THYROIDAB in the last 72 hours. Anemia Panel: No results for input(s): VITAMINB12, FOLATE, FERRITIN, TIBC, IRON, RETICCTPCT in the last 72 hours. Urine analysis:    Component Value Date/Time   COLORURINE YELLOW 05/11/2017 0808   APPEARANCEUR CLEAR 05/11/2017 0808   LABSPEC 1.009 05/11/2017 0808   PHURINE 7.0 05/11/2017 0808   GLUCOSEU NEGATIVE 05/11/2017 0808   HGBUR MODERATE (A) 05/11/2017 0808   BILIRUBINUR NEGATIVE 05/11/2017 0808   KETONESUR NEGATIVE 05/11/2017 0808   PROTEINUR 100 (A) 05/11/2017 0808   NITRITE NEGATIVE 05/11/2017 0808   LEUKOCYTESUR NEGATIVE 05/11/2017 0808   Sepsis Labs: @LABRCNTIP (procalcitonin:4,lacticidven:4)  ) Recent Results (from the past 240 hour(s))  Respiratory Panel by PCR     Status: Abnormal   Collection Time: 05/11/17  8:08 AM  Result Value Ref Range Status   Adenovirus NOT DETECTED NOT DETECTED Final   Coronavirus 229E NOT DETECTED  NOT DETECTED Final   Coronavirus HKU1 NOT DETECTED NOT DETECTED Final   Coronavirus NL63 NOT DETECTED NOT DETECTED Final   Coronavirus OC43 NOT DETECTED NOT DETECTED Final   Metapneumovirus NOT DETECTED NOT DETECTED Final   Rhinovirus / Enterovirus NOT DETECTED NOT DETECTED Final   Influenza A NOT DETECTED NOT DETECTED Final   Influenza B NOT DETECTED NOT DETECTED Final   Parainfluenza Virus 1 NOT DETECTED NOT DETECTED Final   Parainfluenza Virus 2 NOT DETECTED NOT DETECTED Final   Parainfluenza Virus 3 NOT DETECTED NOT DETECTED Final   Parainfluenza Virus 4 NOT DETECTED NOT DETECTED Final   Respiratory Syncytial Virus DETECTED (A) NOT DETECTED Final    Comment: CRITICAL RESULT CALLED TO, READ BACK BY AND VERIFIED WITH: S CRADDOCK RN 1829 05/11/17 A BROWNING    Bordetella pertussis NOT DETECTED NOT  DETECTED Final   Chlamydophila pneumoniae NOT DETECTED NOT DETECTED Final   Mycoplasma pneumoniae NOT DETECTED NOT DETECTED Final    Comment: Performed at [facility], [address] [place], [place] [address]         Radiology Studies: Dg Chest 2 View  Result Date: 05/11/2017 CLINICAL DATA:  Shortness of breath. Preop aortobifemoral bypass graft. EXAM: CHEST  2 VIEW COMPARISON:  01/19/2017 FINDINGS: Mild cardiomegaly is similar to prior exam. Chronic hyperinflation with interstitial coarsening and suspected emphysema. Minimal fluid in the fissures, similar to prior exam. Linear scarring in the right upper lung zone. No confluent consolidation. Blunting of the costophrenic angles felt to be secondary to hyperinflation. No pneumothorax. No acute osseous abnormality. IMPRESSION: 1. No acute abnormality. 2. Unchanged hyperinflation and emphysema. Unchanged mild cardiomegaly. Electronically Signed   By: Rubye Oaks M.D.   On: 05/11/2017 07:01   Ct Angio Chest Pe W And/or Wo Contrast  Result Date: 05/11/2017 CLINICAL DATA:  Shortness of breath.  Lower extremity edema.  Cough. EXAM: CT  ANGIOGRAPHY CHEST WITH CONTRAST TECHNIQUE: Multidetector CT imaging of the chest was performed using the standard protocol during bolus administration of intravenous contrast. Multiplanar CT image reconstructions and MIPs were obtained to evaluate the vascular anatomy. CONTRAST:  56 mL ISOVUE-370 IOPAMIDOL (ISOVUE-370) INJECTION 76% COMPARISON:  Chest x-ray dated 05/11/2017 FINDINGS: Cardiovascular: There are no pulmonary emboli. There is borderline cardiomegaly. Extensive coronary artery calcification. Aortic atherosclerosis. Mediastinum/Nodes: No enlarged mediastinal, hilar, or axillary lymph nodes. Thyroid gland, trachea, and esophagus demonstrate no significant findings. Lungs/Pleura: Chronic interstitial and obstructive lung disease. Peribronchial thickening. No consolidative infiltrates or effusions. Upper Abdomen: No acute abnormality. Musculoskeletal: No chest wall abnormality. No acute or significant osseous findings. Review of the MIP images confirms the above findings. IMPRESSION: 1. No pulmonary emboli. 2. Aortic Atherosclerosis (ICD10-I70.0) and Emphysema (ICD10-J43.9). 3. Chronic interstitial disease with peribronchial thickening which is probably chronic as well. Electronically Signed   By: Francene Boyers M.D.   On: 05/11/2017 11:20        Scheduled Meds: . aspirin EC  81 mg Oral Daily  . enoxaparin (LOVENOX) injection  40 mg Subcutaneous Q24H  . folic acid  1 mg Oral Daily  . furosemide  20 mg Oral Daily  . guaiFENesin  600 mg Oral BID  . ipratropium-albuterol  3 mL Nebulization TID  . methylPREDNISolone (SOLU-MEDROL) injection  60 mg Intravenous Q6H  . multivitamin with minerals  1 tablet Oral Daily  . nicotine  21 mg Transdermal Daily  . pantoprazole  40 mg Oral Daily  . sacubitril-valsartan  1 tablet Oral BID  . thiamine  100 mg Oral Daily   Or  . thiamine  100 mg Intravenous Daily   Continuous Infusions: . sodium chloride 50 mL/hr at 05/11/17 2345  . doxycycline  (VIBRAMYCIN) IV 100 mg (05/12/17 0835)     LOS: 1 day    Time spent: 35 minutes    Mayson Mcneish,LAWAL, MD [facility] Pager [phone] If 7PM-7AM, please contact night-coverage [email or web address] Password [email or web address] 05/12/2017, 9:04 AM

## 2017-05-12 NOTE — Progress Notes (Signed)
Initial Nutrition Assessment  DOCUMENTATION CODES:   Non-severe (moderate) malnutrition in context of chronic illness  INTERVENTION:   -Ensure Enlive po BID, each supplement provides 350 kcal and 20 grams of protein  NUTRITION DIAGNOSIS:   Moderate Malnutrition related to chronic illness(COPD, CHF) as evidenced by energy intake < 75% for > or equal to 1 month, mild fat depletion, mild muscle depletion, moderate muscle depletion.  GOAL:   Patient will meet greater than or equal to 90% of their needs  MONITOR:   PO intake, Supplement acceptance, Labs, Weight trends, Skin, I & O's  REASON FOR ASSESSMENT:   Consult COPD Protocol  ASSESSMENT:   Wayne [name] is a 62 y.o. male with a Past Medical History of HTN, HLD, COPD (not on home O2), and CHF who presented for preop for PVD repair (scheduled for aortobifemoral graft) and was found to have hypoxia, fever, cough, and SOB  Pt admitted with COPD exacerbation.   Spoke with pt at bedside, who reports fair appetite. He generally consumes 2 meals per day (Breakfast: oatmeal and eggs, Dinner: meat, starch, and vegetable). Pt reports he often does not feel hungry due to increased work of breathing and often forgets to eat. He did consume 100% of breakfast thus morning, per his report. He has not tried Ensure or Boost supplements, however, has contemplated using them PTA due to weight loss.   Pt endorses ongoing wt loss over the past several years related to both COPD and CHF. He shares it is difficulty to provide an accurate wt hx related to fluid retention. He reports he has been less active due to decreased endurance, however, is still able to perform his ADLs.   Discussed with pt ways to increase calories and protein to preserve lean body mass and promote weight gain; recommended small, frequent meals as well as mechanically altered foods for ease of intake. Offered to downgrade diet consistency, however, pt declined. Pt also amenable  to Ensure supplements.   Medications reviewed and include solu-medrol, folvite, lasix, and thiamine.   Labs reviewed: Na: 133  NUTRITION - FOCUSED PHYSICAL EXAM:    Most Recent Value  Orbital Region  Mild depletion  Upper Arm Region  Moderate depletion  Thoracic and Lumbar Region  Mild depletion  Buccal Region  Mild depletion  Temple Region  Mild depletion  Clavicle Bone Region  Mild depletion  Clavicle and Acromion Bone Region  Mild depletion  Scapular Bone Region  Mild depletion  Dorsal Hand  Moderate depletion  Patellar Region  Moderate depletion  Anterior Thigh Region  Moderate depletion  Posterior Calf Region  Moderate depletion  Edema (RD Assessment)  None  Hair  Reviewed  Eyes  Reviewed  Mouth  Reviewed  Skin  Reviewed  Nails  Reviewed       Diet Order:  Diet Heart Room service appropriate? Yes; Fluid consistency: Thin  EDUCATION NEEDS:   Education needs have been addressed  Skin:  Skin Assessment: Reviewed RN Assessment  Last BM:  05/10/17  Height:   Ht Readings from Last 1 Encounters:  05/11/17 5\' 3"  (1.6 m)    Weight:   Wt Readings from Last 1 Encounters:  05/12/17 107 lb 11.2 oz (48.9 kg)    Ideal Body Weight:  56.4 kg  BMI:  Body mass index is 19.08 kg/m.  Estimated Nutritional Needs:   Kcal:  1750-1950  Protein:  90-105 grams  Fluid:  1.7-1.9 L    [name] A. Mayford Knife, RD, LDN, CDE Pager:  [phone] After hours Pager: [phone]

## 2017-05-13 ENCOUNTER — Other Ambulatory Visit: Payer: Self-pay | Admitting: *Deleted

## 2017-05-13 NOTE — Progress Notes (Signed)
Patient ID: Wayne [name], male   DOB: Jun 14, 1955, 62 y.o.   MRN: [id number]  PROGRESS NOTE    Wayne [name]  WUJ:[id number] DOB: April 11, 1955 DOA: 05/11/2017 PCP: Elinor Dodge, MD    Brief Narrative:  Wayne [name] is a very pleasant 62 y.o. male with medical history significant due to aortoiliac disease since emergency department from preop as he was scheduled to have aortobifemoral graft today but was found to have hypoxia, fever, cough and worsening shortness of breath while in preop. Initial evaluation in the emergency department reveals acute respiratory failure with hypoxia likely related to COPD exacerbation in the setting of possible viral infection    Assessment & Plan:   Principal Problem:   Acute respiratory failure with hypoxia (HCC) Active Problems:   Critical lower limb ischemia   Chronic systolic heart failure (HCC)   Degenerative disc disease, lumbar   Hyperlipidemia   Hyponatremia   Tobacco abuse   Hypertension   COPD with acute exacerbation (HCC)   Fever   ETOH abuse   Malnutrition of moderate degree   #1 acute on chronic Respiratory failure: Patient is improving but still hypoxic. He is responding to treatment. Suspected due to COPD exacerbation. Continue Solu-Medrol with nebulizer and antibiotics. Evaluate patient for home oxygen.   #2 acute exacerbation of COPD: Continue treatment as above. No evidence of pneumonia. Continue antitussives and oxygen.   #3 hypertension: Continue blood pressure control.   #4 chronic systolic CHF: Patient appears compensated on Lasix. Continue monitoring on telemetry.  #5 tobacco abuse: Counseling provided.  #6 hyperlipidemia: Continue with statin.  #7 history of alcohol abuse: No recent alcohol abuse. Monitor with CIWA protocol if needed  #8 peripheral vascular disease: Patient has critical limb ischemia on the left with rest pain. Scheduled for ABF but due to respiratory compromise it is being placed on  hold.   DVT prophylaxis:  Lovenox  Code Status: Full  Family Communication: Sister  Disposition Plan: Home  Consultants:  None Procedures: none  Antimicrobials:  Doxycycline   Subjective:Patient is breathing a little better. Still has baseline crackles and bilateral wheeze. No leg pain today   Objective: Vitals:   05/12/17 2012 05/12/17 2152 05/13/17 0444 05/13/17 1336  BP:  102/82 137/77 (!) 152/85  Pulse: 78 88 91   Resp: 19 18 18 18   Temp:  (!) 97.4 F (36.3 C) 97.6 F (36.4 C) 97.8 F (36.6 C)  TempSrc:  Oral Oral Oral  SpO2: 95% 96% 94% 96%  Weight:   49.9 kg (110 lb 1.6 oz)   Height:        Intake/Output Summary (Last 24 hours) at 05/13/2017 1639 Last data filed at 05/13/2017 1337 Gross per 24 hour  Intake 850 ml  Output 1250 ml  Net -400 ml   Filed Weights   05/11/17 1618 05/12/17 0550 05/13/17 0444  Weight: 49.1 kg (108 lb 5 oz) 48.9 kg (107 lb 11.2 oz) 49.9 kg (110 lb 1.6 oz)    Examination:  General exam: Appears calm and comfortable  Respiratory system: decreased air entry bilateral with some marked extremity wheezing Cardiovascular system: S1 & S2 heard, RRR. No JVD, murmurs, rubs, gallops or clicks. No pedal edema. Gastrointestinal system: Abdomen is nondistended, soft and nontender. No organomegaly or masses felt. Normal bowel sounds heard. Central nervous system: Alert and oriented. No focal neurological deficits. Extremities: Symmetric 5 x 5 power. Skin: No rashes, lesions or ulcers Psychiatry: Judgement and insight appear normal. Mood & affect  appropriate.     Data Reviewed: I have personally reviewed following labs and imaging studies  CBC: Recent Labs  Lab 05/11/17 0636 05/11/17 0808 05/12/17 0711  WBC 5.2 4.8 2.4*  NEUTROABS  --  3.5  --   HGB 16.2 15.5 17.2*  HCT 46.8 45.6 50.3  MCV 101.1* 101.3* 101.4*  PLT 162 170 168   Basic Metabolic Panel: Recent Labs  Lab 05/11/17 0636 05/11/17 0808 05/11/17 1001 05/12/17 0711    NA 131* 133*  --  133*  K 3.9 4.6  --  4.0  CL 98* 100*  --  103  CO2 20* 22  --  19*  GLUCOSE 87 90  --  162*  BUN 5* 5*  --  8  CREATININE 0.89 0.89  --  0.86  CALCIUM 8.6* 8.7*  --  8.5*  MG  --   --  2.1  --    GFR: Estimated Creatinine Clearance: 62.1 mL/min (by C-G formula based on SCr of 0.86 mg/dL). Liver Function Tests: Recent Labs  Lab 05/11/17 0636 05/11/17 0808  AST 36 39  ALT 20 20  ALKPHOS 95 92  BILITOT 1.3* 1.2  PROT 6.9 6.7  ALBUMIN 3.5 3.3*   No results for input(s): LIPASE, AMYLASE in the last 168 hours. No results for input(s): AMMONIA in the last 168 hours. Coagulation Profile: Recent Labs  Lab 05/11/17 0636  INR 1.32   Cardiac Enzymes: No results for input(s): CKTOTAL, CKMB, CKMBINDEX, TROPONINI in the last 168 hours. BNP (last 3 results) No results for input(s): PROBNP in the last 8760 hours. HbA1C: No results for input(s): HGBA1C in the last 72 hours. CBG: Recent Labs  Lab 05/11/17 0630  GLUCAP 84   Lipid Profile: No results for input(s): CHOL, HDL, LDLCALC, TRIG, CHOLHDL, LDLDIRECT in the last 72 hours. Thyroid Function Tests: No results for input(s): TSH, T4TOTAL, FREET4, T3FREE, THYROIDAB in the last 72 hours. Anemia Panel: No results for input(s): VITAMINB12, FOLATE, FERRITIN, TIBC, IRON, RETICCTPCT in the last 72 hours. Urine analysis:    Component Value Date/Time   COLORURINE YELLOW 05/11/2017 0808   APPEARANCEUR CLEAR 05/11/2017 0808   LABSPEC 1.009 05/11/2017 0808   PHURINE 7.0 05/11/2017 0808   GLUCOSEU NEGATIVE 05/11/2017 0808   HGBUR MODERATE (A) 05/11/2017 0808   BILIRUBINUR NEGATIVE 05/11/2017 0808   KETONESUR NEGATIVE 05/11/2017 0808   PROTEINUR 100 (A) 05/11/2017 0808   NITRITE NEGATIVE 05/11/2017 0808   LEUKOCYTESUR NEGATIVE 05/11/2017 0808   Sepsis Labs: @LABRCNTIP (procalcitonin:4,lacticidven:4)  ) Recent Results (from the past 240 hour(s))  Urine culture     Status: None   Collection Time: 05/11/17   8:08 AM  Result Value Ref Range Status   Specimen Description URINE, RANDOM  Final   Special Requests NONE  Final   Culture   Final    NO GROWTH Performed at [facility], [address] [place], [place] [address]    Report Status 05/12/2017 FINAL  Final  Respiratory Panel by PCR     Status: Abnormal   Collection Time: 05/11/17  8:08 AM  Result Value Ref Range Status   Adenovirus NOT DETECTED NOT DETECTED Final   Coronavirus 229E NOT DETECTED NOT DETECTED Final   Coronavirus HKU1 NOT DETECTED NOT DETECTED Final   Coronavirus NL63 NOT DETECTED NOT DETECTED Final   Coronavirus OC43 NOT DETECTED NOT DETECTED Final   Metapneumovirus NOT DETECTED NOT DETECTED Final   Rhinovirus / Enterovirus NOT DETECTED NOT DETECTED Final   Influenza  A NOT DETECTED NOT DETECTED Final   Influenza B NOT DETECTED NOT DETECTED Final   Parainfluenza Virus 1 NOT DETECTED NOT DETECTED Final   Parainfluenza Virus 2 NOT DETECTED NOT DETECTED Final   Parainfluenza Virus 3 NOT DETECTED NOT DETECTED Final   Parainfluenza Virus 4 NOT DETECTED NOT DETECTED Final   Respiratory Syncytial Virus DETECTED (A) NOT DETECTED Final    Comment: CRITICAL RESULT CALLED TO, READ BACK BY AND VERIFIED WITH: S CRADDOCK RN 1829 05/11/17 A BROWNING    Bordetella pertussis NOT DETECTED NOT DETECTED Final   Chlamydophila pneumoniae NOT DETECTED NOT DETECTED Final   Mycoplasma pneumoniae NOT DETECTED NOT DETECTED Final    Comment: Performed at [facility], [address] [place], [place] [address]  Blood culture (routine x 2)     Status: None (Preliminary result)   Collection Time: 05/11/17  8:35 AM  Result Value Ref Range Status   Specimen Description BLOOD RIGHT ANTECUBITAL  Final   Special Requests IN PEDIATRIC BOTTLE Blood Culture adequate volume  Final   Culture   Final    NO GROWTH 2 DAYS Performed at [facility], [address] [place], [place] [address]    Report Status PENDING  Incomplete  Blood  culture (routine x 2)     Status: None (Preliminary result)   Collection Time: 05/11/17  8:58 AM  Result Value Ref Range Status   Specimen Description BLOOD LEFT ANTECUBITAL  Final   Special Requests   Final    BOTTLES DRAWN AEROBIC AND ANAEROBIC Blood Culture adequate volume   Culture   Final    NO GROWTH 2 DAYS Performed at [facility], [address] [place], [place] [address]    Report Status PENDING  Incomplete         Radiology Studies: No results found.      Scheduled Meds: . aspirin EC  81 mg Oral Daily  . enoxaparin (LOVENOX) injection  40 mg Subcutaneous Q24H  . feeding supplement (ENSURE ENLIVE)  237 mL Oral BID BM  . folic acid  1 mg Oral Daily  . furosemide  20 mg Oral Daily  . guaiFENesin  600 mg Oral BID  . methylPREDNISolone (SOLU-MEDROL) injection  60 mg Intravenous Q6H  . multivitamin with minerals  1 tablet Oral Daily  . nicotine  21 mg Transdermal Daily  . pantoprazole  40 mg Oral Daily  . sacubitril-valsartan  1 tablet Oral BID  . thiamine  100 mg Oral Daily   Continuous Infusions: . doxycycline (VIBRAMYCIN) IV Stopped (05/13/17 1258)     LOS: 2 days    Time spent: 35 minutes    Wayne [name],LAWAL, MD [facility] Pager [phone] If 7PM-7AM, please contact night-coverage [email or web address] Password [facility] 05/13/2017, 4:39 PM

## 2017-05-14 DIAGNOSIS — E871 Hypo-osmolality and hyponatremia: Secondary | ICD-10-CM

## 2017-05-14 DIAGNOSIS — I998 Other disorder of circulatory system: Secondary | ICD-10-CM

## 2017-05-14 DIAGNOSIS — I5022 Chronic systolic (congestive) heart failure: Secondary | ICD-10-CM

## 2017-05-14 DIAGNOSIS — E440 Moderate protein-calorie malnutrition: Secondary | ICD-10-CM

## 2017-05-14 DIAGNOSIS — J441 Chronic obstructive pulmonary disease with (acute) exacerbation: Principal | ICD-10-CM

## 2017-05-14 DIAGNOSIS — Z720 Tobacco use: Secondary | ICD-10-CM

## 2017-05-14 DIAGNOSIS — F1010 Alcohol abuse, uncomplicated: Secondary | ICD-10-CM

## 2017-05-14 DIAGNOSIS — R0902 Hypoxemia: Secondary | ICD-10-CM

## 2017-05-14 DIAGNOSIS — I10 Essential (primary) hypertension: Secondary | ICD-10-CM

## 2017-05-14 MED ORDER — GUAIFENESIN ER 600 MG PO TB12: 600.0000 mg | ORAL_TABLET | Freq: Two times a day (BID) | ORAL | 0 refills | Status: DC

## 2017-05-14 MED ORDER — DOXYCYCLINE HYCLATE 50 MG PO CAPS: 50.0000 mg | ORAL_CAPSULE | Freq: Two times a day (BID) | ORAL | 0 refills | Status: DC

## 2017-05-14 MED ORDER — FUROSEMIDE 20 MG PO TABS: 20.0000 mg | ORAL_TABLET | Freq: Every day | ORAL | 0 refills | Status: AC

## 2017-05-14 MED ORDER — OMEPRAZOLE 20 MG PO CPDR: 20.0000 mg | DELAYED_RELEASE_CAPSULE | Freq: Every day | ORAL | 0 refills | Status: AC | PRN

## 2017-05-14 MED ORDER — NICOTINE 21 MG/24HR TD PT24: 21.0000 mg | MEDICATED_PATCH | Freq: Every day | TRANSDERMAL | 0 refills | Status: DC

## 2017-05-14 MED ORDER — FOLIC ACID 1 MG PO TABS: 1.0000 mg | ORAL_TABLET | Freq: Every day | ORAL | 0 refills | Status: DC

## 2017-05-14 MED ORDER — ADULT MULTIVITAMIN W/MINERALS CH: 1.0000 | ORAL_TABLET | Freq: Every day | ORAL | 0 refills | Status: DC

## 2017-05-14 MED ORDER — ENSURE ENLIVE PO LIQD: 237.0000 mL | Freq: Two times a day (BID) | ORAL | 0 refills | Status: DC

## 2017-05-14 MED ORDER — PREDNISONE 20 MG PO TABS: ORAL_TABLET | ORAL | 0 refills | Status: DC

## 2017-05-14 MED ORDER — THIAMINE HCL 100 MG PO TABS: 100.0000 mg | ORAL_TABLET | Freq: Every day | ORAL | 0 refills | Status: DC

## 2017-05-14 NOTE — Plan of Care (Signed)
  Clinical Measurements: Ability to maintain clinical measurements within normal limits will improve 05/14/2017 0108 - Progressing by Elnita Maxwell, RN   Nutrition: Adequate nutrition will be maintained 05/14/2017 0108 - Progressing by Elnita Maxwell, RN   Elimination: Will not experience complications related to bowel motility 05/14/2017 0108 - Progressing by Elnita Maxwell, RN   Cardiac: Ability to achieve and maintain adequate cardiopulmonary perfusion will improve 05/14/2017 0108 - Progressing by Elnita Maxwell, RN   Activity: Capacity to carry out activities will improve 05/14/2017 0108 - Progressing by Elnita Maxwell, RN   Education: Ability to verbalize understanding of medication therapies will improve 05/14/2017 0108 - Progressing by Elnita Maxwell, RN

## 2017-05-14 NOTE — Discharge Summary (Addendum)
Physician Discharge Summary  Wayne [name] VHQ:[id number] DOB: [birth year] DOA: 05/11/2017  PCP: Elinor Dodge, MD  Admit date: 05/11/2017 Discharge date: 05/14/2017  Time spent: 45 minutes  Recommendations for Outpatient Follow-up:  Patient will be discharged to home.  Patient will need to follow up with primary care provider within one week of discharge.  Follow up with Dr. Imogene Burn, vascular surgery. Patient should continue medications as prescribed.  Patient should follow a heart healthy diet.   Discharge Diagnoses:  Acute on chronic respiratory failure  Acute COPD exacerbation Essential Hypertension Chronic systolic congestive heart failure Tobacco abuse Alcohol abuse Peripheral vascular disease/Critical limb ischemia Hyponatremia Moderate malnutrition  Discharge Condition: Stable  Diet recommendation: heart healthy  Filed Weights   05/12/17 0550 05/13/17 0444 05/14/17 0513  Weight: 48.9 kg (107 lb 11.2 oz) 49.9 kg (110 lb 1.6 oz) 50.4 kg (111 lb 1.6 oz)    History of present illness:  On 05/11/2017 by Ms. Toya Smothers, NP Wayne [name] is a very pleasant 62 y.o. male with medical history significant due to aortoiliac disease since emergency department from preop as he was scheduled to have aortobifemoral graft today but was found to have hypoxia, fever, cough and worsening shortness of breath while in preop. Initial evaluation in the emergency department reveals acute respiratory failure with hypoxia likely related to COPD exacerbation in the setting of possible viral infection. [facility] are asked to admit  Information is obtained from the chart and the patient. He states 3 days ago he developed cough with worsening shortness of breath. He does not wear oxygen at home but does use his inhalers. He states he uses them without relief. Associated symptoms include subjective fever chills decreased oral intake. He denies headache dizziness syncope or near-syncope.  He reports he is coughing much more with it but it's nonproductive. Denies abdominal pain nausea vomiting diarrhea constipation melena bright red blood per rectum. He denies dysuria hematuria frequency or urgency. He does report chest pain gently with coughing and movement.  Hospital Course:  Acute on chronic respiratory failure  -secondary to COPD Exac and RSV -Influenza PCR negative, respiratory viral panel positive for RSV -Patient presented with worsening dyspnea and nonproductive cough, found to have oxygen saturations in the mid 80s on admission -Chest x-ray on admission showed emphysema however no infection -Initially placed on Solu-Medrol, oxygen supplementation, nebulizers, doxycycline -CTA chest was negative for pulmonary embolism -weaned off of supplemental oxygen. Currently maintaining oxygen saturations in the 90s on room air at rest.   Acute COPD exacerbation -likely secondary to RSV -was placed on doxycycline, solumedrol, nebs, antitussives -appears to have improved -patient will be discharged with prednisone and doxycycline  Essential Hypertension -Continue Lasix, Entresto  Chronic systolic congestive heart failure -Appears to be compensated and euvolemic -Per review of CareEverywhere-echocardiogram 09/18/2015 showed an EF of 20-25% -Continue lasix and Entresto  Tobacco abuse -Discussed smoking cessation -Continue nicotine patch  Alcohol abuse -Admits to drinking beer -No signs of withdrawal -Was placed on CIWA -Continue multivitamin, thiamine, folic acid  Peripheral vascular disease/Critical limb ischemia -secondary to severe aortoiliac disease -follows with vascular surgery-patient was scheduled for aortobifemoral graft, however was found to have respiratory failure- will need to be rescheduled -Continue statin, aspirin  Hyponatremia -Appears to be chronic, reviewed patient's labs from 2017-sodium was noted to be 130-133  Moderate malnutrition -In the  context of  chronic illness -Nutrition consulted and appreciated, continue feeding supplements  Procedures: None  Consultations: None  Discharge Exam: Vitals:  05/14/17 0513 05/14/17 1029  BP: 135/71 135/78  Pulse: 88 92  Resp: 18 20  Temp: 98 F (36.7 C)   SpO2: 96% 93%   Patient seen and examined on day of discharge.  Feels breathing has improved.  Continues to have occasional dry cough.  Denies any current chest pain, abdominal pain, nausea or vomiting, diarrhea or constipation.   General: Well developed, well nourished, NAD, appears stated age  HEENT: NCAT, mucous membranes moist.  Neck: Supple  Cardiovascular: S1 S2 auscultated, RRR, no rubs  Respiratory: Diminished breath sounds, few expiratory wheezes  Abdomen: Soft, nontender, nondistended, + bowel sounds  Extremities: warm dry without cyanosis clubbing or edema  Neuro: AAOx3, nonfocal  Psych: Normal affect and demeanor with intact judgement and insight  Discharge Instructions Discharge Instructions    Discharge instructions   Complete by:  As directed    Patient will be discharged to home.  Patient will need to follow up with primary care provider within one week of discharge.  Follow up with Dr. Imogene Burn, vascular surgery. Patient should continue medications as prescribed.  Patient should follow a heart healthy diet.     Allergies as of 05/14/2017      Reactions   Penicillins Itching, Rash, Other (See Comments)   PATIENT HAS HAD A PCN REACTION WITH IMMEDIATE RASH, FACIAL/TONGUE/THROAT SWELLING, SOB, OR LIGHTHEADEDNESS WITH HYPOTENSION:  #  #  #  YES  #  #  #   Has patient had a PCN reaction causing severe rash involving mucus membranes or skin necrosis: No Has patient had a PCN reaction that required hospitalization: No Has patient had a PCN reaction occurring within the last 10 years: No If all of the above answers are "NO", then may proceed with Cephalosporin use.      Medication List    TAKE these  medications   acetaminophen 500 MG tablet Commonly known as:  TYLENOL Take 2 tablets (1,000 mg total) by mouth every 6 (six) hours as needed. What changed:    how much to take  reasons to take this   albuterol 108 (90 Base) MCG/ACT inhaler Commonly known as:  PROVENTIL HFA;VENTOLIN HFA Inhale 2 puffs into the lungs every 6 (six) hours as needed for wheezing or shortness of breath.   aspirin EC 81 MG tablet Take 81 mg by mouth daily.   budesonide-formoterol 160-4.5 MCG/ACT inhaler Commonly known as:  SYMBICORT Inhale 2 puffs into the lungs 2 (two) times daily.   doxycycline 50 MG capsule Commonly known as:  VIBRAMYCIN Take 1 capsule (50 mg total) by mouth 2 (two) times daily. Completion of hospital course   ENTRESTO 97-103 MG Generic drug:  sacubitril-valsartan Take 1 tablet by mouth 2 (two) times daily.   feeding supplement (ENSURE ENLIVE) Liqd Take 237 mLs by mouth 2 (two) times daily between meals.   folic acid 1 MG tablet Commonly known as:  FOLVITE Take 1 tablet (1 mg total) by mouth daily. Start taking on:  05/15/2017   furosemide 20 MG tablet Commonly known as:  LASIX Take 1 tablet (20 mg total) by mouth daily. Start taking on:  05/15/2017 What changed:    when to take this  reasons to take this   guaiFENesin 600 MG 12 hr tablet Commonly known as:  MUCINEX Take 1 tablet (600 mg total) by mouth 2 (two) times daily.   HM DRY EYE RELIEF OP Place 1 drop into both eyes daily as needed (for dry eyes).   multivitamin with  minerals Tabs tablet Take 1 tablet by mouth daily. Start taking on:  05/15/2017   nicotine 21 mg/24hr patch Commonly known as:  NICODERM CQ - dosed in mg/24 hours Place 1 patch (21 mg total) onto the skin daily. Start taking on:  05/15/2017   omeprazole 20 MG capsule Commonly known as:  PRILOSEC Take 1 capsule (20 mg total) by mouth daily as needed (for indegestion).   ondansetron 4 MG disintegrating tablet Commonly known as:  ZOFRAN  ODT Take 1 tablet (4 mg total) by mouth every 8 (eight) hours as needed for nausea or vomiting. What changed:  reasons to take this   predniSONE 20 MG tablet Commonly known as:  DELTASONE Take in the morning: Take 3 tablets x 3days, then take 2 tablets x 3days, then take 1 tablet x 3days. Start taking on:  05/15/2017   thiamine 100 MG tablet Take 1 tablet (100 mg total) by mouth daily. Start taking on:  05/15/2017   tiZANidine 4 MG tablet Commonly known as:  ZANAFLEX Take 4 mg by mouth daily as needed for muscle spasms.      Allergies  Allergen Reactions  . Penicillins Itching, Rash and Other (See Comments)    PATIENT HAS HAD A PCN REACTION WITH IMMEDIATE RASH, FACIAL/TONGUE/THROAT SWELLING, SOB, OR LIGHTHEADEDNESS WITH HYPOTENSION:  #  #  #  YES  #  #  #   Has patient had a PCN reaction causing severe rash involving mucus membranes or skin necrosis: No Has patient had a PCN reaction that required hospitalization: No Has patient had a PCN reaction occurring within the last 10 years: No If all of the above answers are "NO", then may proceed with Cephalosporin use.       The results of significant diagnostics from this hospitalization (including imaging, microbiology, ancillary and laboratory) are listed below for reference.    Significant Diagnostic Studies: Dg Chest 2 View  Result Date: 05/11/2017 CLINICAL DATA:  Shortness of breath. Preop aortobifemoral bypass graft. EXAM: CHEST  2 VIEW COMPARISON:  01/19/2017 FINDINGS: Mild cardiomegaly is similar to prior exam. Chronic hyperinflation with interstitial coarsening and suspected emphysema. Minimal fluid in the fissures, similar to prior exam. Linear scarring in the right upper lung zone. No confluent consolidation. Blunting of the costophrenic angles felt to be secondary to hyperinflation. No pneumothorax. No acute osseous abnormality. IMPRESSION: 1. No acute abnormality. 2. Unchanged hyperinflation and emphysema. Unchanged mild  cardiomegaly. Electronically Signed   By: Rubye Oaks M.D.   On: 05/11/2017 07:01   Ct Angio Chest Pe W And/or Wo Contrast  Result Date: 05/11/2017 CLINICAL DATA:  Shortness of breath.  Lower extremity edema.  Cough. EXAM: CT ANGIOGRAPHY CHEST WITH CONTRAST TECHNIQUE: Multidetector CT imaging of the chest was performed using the standard protocol during bolus administration of intravenous contrast. Multiplanar CT image reconstructions and MIPs were obtained to evaluate the vascular anatomy. CONTRAST:  56 mL ISOVUE-370 IOPAMIDOL (ISOVUE-370) INJECTION 76% COMPARISON:  Chest x-ray dated 05/11/2017 FINDINGS: Cardiovascular: There are no pulmonary emboli. There is borderline cardiomegaly. Extensive coronary artery calcification. Aortic atherosclerosis. Mediastinum/Nodes: No enlarged mediastinal, hilar, or axillary lymph nodes. Thyroid gland, trachea, and esophagus demonstrate no significant findings. Lungs/Pleura: Chronic interstitial and obstructive lung disease. Peribronchial thickening. No consolidative infiltrates or effusions. Upper Abdomen: No acute abnormality. Musculoskeletal: No chest wall abnormality. No acute or significant osseous findings. Review of the MIP images confirms the above findings. IMPRESSION: 1. No pulmonary emboli. 2. Aortic Atherosclerosis (ICD10-I70.0) and Emphysema (ICD10-J43.9). 3. Chronic interstitial  disease with peribronchial thickening which is probably chronic as well. Electronically Signed   By: Francene Boyers M.D.   On: 05/11/2017 11:20   Ct Angio Abdomen Pelvis  W &/or Wo Contrast  Result Date: 04/28/2017 CLINICAL DATA:  Atherosclerosis EXAM: CTA ABDOMEN AND PELVIS wITHOUT AND WITH CONTRAST TECHNIQUE: Multidetector CT imaging of the abdomen and pelvis was performed using the standard protocol during bolus administration of intravenous contrast. Multiplanar reconstructed images and MIPs were obtained and reviewed to evaluate the vascular anatomy. CONTRAST:  75mL  ISOVUE-370 IOPAMIDOL (ISOVUE-370) INJECTION 76% COMPARISON:  04/29/2010 FINDINGS: VASCULAR Aorta: Diffuse atherosclerotic calcifications and some smooth plaque is seen throughout the lower thoracic and abdominal aorta. It is nonaneurysmal. Celiac: Patent.  Branch vessels patent. SMA: Patent. Mild atherosclerotic calcification at the origin and mid SMA. Branch vessels grossly patent. Renals: Single renal arteries are patent with atherosclerotic calcification at the origins. IMA: Moderate disease at the origin. Diminutive vessel. Branch vessels grossly patent. Inflow: There are stents in the right common and external iliac arteries. Right common iliac artery is occluded. Right external iliac artery is occluded. Right internal iliac artery main trunk is occluded. Distal branches reconstitute. There is severe narrowing at the origin of the left common iliac artery with calcified and soft plaque causing narrowing for a length of 2 cm. The distal left common iliac artery is patent to the bifurcation. There is again noted to be significant but moderate narrowing at the origin of the left external iliac artery. The origin of the internal iliac artery is occluded. Branch vessels reconstitute. Proximal Outflow: Bilateral superficial femoral artery occlusion. Veins: No obvious DVT. Contrast reflux into the hepatic veins and IVC indicates an element of right heart dysfunction and elevated right heart pressures. The right atrium is also dilated. Review of the MIP images confirms the above findings. NON-VASCULAR Lower chest: No acute abnormality. Hepatobiliary: No acute focal abnormality of the liver. Gallbladder is unremarkable. Pancreas: Unremarkable Spleen: Unremarkable Adrenals/Urinary Tract: Tiny cyst in the medial lower pole of the left kidney is stable. Right kidney and adrenal glands are unremarkable. Bladder is decompressed. Stomach/Bowel: Stomach and duodenum are within normal limits. There is no evidence of small-bowel  obstruction. Prominent stool burden throughout the colon. No obvious mass in the colon. Lymphatic: No abnormal retroperitoneal adenopathy. Reproductive: Prostate is within normal limits. Other: Small amount of free fluid in the pelvis is nonspecific. Musculoskeletal: No vertebral compression deformity. IMPRESSION: VASCULAR Diffuse atherosclerotic changes of the aorta without aneurysm. 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 This has progressed. Significant narrowing at the origins of the left common and external iliac arteries. Stable at the origin of the left common iliac artery and progressed at the origin of the left external iliac artery. Right superficial femoral artery occlusion is new. Left superficial femoral artery occlusion is stable. NON-VASCULAR Small amount of free fluid in the pelvis is nonspecific. Electronically Signed   By: Jolaine Click M.D.   On: 04/28/2017 12:46    Microbiology: Recent Results (from the past 240 hour(s))  Urine culture     Status: None   Collection Time: 05/11/17  8:08 AM  Result Value Ref Range Status   Specimen Description URINE, RANDOM  Final   Special Requests NONE  Final   Culture   Final    NO GROWTH Performed at [facility], [address], [place], [place] [address]    Report Status 05/12/2017 FINAL  Final  Respiratory Panel by PCR  Status: Abnormal   Collection Time: 05/11/17  8:08 AM  Result Value Ref Range Status   Adenovirus NOT DETECTED NOT DETECTED Final   Coronavirus 229E NOT DETECTED NOT DETECTED Final   Coronavirus HKU1 NOT DETECTED NOT DETECTED Final   Coronavirus NL63 NOT DETECTED NOT DETECTED Final   Coronavirus OC43 NOT DETECTED NOT DETECTED Final   Metapneumovirus NOT DETECTED NOT DETECTED Final   Rhinovirus / Enterovirus NOT DETECTED NOT DETECTED Final   Influenza A NOT DETECTED NOT DETECTED Final   Influenza B NOT DETECTED NOT DETECTED Final   Parainfluenza Virus 1 NOT DETECTED NOT DETECTED Final    Parainfluenza Virus 2 NOT DETECTED NOT DETECTED Final   Parainfluenza Virus 3 NOT DETECTED NOT DETECTED Final   Parainfluenza Virus 4 NOT DETECTED NOT DETECTED Final   Respiratory Syncytial Virus DETECTED (A) NOT DETECTED Final    Comment: CRITICAL RESULT CALLED TO, READ BACK BY AND VERIFIED WITH: S CRADDOCK RN 1829 05/11/17 A BROWNING    Bordetella pertussis NOT DETECTED NOT DETECTED Final   Chlamydophila pneumoniae NOT DETECTED NOT DETECTED Final   Mycoplasma pneumoniae NOT DETECTED NOT DETECTED Final    Comment: Performed at [facility], [address], [place], [place] [address]  Blood culture (routine x 2)     Status: None (Preliminary result)   Collection Time: 05/11/17  8:35 AM  Result Value Ref Range Status   Specimen Description BLOOD RIGHT ANTECUBITAL  Final   Special Requests IN PEDIATRIC BOTTLE Blood Culture adequate volume  Final   Culture   Final    NO GROWTH 2 DAYS Performed at [facility], [address], [place], [place] [address]    Report Status PENDING  Incomplete  Blood culture (routine x 2)     Status: None (Preliminary result)   Collection Time: 05/11/17  8:58 AM  Result Value Ref Range Status   Specimen Description BLOOD LEFT ANTECUBITAL  Final   Special Requests   Final    BOTTLES DRAWN AEROBIC AND ANAEROBIC Blood Culture adequate volume   Culture   Final    NO GROWTH 2 DAYS Performed at [facility], [address] [place], [place] [address]    Report Status PENDING  Incomplete     Labs: Basic Metabolic Panel: Recent Labs  Lab 05/11/17 0636 05/11/17 0808 05/11/17 1001 05/12/17 0711  NA 131* 133*  --  133*  K 3.9 4.6  --  4.0  CL 98* 100*  --  103  CO2 20* 22  --  19*  GLUCOSE 87 90  --  162*  BUN 5* 5*  --  8  CREATININE 0.89 0.89  --  0.86  CALCIUM 8.6* 8.7*  --  8.5*  MG  --   --  2.1  --    Liver Function Tests: Recent Labs  Lab 05/11/17 0636 05/11/17 0808  AST 36 39  ALT 20 20  ALKPHOS 95 92  BILITOT 1.3* 1.2   PROT 6.9 6.7  ALBUMIN 3.5 3.3*   No results for input(s): LIPASE, AMYLASE in the last 168 hours. No results for input(s): AMMONIA in the last 168 hours. CBC: Recent Labs  Lab 05/11/17 0636 05/11/17 0808 05/12/17 0711  WBC 5.2 4.8 2.4*  NEUTROABS  --  3.5  --   HGB 16.2 15.5 17.2*  HCT 46.8 45.6 50.3  MCV 101.1* 101.3* 101.4*  PLT 162 170 168   Cardiac Enzymes: No results for input(s): CKTOTAL, CKMB, CKMBINDEX,  TROPONINI in the last 168 hours. BNP: BNP (last 3 results) Recent Labs    05/11/17 0808  BNP 1,284.4*    ProBNP (last 3 results) No results for input(s): PROBNP in the last 8760 hours.  CBG: Recent Labs  Lab 05/11/17 0630  GLUCAP 84       Signed:  Temitayo Covalt  [facility] 05/14/2017, 12:04 PM

## 2017-05-14 NOTE — Progress Notes (Signed)
Patient ambulated 157ft without any shortness of breath, with oxygen levels sustaining above 93%. Elnita Maxwell, RN

## 2017-05-16 LAB — CULTURE, BLOOD (ROUTINE X 2)
Culture: NO GROWTH
Culture: NO GROWTH
SPECIAL REQUESTS: ADEQUATE
Special Requests: ADEQUATE

## 2017-05-19 ENCOUNTER — Telehealth: Payer: Self-pay | Admitting: *Deleted

## 2017-05-19 ENCOUNTER — Other Ambulatory Visit: Payer: Self-pay | Admitting: *Deleted

## 2017-05-19 NOTE — Telephone Encounter (Signed)
Call to patient with rescheduled OR date. Instructed to be at [facility] hospital admitting at 5:30 am on 06/01/17. NPO past MN night prior and expect a call and follow instructions received from the hospital pre-admission department about this surgery. Verbalized understanding.

## 2017-05-24 NOTE — Pre-Procedure Instructions (Signed)
Wayne [name]  05/24/2017      [id number] - [place], [place] - [address] [place] [address] Phone: [phone] Fax: [phone]    Your procedure is scheduled on Tues., June 01, 2017  Report to [facility] Admitting Entrance "A" at 5:30AM   Call this number if you have problems the morning of surgery:  [phone]   Remember:  Do not eat food or drink liquids after midnight.  Take these medicines the morning of surgery with A SIP OF WATER: PredniSONE (DELTASONE). If needed Ondansetron (ZOFRAN ODT) for nausea, Omeprazole (PRILOSEC) for acid reflux, Budesonide-formoterol (SYMBICORT) for cough or wheezing, and Albuterol Inhaler for cough or wheezing (Bring with you the day of surgery).  Follow your doctor's instruction regarding Aspirin.  As of today, stop taking all Aspirins, Vitamins, Fish oils, and Herbal medications. Also stop all NSAIDS i.e. Advil, Ibuprofen, Motrin, Aleve, Anaprox, Naproxen, BC and Goody Powders.   Do not wear jewelry.  Do not wear lotions, powders, colognes, or deodorant.  Do not shave 48 hours prior to surgery.  Men may shave face and neck.  Do not bring valuables to the hospital.  [facility] is not responsible for any belongings or valuables.  Contacts, dentures or bridgework may not be worn into surgery.  Leave your suitcase in the car.  After surgery it may be brought to your room.  For patients admitted to the hospital, discharge time will be determined by your treatment team.  Patients discharged the day of surgery will not be allowed to drive home.   Special instructions:  [facility]- Preparing For Surgery  Before surgery, you can play an important role. Because skin is not sterile, your skin needs to be as free of germs as possible. You can reduce the number of germs on your skin by washing with CHG (chlorahexidine gluconate) Soap before surgery.  CHG is an antiseptic cleaner  which kills germs and bonds with the skin to continue killing germs even after washing.  Please do not use if you have an allergy to CHG or antibacterial soaps. If your skin becomes reddened/irritated stop using the CHG.  Do not shave (including legs and underarms) for at least 48 hours prior to first CHG shower. It is OK to shave your face.  Please follow these instructions carefully.   1. Shower the NIGHT BEFORE SURGERY and the MORNING OF SURGERY with CHG.   2. If you chose to wash your hair, wash your hair first as usual with your normal shampoo.  3. After you shampoo, rinse your hair and body thoroughly to remove the shampoo.  4. Use CHG as you would any other liquid soap. You can apply CHG directly to the skin and wash gently with a scrungie or a clean washcloth.   5. Apply the CHG Soap to your body ONLY FROM THE NECK DOWN.  Do not use on open wounds or open sores. Avoid contact with your eyes, ears, mouth and genitals (private parts). Wash Face and genitals (private parts)  with your normal soap.  6. Wash thoroughly, paying special attention to the area where your surgery will be performed.  7. Thoroughly rinse your body with warm water from the neck down.  8. DO NOT shower/wash with your normal soap after using and rinsing off the CHG Soap.  9. Pat yourself dry with a CLEAN TOWEL.  10. Wear CLEAN PAJAMAS to bed the night  before surgery, wear comfortable clothes the morning of surgery  11. Place CLEAN SHEETS on your bed the night of your first shower and DO NOT SLEEP WITH PETS.  Day of Surgery: Do not apply any deodorants/lotions. Please wear clean clothes to the hospital/surgery center.    Please read over the following fact sheets that you were given. Pain Booklet, Coughing and Deep Breathing, Blood Transfusion Information, MRSA Information and Surgical Site Infection Prevention

## 2017-05-25 ENCOUNTER — Encounter (HOSPITAL_COMMUNITY): Payer: Self-pay

## 2017-05-25 ENCOUNTER — Encounter (HOSPITAL_COMMUNITY)
Admission: RE | Admit: 2017-05-25 | Discharge: 2017-05-25 | Disposition: A | Discharge status: Home / Self Care | Payer: Medicaid Other | Source: Ambulatory Visit | Attending: Vascular Surgery | Admitting: Vascular Surgery

## 2017-05-25 ENCOUNTER — Other Ambulatory Visit: Payer: Self-pay

## 2017-05-25 DIAGNOSIS — I509 Heart failure, unspecified: Secondary | ICD-10-CM | POA: Insufficient documentation

## 2017-05-25 DIAGNOSIS — E7800 Pure hypercholesterolemia, unspecified: Secondary | ICD-10-CM | POA: Diagnosis not present

## 2017-05-25 DIAGNOSIS — R0609 Other forms of dyspnea: Secondary | ICD-10-CM | POA: Diagnosis not present

## 2017-05-25 DIAGNOSIS — R7303 Prediabetes: Secondary | ICD-10-CM | POA: Diagnosis not present

## 2017-05-25 DIAGNOSIS — D649 Anemia, unspecified: Secondary | ICD-10-CM | POA: Diagnosis not present

## 2017-05-25 DIAGNOSIS — J449 Chronic obstructive pulmonary disease, unspecified: Secondary | ICD-10-CM | POA: Insufficient documentation

## 2017-05-25 DIAGNOSIS — J9610 Chronic respiratory failure, unspecified whether with hypoxia or hypercapnia: Secondary | ICD-10-CM | POA: Diagnosis not present

## 2017-05-25 DIAGNOSIS — Z01812 Encounter for preprocedural laboratory examination: Secondary | ICD-10-CM | POA: Diagnosis not present

## 2017-05-25 DIAGNOSIS — K219 Gastro-esophageal reflux disease without esophagitis: Secondary | ICD-10-CM | POA: Insufficient documentation

## 2017-05-25 DIAGNOSIS — I4891 Unspecified atrial fibrillation: Secondary | ICD-10-CM | POA: Diagnosis not present

## 2017-05-25 DIAGNOSIS — I110 Hypertensive heart disease with heart failure: Secondary | ICD-10-CM | POA: Diagnosis not present

## 2017-05-25 DIAGNOSIS — Z79899 Other long term (current) drug therapy: Secondary | ICD-10-CM | POA: Diagnosis not present

## 2017-05-25 HISTORY — DX: Pulmonary hypertension, unspecified: I27.20

## 2017-05-25 LAB — PROTIME-INR
INR: 1.06
Prothrombin Time: 13.7 seconds (ref 11.4–15.2)

## 2017-05-25 LAB — COMPREHENSIVE METABOLIC PANEL
ALBUMIN: 3.1 g/dL — AB (ref 3.5–5.0)
ALT: 30 U/L (ref 17–63)
ANION GAP: 11 (ref 5–15)
AST: 37 U/L (ref 15–41)
Alkaline Phosphatase: 65 U/L (ref 38–126)
BUN: 5 mg/dL — ABNORMAL LOW (ref 6–20)
CO2: 24 mmol/L (ref 22–32)
Calcium: 8.9 mg/dL (ref 8.9–10.3)
Chloride: 100 mmol/L — ABNORMAL LOW (ref 101–111)
Creatinine, Ser: 0.79 mg/dL (ref 0.61–1.24)
GFR calc Af Amer: >60 mL/min (ref >60)
GFR calc non Af Amer: >60 mL/min (ref >60)
GLUCOSE: 72 mg/dL (ref 65–99)
POTASSIUM: 3.9 mmol/L (ref 3.5–5.1)
SODIUM: 135 mmol/L (ref 135–145)
Total Bilirubin: 0.9 mg/dL (ref 0.3–1.2)
Total Protein: 6.5 g/dL (ref 6.5–8.1)

## 2017-05-25 LAB — URINALYSIS, ROUTINE W REFLEX MICROSCOPIC
BILIRUBIN URINE: NEGATIVE
Glucose, UA: NEGATIVE mg/dL
HGB URINE DIPSTICK: NEGATIVE
KETONES UR: NEGATIVE mg/dL
Leukocytes, UA: NEGATIVE
Nitrite: NEGATIVE
PROTEIN: NEGATIVE mg/dL
Specific Gravity, Urine: 1.002 — ABNORMAL LOW (ref 1.005–1.030)
pH: 7 (ref 5.0–8.0)

## 2017-05-25 LAB — GLUCOSE, CAPILLARY: GLUCOSE-CAPILLARY: 139 mg/dL — AB (ref 65–99)

## 2017-05-25 LAB — CBC
HCT: 50.4 % (ref 39.0–52.0)
HEMOGLOBIN: 17.4 g/dL — AB (ref 13.0–17.0)
MCH: 34.9 pg — ABNORMAL HIGH (ref 26.0–34.0)
MCHC: 34.5 g/dL (ref 30.0–36.0)
MCV: 101 fL — ABNORMAL HIGH (ref 78.0–100.0)
Platelets: 187 10*3/uL (ref 150–400)
RBC: 4.99 MIL/uL (ref 4.22–5.81)
RDW: 14.5 % (ref 11.5–15.5)
WBC: 8.9 10*3/uL (ref 4.0–10.5)

## 2017-05-25 LAB — APTT: APTT: 26 s (ref 24–36)

## 2017-05-25 LAB — PREPARE RBC (CROSSMATCH)

## 2017-05-25 LAB — ABO/RH: ABO/RH(D): A POS

## 2017-05-25 LAB — SURGICAL PCR SCREEN
MRSA, PCR: NEGATIVE
Staphylococcus aureus: NEGATIVE

## 2017-05-25 NOTE — Progress Notes (Signed)
PCP - Dr. Mordecai Maes Cardiologist - Arnette Felts  Chest x-ray - 05/11/17 EKG - 05/11/17 Stress Test - 09/18/15 ECHO - 08/10/16 Cardiac Cath - pt denies  Pt is pre-diabetic, A1c 6.1 and does not check CBG at home.   Anesthesia review: pt recently hospitalized and sx cancelled d/t "bad cold" for which pt was hospitalized. Pt with history of MI but states he never had a cath?   Patient denies shortness of breath, fever, cough and chest pain at PAT appointment   Patient verbalized understanding of instructions that were given to them at the PAT appointment. Patient was also instructed that they will need to review over the PAT instructions again at home before surgery.

## 2017-05-26 NOTE — Progress Notes (Addendum)
Anesthesia Chart Review: Patient is a 62 year old male scheduled for AFBG on 06/01/17 by Dr. Leonides Sake. Procedure was initially scheduled for 05/11/17 (was a same day work-up), but when he arrived was noted to be dyspneic with fever. O2 sats in the high 80's on 4L/[place]. Patient was sent to the ED for evaluation/admission (COPD exacerbation, RSV) with plans to delay surgery for at least 2 weeks to allow patient to recover from respiratory illness.  History includes smoking, MI '04, CHF, HTN, COPD, hypercholesterolemia, PVD, prediabetes, GERD, dyspnea, anemia, arthritis, excision of left groin epidermal inclusion cyst 04/13/17. Recent discharge summary also lists alcohol abuse and moderate malnutrition. - Hospitalized 05/11/17-05/14/17 for COPD exacerbation with acute on chronic respiratory failure, likely secondary to RSV. CXR showed no infection. CTA negative for PE. He required short term O2 and was initially treated with Solu-Medrol, nebulizers and doxycycline.  - PCP is Dr. Sharlotte Alamo at [facility]). Last primary care follow-up was on 04/27/17 by Manual Meier, NP.   - Cardiologist is with [facility]). Primarily sees Roxanne Mins, PA-C. VVS notes indicate that patient has cardiac clearance, although office could not locate note. I did receive last cardiology records. Patient was seen by Arnette Felts, PA-C 04/07/17 for CHF follow-up. In review of records, patient has had known LV dysfunction (as low as 20-25% on 09/18/15) and pulmonary hypertension since at least 2017. There was no significant reversible ischemia or infarction on 2018 stress test. (Although there is no mention of alcohol induced cardiomyopathy, notes do mention history of heavy alcohol intake.) Notes indicate that he has not always been compliant with his CHF medication regimen (stopped taking Entresto). 3 month follow-up planned at his last visit.   - Last pulmonology note with Dr. Bethanie Dicker 04/06/17.  He is following for CHF, lung mass, COPD, pulmonary mycosis. His note states that patient is currently able to do activities of daily living without limitation and able to do housework without limitations.  Current treatment includes albuterol.  87-month follow-up planned with chest CT at the next visit.  Meds include albuterol, aspirin 81 mg, Lipitor (hasn't started), Symbicort, folic acid, Lasix, Prilosec, prednisone taper X 9 days (started 05/15/17), thiamine.  BP 132/68   Pulse 95   Temp 36.8 C (Oral)   Resp 20   Wt 111 lb 7 oz (50.5 kg)   SpO2 98%   BMI 19.74 kg/m   EKG 05/11/17: SR, LAE, consider biatrial enlargement. Borderline RAD. Borderline repolarization abnormality. ST changes in lateral and inferior leads. Inferolateral changes not as apparent on 04/01/17 tracing, but siminal on 02/17/16 tracing.   Echo 02/05/17: Conclusions: 1. There is moderate concentric LVH.   2. Overall left ventricular systolic function is mild to moderately impaired with an EF 40-45%.   3. Left atrium is normal in size and function.   4. The right ventricle is mildly enlarged measuring between 3.4-3.7 cm.   5. The right ventricular systolic function is mildly impaired.  6. The right atrium is moderately enlarged.   7. The aortic valve appears structurally normal and trileaflet.  No aortic stenosis or regurgitation.   8. The mitral valve is normal.  Moderate mitral regurgitation.   9. Tricuspid valve appears structurally normal.  Mild to moderate tricuspid regurgitation present.   10. There is severe pulmonary hypertension.  The right ventricular systolic pressure as measured by Doppler is 63.08 mmHg.   11. There is no pericardial effusion.   12. The aortic root, ascending  aorta and aortic arch are normal. (Previous EF 20-25%, RVSP 52 on 09/18/15 and EF 35-40%, RVSP 86.43 on 08/06/16.)  Nuclear stress test 08/10/16 (Care Everywhere):  Result Impression  1. No significant reversible ischemia or  infarction. 2. Mild global hypokinesis. 3. Left ventricular ejection fraction 40% 4. Non invasive risk stratification*: Intermediate   CTA Chest 05/11/17: IMPRESSION: 1. No pulmonary emboli. 2. Aortic Atherosclerosis (ICD10-I70.0) and Emphysema (ICD10-J43.9). 3. Chronic interstitial disease with peribronchial thickening which is probably chronic as well.  CT Chest 07/02/16 [facility]): Impression: 2 subcentimeter pleural-based nodules on the right.  Emphysema with interstitial lung disease.  - Spirometry 01/12/17 [facility]): FEV1 1.0 (36%), FEV6 2.0 (58%), FEV1/FEV6 49% (62%). Interpretation: No interpretation. Poor test qualitly. Caution only one acceptable maneuver - Interpret with care.  - Spirometry 07/08/16 [facility]): Findings: FVC 2.91 (115%), FEV1 1.24 (68%), FEV1/FVC 46% (47%), DLCO 6.5 (43%). Spirometry shows a reduction in FEV1 that is 68% of predicted.  The ratio of FEV1 over forced vital capacity was 46%.  Lung volumes are normal.  Diffusion capacity is reduced.  There is no correction with alveolar ventilation.  Flow volume loop show evidence of obstruction.  Impression: Severe obstructive defect is noted.  Recommend clinical correlation.  CXR 05/11/17: IMPRESSION: 1. No acute abnormality. 2. Unchanged hyperinflation and emphysema. Unchanged mild Cardiomegaly.  Abdominal aortogram with LE runoff 04/01/17:  Heavily calcified aortoiliac segments  Distal aorta tapers  Occluded right iliac arterial system with occluded stent  Multiple sub-total occlusions in left iliac arterial system  Right leg perfusion via hypertrophied lumbar vs sacral artery  Sub-optimal imaging of bilateral leg arteries due to underfilling  Right common femoral artery and portions of profunda not visualized  Bilateral superficial femoral arteries occluded  Distal reconstitution of above-the-knee popliteal arteries  Proximal tibial arteries in both legs patent with underfilling limiting imaging of  distal  Preoperative labs noted.   Hospitalized earlier this month for COPD exacerbation, no pneumonia. He denied SOB, fever, cough, and chest pain at PAT. His last cardiology and pulmonology visits were within the past three months. EF remains mildly decreased but improved since 2017. He had no significant ischemia on 2018 stress test. He has known pulmonary hypertension but able to perform ADLs and housework. He tolerated a lower risk procedure in February 2019. I reviewed above with anesthesiologist Dr. Lewie Loron. If patient without acute cardiopulmonary symptoms or other new changes then it is anticipated that he can proceed as planned. Will update Dr. Imogene Burn and update his on history of severe pulmonary hypertension and heavy alcohol use since I'm not sure this was reported to him by the patient.   Velna [facility]/Anesthesiology Phone [phone] 05/27/2017 5:23 PM

## 2017-05-27 ENCOUNTER — Encounter (HOSPITAL_COMMUNITY): Payer: Self-pay

## 2017-05-31 NOTE — Anesthesia Preprocedure Evaluation (Addendum)
Anesthesia Evaluation  Patient identified by MRN, date of birth, ID band Patient awake    Reviewed: Allergy & Precautions, NPO status , Patient's Chart, lab work & pertinent test results  Airway Mallampati: II  TM Distance: >3 FB Neck ROM: Full    Dental  (+) Edentulous Upper, Edentulous Lower, Dental Advisory Given   Pulmonary shortness of breath, with exertion and at rest, COPD, Current Smoker,  Spirometry 01/12/17 [facility]): FEV1 1.0 (36%), FEV6 2.0 (58%), FEV1/FEV6 49% (62%). Interpretation    (-) decreased breath sounds      Cardiovascular hypertension, + Past MI, + Peripheral Vascular Disease and +CHF  Normal cardiovascular exam  02/05/17- mild to moderately impaired with an EF 40-45%.  Mod MR& TR  There is severe pulmonary hypertension.  The right ventricular systolic pressure as measured by Doppler is 63.08 mmHg   Neuro/Psych    GI/Hepatic negative GI ROS, Neg liver ROS, GERD  ,  Endo/Other    Renal/GU      Musculoskeletal  (+) Arthritis ,   Abdominal   Peds  Hematology  (+) anemia ,   Anesthesia Other Findings   Reproductive/Obstetrics                        Lab Results  Component Value Date   WBC 8.9 05/25/2017   HGB 17.4 (H) 05/25/2017   HCT 50.4 05/25/2017   MCV 101.0 (H) 05/25/2017   PLT 187 05/25/2017   Lab Results  Component Value Date   CREATININE 0.79 05/25/2017   BUN 5 (L) 05/25/2017   NA 135 05/25/2017   K 3.9 05/25/2017   CL 100 (L) 05/25/2017   CO2 24 05/25/2017    Anesthesia Physical Anesthesia Plan  ASA: IV  Anesthesia Plan: General   Post-op Pain Management:    Induction: Intravenous  PONV Risk Score and Plan: Treatment may vary due to age or medical condition and Ondansetron  Airway Management Planned: Oral ETT  Additional Equipment: Arterial line, PA Cath and CVP  Intra-op Plan:   Post-operative Plan: Possible Post-op  intubation/ventilation  Informed Consent: I have reviewed the patients History and Physical, chart, labs and discussed the procedure including the risks, benefits and alternatives for the proposed anesthesia with the patient or authorized representative who has indicated his/her understanding and acceptance.   Dental advisory given  Plan Discussed with: CRNA  Anesthesia Plan Comments:       Anesthesia Quick Evaluation

## 2017-06-01 ENCOUNTER — Encounter (HOSPITAL_COMMUNITY)
Admission: RE | Disposition: A | Discharge status: Home Health | Payer: Self-pay | Source: Ambulatory Visit | Attending: Vascular Surgery

## 2017-06-01 ENCOUNTER — Inpatient Hospital Stay (HOSPITAL_COMMUNITY): Payer: Medicaid Other | Admitting: Certified Registered Nurse Anesthetist

## 2017-06-01 ENCOUNTER — Encounter (HOSPITAL_COMMUNITY): Payer: Self-pay | Admitting: *Deleted

## 2017-06-01 ENCOUNTER — Inpatient Hospital Stay (HOSPITAL_COMMUNITY)
Admission: RE | Admit: 2017-06-01 | Discharge: 2017-06-07 | DRG: 270 | Disposition: A | Discharge status: Home Health | Payer: Medicaid Other | Source: Ambulatory Visit | Attending: Vascular Surgery | Admitting: Vascular Surgery

## 2017-06-01 ENCOUNTER — Inpatient Hospital Stay (HOSPITAL_COMMUNITY): Payer: Medicaid Other

## 2017-06-01 ENCOUNTER — Inpatient Hospital Stay (HOSPITAL_COMMUNITY): Payer: Medicaid Other | Admitting: Vascular Surgery

## 2017-06-01 DIAGNOSIS — I973 Postprocedural hypertension: Secondary | ICD-10-CM | POA: Diagnosis not present

## 2017-06-01 DIAGNOSIS — J9811 Atelectasis: Secondary | ICD-10-CM | POA: Diagnosis present

## 2017-06-01 DIAGNOSIS — I252 Old myocardial infarction: Secondary | ICD-10-CM

## 2017-06-01 DIAGNOSIS — Z4659 Encounter for fitting and adjustment of other gastrointestinal appliance and device: Secondary | ICD-10-CM

## 2017-06-01 DIAGNOSIS — I5043 Acute on chronic combined systolic (congestive) and diastolic (congestive) heart failure: Secondary | ICD-10-CM | POA: Diagnosis present

## 2017-06-01 DIAGNOSIS — I745 Embolism and thrombosis of iliac artery: Secondary | ICD-10-CM | POA: Diagnosis present

## 2017-06-01 DIAGNOSIS — I2721 Secondary pulmonary arterial hypertension: Secondary | ICD-10-CM | POA: Diagnosis not present

## 2017-06-01 DIAGNOSIS — I998 Other disorder of circulatory system: Secondary | ICD-10-CM | POA: Diagnosis present

## 2017-06-01 DIAGNOSIS — Z833 Family history of diabetes mellitus: Secondary | ICD-10-CM | POA: Diagnosis not present

## 2017-06-01 DIAGNOSIS — Z681 Body mass index (BMI) 19 or less, adult: Secondary | ICD-10-CM | POA: Diagnosis not present

## 2017-06-01 DIAGNOSIS — E785 Hyperlipidemia, unspecified: Secondary | ICD-10-CM | POA: Diagnosis present

## 2017-06-01 DIAGNOSIS — M1990 Unspecified osteoarthritis, unspecified site: Secondary | ICD-10-CM | POA: Diagnosis present

## 2017-06-01 DIAGNOSIS — J9601 Acute respiratory failure with hypoxia: Secondary | ICD-10-CM | POA: Diagnosis not present

## 2017-06-01 DIAGNOSIS — I509 Heart failure, unspecified: Secondary | ICD-10-CM | POA: Diagnosis not present

## 2017-06-01 DIAGNOSIS — J441 Chronic obstructive pulmonary disease with (acute) exacerbation: Secondary | ICD-10-CM | POA: Diagnosis present

## 2017-06-01 DIAGNOSIS — Z79899 Other long term (current) drug therapy: Secondary | ICD-10-CM

## 2017-06-01 DIAGNOSIS — F1010 Alcohol abuse, uncomplicated: Secondary | ICD-10-CM | POA: Diagnosis present

## 2017-06-01 DIAGNOSIS — R188 Other ascites: Secondary | ICD-10-CM | POA: Diagnosis present

## 2017-06-01 DIAGNOSIS — I110 Hypertensive heart disease with heart failure: Secondary | ICD-10-CM | POA: Diagnosis present

## 2017-06-01 DIAGNOSIS — Z9119 Patient's noncompliance with other medical treatment and regimen: Secondary | ICD-10-CM

## 2017-06-01 DIAGNOSIS — J9621 Acute and chronic respiratory failure with hypoxia: Secondary | ICD-10-CM | POA: Diagnosis not present

## 2017-06-01 DIAGNOSIS — Z880 Allergy status to penicillin: Secondary | ICD-10-CM

## 2017-06-01 DIAGNOSIS — R7303 Prediabetes: Secondary | ICD-10-CM | POA: Diagnosis present

## 2017-06-01 DIAGNOSIS — I70209 Unspecified atherosclerosis of native arteries of extremities, unspecified extremity: Principal | ICD-10-CM | POA: Diagnosis present

## 2017-06-01 DIAGNOSIS — E871 Hypo-osmolality and hyponatremia: Secondary | ICD-10-CM | POA: Diagnosis not present

## 2017-06-01 DIAGNOSIS — I7410 Embolism and thrombosis of unspecified parts of aorta: Secondary | ICD-10-CM

## 2017-06-01 DIAGNOSIS — E440 Moderate protein-calorie malnutrition: Secondary | ICD-10-CM | POA: Diagnosis present

## 2017-06-01 DIAGNOSIS — K7460 Unspecified cirrhosis of liver: Secondary | ICD-10-CM | POA: Diagnosis present

## 2017-06-01 DIAGNOSIS — K219 Gastro-esophageal reflux disease without esophagitis: Secondary | ICD-10-CM | POA: Diagnosis present

## 2017-06-01 DIAGNOSIS — J9690 Respiratory failure, unspecified, unspecified whether with hypoxia or hypercapnia: Secondary | ICD-10-CM

## 2017-06-01 DIAGNOSIS — E7800 Pure hypercholesterolemia, unspecified: Secondary | ICD-10-CM | POA: Diagnosis present

## 2017-06-01 DIAGNOSIS — I2729 Other secondary pulmonary hypertension: Secondary | ICD-10-CM | POA: Diagnosis present

## 2017-06-01 DIAGNOSIS — F17210 Nicotine dependence, cigarettes, uncomplicated: Secondary | ICD-10-CM | POA: Diagnosis present

## 2017-06-01 DIAGNOSIS — Z7982 Long term (current) use of aspirin: Secondary | ICD-10-CM

## 2017-06-01 DIAGNOSIS — Z8249 Family history of ischemic heart disease and other diseases of the circulatory system: Secondary | ICD-10-CM

## 2017-06-01 DIAGNOSIS — M79609 Pain in unspecified limb: Secondary | ICD-10-CM | POA: Diagnosis not present

## 2017-06-01 DIAGNOSIS — Z9889 Other specified postprocedural states: Secondary | ICD-10-CM

## 2017-06-01 DIAGNOSIS — I700 Atherosclerosis of aorta: Secondary | ICD-10-CM

## 2017-06-01 DIAGNOSIS — I70223 Atherosclerosis of native arteries of extremities with rest pain, bilateral legs: Secondary | ICD-10-CM

## 2017-06-01 HISTORY — DX: Nonrheumatic mitral (valve) insufficiency: I34.0

## 2017-06-01 HISTORY — DX: Unspecified protein-calorie malnutrition: E46

## 2017-06-01 HISTORY — DX: Other specified symptoms and signs involving the circulatory and respiratory systems: R09.89

## 2017-06-01 HISTORY — DX: Rheumatic tricuspid insufficiency: I07.1

## 2017-06-01 HISTORY — PX: AORTA - BILATERAL FEMORAL ARTERY BYPASS GRAFT: SHX1175

## 2017-06-01 HISTORY — DX: Chronic systolic (congestive) heart failure: I50.22

## 2017-06-01 HISTORY — DX: Alcohol abuse, uncomplicated: F10.10

## 2017-06-01 LAB — BASIC METABOLIC PANEL
Anion gap: 11 (ref 5–15)
CALCIUM: 8 mg/dL — AB (ref 8.9–10.3)
CO2: 18 mmol/L — ABNORMAL LOW (ref 22–32)
CREATININE: 0.75 mg/dL (ref 0.61–1.24)
Chloride: 98 mmol/L — ABNORMAL LOW (ref 101–111)
GFR calc Af Amer: >60 mL/min (ref >60)
GFR calc non Af Amer: >60 mL/min (ref >60)
GLUCOSE: 140 mg/dL — AB (ref 65–99)
POTASSIUM: 4.4 mmol/L (ref 3.5–5.1)
Sodium: 127 mmol/L — ABNORMAL LOW (ref 135–145)

## 2017-06-01 LAB — CBC
HCT: 40.7 % (ref 39.0–52.0)
Hemoglobin: 13.9 g/dL (ref 13.0–17.0)
MCH: 34.2 pg — AB (ref 26.0–34.0)
MCHC: 34.2 g/dL (ref 30.0–36.0)
MCV: 100 fL (ref 78.0–100.0)
PLATELETS: 195 10*3/uL (ref 150–400)
RBC: 4.07 MIL/uL — ABNORMAL LOW (ref 4.22–5.81)
RDW: 14.1 % (ref 11.5–15.5)
WBC: 14.8 10*3/uL — ABNORMAL HIGH (ref 4.0–10.5)

## 2017-06-01 LAB — ALBUMIN: Albumin: 3 g/dL — ABNORMAL LOW (ref 3.5–5.0)

## 2017-06-01 LAB — HEPATIC FUNCTION PANEL
ALT: 13 U/L — AB (ref 17–63)
AST: 23 U/L (ref 15–41)
Albumin: 3.1 g/dL — ABNORMAL LOW (ref 3.5–5.0)
Alkaline Phosphatase: 106 U/L (ref 38–126)
BILIRUBIN DIRECT: 1.4 mg/dL — AB (ref 0.1–0.5)
BILIRUBIN INDIRECT: 1.2 mg/dL — AB (ref 0.3–0.9)
BILIRUBIN TOTAL: 2.6 mg/dL — AB (ref 0.3–1.2)
Total Protein: 6.1 g/dL — ABNORMAL LOW (ref 6.5–8.1)

## 2017-06-01 LAB — COOXEMETRY PANEL
Carboxyhemoglobin: 3.1 % — ABNORMAL HIGH (ref 0.5–1.5)
Methemoglobin: 0.8 % (ref 0.0–1.5)
O2 Saturation: 64.5 %
Total hemoglobin: 13.6 g/dL (ref 12.0–16.0)

## 2017-06-01 LAB — PROTIME-INR
INR: 1.19
Prothrombin Time: 15 seconds (ref 11.4–15.2)

## 2017-06-01 LAB — C-REACTIVE PROTEIN: CRP: 12.7 mg/dL — ABNORMAL HIGH (ref <1.0)

## 2017-06-01 LAB — PREPARE RBC (CROSSMATCH)

## 2017-06-01 LAB — APTT: aPTT: 32 seconds (ref 24–36)

## 2017-06-01 LAB — MAGNESIUM: MAGNESIUM: 1.8 mg/dL (ref 1.7–2.4)

## 2017-06-01 SURGERY — CREATION, BYPASS, ARTERIAL, AORTA TO FEMORAL, BILATERAL, USING GRAFT
Anesthesia: General | Site: Abdomen

## 2017-06-01 MED ORDER — MIDAZOLAM HCL 5 MG/5ML IJ SOLN
INTRAMUSCULAR | Status: DC | PRN
  Administered 2017-06-01 (×2): 1 mg via INTRAVENOUS

## 2017-06-01 MED ORDER — FUROSEMIDE 10 MG/ML IJ SOLN
20.0000 mg | Freq: Once | INTRAMUSCULAR | Status: AC
  Administered 2017-06-01: 20 mg via INTRAVENOUS
  Filled 2017-06-01: qty 2

## 2017-06-01 MED ORDER — HYDROMORPHONE HCL 1 MG/ML IJ SOLN
INTRAMUSCULAR | Status: AC
  Filled 2017-06-01: qty 1

## 2017-06-01 MED ORDER — ONDANSETRON HCL 4 MG/2ML IJ SOLN
INTRAMUSCULAR | Status: DC | PRN
  Administered 2017-06-01: 4 mg via INTRAVENOUS

## 2017-06-01 MED ORDER — ESMOLOL HCL 100 MG/10ML IV SOLN
INTRAVENOUS | Status: AC
  Filled 2017-06-01: qty 10

## 2017-06-01 MED ORDER — MAGNESIUM SULFATE 2 GM/50ML IV SOLN
2.0000 g | Freq: Every day | INTRAVENOUS | Status: DC | PRN
  Filled 2017-06-01: qty 50

## 2017-06-01 MED ORDER — ONDANSETRON HCL 4 MG/2ML IJ SOLN: 4.0000 mg | Freq: Four times a day (QID) | INTRAMUSCULAR | Status: DC | PRN

## 2017-06-01 MED ORDER — HEPARIN SODIUM (PORCINE) 1000 UNIT/ML IJ SOLN
INTRAMUSCULAR | Status: DC | PRN
  Administered 2017-06-01: 5000 [IU] via INTRAVENOUS
  Administered 2017-06-01: 2000 [IU] via INTRAVENOUS

## 2017-06-01 MED ORDER — DOCUSATE SODIUM 100 MG PO CAPS
100.0000 mg | ORAL_CAPSULE | Freq: Every day | ORAL | Status: DC
  Administered 2017-06-04 – 2017-06-07 (×4): 100 mg via ORAL
  Filled 2017-06-01 (×4): qty 1

## 2017-06-01 MED ORDER — SODIUM CHLORIDE 0.9 % IV SOLN: Freq: Once | INTRAVENOUS | Status: DC

## 2017-06-01 MED ORDER — MANNITOL 25 % IV SOLN
INTRAVENOUS | Status: DC | PRN
  Administered 2017-06-01: 25 g via INTRAVENOUS

## 2017-06-01 MED ORDER — GUAIFENESIN-DM 100-10 MG/5ML PO SYRP
15.0000 mL | ORAL_SOLUTION | ORAL | Status: DC | PRN
  Administered 2017-06-04 – 2017-06-06 (×3): 15 mL via ORAL
  Filled 2017-06-01 (×3): qty 15

## 2017-06-01 MED ORDER — MILRINONE LACTATE IN DEXTROSE 20-5 MG/100ML-% IV SOLN
0.1250 ug/kg/min | INTRAVENOUS | Status: DC
  Administered 2017-06-01: .2 ug/kg/min via INTRAVENOUS
  Filled 2017-06-01: qty 100

## 2017-06-01 MED ORDER — POTASSIUM CHLORIDE CRYS ER 20 MEQ PO TBCR
20.0000 meq | EXTENDED_RELEASE_TABLET | Freq: Every day | ORAL | Status: DC | PRN
  Filled 2017-06-01: qty 2

## 2017-06-01 MED ORDER — CHLORHEXIDINE GLUCONATE 4 % EX LIQD: 60.0000 mL | Freq: Once | CUTANEOUS | Status: DC

## 2017-06-01 MED ORDER — PROPOFOL 10 MG/ML IV BOLUS
INTRAVENOUS | Status: AC
  Filled 2017-06-01: qty 20

## 2017-06-01 MED ORDER — SUGAMMADEX SODIUM 200 MG/2ML IV SOLN
INTRAVENOUS | Status: AC
  Filled 2017-06-01: qty 2

## 2017-06-01 MED ORDER — LORAZEPAM 2 MG/ML IJ SOLN
2.0000 mg | INTRAMUSCULAR | Status: DC | PRN
  Administered 2017-06-01: 2 mg via INTRAVENOUS
  Filled 2017-06-01 (×4): qty 1

## 2017-06-01 MED ORDER — PROMETHAZINE HCL 25 MG/ML IJ SOLN: 6.2500 mg | INTRAMUSCULAR | Status: DC | PRN

## 2017-06-01 MED ORDER — SUGAMMADEX SODIUM 200 MG/2ML IV SOLN
INTRAVENOUS | Status: DC | PRN
  Administered 2017-06-01: 100 mg via INTRAVENOUS

## 2017-06-01 MED ORDER — FENTANYL CITRATE (PF) 250 MCG/5ML IJ SOLN
INTRAMUSCULAR | Status: AC
  Filled 2017-06-01: qty 5

## 2017-06-01 MED ORDER — FENTANYL CITRATE (PF) 250 MCG/5ML IJ SOLN
INTRAMUSCULAR | Status: DC | PRN
  Administered 2017-06-01: 100 ug via INTRAVENOUS
  Administered 2017-06-01: 75 ug via INTRAVENOUS
  Administered 2017-06-01 (×2): 50 ug via INTRAVENOUS
  Administered 2017-06-01: 25 ug via INTRAVENOUS
  Administered 2017-06-01: 100 ug via INTRAVENOUS
  Administered 2017-06-01 (×4): 50 ug via INTRAVENOUS

## 2017-06-01 MED ORDER — LACTATED RINGERS IV SOLN
INTRAVENOUS | Status: DC | PRN
  Administered 2017-06-01 (×2): via INTRAVENOUS

## 2017-06-01 MED ORDER — ROCURONIUM BROMIDE 10 MG/ML (PF) SYRINGE
PREFILLED_SYRINGE | INTRAVENOUS | Status: AC
  Filled 2017-06-01: qty 5

## 2017-06-01 MED ORDER — ARTIFICIAL TEARS OPHTHALMIC OINT
TOPICAL_OINTMENT | OPHTHALMIC | Status: AC
  Filled 2017-06-01: qty 3.5

## 2017-06-01 MED ORDER — PROTAMINE SULFATE 10 MG/ML IV SOLN
INTRAVENOUS | Status: AC
  Filled 2017-06-01: qty 5

## 2017-06-01 MED ORDER — ONDANSETRON HCL 4 MG/2ML IJ SOLN
INTRAMUSCULAR | Status: AC
  Filled 2017-06-01: qty 2

## 2017-06-01 MED ORDER — HEMOSTATIC AGENTS (NO CHARGE) OPTIME
TOPICAL | Status: DC | PRN
  Administered 2017-06-01: 1 via TOPICAL

## 2017-06-01 MED ORDER — LIDOCAINE 2% (20 MG/ML) 5 ML SYRINGE
INTRAMUSCULAR | Status: DC | PRN
  Administered 2017-06-01: 80 mg via INTRAVENOUS

## 2017-06-01 MED ORDER — HYDROCODONE-ACETAMINOPHEN 7.5-325 MG PO TABS: 1.0000 | ORAL_TABLET | Freq: Once | ORAL | Status: DC | PRN

## 2017-06-01 MED ORDER — SODIUM CHLORIDE 0.9 % IV SOLN
INTRAVENOUS | Status: AC
  Filled 2017-06-01: qty 1.2

## 2017-06-01 MED ORDER — PROTAMINE SULFATE 10 MG/ML IV SOLN
INTRAVENOUS | Status: DC | PRN
  Administered 2017-06-01: 40 mg via INTRAVENOUS

## 2017-06-01 MED ORDER — VANCOMYCIN HCL IN DEXTROSE 1-5 GM/200ML-% IV SOLN
1000.0000 mg | Freq: Two times a day (BID) | INTRAVENOUS | Status: AC
  Administered 2017-06-01 – 2017-06-02 (×2): 1000 mg via INTRAVENOUS
  Filled 2017-06-01 (×2): qty 200

## 2017-06-01 MED ORDER — MIDAZOLAM HCL 2 MG/2ML IJ SOLN
INTRAMUSCULAR | Status: AC
  Filled 2017-06-01: qty 2

## 2017-06-01 MED ORDER — KCL IN DEXTROSE-NACL 20-5-0.45 MEQ/L-%-% IV SOLN
INTRAVENOUS | Status: DC
  Administered 2017-06-01 – 2017-06-02 (×2): via INTRAVENOUS
  Filled 2017-06-01 (×3): qty 1000

## 2017-06-01 MED ORDER — HYDROMORPHONE HCL 1 MG/ML IJ SOLN
0.2500 mg | INTRAMUSCULAR | Status: DC | PRN
  Administered 2017-06-01 (×4): 0.5 mg via INTRAVENOUS

## 2017-06-01 MED ORDER — THIAMINE HCL 100 MG/ML IJ SOLN
100.0000 mg | Freq: Every day | INTRAMUSCULAR | Status: DC
  Administered 2017-06-01 – 2017-06-03 (×3): 100 mg via INTRAVENOUS
  Filled 2017-06-01 (×3): qty 2

## 2017-06-01 MED ORDER — ETOMIDATE 2 MG/ML IV SOLN
INTRAVENOUS | Status: AC
  Filled 2017-06-01: qty 10

## 2017-06-01 MED ORDER — LABETALOL HCL 5 MG/ML IV SOLN
10.0000 mg | INTRAVENOUS | Status: AC | PRN
  Administered 2017-06-01 (×5): 10 mg via INTRAVENOUS
  Filled 2017-06-01 (×3): qty 4

## 2017-06-01 MED ORDER — SODIUM CHLORIDE 0.9 % IV SOLN: 500.0000 mL | Freq: Once | INTRAVENOUS | Status: DC | PRN

## 2017-06-01 MED ORDER — 0.9 % SODIUM CHLORIDE (POUR BTL) OPTIME
TOPICAL | Status: DC | PRN
  Administered 2017-06-01: 3000 mL

## 2017-06-01 MED ORDER — ALUM & MAG HYDROXIDE-SIMETH 200-200-20 MG/5ML PO SUSP: 15.0000 mL | ORAL | Status: DC | PRN

## 2017-06-01 MED ORDER — FOLIC ACID 5 MG/ML IJ SOLN
1.0000 mg | Freq: Every day | INTRAMUSCULAR | Status: DC
  Administered 2017-06-01 – 2017-06-03 (×3): 1 mg via INTRAVENOUS
  Filled 2017-06-01 (×5): qty 0.2

## 2017-06-01 MED ORDER — ESMOLOL HCL 100 MG/10ML IV SOLN
INTRAVENOUS | Status: DC | PRN
  Administered 2017-06-01 (×2): 10 mg via INTRAVENOUS
  Administered 2017-06-01: 20 mg via INTRAVENOUS

## 2017-06-01 MED ORDER — VANCOMYCIN HCL IN DEXTROSE 1-5 GM/200ML-% IV SOLN
1000.0000 mg | INTRAVENOUS | Status: AC
  Administered 2017-06-01: 1000 mg via INTRAVENOUS
  Filled 2017-06-01: qty 200

## 2017-06-01 MED ORDER — PANTOPRAZOLE SODIUM 40 MG PO TBEC: 40.0000 mg | DELAYED_RELEASE_TABLET | Freq: Every day | ORAL | Status: DC

## 2017-06-01 MED ORDER — LACTATED RINGERS IV SOLN
INTRAVENOUS | Status: DC | PRN
  Administered 2017-06-01: 07:00:00 via INTRAVENOUS

## 2017-06-01 MED ORDER — SODIUM CHLORIDE 0.9 % IV SOLN
INTRAVENOUS | Status: DC | PRN
  Administered 2017-06-01: 11:00:00 via INTRAVENOUS

## 2017-06-01 MED ORDER — PHENYLEPHRINE HCL 10 MG/ML IJ SOLN
INTRAVENOUS | Status: DC | PRN
  Administered 2017-06-01: 30 ug/min via INTRAVENOUS

## 2017-06-01 MED ORDER — HEPARIN SODIUM (PORCINE) 1000 UNIT/ML IJ SOLN
INTRAMUSCULAR | Status: AC
  Filled 2017-06-01: qty 1

## 2017-06-01 MED ORDER — LABETALOL HCL 5 MG/ML IV SOLN
INTRAVENOUS | Status: AC
  Administered 2017-06-01: 20 mg
  Filled 2017-06-01: qty 4

## 2017-06-01 MED ORDER — ROCURONIUM BROMIDE 10 MG/ML (PF) SYRINGE
PREFILLED_SYRINGE | INTRAVENOUS | Status: DC | PRN
  Administered 2017-06-01: 30 mg via INTRAVENOUS
  Administered 2017-06-01 (×2): 10 mg via INTRAVENOUS
  Administered 2017-06-01: 40 mg via INTRAVENOUS
  Administered 2017-06-01: 30 mg via INTRAVENOUS

## 2017-06-01 MED ORDER — SODIUM CHLORIDE 0.9 % IV SOLN: INTRAVENOUS | Status: DC

## 2017-06-01 MED ORDER — ORAL CARE MOUTH RINSE
15.0000 mL | Freq: Two times a day (BID) | OROMUCOSAL | Status: DC
  Administered 2017-06-01 – 2017-06-04 (×4): 15 mL via OROMUCOSAL

## 2017-06-01 MED ORDER — DEXAMETHASONE SODIUM PHOSPHATE 10 MG/ML IJ SOLN
INTRAMUSCULAR | Status: DC | PRN
  Administered 2017-06-01: 10 mg via INTRAVENOUS

## 2017-06-01 MED ORDER — METOPROLOL TARTRATE 5 MG/5ML IV SOLN
2.0000 mg | INTRAVENOUS | Status: DC | PRN
  Filled 2017-06-01: qty 5

## 2017-06-01 MED ORDER — ACETAMINOPHEN 10 MG/ML IV SOLN: 1000.0000 mg | Freq: Once | INTRAVENOUS | Status: DC | PRN

## 2017-06-01 MED ORDER — ARTIFICIAL TEARS OPHTHALMIC OINT
TOPICAL_OINTMENT | OPHTHALMIC | Status: DC | PRN
  Administered 2017-06-01: 1 via OPHTHALMIC

## 2017-06-01 MED ORDER — PHENOL 1.4 % MT LIQD
1.0000 | OROMUCOSAL | Status: DC | PRN
  Administered 2017-06-03 – 2017-06-06 (×2): 1 via OROMUCOSAL
  Filled 2017-06-01: qty 177

## 2017-06-01 MED ORDER — BISACODYL 10 MG RE SUPP
10.0000 mg | Freq: Every day | RECTAL | Status: DC | PRN
  Filled 2017-06-01: qty 1

## 2017-06-01 MED ORDER — HYDRALAZINE HCL 20 MG/ML IJ SOLN
5.0000 mg | INTRAMUSCULAR | Status: DC | PRN
  Administered 2017-06-01: 5 mg via INTRAVENOUS
  Filled 2017-06-01: qty 1

## 2017-06-01 MED ORDER — MEPERIDINE HCL 50 MG/ML IJ SOLN: 6.2500 mg | INTRAMUSCULAR | Status: DC | PRN

## 2017-06-01 MED ORDER — ALBUMIN HUMAN 5 % IV SOLN
INTRAVENOUS | Status: DC | PRN
  Administered 2017-06-01 (×2): via INTRAVENOUS

## 2017-06-01 MED ORDER — DEXAMETHASONE SODIUM PHOSPHATE 10 MG/ML IJ SOLN
INTRAMUSCULAR | Status: AC
  Filled 2017-06-01: qty 1

## 2017-06-01 MED ORDER — ENOXAPARIN SODIUM 40 MG/0.4ML ~~LOC~~ SOLN
40.0000 mg | SUBCUTANEOUS | Status: DC
  Administered 2017-06-02 – 2017-06-07 (×6): 40 mg via SUBCUTANEOUS
  Filled 2017-06-01 (×6): qty 0.4

## 2017-06-01 MED ORDER — LIDOCAINE HCL (CARDIAC) 20 MG/ML IV SOLN
INTRAVENOUS | Status: AC
  Filled 2017-06-01: qty 5

## 2017-06-01 MED ORDER — ACETAMINOPHEN 325 MG RE SUPP: 325.0000 mg | RECTAL | Status: DC | PRN

## 2017-06-01 MED ORDER — SODIUM CHLORIDE 0.9 % IV SOLN
INTRAVENOUS | Status: DC | PRN
  Administered 2017-06-01: 500 mL

## 2017-06-01 MED ORDER — ETOMIDATE 2 MG/ML IV SOLN
INTRAVENOUS | Status: DC | PRN
  Administered 2017-06-01: 12 mg via INTRAVENOUS

## 2017-06-01 MED ORDER — HYDROMORPHONE HCL 1 MG/ML IJ SOLN
0.5000 mg | INTRAMUSCULAR | Status: DC | PRN
  Administered 2017-06-02 (×3): 1 mg via INTRAVENOUS
  Administered 2017-06-02: 0.5 mg via INTRAVENOUS
  Administered 2017-06-03 – 2017-06-04 (×4): 1 mg via INTRAVENOUS
  Administered 2017-06-04 (×2): 0.5 mg via INTRAVENOUS
  Administered 2017-06-04 – 2017-06-06 (×3): 1 mg via INTRAVENOUS
  Filled 2017-06-01 (×13): qty 1

## 2017-06-01 MED ORDER — ACETAMINOPHEN 325 MG PO TABS
325.0000 mg | ORAL_TABLET | ORAL | Status: DC | PRN
  Administered 2017-06-05 – 2017-06-07 (×9): 650 mg via ORAL
  Filled 2017-06-01 (×10): qty 2

## 2017-06-01 SURGICAL SUPPLY — 70 items
CANISTER SUCT 3000ML PPV (MISCELLANEOUS) ×3 IMPLANT
CATH EMB 3FR 80CM (CATHETERS) ×3 IMPLANT
CLIP VESOCCLUDE MED 24/CT (CLIP) ×3 IMPLANT
CLIP VESOCCLUDE SM WIDE 24/CT (CLIP) ×3 IMPLANT
COVER PROBE W GEL 5X96 (DRAPES) ×3 IMPLANT
DERMABOND ADVANCED (GAUZE/BANDAGES/DRESSINGS) ×4
DERMABOND ADVANCED .7 DNX12 (GAUZE/BANDAGES/DRESSINGS) ×2 IMPLANT
DRSG COVADERM 4X14 (GAUZE/BANDAGES/DRESSINGS) ×3 IMPLANT
ELECT BLADE 4.0 EZ CLEAN MEGAD (MISCELLANEOUS) ×3
ELECT BLADE 6.5 EXT (BLADE) ×3 IMPLANT
ELECT CAUTERY BLADE 6.4 (BLADE) ×3 IMPLANT
ELECT REM PT RETURN 9FT ADLT (ELECTROSURGICAL) ×6
ELECTRODE BLDE 4.0 EZ CLN MEGD (MISCELLANEOUS) ×1 IMPLANT
ELECTRODE REM PT RTRN 9FT ADLT (ELECTROSURGICAL) ×2 IMPLANT
FELT TEFLON 1X6 (MISCELLANEOUS) ×3 IMPLANT
GLOVE BIO SURGEON STRL SZ 6.5 (GLOVE) ×2 IMPLANT
GLOVE BIO SURGEON STRL SZ7 (GLOVE) ×3 IMPLANT
GLOVE BIO SURGEON STRL SZ7.5 (GLOVE) ×6 IMPLANT
GLOVE BIO SURGEONS STRL SZ 6.5 (GLOVE) ×1
GLOVE BIOGEL PI IND STRL 6.5 (GLOVE) ×3 IMPLANT
GLOVE BIOGEL PI IND STRL 7.5 (GLOVE) ×5 IMPLANT
GLOVE BIOGEL PI IND STRL 8 (GLOVE) ×1 IMPLANT
GLOVE BIOGEL PI INDICATOR 6.5 (GLOVE) ×6
GLOVE BIOGEL PI INDICATOR 7.5 (GLOVE) ×10
GLOVE BIOGEL PI INDICATOR 8 (GLOVE) ×2
GLOVE ECLIPSE 7.0 STRL STRAW (GLOVE) ×6 IMPLANT
GOWN STRL REUS W/ TWL LRG LVL3 (GOWN DISPOSABLE) ×7 IMPLANT
GOWN STRL REUS W/ TWL XL LVL3 (GOWN DISPOSABLE) ×1 IMPLANT
GOWN STRL REUS W/TWL LRG LVL3 (GOWN DISPOSABLE) ×14
GOWN STRL REUS W/TWL XL LVL3 (GOWN DISPOSABLE) ×2
GRAFT HEMASHIELD 14X8MM (Vascular Products) ×3 IMPLANT
HEMOSTAT SPONGE AVITENE ULTRA (HEMOSTASIS) ×3 IMPLANT
INSERT FOGARTY 61MM (MISCELLANEOUS) ×6 IMPLANT
INSERT FOGARTY SM (MISCELLANEOUS) ×9 IMPLANT
KIT BASIN OR (CUSTOM PROCEDURE TRAY) ×3 IMPLANT
KIT ROOM TURNOVER OR (KITS) ×3 IMPLANT
LOOP VESSEL MINI RED (MISCELLANEOUS) ×9 IMPLANT
NS IRRIG 1000ML POUR BTL (IV SOLUTION) ×6 IMPLANT
PACK AORTA (CUSTOM PROCEDURE TRAY) ×3 IMPLANT
PAD ARMBOARD 7.5X6 YLW CONV (MISCELLANEOUS) ×6 IMPLANT
PENCIL BUTTON HOLSTER BLD 10FT (ELECTRODE) ×3 IMPLANT
RETAINER VISCERA MED (MISCELLANEOUS) ×3 IMPLANT
STAPLER VISISTAT 35W (STAPLE) ×9 IMPLANT
SUT ETHIBOND 5 LR DA (SUTURE) ×6 IMPLANT
SUT MNCRL AB 4-0 PS2 18 (SUTURE) ×6 IMPLANT
SUT PDS AB 1 TP1 96 (SUTURE) ×6 IMPLANT
SUT PROLENE 3 0 SH1 36 (SUTURE) ×6 IMPLANT
SUT PROLENE 5 0 C 1 24 (SUTURE) ×9 IMPLANT
SUT PROLENE 5 0 C 1 36 (SUTURE) ×6 IMPLANT
SUT PROLENE 6 0 BV (SUTURE) ×6 IMPLANT
SUT PROLENE 7 0 BV 1 (SUTURE) ×6 IMPLANT
SUT PROLENE 7 0 BV1 MDA (SUTURE) ×6 IMPLANT
SUT SILK 2 0 (SUTURE) ×2
SUT SILK 2 0 SH CR/8 (SUTURE) ×3 IMPLANT
SUT SILK 2 0 TIES 17X18 (SUTURE) ×2
SUT SILK 2-0 18XBRD TIE 12 (SUTURE) ×1 IMPLANT
SUT SILK 2-0 18XBRD TIE BLK (SUTURE) ×1 IMPLANT
SUT SILK 3 0 (SUTURE) ×2
SUT SILK 3 0 TIES 17X18 (SUTURE) ×2
SUT SILK 3-0 18XBRD TIE 12 (SUTURE) ×1 IMPLANT
SUT SILK 3-0 18XBRD TIE BLK (SUTURE) ×1 IMPLANT
SUT VIC AB 2-0 CT1 27 (SUTURE) ×4
SUT VIC AB 2-0 CT1 TAPERPNT 27 (SUTURE) ×2 IMPLANT
SUT VIC AB 3-0 SH 27 (SUTURE) ×8
SUT VIC AB 3-0 SH 27X BRD (SUTURE) ×4 IMPLANT
SYRINGE 1CC SLIP TB (MISCELLANEOUS) ×3 IMPLANT
TOWEL BLUE STERILE X RAY DET (MISCELLANEOUS) ×6 IMPLANT
TOWEL GREEN STERILE (TOWEL DISPOSABLE) ×3 IMPLANT
TRAY FOLEY MTR SLVR 16FR STAT (CATHETERS) ×3 IMPLANT
WATER STERILE IRR 1000ML POUR (IV SOLUTION) ×6 IMPLANT

## 2017-06-01 NOTE — Progress Notes (Signed)
Pt CIWA=11 at 2000 assessment. Pt somnolent with periods of apnea and has recently aspirated. Will not give 2mg  ativan coverage in order to protect pt's airway and allow pt to become more alert to initiate coughing and deep breathing. Will continue to assess for withdrawal symptoms and administer Ativan according to RN judgment and pt needs.

## 2017-06-01 NOTE — Anesthesia Procedure Notes (Signed)
Procedure Name: Intubation Date/Time: 06/01/2017 7:47 AM Performed by: Harden Mo, CRNA Pre-anesthesia Checklist: Patient identified, Emergency Drugs available, Suction available and Patient being monitored Patient Re-evaluated:Patient Re-evaluated prior to induction Oxygen Delivery Method: Circle System Utilized Preoxygenation: Pre-oxygenation with 100% oxygen Induction Type: IV induction Ventilation: Mask ventilation without difficulty Laryngoscope Size: Mac and 3 Grade View: Grade II Tube type: Subglottic suction tube Tube size: 7.5 mm Number of attempts: 1 Airway Equipment and Method: Stylet and Oral airway Placement Confirmation: ETT inserted through vocal cords under direct vision,  positive ETCO2 and breath sounds checked- equal and bilateral Secured at: 22 cm Tube secured with: Tape Dental Injury: Teeth and Oropharynx as per pre-operative assessment

## 2017-06-01 NOTE — Op Note (Signed)
    NAME: [name]    MRN: [id number] DOB: [birth year]    DATE OF OPERATION: 06/01/2017  PREOP DIAGNOSIS:    Aortoiliac occlusive disease  POSTOP DIAGNOSIS:    Same  PROCEDURE:    1.  Exposure of left femoral artery 2.  Left femoral anastomosis of aortofemoral bypass graft  CO-SURGEON's: Di Kindle. Edilia Bo, MD, Leonides Sake, MD  ASSIST: Lianne Cure PA  ANESTHESIA: General  EBL: Per anesthesia record  INDICATIONS:    Wayne [name] is a 62 y.o. male who presents with progressive bilateral lower extremity ischemia.  He presents for aortofemoral bypass grafting.  FINDINGS:   Good Doppler signals both feet at the completion of the procedure  TECHNIQUE:   The abdomen and groins had been prepped and draped in usual sterile fashion.  On the left side, an incision was made in the left groin longitudinally over the common femoral artery.  The dissection was carried down to the artery which was dissected free circumferentially.  I controlled multiple small side branches.  I then controlled the deep femoral artery and superficial femoral artery.  There were 2 large deep femoral artery branches.  The retroperitoneal tunnel was started on the left.  After the aortofemoral bypass graft had been sewn proximally both limbs have been delivered to the respective groins.  On the left side a longitudinal arteriotomy was made in the common femoral artery extending onto the deep femoral artery slightly.  The graft was cut to the appropriate length, spatulated, and sewn end to side to the common femoral artery extending slightly onto the superficial femoral artery with 5-0 Prolene suture.  Prior to completing this anastomosis the artery was backbled and flushed appropriately and the anastomosis completed.  Flow was reestablished to the left leg which the patient tolerated from a hemodynamic standpoint.  Hemostasis was obtained in the wound.  The remainder of the dictation is as per Dr.  Imogene Burn.  Waverly Ferrari, MD, FACS [facility]  DATE OF DICTATION:   06/01/2017

## 2017-06-01 NOTE — Op Note (Signed)
OPERATIVE NOTE   PROCEDURE: 1. Aortobifemoral bypass 2. Right femoral endarterectomy  PRE-OPERATIVE DIAGNOSIS: Bilateral leg rest pain  POST-OPERATIVE DIAGNOSIS: same as above   CO-SURGEONS: Leonides Sake, MD; Cari Caraway, MD  ASSISTANT(S): Lianne Cure, [facility]   ANESTHESIA: general  ESTIMATED BLOOD LOSS: 550 cc  BLOOD: 1 unit BRAT  UOP: 450 cc  FINDING(S): 1.  Somewhat cirrhotic liver with small amount of ascites 2.  Broad calcification of aorta with a softer segment ~3 cm distal to renal arteries with some mural thrombus present 3.  Right distal common femoral artery with calcified plaque occupying >75% of lumen 4.  Occluded bilateral superficial femoral arteries 5.  Dopplerable bilateral peroneal and posterior tibial arteries bilaterally at end of case  SPECIMEN(S):  none  INDICATIONS:   Wayne [name] is a 62 y.o. male who presents with bilateral leg rest pain.  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  I felt that his symptoms would be better managed with an aortobifemoral bypass.  The patient is aware the risks of aortic surgery include but are not limited to: bleeding, need for transfusion, infection, death, stroke, paralysis, wound complications, bowel injuries, impotence, bowel ischemia, extended ventilation and future ventral hernias.  Overall, I cited a mortality rate of 5-10% and morbidity rate of 30%.  The patient was aware of the risks and elected to proceed with the procedure.  DESCRIPTION: After obtaining full informed written consent, the patient was brought back to the operating room and placed supine upon the operating table.  The patient received IV antibiotics prior to induction.  A procedure time out was completed and the correct surgical site was verified.  After obtaining adequate anesthesia, the patient was prepped and draped in the standard fashion for:  aortobifemoral bypass.  I turned my attention to the right groin.  Under Sonosite guidance, I marked the position of the right common femoral artery.  I made an incision over the common femoral artery and dissected out the artery from distal external iliac artery down to the femoral bifurcation.  I placed vessels around all the profunda femoral artery branches, superficial femoral artery and circumflex iliac branches.  I dissected out the vein overlying the distal external iliac artery and ligated it with two 2-0 silk ties and transected the vein.  I bluntly dissected out the external iliac artery in the retroperitoneum, starting the retroperitoneal tunnel.    Dr. Edilia Bo completed the left femoral exposure, see his notes for details.  At this point, I made a mid-line incision from sub-xiphoid down to suprapubic position.   I dissected down to the fascia with electrocautery.  I opened the fascia in the mid-line and has some difficulty getting through what was somewhat thicken peritoneum.  I was able to get into the peritoneum without injury to any intestines or viscera.  I open the rest of the peritoneum and fascia under direct visualization.  Immediately a large somewhat cirrhotic liver and some ascites was evident.  I placed lap pads against the abdominal side walls.  I placed a Balfour retractor and obtained lateral retraction.  I then placed the post for the Omni retractor system.  The rest of the Omni system was placed.  I eviscerated the transverse colon into a wet towel and then eviscerated the small intestines into a wet towel.  I gently retracted the intestine out of the surgical field.  The duodenum was stuck onto  the aorta, so I sharply released the duodenum and readjusted the retraction.  The aorta was easily palpable due to its heavy calcification.    I dissected out the left renal vein and retracted it out of the way.  I then dissected out the infrarenal aortic neck, identifying the right and  left renal arteries.  I then dissected out the proximal 8 cm of the intrarenal aorta circumferentially to facilitate the anastomosis.  I dissected out the distal aorta down to the aortic bifurcation.  In this process, I identified the Inferior mesenteric artery.  I dissected out the right common iliac artery which was heavily calcified.  I placed a 2-0 Ethibond around the common iliac artery.  Similarly I dissected out the left common iliac artery and placed a 2-0 Ethibond around the common iliac artery.  Dr. Edilia Bo dissected out the rest of the retroperitoneal tunnel on the right side, placing a curved ring dissector through the tunnel.  I passed an umbilical tape through the tunnel.  I repeated this same process on the left side, placing an umbilical tape in the retroperitoneal tunnel.  I gave the patient 25 g of Mannitol.  The patient was given 5000 units of Heparin intravenously, which was a therapeutic bolus. An additional 2000 units of Heparin was administered every hour after initial bolus to maintain anticoagulation.  In total, 7000 units of Heparin was administrated to achieve and maintain a therapeutic level of anticoagulation.  After waiting 3 minutes, I compressed the right common iliac artery with a Kelly clamp and then Dr. Edilia Bo tied off the right common iliac artery with the 2-0 Ethibond tie.  I tied off the left common iliac artery with the 2-0 Ethibond tie.   I clamped the aorta in the immediate infrarenal position.  I transected the aorta 1 cm distal to the soft spot in the aorta.  There some active bleeding, so I tied off the Inferior mesenteric artery with a 2-0 silk tie.  I then clamped the distal aorta with a Debakey aortic clamp.  I had to do a limited endarterectomy to remove some calcification to facilitate oversewing the distal aorta.  This was completed with a double layer of 5-0 Prolene.  There was no further bleeding from the distal aorta.  This clamp was removed.    I noted some  thrombus in the lumen of the residual 3-4 cm segment of infrarenal aorta.  I did a limited endarterectomy to remove this thrombus without compromise of the heavily calcified aortic wall.  Based on sizing measurements, I selected a 14 mm x 8 mm aortobifemoral graft.  I shortened the body of this graft.  I sewed the graft to the aorta in an end-to-end configuration with a running stitch of 3-0 Prolene.  I pulled up on the suture line prior to completion.  I completed the anastomosis in the usual fashion.  I clamped the legs of the graft distally and released the proximal clamp.  There was one bleeding point that I repaired with a 3-0 Prolene stitch.  I then allowed each limb to bleeding antegrade, flushing out any thrombus: none was noted.  I reclamped each limb separately and then washed out each limb.  The right umbilical tape was tied to the right aortobifemoral limb and then pulled through the retroperitoneal tunnel.  Similarly the left aortobifemoral limb was pulled through the left retroperitoneal tunnel.  At this point, Dr. Edilia Bo completed the left aortofemoral limb anastomosis to the left common femoral  artery, see his notes for details.  I turned my attention to the right common femoral artery.  I placed all circumflex branchs under tension and placed the superficial femoral artery and profunda femoral artery branches under tension.  I clamped the distal external iliac artery.  I made an arteriotomy in the mid-common femoral artery with a 11-blade and then extended this proximally and mostly distally down to the profunda femoral artery.  I tested the proximal inflow and essentially there was no external iliac artery blood flow.  I could not see either the superficial femoral artery or profunda femoral artery lumens due to a calcified plaque occupying >75% of the lumen distally.  Subsequently, I felt endarterectomy was going to be necessary.  I started the endarterectomy in the mid-segment with a  Cytogeneticist.  I then cracked the calcified plaque with a heavy scissor.  I carried the dissection in the distal external iliac artery.  This maneuver caused a 1/3 circumference tear in the common femoral artery.  I then carried the dissection distally to distal common femoral artery where the plaque feathered and became adherent just prior to the femoral bifurcation.  I removed intimal flaps until I felt no further endarterectomy was safe.  I repaired the common femoral artery tear with interrupted stitches of 7-0 Prolene.  The wall appeared to be adequately intact.  I cut the right aortobifemoral limb to to the dimensions of the arteriotomy, using it as a Dacron patch also.  I sewed the right aortofemoral limb to the common femoral artery with a running stitch of 5-0 Prolene in an end-to-side configuration.  Prior completion, I backbled the profunda femoral artery branches: no thrombus.  I also allowed the aortofemoral limb to bleeding antegrade.  There was no thrombus.  I completed this anastomosis in the usual fashion.  I released all vessel loops and clamps.  There was immediately a pulse in the profunda femoral artery branches.  I packed both groin with Avitene and gave 40 mg of Protamine.  At this point, I turned my attention to the abdomen.  There was no further active bleeding.  A few venous bleeding points were controlled with electrocautery.  I ran the bowel and no injuries or ischemic segments were noted.  I washed out the retroperitoneal and no active bleeding was present.  I repaired the retroperitoneum with a running stitch of 3-0 Vicryl.  I then replaced the bowel into anatomic position and placed the omentum into anatomic position.  The fascia was reapproximated with two running stitch of double strands 1 PDS, tying them together in the supraumbilical segment.  I placed a 3-0 Vicryl stitch around the end of the PDS suture ends to flatten the suture again his fascia, due to this patient's  limited anterior subcutaneous tissue.  The skin edges were reapproximated with staples.  At this point, I washed out both groins.  There was no bleeding in the left groin.  The right groin had one bleeding point the injured portion of the common femoral artery wall.  I repaired this with a 7-0 Prolene stitch.  There was no further bleeding after this repair.    I broke scrub and turned my attention distally to the feet.  I could doppler peroneal artery and posterior tibial artery signals bilaterals: right > left.  I rescrubbed and then turned my attention to the right groin.  The tissue layer immediately superficial to the graft was reapproximated with a double layer of 2-0 Vicryl.  The superficial subcutaneous layer was then reapproximated with 3-0 Vicryl.  The skin was reapproximated with a running subcuticular stitch of 4-0 Monocryl.  The skin was cleaned, dried, and reinforced with Dermabond.  The left groin was closed in a similar fashion.  The mid-line incision was cleaned, dried, and dressed with a Coverall dressing.   COMPLICATIONS: none  CONDITION: stable   Leonides Sake, MD, [facility] [place] Office: [phone] Pager: [phone]  06/01/2017, 12:39 PM

## 2017-06-01 NOTE — Anesthesia Postprocedure Evaluation (Signed)
Anesthesia Post Note  Patient: Wayne [name]  Procedure(s) Performed: AORTA BIFEMORAL BYPASS GRAFT with Graft and Right Femoral Endartarectomy (N/A Abdomen)     Patient location during evaluation: PACU Anesthesia Type: General Level of consciousness: awake and alert Pain management: pain level controlled Vital Signs Assessment: post-procedure vital signs reviewed and stable Respiratory status: spontaneous breathing, nonlabored ventilation, respiratory function stable and patient connected to nasal cannula oxygen Cardiovascular status: blood pressure returned to baseline and stable Postop Assessment: no apparent nausea or vomiting Anesthetic complications: no    Last Vitals:  Vitals:   06/01/17 1500 06/01/17 1515  BP: (!) 155/89 (!) 157/92  Pulse: 72 72  Resp: 10 18  Temp:    SpO2: 92% 95%    Last Pain:  Vitals:   06/01/17 1500  TempSrc:   PainSc: Asleep                 Trevor Iha

## 2017-06-01 NOTE — H&P (Signed)
Established Critical Limb Ischemia Patient   History of Present Illness   Wayne [name] is a 62 y.o. ([birth year]) male who presents with chief complaint: R>L rest pain.   Prior procedures included: Ao, BRo (04/01/17).  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  The patient was sent for cardiac clearance: cleared and CTA abd/pelvis to evaluate aorta for calcification and planning for ABF.  Pt's scheduled ABF was canceled due to pneumonia/bronchitis.  This patient was then rescheduled 3 weeks later.       Past Medical History:  Diagnosis Date  . Arthritis   . CHF (congestive heart failure) (HCC)   . COPD (chronic obstructive pulmonary disease) (HCC)   . GERD (gastroesophageal reflux disease)   . Hypercholesterolemia   . Hypertension   . Myocardial infarction (HCC)   . Peripheral vascular disease (HCC)   . Pre-diabetes          Past Surgical History:  Procedure Laterality Date  . ABDOMINAL AORTOGRAM W/LOWER EXTREMITY N/A 04/01/2017   Procedure: ABDOMINAL AORTOGRAM W/LOWER EXTREMITY;  Surgeon: Fransisco Hertz, MD;  Location: [facility] INVASIVE CV LAB;  Service: Cardiovascular;  Laterality: N/A;  Bilateral  . HEMORRHOIDECTOMY WITH HEMORRHOID BANDING    . MASS EXCISION Left 04/13/2017   Procedure: EXCISION EPIDERMAL INCLUSION CYST LEFT GROIN;  Surgeon: Fransisco Hertz, MD;  Location: [facility] OR;  Service: Vascular;  Laterality: Left;    Social History        Socioeconomic History  . Marital status: Married    Spouse name: Not on file  . Number of children: Not on file  . Years of education: Not on file  . Highest education level: Not on file  Social Needs  . Financial resource strain: Not on file  . Food insecurity - worry: Not on file  . Food insecurity - inability: Not on file  . Transportation needs - medical: Not on file  . Transportation needs - non-medical: Not on  file  Occupational History  . Not on file  Tobacco Use  . Smoking status: Current Every Day Smoker    Packs/day: 1.00    Types: Cigarettes  . Smokeless tobacco: Never Used  . Tobacco comment: 4-5 cigs a day  Substance and Sexual Activity  . Alcohol use: Yes    Comment: weekly  . Drug use: No  . Sexual activity: Not on file  Other Topics Concern  . Not on file  Social History Narrative  . Not on file         Family History  Problem Relation Age of Onset  . Hypertension Sister   . Hyperlipidemia Sister   . Diabetes Sister   . Peripheral Artery Disease Father           Current Outpatient Medications  Medication Sig Dispense Refill  . acetaminophen (TYLENOL) 500 MG tablet Take 2 tablets (1,000 mg total) by mouth every 6 (six) hours as needed. (Patient taking differently: Take 250 mg by mouth every 6 (six) hours as needed for moderate pain. Takes 0.5 tablet) 30 tablet 0  . albuterol (PROVENTIL HFA;VENTOLIN HFA) 108 (90 Base) MCG/ACT inhaler Inhale 2 puffs into the lungs every 6 (six) hours as needed for wheezing or shortness of breath.    Marland Kitchen aspirin EC 81 MG tablet Take 81 mg by mouth daily.    . budesonide-formoterol (SYMBICORT) 160-4.5 MCG/ACT inhaler Inhale 2 puffs into the lungs 2 (  two) times daily.    . Glycerin-Hypromellose-PEG 400 (HM DRY EYE RELIEF OP) Apply 1 drop to eye daily as needed (dry eyes).    . meloxicam (MOBIC) 15 MG tablet Take 15 mg by mouth daily as needed for pain.     Marland Kitchen omeprazole (PRILOSEC) 20 MG capsule Take 20 mg by mouth daily as needed (indigestion).     . ondansetron (ZOFRAN ODT) 4 MG disintegrating tablet Take 1 tablet (4 mg total) by mouth every 8 (eight) hours as needed for nausea or vomiting. (Patient taking differently: Take 4 mg by mouth every 8 (eight) hours as needed for nausea or vomiting (before pain medications). ) 10 tablet 1  . Sacubitril-Valsartan (ENTRESTO PO) Take by mouth.    Marland Kitchen tiZANidine (ZANAFLEX) 4 MG  tablet Take 4 mg by mouth at bedtime.     No current facility-administered medications for this visit.           Allergies  Allergen Reactions  . Penicillins Itching, Rash and Other (See Comments)    PATIENT HAS HAD A PCN REACTION WITH IMMEDIATE RASH, FACIAL/TONGUE/THROAT SWELLING, SOB, OR LIGHTHEADEDNESS WITH HYPOTENSION:  #  #  #  YES  #  #  #   Has patient had a PCN reaction causing severe rash involving mucus membranes or skin necrosis: No Has patient had a PCN reaction that required hospitalization: No Has patient had a PCN reaction occurring within the last 10 years: No If all of the above answers are "NO", then may proceed with Cephalosporin use.     REVIEW OF SYSTEMS (negative unless checked):   Cardiac:  []  Chest pain or chest pressure? []  Shortness of breath upon activity? []  Shortness of breath when lying flat? []  Irregular heart rhythm?  Vascular:  [x]  Pain in calf, thigh, or hip brought on by walking? [x]  Pain in feet at night that wakes you up from your sleep? []  Blood clot in your veins? []  Leg swelling?  Pulmonary:  []  Oxygen at home? []  Productive cough? []  Wheezing?  Neurologic:  []  Sudden weakness in arms or legs? []  Sudden numbness in arms or legs? []  Sudden onset of difficult speaking or slurred speech? []  Temporary loss of vision in one eye? []  Problems with dizziness?  Gastrointestinal:  []  Blood in stool? []  Vomited blood?  Genitourinary:  []  Burning when urinating? []  Blood in urine?  Psychiatric:  []  Major depression  Hematologic:  []  Bleeding problems? []  Problems with blood clotting?  Dermatologic:  []  Rashes or ulcers?  Constitutional:  []  Fever or chills?  Ear/Nose/Throat:  []  Change in hearing? []  Nose bleeds? []  Sore throat?  Musculoskeletal:  []  Back pain? []  Joint pain? []  Muscle pain?   Physical Examination   Vitals:   06/01/17 0613  BP: (!) 152/85  Pulse: 92  Resp: 20  Temp:  97.8 F (36.6 C)  TempSrc: Oral  SpO2: 95%   There is no height or weight on file to calculate BMI.   General Alert, O x 3, WD, NAD  Pulmonary Sym exp, good B air movt, CTA B  Cardiac RRR, Nl S1, S2, no Murmurs, No rubs, No S3,S4  Vascular Vessel Right Left  Radial Palpable Palpable  Brachial Palpable Palpable  Carotid Palpable, No Bruit Palpable, No Bruit  Aorta Not palpable N/A  Femoral Not palpable Palpable  Popliteal Not palpable Not palpable  PT Not palpable Not palpable  DP Not palpable Not palpable    Gastro- intestinal soft, non-distended, non-tender to  palpation, No guarding or rebound, no HSM, no masses, no CVAT B, No palpable prominent aortic pulse,    Musculo- skeletal M/S 5/5 throughout  , Extremities without ischemic changes  , No edema present, No visible varicosities , No Lipodermatosclerosis present, left groin incision is healed  Neurologic Cranial nerves 2-12 intact , Pain and light touch intact in extremities , Motor exam as listed above    Radiology     ImagingResults(Last48hours)  Ct Angio Abdomen Pelvis  W &/or Wo Contrast  Result Date: 04/28/2017 CLINICAL DATA:  Atherosclerosis EXAM: CTA ABDOMEN AND PELVIS wITHOUT AND WITH CONTRAST TECHNIQUE: Multidetector CT imaging of the abdomen and pelvis was performed using the standard protocol during bolus administration of intravenous contrast. Multiplanar reconstructed images and MIPs were obtained and reviewed to evaluate the vascular anatomy. CONTRAST:  15mL ISOVUE-370 IOPAMIDOL (ISOVUE-370) INJECTION 76% COMPARISON:  04/29/2010 FINDINGS: VASCULAR Aorta: Diffuse atherosclerotic calcifications and some smooth plaque is seen throughout the lower thoracic and abdominal aorta. It is nonaneurysmal. Celiac: Patent.  Branch vessels patent. SMA: Patent. Mild atherosclerotic calcification at the origin and mid SMA. Branch vessels grossly patent. Renals: Single renal arteries are patent with atherosclerotic  calcification at the origins. IMA: Moderate disease at the origin. Diminutive vessel. Branch vessels grossly patent. Inflow: There are stents in the right common and external iliac arteries. Right common iliac artery is occluded. Right external iliac artery is occluded. Right internal iliac artery main trunk is occluded. Distal branches reconstitute. There is severe narrowing at the origin of the left common iliac artery with calcified and soft plaque causing narrowing for a length of 2 cm. The distal left common iliac artery is patent to the bifurcation. There is again noted to be significant but moderate narrowing at the origin of the left external iliac artery. The origin of the internal iliac artery is occluded. Branch vessels reconstitute. Proximal Outflow: Bilateral superficial femoral artery occlusion. Veins: No obvious DVT. Contrast reflux into the hepatic veins and IVC indicates an element of right heart dysfunction and elevated right heart pressures. The right atrium is also dilated. Review of the MIP images confirms the above findings. NON-VASCULAR Lower chest: No acute abnormality. Hepatobiliary: No acute focal abnormality of the liver. Gallbladder is unremarkable. Pancreas: Unremarkable Spleen: Unremarkable Adrenals/Urinary Tract: Tiny cyst in the medial lower pole of the left kidney is stable. Right kidney and adrenal glands are unremarkable. Bladder is decompressed. Stomach/Bowel: Stomach and duodenum are within normal limits. There is no evidence of small-bowel obstruction. Prominent stool burden throughout the colon. No obvious mass in the colon. Lymphatic: No abnormal retroperitoneal adenopathy. Reproductive: Prostate is within normal limits. Other: Small amount of free fluid in the pelvis is nonspecific. Musculoskeletal: No vertebral compression deformity. IMPRESSION: VASCULAR Diffuse atherosclerotic changes of the aorta without aneurysm. 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 This has  progressed. Significant narrowing at the origins of the left common and external iliac arteries. Stable at the origin of the left common iliac artery and progressed at the origin of the left external iliac artery. Right superficial femoral artery occlusion is new. Left superficial femoral artery occlusion is stable. NON-VASCULAR Small amount of free fluid in the pelvis is nonspecific. Electronically Signed   By: Jolaine Click M.D.   On: 04/28/2017 12:46    I reviewed this patient's CTA, e has scattered calcific atherosclerosis in the aorta that may require a limited aortic endarterectomy at the aortic neck.  Both CFA are small and under perfused.  There is posterior  plaquing bilaterally.  The left femoral bifurcation appears to be high.   Medical Decision Making   Wayne [name] is a 62 y.o. male who presents with: BLE critical limb ischemia due to severe aortoiliac disease   Based on the patient's vascular studies and examination, I have offered the patient: aortobifemoral graft.  The patient is aware the risks of aortic surgery include but are not limited to: bleeding, need for transfusion, infection, death, stroke, paralysis, wound complications, bowel injuries, impotence, bowel ischemia, extended ventilation and future ventral hernias.    Overall, I cited a mortality rate of 5-10% and morbidity rate of 30%.  The patient has agreed to proceed and will be scheduled: 5 MAR 19.  I discussed with the patient hold on further smoking for the next couple of weeks to give him a better chance of avoid extended intubation and ventilation.  I discussed in depth with the patient the nature of atherosclerosis, and emphasized the importance of maximal medical management including strict control of blood pressure, blood glucose, and lipid levels, antiplatelet agents, obtaining regular exercise, and cessation of smoking.    The patient is aware that without maximal medical management the underlying  atherosclerotic disease process will progress, limiting the benefit of any interventions.  The patient is currently not on a statin. Patient will be started on Lipitor 10 mg PO daily, to be titrated and managed by their primary physician.   The patient is currently on an anti-platelet: ASA.  Thank you for allowing [place] to participate in this patient's care.   Leonides Sake, MD, FACS [facility] Office: [phone] Pager: [phone]

## 2017-06-01 NOTE — Progress Notes (Signed)
Notified that patient had emesis with increase in O2 requirements.  KB and CXR ordered.  NG tube advanced 5 cm. Bilateral pulmonary edema.  Patient given 20 IV lasix ON CIWA protocol.  Ativan withheld as he is somewhat somnelent.  Abdomen is soft  Probably had some aspiration, so will need to monitor pulmonary status closely.  Wayne [name]

## 2017-06-01 NOTE — Anesthesia Procedure Notes (Signed)
Arterial Line Insertion Start/End3/26/2019 7:05 AM, 06/01/2017 7:20 AM Performed by: White, Cordella Register, Scientist, clinical (histocompatibility and immunogenetics), CRNA  Patient location: Pre-op. Preanesthetic checklist: patient identified, IV checked, site marked, risks and benefits discussed, surgical consent, monitors and equipment checked, pre-op evaluation and anesthesia consent Lidocaine 1% used for infiltration and patient sedated Left, radial was placed Catheter size: 20 G Hand hygiene performed  and maximum sterile barriers used   Attempts: 2 Procedure performed without using ultrasound guided technique. Ultrasound Notes:anatomy identified, needle tip was noted to be adjacent to the nerve/plexus identified and no ultrasound evidence of intravascular and/or intraneural injection Following insertion, dressing applied and Biopatch. Post procedure assessment: normal  Patient tolerated the procedure well with no immediate complications.

## 2017-06-01 NOTE — Anesthesia Procedure Notes (Signed)
Central Venous Catheter Insertion Performed by: Annye Asa, MD, anesthesiologist Start/End3/26/2019 6:50 AM, 06/01/2017 7:01 AM Preanesthetic checklist: patient identified, IV checked, risks and benefits discussed, surgical consent, monitors and equipment checked, pre-op evaluation, timeout performed and anesthesia consent Position: supine Lidocaine 1% used for infiltration and patient sedated Hand hygiene performed , maximum sterile barriers used  and Seldinger technique used Catheter size: 8.5 Fr PA cath was placed.Sheath introducer Swan type:thermodilution Procedure performed using ultrasound guided technique. Ultrasound Notes:anatomy identified, needle tip was noted to be adjacent to the nerve/plexus identified, no ultrasound evidence of intravascular and/or intraneural injection and image(s) printed for medical record Attempts: 1 Following insertion, line sutured, dressing applied and Biopatch. Post procedure assessment: blood return through all ports, free fluid flow and no air  Patient tolerated the procedure well with no immediate complications. Additional procedure comments: PA catheter:  Routine monitors. Timeout, sterile prep, drape, FBP R neck. Supine position.  1% Lido local, finder and trocar RIJ 1st pass with US guidance.  Cordis placed over J wire. PA catheter in easily.  Sterile dressing applied.  Patient tolerated well, VSS.  Jenita Seashore, MD.

## 2017-06-01 NOTE — Transfer of Care (Signed)
Immediate Anesthesia Transfer of Care Note  Patient: Wayne [name]  Procedure(s) Performed: AORTA BIFEMORAL BYPASS GRAFT with Graft and Right Femoral Endartarectomy (N/A Abdomen)  Patient Location: PACU  Anesthesia Type:General  Level of Consciousness: awake and alert   Airway & Oxygen Therapy: Patient Spontanous Breathing and Patient connected to face mask oxygen  Post-op Assessment: Report given to RN, Post -op Vital signs reviewed and stable and Patient moving all extremities X 4  Post vital signs: Reviewed and stable  Last Vitals:  Vitals Value Taken Time  BP 178/62 06/01/2017  1:02 PM  Temp    Pulse 89 06/01/2017  1:08 PM  Resp 15 06/01/2017  1:08 PM  SpO2 95 % 06/01/2017  1:08 PM  Vitals shown include unvalidated device data.  Last Pain:  Vitals:   06/01/17 1610  TempSrc: Oral         Complications: No apparent anesthesia complications

## 2017-06-01 NOTE — Consult Note (Addendum)
Cardiology Consultation:   Patient ID: Wayne [name]; [id number]; Jul 19, 1955   Admit date: 06/01/2017 Date of Consult: 06/01/2017  Primary Care Provider: Elinor Dodge, MD Primary Cardiologist: Arnette Felts PA-C  Chief Complaint: here for vascular surgery  Patient Profile:   Wayne [name] is a 62 y.o. male with a hx of chronic systolic CHF (EF 40-98% by echo 2017, 40-45% by echo in 01/2017), severe pulm HTN by echo 01/2017, ?MI 2004 (details unknown), moderate MR/mild-mod TR, PAD, arthritis, COPD, GERD, HTN, HLD, pre-diabetes, alcohol abuse, malnutrition, chronic appearing hyponatremia, noncompliance with Entresto who is being seen today for the evaluation of elevated PA pressure at the request of Dr. Richardson Landry.  History of Present Illness:   History is limited by patient's post-op status (sleepy s/p dilaudid) and lack of available medical records. We did fortunately get some expedited faxes from [facility] and have some information in Epic. Per CareEverywhere, a) 2D echo in 2017 showed mild LVH, EF 20-25% (severe global hypokinesis), mildly dilated and hypokinetic right ventricle, moderate mitral regurgitation, mild-moderate tricuspid regurgitation, mild pulm HTN and b) nuclear stress test 08/2016 showed no significant reversible ischemia or infarction, mild global hypokinesis, EF 40%. The fax indicates an echo 01/2017 showing EF 40-45%, mildly impaired RV function, moderate mitral regurgitation, mild-mod TR, severe pulm HTN with RVSP 63.  The patient was seen by pre-admit NP 05/25/17 who outlines he has history of MI '04 (but someone entered into PMH that patient thought this was related to bleeding ulcer). He was initially scheduled 3/5 for PV procedure but was dyspneic, hypoxic, and febrile. He was was sent to the ED for evaluation/admission (COPD exacerbation, + RSV) with plans to delay surgery for at least 2 weeks to allow patient to recover from respiratory illness.  He was able to be weaned off oxygen by time of discharge. Other notable diagnoses include alcohol abuse, malnutrition and hyponatremia. He was on Entresto at that time of discharge but home cardiac med list this admission includes aspirin, Lasix, Lipitor (20). He is not on any other CHF medications now for unclear reasons although [facility] Radon note indicates hx of noncompliance with Entresto. The pre-admission NP's note from 05/25/17 also outlines "Last pulmonology note with Dr. Bethanie Dicker 04/06/17.He is following for CHF, lung mass, COPD, pulmonary mycosis. His note states that patientis currently able to do activities of daily living without limitation and able to do housework without limitations.26-month follow-up planned with chest CT at the next visit." He was admitted today for aortobifem bypass grafting by Dr. Chen/Dr. Edilia Bo. The op note also mentions cirrhotic liver with small amount of ascites. Intraoperatively the patient was noted to have high pulmonary artery pressures by swan so anesthesia started milrinone. We are asked to manage. He is hypertensive post-operatively and has received 2 doses of labetalol and also dilaudid for pain control. He knows he is at [facility] and answers some questions but falls asleep easily.   Past Medical History:  Diagnosis Date  . Abnormal pulmonary finding    a. preadmission note indicates pt followed by Dr. Bethanie Dicker for CHF, lung mass, COPD, pulmonary mycosis with plan for 3 mo f/u with CT by 03/2017 note.  . Alcohol abuse   . Anemia   . Arthritis    "right hip; lower back" (05/11/2017)  . Chronic systolic CHF (congestive heart failure) (HCC)   . COPD (chronic obstructive pulmonary disease) (HCC)   . Dyspnea   . GERD (gastroesophageal reflux disease)   .  History of blood transfusion 2004   "related to bleeding stomach ulcerts"  . History of stomach ulcers 2004  . Hypercholesterolemia   . Hypertension   . Malnutrition (HCC)   .  Moderate mitral regurgitation   . Myocardial infarction [facility]) 2004   "think it was bleeding ulcers; not a heart attack" (05/11/2017), pt denies heart cath  . Peripheral vascular disease (HCC)   . Pre-diabetes   . Pulmonary hypertension (HCC)    a. mild by echo 2017.  . Tricuspid regurgitation 2017    Past Surgical History:  Procedure Laterality Date  . ABDOMINAL AORTOGRAM W/LOWER EXTREMITY N/A 04/01/2017   Procedure: ABDOMINAL AORTOGRAM W/LOWER EXTREMITY;  Surgeon: Fransisco Hertz, MD;  Location: [facility] INVASIVE CV LAB;  Service: Cardiovascular;  Laterality: N/A;  Bilateral  . COLONOSCOPY    . HEMORRHOIDECTOMY WITH HEMORRHOID BANDING    . MASS EXCISION Left 04/13/2017   Procedure: EXCISION EPIDERMAL INCLUSION CYST LEFT GROIN;  Surgeon: Fransisco Hertz, MD;  Location: [facility] OR;  Service: Vascular;  Laterality: Left;  . PERIPHERAL VASCULAR INTERVENTION Left    "groin; for clogged artery"     Inpatient Medications: Scheduled Meds: . chlorhexidine  60 mL Topical Once   And  . [START ON 06/02/2017] chlorhexidine  60 mL Topical Once  . HYDROmorphone      . HYDROmorphone      . labetalol       Continuous Infusions: . sodium chloride    . sodium chloride    . acetaminophen    . milrinone 0.3 mcg/kg/min (06/01/17 1039)   PRN Meds: acetaminophen, HYDROcodone-acetaminophen, HYDROmorphone (DILAUDID) injection, labetalol, meperidine (DEMEROL) injection, promethazine  Home Meds: Prior to Admission medications   Medication Sig Start Date End Date Taking? Authorizing Provider  acetaminophen (TYLENOL) 500 MG tablet Take 2 tablets (1,000 mg total) by mouth every 6 (six) hours as needed. Patient taking differently: Take 250 mg by mouth daily as needed for moderate pain or headache.  02/17/16  Yes Roxy Horseman, PA-C  albuterol (PROVENTIL HFA;VENTOLIN HFA) 108 (90 Base) MCG/ACT inhaler Inhale 2 puffs into the lungs every 6 (six) hours as needed for wheezing or shortness of breath.   Yes [provider]  aspirin EC 81 MG tablet Take 81 mg by mouth daily.   Yes [provider]  budesonide-formoterol (SYMBICORT) 160-4.5 MCG/ACT inhaler Inhale 2 puffs into the lungs 2 (two) times daily.   Yes [provider]  folic acid (FOLVITE) 1 MG tablet Take 1 tablet (1 mg total) by mouth daily. 05/15/17  Yes Mikhail, Nita Sells, DO  furosemide (LASIX) 20 MG tablet Take 1 tablet (20 mg total) by mouth daily. 05/15/17  Yes Mikhail, Nita Sells, DO  omeprazole (PRILOSEC) 20 MG capsule Take 1 capsule (20 mg total) by mouth daily as needed (for indegestion). 05/14/17  Yes Mikhail, Nita Sells, DO  predniSONE (DELTASONE) 20 MG tablet Take in the morning: Take 3 tablets x 3days, then take 2 tablets x 3days, then take 1 tablet x 3days. 05/15/17  Yes Mikhail, Maryann, DO  atorvastatin (LIPITOR) 20 MG tablet Take 20 mg by mouth daily.    [provider]  doxycycline (VIBRAMYCIN) 50 MG capsule Take 1 capsule (50 mg total) by mouth 2 (two) times daily. Completion of hospital course Patient not taking: Reported on 05/18/2017 05/14/17   Edsel Petrin, DO  feeding supplement, ENSURE ENLIVE, (ENSURE ENLIVE) LIQD Take 237 mLs by mouth 2 (two) times daily between meals. Patient not taking: Reported on 05/18/2017 05/14/17  Mikhail, Nita Sells, DO  guaiFENesin (MUCINEX) 600 MG 12 hr tablet Take 1 tablet (600 mg total) by mouth 2 (two) times daily. Patient not taking: Reported on 05/18/2017 05/14/17   Edsel Petrin, DO  Multiple Vitamin (MULTIVITAMIN WITH MINERALS) TABS tablet Take 1 tablet by mouth daily. Patient not taking: Reported on 05/18/2017 05/15/17   Edsel Petrin, DO  nicotine (NICODERM CQ - DOSED IN MG/24 HOURS) 21 mg/24hr patch Place 1 patch (21 mg total) onto the skin daily. Patient not taking: Reported on 05/18/2017 05/15/17   Edsel Petrin, DO  ondansetron (ZOFRAN ODT) 4 MG disintegrating tablet Take 1 tablet (4 mg total) by mouth every 8 (eight) hours as needed for nausea or vomiting. Patient  taking differently: Take 4 mg by mouth every 8 (eight) hours as needed for nausea or vomiting (before pain medications).  01/27/16   Molpus, John, MD  thiamine 100 MG tablet Take 1 tablet (100 mg total) by mouth daily. 05/15/17   Edsel Petrin, DO    Allergies:    Allergies  Allergen Reactions  . Penicillins Itching, Rash and Other (See Comments)    PATIENT HAS HAD A PCN REACTION WITH IMMEDIATE RASH, FACIAL/TONGUE/THROAT SWELLING, SOB, OR LIGHTHEADEDNESS WITH HYPOTENSION:  #  #  #  YES  #  #  #   Has patient had a PCN reaction causing severe rash involving mucus membranes or skin necrosis: No Has patient had a PCN reaction that required hospitalization: No Has patient had a PCN reaction occurring within the last 10 years: No If all of the above answers are "NO", then may proceed with Cephalosporin use.     Social History:   Social History   Socioeconomic History  . Marital status: Married    Spouse name: Not on file  . Number of children: Not on file  . Years of education: Not on file  . Highest education level: Not on file  Occupational History  . Not on file  Social Needs  . Financial resource strain: Not on file  . Food insecurity:    Worry: Not on file    Inability: Not on file  . Transportation needs:    Medical: Not on file    Non-medical: Not on file  Tobacco Use  . Smoking status: Current Every Day Smoker    Packs/day: 0.25    Years: 50.00    Pack years: 12.50    Types: Cigarettes  . Smokeless tobacco: Never Used  Substance and Sexual Activity  . Alcohol use: Yes    Alcohol/week: 4.8 oz    Types: 8 Cans of beer per week  . Drug use: No  . Sexual activity: Never  Lifestyle  . Physical activity:    Days per week: Not on file    Minutes per session: Not on file  . Stress: Not on file  Relationships  . Social connections:    Talks on phone: Not on file    Gets together: Not on file    Attends religious service: Not on file    Active member of club or  organization: Not on file    Attends meetings of clubs or organizations: Not on file    Relationship status: Not on file  . Intimate partner violence:    Fear of current or ex partner: Not on file    Emotionally abused: Not on file    Physically abused: Not on file    Forced sexual activity: Not on file  Other Topics Concern  .  Not on file  Social History Narrative  . Not on file    Family History:   The patient's family history includes Diabetes in his sister; Hyperlipidemia in his sister; Hypertension in his sister; Peripheral Artery Disease in his father.*  ROS:  Unable to reliably obtain from patient  Physical Exam/Data:   Vitals:   06/01/17 1500 06/01/17 1515 06/01/17 1530 06/01/17 1532  BP: (!) 155/89 (!) 157/92  (!) 147/87  Pulse: 72 72 75 76  Resp: 10 18 11 15   Temp:      TempSrc:      SpO2: 92% 95% 94% 92%    Intake/Output Summary (Last 24 hours) at 06/01/2017 1540 Last data filed at 06/01/2017 1240 Gross per 24 hour  Intake 3480 ml  Output 965 ml  Net 2515 ml    General: Chronically ill, malnourished appearing AAM in no acute distress. Sedated post-op Head: Normocephalic, atraumatic, sclera w/ bilateral icterus, no xanthomas, nares are without discharge. Neck: Supple RIJ swan. VCP elevated. Carotids 2+ no thyromegaly or LAD Lungs: Clear bilaterally to auscultation anteriorly without wheezes, rales, or rhonchi. Breathing is unlabored. Heart: RRR with S1 S2. RV lift prominent P2 2/6 TR Abdomen: surgical dressings in place.mildly distended quiet Msk:  Strength and tone appear normal for age. Extremities: No cyanosis. No edema. Mild clubbing Neuro: Alert and oriented to self, place, but falls asleep easily. Follows commands Psych: Flattened affect, sleepy at times  EKG:  The EKG was personally reviewed and demonstrates NSR 84bpm, anterior TWI V2-V3 with flattening V4-V6, avL. QTc  Relevant CV Studies: As referenced above  Laboratory  Data:  Chemistry Recent Labs  Lab 06/01/17 1330  NA 127*  K 4.4  CL 98*  CO2 18*  GLUCOSE 140*  BUN <5*  CREATININE 0.75  CALCIUM 8.0*  GFRNONAA >60  GFRAA >60  ANIONGAP 11    Recent Labs  Lab 06/01/17 1330  PROT 6.1*  ALBUMIN 3.1*  AST 23  ALT 13*  ALKPHOS 106  BILITOT 2.6*   Hematology Recent Labs  Lab 06/01/17 1330  WBC 14.8*  RBC 4.07*  HGB 13.9  HCT 40.7  MCV 100.0  MCH 34.2*  MCHC 34.2  RDW 14.1  PLT 195   Cardiac EnzymesNo results for input(s): TROPONINI in the last 168 hours. No results for input(s): TROPIPOC in the last 168 hours.  BNPNo results for input(s): BNP, PROBNP in the last 168 hours.  DDimer No results for input(s): DDIMER in the last 168 hours.  Radiology/Studies:  Dg Chest Port 1 View  Result Date: 06/01/2017 CLINICAL DATA:  Post aortobifem bypass graft EXAM: PORTABLE CHEST 1 VIEW COMPARISON:  CT angio chest abdomen pelvis of 05/11/2017 FINDINGS: Very prominent interstitial markings are noted throughout the lungs. Some of these markings may be chronic, but superimposed interstitial edema is a consideration. No definite pleural effusion is noted. The heart is mildly enlarged. Swan-Ganz catheter tip is noted within the left main pulmonary artery. NG tube extends below the hemidiaphragm. IMPRESSION: 1. Very prominent interstitial markings some of which may be chronic, but superimposed interstitial edema cannot be excluded. No definite effusion. 2. Stable cardiomegaly with Swan-Ganz catheter tip within the left main pulmonary artery. Electronically Signed   By: Dwyane Dee M.D.   On: 06/01/2017 13:51   Dg Abd Portable 1v  Result Date: 06/01/2017 CLINICAL DATA:  Status post aortobifemoral bypass graft. EXAM: PORTABLE ABDOMEN - 1 VIEW COMPARISON:  04/28/2017 CT abdomen/pelvis FINDINGS: Enteric tube terminates in the proximal stomach.  Vertical skin staples are noted just to the left of the lumbar spine. Extensive aorto bi-iliac atherosclerotic  calcifications are noted with right common iliac stent graft. No disproportionately dilated small bowel loops. No evidence of pneumatosis or pneumoperitoneum. Mild colonic stool. No radiopaque nephrolithiasis. IMPRESSION: 1. Enteric tube terminates in the proximal stomach. 2. Nonobstructive bowel gas pattern. Electronically Signed   By: Delbert Phenix M.D.   On: 06/01/2017 13:51    Assessment and Plan:   1. PAD s/p aortobifem bypass grafting POD #0 2. Severe pulmonary HTN with elevated PA pressures by swan 3. Chronic systolic CHF EF 40-45% 4. COPD with lung mass/pulmonary mycosis 5. Alcohol abuse 6. Malnutrition 7. Essential HTN 8. Hyperlipidemia  Patient seen/examined with Dr. Gala Romney with advanced CHF team. He agrees with milrinone and recommends to wean down as we are able - wedge is 19. Will need further w/u of pulm HTN down the line but first priority is to manage him through the post-operative state. Dr. Gala Romney will be writing further orders for co-ox and sildenafil - see below for comprehensive thoughts.  For questions or updates, please contact [facility] Please consult [email or web address] for contact info under Cardiology/STEMI.    Signed, Laurann Montana, PA-C  06/01/2017 3:40 PM   Patient seen and examined with Ronie Spies, PA-C. We discussed all aspects of the encounter. I agree with the assessment and plan as stated above.   62 y/o male with multiple medical problems that we are asked to see by Dr. Richardson Landry due to intra-operative discovery of severe pulmonary HTN.  I have reviewed patients records extensively. Appears he has h/o NICM possibly due to ETOH. However recent EF has improved to 40-45% despite non-compliance with therapy. Previous echos do suggest PH but not as severe as what as discovered today in the operating room during elective aorto-bifem grafting. Baseline functional status unclear but seems relatively poor.   In OR found to have pulmonary pressures in the 90s  and started on milrinone 0.3 mcg/kg/min. Fortunately he tolerated surgery well. In PACU he is sleepy but arousable. On exam He is cachetic. No distress.  Mild clubbing on exam but no edema. Extremities are warm. He has an RV lift with mild TR murmur.   PA pressures 90/40. I advanced and wedged the PA catheter myself and PCWP was 19. SBP was 180s. Thermodilution not set up.  He appeared warm and well perfused and thus I suspect PAH is long standing and reasonably compensated. Subsequent MV sat was 64%. Will continue milrinone for now and add sildenafil. 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 Given non-compliance and co-morbidities doubt he will be candidate for aggressive therapy but we will see. We will get echo in am and we will eventually need PFTs and VQ scan when he can tolerate. Will wean milrinone slowly in the post-operative period.   CRITICAL CARE Performed by: Arvilla Meres  Total critical care time: 35 minutes  Critical care time was exclusive of separately billable procedures and treating other patients.  Critical care was necessary to treat or prevent imminent or life-threatening deterioration.  Critical care was time spent personally by me (independent of midlevel providers or residents) on the following activities: development of treatment plan with patient and/or surrogate as well as nursing, discussions with consultants, evaluation of patient's response to treatment, examination of patient, obtaining history from patient or surrogate, ordering and performing treatments and interventions, ordering and review of laboratory studies,  ordering and review of radiographic studies, pulse oximetry and re-evaluation of patient's condition.  Arvilla Meres, MD  9:58 PM

## 2017-06-01 NOTE — Progress Notes (Signed)
Dr Richardson Landry at bedside to evaluate pt,. Swan readings high as well as BP, Milrinone Gtt infusing at 0.3 mcgs , Swan measuring mark at 50, aware of status and VS

## 2017-06-02 ENCOUNTER — Encounter (HOSPITAL_COMMUNITY): Payer: Self-pay | Admitting: Vascular Surgery

## 2017-06-02 ENCOUNTER — Inpatient Hospital Stay (HOSPITAL_COMMUNITY): Payer: Medicaid Other

## 2017-06-02 ENCOUNTER — Other Ambulatory Visit: Payer: Self-pay

## 2017-06-02 DIAGNOSIS — J9601 Acute respiratory failure with hypoxia: Secondary | ICD-10-CM

## 2017-06-02 LAB — BLOOD GAS, ARTERIAL
Acid-base deficit: 1.7 mmol/L (ref 0.0–2.0)
BICARBONATE: 22.6 mmol/L (ref 20.0–28.0)
O2 Content: 3 L/min
O2 Saturation: 97 %
PATIENT TEMPERATURE: 98.6
PH ART: 7.387 (ref 7.350–7.450)
pCO2 arterial: 38.4 mmHg (ref 32.0–48.0)
pO2, Arterial: 96.2 mmHg (ref 83.0–108.0)

## 2017-06-02 LAB — COMPREHENSIVE METABOLIC PANEL
ALBUMIN: 2.7 g/dL — AB (ref 3.5–5.0)
ALK PHOS: 92 U/L (ref 38–126)
ALT: 15 U/L — AB (ref 17–63)
ANION GAP: 10 (ref 5–15)
AST: 26 U/L (ref 15–41)
BUN: 5 mg/dL — ABNORMAL LOW (ref 6–20)
CALCIUM: 8.1 mg/dL — AB (ref 8.9–10.3)
CHLORIDE: 99 mmol/L — AB (ref 101–111)
CO2: 19 mmol/L — AB (ref 22–32)
Creatinine, Ser: 0.63 mg/dL (ref 0.61–1.24)
GFR calc Af Amer: >60 mL/min (ref >60)
GFR calc non Af Amer: >60 mL/min (ref >60)
GLUCOSE: 183 mg/dL — AB (ref 65–99)
Potassium: 4.6 mmol/L (ref 3.5–5.1)
SODIUM: 128 mmol/L — AB (ref 135–145)
Total Bilirubin: 1.1 mg/dL (ref 0.3–1.2)
Total Protein: 5.7 g/dL — ABNORMAL LOW (ref 6.5–8.1)

## 2017-06-02 LAB — POCT I-STAT 7, (LYTES, BLD GAS, ICA,H+H)
Acid-base deficit: 5 mmol/L — ABNORMAL HIGH (ref 0.0–2.0)
Acid-base deficit: 7 mmol/L — ABNORMAL HIGH (ref 0.0–2.0)
Bicarbonate: 20.8 mmol/L (ref 20.0–28.0)
Bicarbonate: 20.6 mmol/L (ref 20.0–28.0)
Calcium, Ion: 1.16 mmol/L (ref 1.15–1.40)
Calcium, Ion: 1.06 mmol/L — ABNORMAL LOW (ref 1.15–1.40)
HCT: 42 % (ref 39.0–52.0)
HCT: 38 % — ABNORMAL LOW (ref 39.0–52.0)
HEMOGLOBIN: 12.9 g/dL — AB (ref 13.0–17.0)
Hemoglobin: 14.3 g/dL (ref 13.0–17.0)
O2 SAT: 100 %
O2 Saturation: 93 %
PCO2 ART: 41.6 mmHg (ref 32.0–48.0)
PCO2 ART: 46.9 mmHg (ref 32.0–48.0)
PH ART: 7.25 — AB (ref 7.350–7.450)
PO2 ART: 190 mmHg — AB (ref 83.0–108.0)
PO2 ART: 79 mmHg — AB (ref 83.0–108.0)
Patient temperature: 37
Potassium: 3.7 mmol/L (ref 3.5–5.1)
Potassium: 4.3 mmol/L (ref 3.5–5.1)
Sodium: 128 mmol/L — ABNORMAL LOW (ref 135–145)
Sodium: 128 mmol/L — ABNORMAL LOW (ref 135–145)
TCO2: 22 mmol/L (ref 22–32)
TCO2: 22 mmol/L (ref 22–32)
pH, Arterial: 7.307 — ABNORMAL LOW (ref 7.350–7.450)

## 2017-06-02 LAB — AMYLASE: AMYLASE: 83 U/L (ref 28–100)

## 2017-06-02 LAB — COOXEMETRY PANEL
CARBOXYHEMOGLOBIN: 2.2 % — AB (ref 0.5–1.5)
Methemoglobin: 0.8 % (ref 0.0–1.5)
O2 SAT: 55.6 %
TOTAL HEMOGLOBIN: 14.6 g/dL (ref 12.0–16.0)

## 2017-06-02 LAB — CBC
HCT: 37.5 % — ABNORMAL LOW (ref 39.0–52.0)
HEMOGLOBIN: 12.8 g/dL — AB (ref 13.0–17.0)
MCH: 33.5 pg (ref 26.0–34.0)
MCHC: 34.1 g/dL (ref 30.0–36.0)
MCV: 98.2 fL (ref 78.0–100.0)
PLATELETS: 183 10*3/uL (ref 150–400)
RBC: 3.82 MIL/uL — AB (ref 4.22–5.81)
RDW: 13.4 % (ref 11.5–15.5)
WBC: 14.9 10*3/uL — ABNORMAL HIGH (ref 4.0–10.5)

## 2017-06-02 LAB — MRSA PCR SCREENING: MRSA by PCR: NEGATIVE

## 2017-06-02 LAB — PROCALCITONIN: PROCALCITONIN: 0.26 ng/mL

## 2017-06-02 LAB — MAGNESIUM: MAGNESIUM: 1.7 mg/dL (ref 1.7–2.4)

## 2017-06-02 MED ORDER — BISACODYL 10 MG RE SUPP
10.0000 mg | Freq: Once | RECTAL | Status: AC
Start: 2017-06-02 — End: 2017-06-02
  Administered 2017-06-02: 10 mg via RECTAL

## 2017-06-02 MED ORDER — PANTOPRAZOLE SODIUM 40 MG IV SOLR
40.0000 mg | INTRAVENOUS | Status: DC
  Administered 2017-06-02 – 2017-06-03 (×2): 40 mg via INTRAVENOUS
  Filled 2017-06-02 (×2): qty 40

## 2017-06-02 MED ORDER — BUDESONIDE 0.5 MG/2ML IN SUSP
0.5000 mg | Freq: Two times a day (BID) | RESPIRATORY_TRACT | Status: DC
  Administered 2017-06-02 – 2017-06-07 (×11): 0.5 mg via RESPIRATORY_TRACT
  Filled 2017-06-02 (×11): qty 2

## 2017-06-02 MED ORDER — SODIUM CHLORIDE 0.9% FLUSH
10.0000 mL | Freq: Two times a day (BID) | INTRAVENOUS | Status: DC
  Administered 2017-06-02 – 2017-06-04 (×4): 10 mL

## 2017-06-02 MED ORDER — PNEUMOCOCCAL VAC POLYVALENT 25 MCG/0.5ML IJ INJ: 0.5000 mL | INJECTION | INTRAMUSCULAR | Status: DC | PRN

## 2017-06-02 MED ORDER — MILRINONE LACTATE IN DEXTROSE 20-5 MG/100ML-% IV SOLN: 0.1250 ug/kg/min | INTRAVENOUS | Status: DC

## 2017-06-02 MED ORDER — NICOTINE 21 MG/24HR TD PT24
21.0000 mg | MEDICATED_PATCH | Freq: Every day | TRANSDERMAL | Status: DC
  Administered 2017-06-02 – 2017-06-07 (×6): 21 mg via TRANSDERMAL
  Filled 2017-06-02 (×6): qty 1

## 2017-06-02 MED ORDER — SODIUM CHLORIDE 0.9 % IV SOLN
INTRAVENOUS | Status: DC
  Administered 2017-06-02 – 2017-06-03 (×2): via INTRAVENOUS

## 2017-06-02 MED ORDER — IPRATROPIUM-ALBUTEROL 0.5-2.5 (3) MG/3ML IN SOLN: 3.0000 mL | RESPIRATORY_TRACT | Status: DC | PRN

## 2017-06-02 MED ORDER — SODIUM CHLORIDE 0.9% FLUSH: 10.0000 mL | INTRAVENOUS | Status: DC | PRN

## 2017-06-02 MED ORDER — CHLORHEXIDINE GLUCONATE CLOTH 2 % EX PADS
6.0000 | MEDICATED_PAD | Freq: Every day | CUTANEOUS | Status: DC
  Administered 2017-06-02 – 2017-06-04 (×3): 6 via TOPICAL

## 2017-06-02 MED ORDER — IPRATROPIUM-ALBUTEROL 0.5-2.5 (3) MG/3ML IN SOLN
3.0000 mL | Freq: Four times a day (QID) | RESPIRATORY_TRACT | Status: DC
  Administered 2017-06-02 (×2): 3 mL via RESPIRATORY_TRACT
  Filled 2017-06-02 (×2): qty 3

## 2017-06-02 MED ORDER — FUROSEMIDE 10 MG/ML IJ SOLN
40.0000 mg | Freq: Once | INTRAMUSCULAR | Status: AC
  Administered 2017-06-02: 40 mg via INTRAVENOUS
  Filled 2017-06-02: qty 4

## 2017-06-02 MED ORDER — DEXTROSE-NACL 5-0.9 % IV SOLN
INTRAVENOUS | Status: DC
  Administered 2017-06-02: 09:00:00 via INTRAVENOUS

## 2017-06-02 MED ORDER — MAGNESIUM SULFATE 2 GM/50ML IV SOLN
2.0000 g | Freq: Once | INTRAVENOUS | Status: AC
  Administered 2017-06-02: 2 g via INTRAVENOUS

## 2017-06-02 MED ORDER — INFLUENZA VAC SPLIT QUAD 0.5 ML IM SUSY: 0.5000 mL | PREFILLED_SYRINGE | INTRAMUSCULAR | Status: DC | PRN

## 2017-06-02 MED FILL — Sodium Chloride Irrigation Soln 0.9%: Qty: 3000 | Status: AC

## 2017-06-02 MED FILL — Sodium Chloride IV Soln 0.9%: INTRAVENOUS | Qty: 1000 | Status: AC

## 2017-06-02 MED FILL — Heparin Sodium (Porcine) Inj 1000 Unit/ML: INTRAMUSCULAR | Qty: 30 | Status: AC

## 2017-06-02 NOTE — Plan of Care (Signed)
  Problem: Clinical Measurements: Goal: Will remain free from infection Outcome: Progressing Goal: Respiratory complications will improve Outcome: Progressing Goal: Cardiovascular complication will be avoided Outcome: Progressing   Problem: Coping: Goal: Level of anxiety will decrease Outcome: Progressing   Problem: Elimination: Goal: Will not experience complications related to bowel motility Outcome: Progressing Goal: Will not experience complications related to urinary retention Outcome: Progressing   Problem: Pain Managment: Goal: General experience of comfort will improve Outcome: Progressing   Problem: Safety: Goal: Ability to remain free from injury will improve Outcome: Progressing   Problem: Skin Integrity: Goal: Risk for impaired skin integrity will decrease Outcome: Progressing   Problem: Education: Goal: Knowledge of General Education information will improve Outcome: Not Progressing   Problem: Health Behavior/Discharge Planning: Goal: Ability to manage health-related needs will improve Outcome: Not Progressing   Problem: Clinical Measurements: Goal: Ability to maintain clinical measurements within normal limits will improve Outcome: Not Progressing Goal: Diagnostic test results will improve Outcome: Not Progressing   Problem: Activity: Goal: Risk for activity intolerance will decrease Outcome: Not Progressing   Problem: Nutrition: Goal: Adequate nutrition will be maintained Outcome: Not Progressing

## 2017-06-02 NOTE — Progress Notes (Signed)
Pt presently more alert and oriented to person place and time. Pt still disoriented to situation but is now able to communicate needs and follow commands to cough and deep breath.

## 2017-06-02 NOTE — Progress Notes (Signed)
Occupational Therapy Evaluation Patient Details Name: Wayne [name] MRN: [id number] DOB: Mar 24, 1955 Today's Date: 06/02/2017    History of Present Illness Pt is a 62 y.o. male who presents 06/01/17 with progressive BLE ischemia; now s/p aortofemoral bypass grafting and right femoral endartarectomy. Pt with concerns for aspiration post-op. PMH includes pulmonary HTN, PVD, MI, HTN, COPD, CHF, alcohol abuse, athritis.   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 Pt is currently requiring mod A overall for ADL, min A +2 for transfers for balance and safety. Pt with decreased arousal and cognition throughout session. Pt will require skilled OT in the acute setting and afterwards at the SNF level to return to PLOF - maximizing independence in ADL and functional transfers. Next session to work on standing balance at sink.   Follow Up Recommendations  SNF;Supervision/Assistance - 24 hour(watch for progression as cognition improves)    Equipment Recommendations  Other (comment)(defer to next venue)    Recommendations for Other Services       Precautions / Restrictions Precautions Precautions: Fall Precaution Comments: A-line, central line, NGT Restrictions Weight Bearing Restrictions: No      Mobility Bed Mobility Overal bed mobility: Needs Assistance Bed Mobility: Supine to Sit     Supine to sit: Min guard;HOB elevated     General bed mobility comments: Assist for lines/leads; cues for sequencing and to attend to task  Transfers Overall transfer level: Needs assistance Equipment used: 4-wheeled walker(EVA walker) Transfers: Sit to/from Stand Sit to Stand: Min assist;+2 safety/equipment         General transfer comment: MinA for balance and to steady RW upon standing; required repeated cues for safe hand placement when preparing to stand    Balance Overall balance assessment: Needs assistance Sitting-balance  support: Bilateral upper extremity supported;Feet supported Sitting balance-Leahy Scale: Fair Sitting balance - Comments: close min guard for safety   Standing balance support: Bilateral upper extremity supported Standing balance-Leahy Scale: Poor Standing balance comment: reliant on EVA walker for support                           ADL either performed or assessed with clinical judgement   ADL Overall ADL's : Needs assistance/impaired Eating/Feeding: NPO   Grooming: Min guard Grooming Details (indicate cue type and reason): min guard for safety with lines and tubes, able to bring hand to mouth and use hands to hold objects Upper Body Bathing: Minimal assistance   Lower Body Bathing: Minimal assistance;Sitting/lateral leans   Upper Body Dressing : Moderate assistance;Sitting Upper Body Dressing Details (indicate cue type and reason): line management Lower Body Dressing: Maximal assistance;Sit to/from stand   Toilet Transfer: Moderate assistance;Stand-pivot;RW   Toileting- Clothing Manipulation and Hygiene: Maximal assistance;Sit to/from stand         General ADL Comments: Pt required constant cues throughout the session to keep eyes open.; Pt required lots of assistance for line management and alertness     Vision   Additional Comments: Pt had trouble keeping eyes open throughout session -      Perception     Praxis      Pertinent Vitals/Pain Pain Assessment: Faces Faces Pain Scale: Hurts little more Pain Location: Abdomen when coughing Pain Descriptors / Indicators: Sore Pain Intervention(s): Monitored during session;Repositioned     Hand Dominance     Extremity/Trunk Assessment Upper Extremity Assessment Upper Extremity Assessment: Overall WFL for  tasks assessed   Lower Extremity Assessment Lower Extremity Assessment: Generalized weakness   Cervical / Trunk Assessment Cervical / Trunk Assessment: Kyphotic   Communication  Communication Communication: Expressive difficulties(soft spoken and mumbling)   Cognition Arousal/Alertness: Lethargic;Suspect due to medications Behavior During Therapy: Impulsive Overall Cognitive Status: Impaired/Different from baseline Area of Impairment: Attention;Following commands;Safety/judgement;Awareness;Problem solving                   Current Attention Level: Sustained   Following Commands: Follows one step commands inconsistently Safety/Judgement: Decreased awareness of safety;Decreased awareness of deficits Awareness: Emergent Problem Solving: Slow processing;Decreased initiation;Requires verbal cues General Comments: Pt requiring repeated cues to keep eyes open throughout session and while walking; unsure if due to medication or CIWA. Easily distracted with difficulty multi-tasking, requiring many cues to keep walking forward. Very little safety awareness with walker   General Comments  Niece present throughout session and confirmed Home information as well as stated that he does have family that can be there to assist 24 hours     Exercises     Shoulder Instructions      Home Living Family/patient expects to be discharged to:: Private residence Living Arrangements: Alone Available Help at Discharge: Family;Available PRN/intermittently Type of Home: House Home Access: Stairs to enter [employer] of Steps: 1   Home Layout: One level     Bathroom Shower/Tub: IT trainer: Standard     Home Equipment: None   Additional Comments: Niece present and able to verify home set-up      Prior Functioning/Environment Level of Independence: Independent        Comments: Pt poor historian due to current cognitive status. Pt's niece present and reports indep with all mobility; works as Tour manager Problem List: Decreased activity tolerance;Impaired balance (sitting and/or standing);Decreased safety  awareness;Decreased knowledge of use of DME or AE;Decreased knowledge of precautions;Decreased cognition;Decreased strength      OT Treatment/Interventions: Self-care/ADL training;Therapeutic exercise;Energy conservation;DME and/or AE instruction;Therapeutic activities;Patient/family education;Balance training    OT Goals(Current goals can be found in the care plan section) Acute Rehab OT Goals Patient Stated Goal: Return home OT Goal Formulation: With patient/family Time For Goal Achievement: 06/16/17 Potential to Achieve Goals: Good ADL Goals Pt Will Perform Grooming: with modified independence;standing Pt Will Perform Upper Body Bathing: with modified independence;standing Pt Will Perform Lower Body Bathing: with modified independence;sit to/from stand Pt Will Transfer to Toilet: with modified independence;ambulating Pt Will Perform Toileting - Clothing Manipulation and hygiene: with modified independence;sit to/from stand Additional ADL Goal #1: Pt will perform bed mobility at mod I level prior to engaging in ADL activity  OT Frequency: Min 2X/week   Barriers to D/C:            Co-evaluation              AM-PAC PT "6 Clicks" Daily Activity     Outcome Measure Help from another person eating meals?: Total(NPO) Help from another person taking care of personal grooming?: A Lot Help from another person toileting, which includes using toliet, bedpan, or urinal?: A Lot Help from another person bathing (including washing, rinsing, drying)?: A Lot Help from another person to put on and taking off regular upper body clothing?: A Lot Help from another person to put on and taking off regular lower body clothing?: A Lot 6 Click Score: 11   End of Session Equipment Utilized During Treatment: Gait belt;Rolling walker;Oxygen(3L throughout session)  Nurse Communication: Mobility status  Activity Tolerance: Patient tolerated treatment well Patient left: in chair;with call bell/phone  within reach;with chair alarm set;with family/visitor present  OT Visit Diagnosis: Other abnormalities of gait and mobility (R26.89);Unsteadiness on feet (R26.81);History of falling (Z91.81);Other symptoms and signs involving cognitive function                Time: 1227-1310 OT Time Calculation (min): 43 min Charges:  OT General Charges $OT Visit: 1 Visit OT Evaluation $OT Eval Moderate Complexity: 1 Mod G-Codes:     Sherryl Manges OTR/L [phone]  Evern Bio Nery Frappier 06/02/2017, 3:23 PM

## 2017-06-02 NOTE — Progress Notes (Signed)
Patient's belongings recovered from short stay- now with him in [facility] room 17

## 2017-06-02 NOTE — Evaluation (Signed)
Physical Therapy Evaluation Patient Details Name: Wayne [name] MRN: [id number] DOB: [birth year] Today's Date: 06/02/2017   History of Present Illness  Pt is a 62 y.o. male who presents 06/01/17 with progressive BLE ischemia; now s/p aortofemoral bypass grafting and right femoral endartarectomy. Pt with concerns for aspiration post-op. PMH includes pulmonary HTN, PVD, MI, HTN, COPD, CHF, alcohol abuse, athritis.    Clinical Impression  Pt presents with an overall decrease in functional mobility secondary to above. PTA, pt indep and lives alone. 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 Today, pt able to amb 100' with Carley Hammed walker and min-modA for balance (+2 for lines/chair follow). Pt with lethargy, decreased attention, and very little safety awareness, requiring max cues to keep eyes open and attend to task. If pt's cognition improves, feel his mobility will progress quickly. Currently recommending SNF-level therapies at follow-up; will follow closely to see if pt progresses appropriately to d/c home with HHPT/OT and 24/7 supervision.     Follow Up Recommendations SNF;Supervision/Assistance - 24 hour    Equipment Recommendations  Rolling walker with 5" wheels    Recommendations for Other Services       Precautions / Restrictions Precautions Precautions: Fall Precaution Comments: A-line, central line, NGT Restrictions Weight Bearing Restrictions: No      Mobility  Bed Mobility Overal bed mobility: Needs Assistance Bed Mobility: Supine to Sit     Supine to sit: Min guard;HOB elevated     General bed mobility comments: Assist for lines/leads; cues for sequencing and to attend to task  Transfers Overall transfer level: Needs assistance Equipment used: 4-wheeled walker(Eva walker) Transfers: Sit to/from Stand Sit to Stand: Min assist;+2 safety/equipment         General transfer comment: MinA for balance  and to steady RW upon standing; required repeated cues for safe hand placement when preparing to stand  Ambulation/Gait Ambulation/Gait assistance: Min assist;Mod assist;+2 safety/equipment(+3 for lines and chair follow) Ambulation Distance (Feet): 100 Feet Assistive device: 4-wheeled walker(Eva) Gait Pattern/deviations: Step-to pattern;Drifts right/left;Trunk flexed Gait velocity: Decreased Gait velocity interpretation: <1.8 ft/sec, indicative of risk for recurrent falls General Gait Details: Slow, uncontrolled amb with Alessandra Grout, requiring consistent minA throughout to steady RW, as pt veering R/L with decreased safety awareness to correct. 2x modA to correct posterior LOB. Pt at times putting all weight through BUEs to and swinging feet off floor  Stairs            Wheelchair Mobility    Modified Rankin (Stroke Patients Only)       Balance Overall balance assessment: Needs assistance   Sitting balance-Leahy Scale: Fair       Standing balance-Leahy Scale: Poor                               Pertinent Vitals/Pain Pain Assessment: Faces Faces Pain Scale: Hurts little more Pain Location: Abdomen when coughing Pain Descriptors / Indicators: Sore Pain Intervention(s): Monitored during session;Other (comment)(Encouraged pillow bear hug when coughing)    Home Living Family/patient expects to be discharged to:: Private residence Living Arrangements: Alone Available Help at Discharge: Family;Available PRN/intermittently Type of Home: House         Home Equipment: None Additional Comments: Niece present and able to verify home set-up    Prior Function Level of Independence: Independent         Comments: Pt poor  historian due to current cognitive status. Pt's niece present and reports indep with all mobility; works as Programmer, multimedia        Extremity/Trunk Assessment   Upper Extremity Assessment Upper Extremity Assessment: Overall  WFL for tasks assessed    Lower Extremity Assessment Lower Extremity Assessment: Generalized weakness    Cervical / Trunk Assessment Cervical / Trunk Assessment: Kyphotic  Communication   Communication: Expressive difficulties(soft-spoken, mumbling)  Cognition Arousal/Alertness: Lethargic;Suspect due to medications Behavior During Therapy: Impulsive Overall Cognitive Status: Impaired/Different from baseline Area of Impairment: Attention;Following commands;Safety/judgement;Awareness;Problem solving                   Current Attention Level: Sustained   Following Commands: Follows one step commands inconsistently Safety/Judgement: Decreased awareness of safety;Decreased awareness of deficits Awareness: Emergent Problem Solving: Slow processing;Decreased initiation;Requires verbal cues General Comments: Pt requiring repeated cues to keep eyes open throughout session and while walking; unsure if due to medication or CIWA. Easily distracted with difficulty multi-tasking, requiring many cues to keep walking forward. Very little safety awareness with walker      General Comments General comments (skin integrity, edema, etc.): Niece present during session    Exercises     Assessment/Plan    PT Assessment Patient needs continued PT services  PT Problem List Decreased strength;Decreased activity tolerance;Decreased balance;Decreased mobility;Decreased cognition;Decreased knowledge of use of DME;Decreased safety awareness;Cardiopulmonary status limiting activity       PT Treatment Interventions DME instruction;Gait training;Functional mobility training;Therapeutic activities;Therapeutic exercise;Balance training;Stair training;Patient/family education    PT Goals (Current goals can be found in the Care Plan section)  Acute Rehab PT Goals Patient Stated Goal: Return home PT Goal Formulation: With patient Time For Goal Achievement: 06/16/17 Potential to Achieve Goals: Good     Frequency Min 2X/week   Barriers to discharge Decreased caregiver support      Co-evaluation               AM-PAC PT "6 Clicks" Daily Activity  Outcome Measure Difficulty turning over in bed (including adjusting bedclothes, sheets and blankets)?: A Little Difficulty moving from lying on back to sitting on the side of the bed? : A Little Difficulty sitting down on and standing up from a chair with arms (e.g., wheelchair, bedside commode, etc,.)?: Unable Help needed moving to and from a bed to chair (including a wheelchair)?: A Little Help needed walking in hospital room?: A Little Help needed climbing 3-5 steps with a railing? : A Lot 6 Click Score: 15    End of Session Equipment Utilized During Treatment: Gait belt;Oxygen Activity Tolerance: Patient tolerated treatment well;Patient limited by lethargy Patient left: in chair;with call bell/phone within reach;with chair alarm set;with family/visitor present Nurse Communication: Mobility status PT Visit Diagnosis: Other abnormalities of gait and mobility (R26.89);Muscle weakness (generalized) (M62.81)    Time: 1227-1310 PT Time Calculation (min) (ACUTE ONLY): 43 min   Charges:   PT Evaluation $PT Eval Moderate Complexity: 1 Mod PT Treatments $Gait Training: 8-22 mins   PT G Codes:       Ina Homes, PT, DPT Acute Rehab Services  Pager: [phone]  Malachy Chamber 06/02/2017, 1:53 PM

## 2017-06-02 NOTE — Plan of Care (Signed)
  Problem: Education: Goal: Knowledge of General Education information will improve Outcome: Progressing   Problem: Skin Integrity: Goal: Risk for impaired skin integrity will decrease Outcome: Progressing

## 2017-06-02 NOTE — Consult Note (Signed)
PULMONARY / CRITICAL CARE MEDICINE   Name: Wayne [name] MRN: [id number] DOB: Aug 21, 1955    ADMISSION DATE:  06/01/2017 CONSULTATION DATE:  3/27  REFERRING MD:  Imogene Burn   CHIEF COMPLAINT:  Respiratory failure   HISTORY OF PRESENT ILLNESS:   62 year old male with known history of PVD, chronic systolic heart failure (EF 40-45%), severe pulmonary hypertension, moderate MR, COPD, hypertension, EtOH abuse, medical noncompliance who was admitted 3/26 for planned aortobifemoral graft by vascular surgery.  He was initially scheduled to undergo this procedure on 3/5 but had hospital admission due to COPD exacerbation plus RSV at that time and procedure was delayed.  Postop patient had reported emesis with possible aspiration, altered mental status, hypoxia and PCCM was consulted.  PAST MEDICAL HISTORY :  He  has a past medical history of Abnormal pulmonary finding, Alcohol abuse, Anemia, Arthritis, Chronic systolic CHF (congestive heart failure) (HCC), COPD (chronic obstructive pulmonary disease) (HCC), Dyspnea, GERD (gastroesophageal reflux disease), History of blood transfusion (2004), History of stomach ulcers (2004), Hypercholesterolemia, Hypertension, Malnutrition (HCC), Moderate mitral regurgitation, Myocardial infarction (HCC) (2004), Peripheral vascular disease (HCC), Pre-diabetes, Pulmonary hypertension (HCC), and Tricuspid regurgitation (2017).  PAST SURGICAL HISTORY: He  has a past surgical history that includes ABDOMINAL AORTOGRAM W/LOWER EXTREMITY (N/A, 04/01/2017); Hemorrhoidectomy with hemorrhoid banding; Mass excision (Left, 04/13/2017); Colonoscopy; and PERIPHERAL VASCULAR INTERVENTION (Left).  Allergies  Allergen Reactions  . Penicillins Itching, Rash and Other (See Comments)    PATIENT HAS HAD A PCN REACTION WITH IMMEDIATE RASH, FACIAL/TONGUE/THROAT SWELLING, SOB, OR LIGHTHEADEDNESS WITH HYPOTENSION:  #  #  #  YES  #  #  #   Has patient had a PCN reaction causing severe rash involving  mucus membranes or skin necrosis: No Has patient had a PCN reaction that required hospitalization: No Has patient had a PCN reaction occurring within the last 10 years: No If all of the above answers are "NO", then may proceed with Cephalosporin use.     No current facility-administered medications on file prior to encounter.    Current Outpatient Medications on File Prior to Encounter  Medication Sig  . acetaminophen (TYLENOL) 500 MG tablet Take 2 tablets (1,000 mg total) by mouth every 6 (six) hours as needed. (Patient taking differently: Take 250 mg by mouth daily as needed for moderate pain or headache. )  . albuterol (PROVENTIL HFA;VENTOLIN HFA) 108 (90 Base) MCG/ACT inhaler Inhale 2 puffs into the lungs every 6 (six) hours as needed for wheezing or shortness of breath.  Marland Kitchen aspirin EC 81 MG tablet Take 81 mg by mouth daily.  . budesonide-formoterol (SYMBICORT) 160-4.5 MCG/ACT inhaler Inhale 2 puffs into the lungs 2 (two) times daily.  . folic acid (FOLVITE) 1 MG tablet Take 1 tablet (1 mg total) by mouth daily.  . furosemide (LASIX) 20 MG tablet Take 1 tablet (20 mg total) by mouth daily.  Marland Kitchen omeprazole (PRILOSEC) 20 MG capsule Take 1 capsule (20 mg total) by mouth daily as needed (for indegestion).  . predniSONE (DELTASONE) 20 MG tablet Take in the morning: Take 3 tablets x 3days, then take 2 tablets x 3days, then take 1 tablet x 3days.  Marland Kitchen atorvastatin (LIPITOR) 20 MG tablet Take 20 mg by mouth daily.  Marland Kitchen doxycycline (VIBRAMYCIN) 50 MG capsule Take 1 capsule (50 mg total) by mouth 2 (two) times daily. Completion of hospital course (Patient not taking: Reported on 05/18/2017)  . feeding supplement, ENSURE ENLIVE, (ENSURE ENLIVE) LIQD Take 237 mLs by mouth 2 (two) times daily between  meals. (Patient not taking: Reported on 05/18/2017)  . guaiFENesin (MUCINEX) 600 MG 12 hr tablet Take 1 tablet (600 mg total) by mouth 2 (two) times daily. (Patient not taking: Reported on 05/18/2017)  . Multiple  Vitamin (MULTIVITAMIN WITH MINERALS) TABS tablet Take 1 tablet by mouth daily. (Patient not taking: Reported on 05/18/2017)  . nicotine (NICODERM CQ - DOSED IN MG/24 HOURS) 21 mg/24hr patch Place 1 patch (21 mg total) onto the skin daily. (Patient not taking: Reported on 05/18/2017)  . ondansetron (ZOFRAN ODT) 4 MG disintegrating tablet Take 1 tablet (4 mg total) by mouth every 8 (eight) hours as needed for nausea or vomiting. (Patient taking differently: Take 4 mg by mouth every 8 (eight) hours as needed for nausea or vomiting (before pain medications). )  . thiamine 100 MG tablet Take 1 tablet (100 mg total) by mouth daily.    FAMILY HISTORY:  His indicated that his mother is deceased. He indicated that his father is deceased. He indicated that the status of his sister is unknown.   SOCIAL HISTORY: He  reports that he has been smoking cigarettes.  He has a 12.50 pack-year smoking history. He has never used smokeless tobacco. He reports that he drinks about 4.8 oz of alcohol per week. He reports that he does not use drugs.  REVIEW OF SYSTEMS:   Somewhat confused. Does c/o mild SOB but improved. C/o post op abd soreness. Denies chest pain.   SUBJECTIVE:  O2 needs improving.  On 3 L nasal cannula. Co-ox 55% off milrinone  VITAL SIGNS: BP 138/90   Pulse 82   Temp 98.8 F (37.1 C)   Resp 11   Ht 5\' 3"  (1.6 m)   Wt 53.3 kg (117 lb 8.1 oz)   SpO2 92%   BMI 20.82 kg/m   HEMODYNAMICS: PAP: (40-93)/(21-49) 44/32 CO:  [2.7 L/min-5.4 L/min] 2.7 L/min CI:  [1.8 L/min/m2-3.6 L/min/m2] 1.8 L/min/m2  VENTILATOR SETTINGS:    INTAKE / OUTPUT: I/O last 3 completed shifts: In: 5761.3 [I.V.:4681.3; Blood:180; IV Piggyback:900] Out: 2500 [Urine:1950; Blood:550]  PHYSICAL EXAMINATION: General: Chronically ill-appearing thin male no acute distress in bed Neuro: Drowsy but arousable, slightly confused at times, oriented to self and place, knows he had surgery, moves all extremities HEENT: MM  moist, no JVD, NG tube Cardiovascular: S1-S2 regular rate and rhythm Lungs: Respirations are even and nonlabored on 3 L nasal cannula, few bibasilar crackles, mild expiratory wheeze Abdomen: Soft, tender postop, midline dressing with small stained, marked Musculoskeletal: Warm and dry, no significant edema  LABS:  BMET Recent Labs  Lab 06/01/17 1139 06/01/17 1330 06/02/17 0247  NA 128* 127* 128*  K 4.3 4.4 4.6  CL  --  98* 99*  CO2  --  18* 19*  BUN  --  <5* 5*  CREATININE  --  0.75 0.63  GLUCOSE  --  140* 183*    Electrolytes Recent Labs  Lab 06/01/17 1330 06/02/17 0247  CALCIUM 8.0* 8.1*  MG 1.8 1.7    CBC Recent Labs  Lab 06/01/17 1139 06/01/17 1330 06/02/17 0247  WBC  --  14.8* 14.9*  HGB 12.9* 13.9 12.8*  HCT 38.0* 40.7 37.5*  PLT  --  195 183    Coag's Recent Labs  Lab 06/01/17 1330  APTT 32  INR 1.19    Sepsis Markers No results for input(s): LATICACIDVEN, PROCALCITON, O2SATVEN in the last 168 hours.  ABG Recent Labs  Lab 06/01/17 0802 06/01/17 1139 06/02/17 0312  PHART 7.307*  7.250* 7.387  PCO2ART 41.6 46.9 38.4  PO2ART 190.0* 79.0* 96.2    Liver Enzymes Recent Labs  Lab 06/01/17 1330 06/01/17 1723 06/02/17 0247  AST 23  --  26  ALT 13*  --  15*  ALKPHOS 106  --  92  BILITOT 2.6*  --  1.1  ALBUMIN 3.1* 3.0* 2.7*    Cardiac Enzymes No results for input(s): TROPONINI, PROBNP in the last 168 hours.  Glucose No results for input(s): GLUCAP in the last 168 hours.  Imaging Dg Chest Port 1 View  Result Date: 06/02/2017 CLINICAL DATA:  Post aortobifemoral bypass grafting, smoker, CHF, COPD, hypertension, coronary artery disease post MI EXAM: PORTABLE CHEST 1 VIEW COMPARISON:  Portable exam 0504 hours compared to 06/01/2017 FINDINGS: Tip of Swan-Ganz catheter projects over RIGHT pulmonary artery at pulmonary hilum. Nasogastric tube extends into stomach. Enlargement of cardiac silhouette with pulmonary vascular congestion.  Atherosclerotic calcification aorta. Mild perihilar infiltrates likely minimal pulmonary edema. No pleural effusion or pneumothorax. IMPRESSION: Enlargement of cardiac silhouette with pulmonary vascular congestion and pulmonary edema, slightly increased. Electronically Signed   By: Ulyses Southward M.D.   On: 06/02/2017 09:45   Dg Chest Port 1 View  Result Date: 06/01/2017 CLINICAL DATA:  Aspiration EXAM: PORTABLE CHEST 1 VIEW COMPARISON:  06/01/2017, 05/11/2017 FINDINGS: Esophageal tube tip extends below the diaphragm. Right IJ Swan-Ganz catheter tip advanced, now projects over right inter lobar pulmonary artery. No focal opacity or pleural effusion. Stable slightly enlarged cardiomediastinal silhouette with mild central congestion. Mild diffuse interstitial opacities likely reflect minimal edema. Overall improved since prior study. No pneumothorax. IMPRESSION: 1. Right IJ Swan-Ganz catheter tip advanced and now projects over right inter lobar pulmonary artery 2. Cardiomegaly with vascular congestion and mild diffuse interstitial opacity suspect for minimal edema, overall decreased compared with prior radiograph. Electronically Signed   By: Jasmine Pang M.D.   On: 06/01/2017 20:21   Dg Chest Port 1 View  Result Date: 06/01/2017 CLINICAL DATA:  Post aortobifem bypass graft EXAM: PORTABLE CHEST 1 VIEW COMPARISON:  CT angio chest abdomen pelvis of 05/11/2017 FINDINGS: Very prominent interstitial markings are noted throughout the lungs. Some of these markings may be chronic, but superimposed interstitial edema is a consideration. No definite pleural effusion is noted. The heart is mildly enlarged. Swan-Ganz catheter tip is noted within the left main pulmonary artery. NG tube extends below the hemidiaphragm. IMPRESSION: 1. Very prominent interstitial markings some of which may be chronic, but superimposed interstitial edema cannot be excluded. No definite effusion. 2. Stable cardiomegaly with Swan-Ganz catheter tip  within the left main pulmonary artery. Electronically Signed   By: Dwyane Dee M.D.   On: 06/01/2017 13:51   Dg Abd Portable 1v  Result Date: 06/01/2017 CLINICAL DATA:  NG tube placement EXAM: PORTABLE ABDOMEN - 1 VIEW COMPARISON:  06/01/2017 FINDINGS: Cutaneous abdominal staples. Esophageal tube tip projects over the body of the stomach, side-port over the proximal stomach. Visible lung bases are clear. IMPRESSION: Enteric tube tip projects over the mid to distal stomach. Electronically Signed   By: Jasmine Pang M.D.   On: 06/01/2017 20:20   Dg Abd Portable 1v  Result Date: 06/01/2017 CLINICAL DATA:  Status post aortobifemoral bypass graft. EXAM: PORTABLE ABDOMEN - 1 VIEW COMPARISON:  04/28/2017 CT abdomen/pelvis FINDINGS: Enteric tube terminates in the proximal stomach. Vertical skin staples are noted just to the left of the lumbar spine. Extensive aorto bi-iliac atherosclerotic calcifications are noted with right common iliac stent graft. No disproportionately dilated  small bowel loops. No evidence of pneumatosis or pneumoperitoneum. Mild colonic stool. No radiopaque nephrolithiasis. IMPRESSION: 1. Enteric tube terminates in the proximal stomach. 2. Nonobstructive bowel gas pattern. Electronically Signed   By: Delbert Phenix M.D.   On: 06/01/2017 13:51     STUDIES:    CULTURES:   ANTIBIOTICS:   SIGNIFICANT EVENTS: 3/26>> aortobifemoral bypass  LINES/TUBES: RIJ PA catheter 3/26>>>  DISCUSSION: 62 year old male with history of pulmonary hypertension, systolic heart failure, COPD admitted 3/26 for planned aortobifemoral bypass with postop nausea/vomiting and possible aspiration with worsening hypoxia.  O2 needs are improving slowly.  ASSESSMENT / PLAN:  PULMONARY Acute on chronic hypoxic respiratory failure COPD Possible aspiration Severe pulmonary hypertension P:   Supplemental O2 as needed-wean as able to keep sats greater than 90% Follow-up chest x-ray Diuresis per heart  failure Bronchodilators -  hold home Symbicort for now.  Will add duo nebs, budesonide, as needed albuterol Hold off on antibiotics for now-chest x-ray less concerning for aspiration pneumonia.  O2 needs improving.  Should continue to improve with diuresis Mobilize  CARDIOVASCULAR Acute on chronic systolic heart failure Severe pulmonary hypertension-mean PA pressures in the 50s Hypertension P:  Heart failure team following Follow-up co-ox Diuresis as BP and renal function tolerate Follow-up chest x-ray Monitor off milrinone-may need to resume Likely need sildenafil once able to take p.o.'s PA catheter out  RENAL Hyponatremia-appears somewhat chronic P:   Follow-up chemistry Diuresis as above KVO IV fluids  GASTROINTESTINAL History of EtOH abuse Cirrhosis P:   CIWA protocol Follow-up LFTs N.p.o. for now Thiamine, folate PPI  HEMATOLOGIC No active issue P:  Lovenox Follow-up CBC  INFECTIOUS ? Aspiration P:   Afebrile, O2 needs improving, no obvious infiltrate on chest x-ray Monitor WBC, fever curve off antibiotics Check pro-calcitonin Note penicillin allergy  ENDOCRINE No active issue P:   Monitor glucose on chemistry  NEUROLOGIC Altered mental status-multifactorial.  Improving. History of EtOH abuse- suspect some degree of withdrawal P:   CIWA protocol as above Repeat ABG   FAMILY  - Updates: No family at bedside 3/27.  - Inter-disciplinary family meet or Palliative Care meeting due by: Day 7    Dirk Dress, NP 06/02/2017  11:57 AM Pager: [phone] or [phone]

## 2017-06-02 NOTE — Progress Notes (Signed)
During change of shift pt aspirated vomitus. On assessment HOB was < 30 degrees and pt was slumped in bed with light brown vomitus coming out of mouth and R. Nostril. Pt repositioned to high fowlers position and mouth was suctioned with suction yankauer; pt encouraged to cough to expectorate secretions . RT paged to perform nasotracheal suction with little return of secretions. What returned was bright bloody mucus from trauma during suctioning. Pt experienced desaturations into the 70s and oxygen was increased to 6L. O2 saturations immediately improved. Pt was encouraged to cough but remained somnolent with poor attention/ concentration. Audible rhonchi and expiratory wheezes present on auscultation. MD Myra Gianotti was paged during the event and notified of Pt status. RN received orders to advance NG tube 5cm and a stat KUB for NG placement and portable CXR were ordered.  Family was at the bedside and educated on event. RN will continue to pt's respiratory status.

## 2017-06-02 NOTE — Progress Notes (Addendum)
Advanced Heart Failure Rounding Note  PCP-Cardiologist: No primary care provider on file.   Subjective:    Milrinone started intra-operatively with PA pressures in 90s.   Plan was to continue and adjust medicines, but this was stopped last night.   Pt has NG tube in place. Vomited overnight with increased O2 requirements. Concern for aspiration. KUB unremarkable. CXR pending.  Remains SOB this am. He is alert to person, but not situation. He follows commands.   Creatinine stable at 0.63 this am.  Coox 55.6% off milrinone.   Swan numbers, personally reviewed PAP with dampened waveform. Dr. Gala Romney attempted troubleshooting personally with PA pressures remaining elevated in 80s. Unable to wedge or pull back.  CO 2.7 CI 1.8  Objective:   Weight Range: 117 lb 8.1 oz (53.3 kg) Body mass index is 20.82 kg/m.   Vital Signs:   Temp:  [97 F (36.1 C)-98.9 F (37.2 C)] 98.6 F (37 C) (03/27 0800) Pulse Rate:  [72-90] 85 (03/27 0800) Resp:  [7-22] 15 (03/27 0800) BP: (115-183)/(42-113) 135/77 (03/27 0800) SpO2:  [86 %-98 %] 94 % (03/27 0800) Arterial Line BP: (123-209)/(56-85) 138/64 (03/27 0800) Weight:  [117 lb 8.1 oz (53.3 kg)] 117 lb 8.1 oz (53.3 kg) (03/27 0500)    Weight change: Filed Weights   06/02/17 0500  Weight: 117 lb 8.1 oz (53.3 kg)    Intake/Output:   Intake/Output Summary (Last 24 hours) at 06/02/2017 0839 Last data filed at 06/02/2017 0800 Gross per 24 hour  Intake 5886.25 ml  Output 2370 ml  Net 3516.25 ml     Physical Exam    General:  SOB. Thin. appearing. No resp difficulty HEENT: Normal. +NG with brown output.  Neck: RIJ swan. Supple. JVP ~8-9 cm. Carotids 2+ bilat; no bruits. No lymphadenopathy or thyromegaly appreciated. Cor: PMI nondisplaced. Regular rate & rhythm. Prominent RV lift. 2/6 TR.  Lungs: Diminshed throughout with bibasilar crackles. Abdomen: Soft, nontender, nondistended. No hepatosplenomegaly. No bruits or masses. Good  bowel sounds. Extremities: No cyanosis, clubbing, rash, edema Neuro: Alert to person. Cranial nerves grossly intact. moves all 4 extremities w/o difficulty. Affect pleasant  Telemetry   Personally reviewed, NSR 80-90s  EKG    NSR 84 bpm, personally reviewed  Labs    CBC Recent Labs    06/01/17 1330 06/02/17 0247  WBC 14.8* 14.9*  HGB 13.9 12.8*  HCT 40.7 37.5*  MCV 100.0 98.2  PLT 195 183   Basic Metabolic Panel Recent Labs    16/10/96 1330 06/02/17 0247  NA 127* 128*  K 4.4 4.6  CL 98* 99*  CO2 18* 19*  GLUCOSE 140* 183*  BUN <5* 5*  CREATININE 0.75 0.63  CALCIUM 8.0* 8.1*  MG 1.8 1.7   Liver Function Tests Recent Labs    06/01/17 1330 06/01/17 1723 06/02/17 0247  AST 23  --  26  ALT 13*  --  15*  ALKPHOS 106  --  92  BILITOT 2.6*  --  1.1  PROT 6.1*  --  5.7*  ALBUMIN 3.1* 3.0* 2.7*   Recent Labs    06/02/17 0247  AMYLASE 83   Cardiac Enzymes No results for input(s): CKTOTAL, CKMB, CKMBINDEX, TROPONINI in the last 72 hours.  BNP: BNP (last 3 results) Recent Labs    05/11/17 0808  BNP 1,284.4*    ProBNP (last 3 results) No results for input(s): PROBNP in the last 8760 hours.   D-Dimer No results for input(s): DDIMER in the last 72  hours. Hemoglobin A1C No results for input(s): HGBA1C in the last 72 hours. Fasting Lipid Panel No results for input(s): CHOL, HDL, LDLCALC, TRIG, CHOLHDL, LDLDIRECT in the last 72 hours. Thyroid Function Tests No results for input(s): TSH, T4TOTAL, T3FREE, THYROIDAB in the last 72 hours.  Invalid input(s): FREET3  Other results:   Imaging    Dg Chest Port 1 View  Result Date: 06/01/2017 CLINICAL DATA:  Aspiration EXAM: PORTABLE CHEST 1 VIEW COMPARISON:  06/01/2017, 05/11/2017 FINDINGS: Esophageal tube tip extends below the diaphragm. Right IJ Swan-Ganz catheter tip advanced, now projects over right inter lobar pulmonary artery. No focal opacity or pleural effusion. Stable slightly enlarged  cardiomediastinal silhouette with mild central congestion. Mild diffuse interstitial opacities likely reflect minimal edema. Overall improved since prior study. No pneumothorax. IMPRESSION: 1. Right IJ Swan-Ganz catheter tip advanced and now projects over right inter lobar pulmonary artery 2. Cardiomegaly with vascular congestion and mild diffuse interstitial opacity suspect for minimal edema, overall decreased compared with prior radiograph. Electronically Signed   By: Jasmine Pang M.D.   On: 06/01/2017 20:21   Dg Chest Port 1 View  Result Date: 06/01/2017 CLINICAL DATA:  Post aortobifem bypass graft EXAM: PORTABLE CHEST 1 VIEW COMPARISON:  CT angio chest abdomen pelvis of 05/11/2017 FINDINGS: Very prominent interstitial markings are noted throughout the lungs. Some of these markings may be chronic, but superimposed interstitial edema is a consideration. No definite pleural effusion is noted. The heart is mildly enlarged. Swan-Ganz catheter tip is noted within the left main pulmonary artery. NG tube extends below the hemidiaphragm. IMPRESSION: 1. Very prominent interstitial markings some of which may be chronic, but superimposed interstitial edema cannot be excluded. No definite effusion. 2. Stable cardiomegaly with Swan-Ganz catheter tip within the left main pulmonary artery. Electronically Signed   By: Dwyane Dee M.D.   On: 06/01/2017 13:51   Dg Abd Portable 1v  Result Date: 06/01/2017 CLINICAL DATA:  NG tube placement EXAM: PORTABLE ABDOMEN - 1 VIEW COMPARISON:  06/01/2017 FINDINGS: Cutaneous abdominal staples. Esophageal tube tip projects over the body of the stomach, side-port over the proximal stomach. Visible lung bases are clear. IMPRESSION: Enteric tube tip projects over the mid to distal stomach. Electronically Signed   By: Jasmine Pang M.D.   On: 06/01/2017 20:20   Dg Abd Portable 1v  Result Date: 06/01/2017 CLINICAL DATA:  Status post aortobifemoral bypass graft. EXAM: PORTABLE ABDOMEN -  1 VIEW COMPARISON:  04/28/2017 CT abdomen/pelvis FINDINGS: Enteric tube terminates in the proximal stomach. Vertical skin staples are noted just to the left of the lumbar spine. Extensive aorto bi-iliac atherosclerotic calcifications are noted with right common iliac stent graft. No disproportionately dilated small bowel loops. No evidence of pneumatosis or pneumoperitoneum. Mild colonic stool. No radiopaque nephrolithiasis. IMPRESSION: 1. Enteric tube terminates in the proximal stomach. 2. Nonobstructive bowel gas pattern. Electronically Signed   By: Delbert Phenix M.D.   On: 06/01/2017 13:51      Medications:     Scheduled Medications: . docusate sodium  100 mg Oral Daily  . enoxaparin (LOVENOX) injection  40 mg Subcutaneous Q24H  . folic acid  1 mg Intravenous Daily  . mouth rinse  15 mL Mouth Rinse BID  . pantoprazole  40 mg Oral Daily  . thiamine  100 mg Intravenous Daily     Infusions: . sodium chloride    . dextrose 5 % and 0.9% NaCl 90 mL/hr at 06/02/17 0837  . magnesium sulfate 1 - 4 g bolus  IVPB       PRN Medications:  sodium chloride, acetaminophen **OR** acetaminophen, alum & mag hydroxide-simeth, bisacodyl, guaiFENesin-dextromethorphan, hydrALAZINE, HYDROmorphone (DILAUDID) injection, LORazepam, magnesium sulfate 1 - 4 g bolus IVPB, metoprolol tartrate, ondansetron, phenol, potassium chloride  Patient Profile   Wayne [name] is a 62 y.o. male with multiple medical problems as below. AHF team asked to see by Dr. Richardson Landry due to intra-operative discovery of severe pulmonary HTN.  Assessment/Plan   1. PAD s/p aortobifem bypass grafting POD #1 - Stable per Dr. Imogene Burn.  2. Severe pulmonary HTN with elevated PA pressures by swan - Mean PA pressures in 50s by swan.  - Would plan to start sildenafil but NPO currently.  - Low threshold to restart milrinone.   3. Acute on chronic systolic CHF EF 40-45% - Echo 11/6220 LVEF 20-25% ([facility])  - Myoview June 08/2016 LVEF  40% ([facility]), Of note, no significant reversible ischemia or infarct. - Coox 54%. Milrinone stopped overnight.  - Repeat Echo pending this am.  - + 3.2 L. Will give IV lasix 40 mg x 1 and follow response.   4. COPD with lung mass/pulmonary mycosis - [facility] has consulted CCM. - There is some concern about aspiration. CXR pending.   5. Alcohol abuse - Will need complete cessation. - On CIWA protocol  6. Malnutrition - May benefit from nutrition consult.  7. Essential HTN - Meds as above in setting of managing his CHF and PAH.  Medication concerns reviewed with patient and pharmacy team. Barriers identified: None at this time.   Length of Stay: 1  Luane School  06/02/2017, 8:39 AM  Advanced Heart Failure Team Pager [phone] (M-F; 7a - 4p)  Please contact [facility] for night-coverage after hours (4p -7a ) and weekends on [email or web address]   Agree with above.   He is awake post-op. Mildly delrious at times. Likely aspiration overnight. Milrinone stopped by VVS. Swan waveform damped and unable to pull back from yellow port. I was able to get waveform transiently and PAPs still 80-85. Respiratory status ok   On exam Awake but somewhat delirious RIJ swan  JVP jaw Cor RRR + RV lift Lungs CTA but diminished Ab + surgical dressing. Distended. 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 BP ok. Cardiac output moderately depressed. Will keep off milrinone for now and restart as needed. Will start sildenafil 20 TID when taking pos. Can add NO through [place] if needed. Will give 1 dose IV lasix. Suspect ETOH WD will be an issue post-operatively. Await echo.   PA catheter with poor waveform. Will remove. Leave introducer to follow CVP and co-ox.   CRITICAL CARE Performed by: Arvilla Meres  Total critical care time: 35 minutes  Critical care time was exclusive of separately billable procedures and treating  other patients.  Critical care was necessary to treat or prevent imminent or life-threatening deterioration.  Critical care was time spent personally by me (independent of midlevel providers or residents) on the following activities: development of treatment plan with patient and/or surrogate as well as nursing, discussions with consultants, evaluation of patient's response to treatment, examination of patient, obtaining history from patient or surrogate, ordering and performing treatments and interventions, ordering and review of laboratory studies, ordering and review of radiographic studies, pulse oximetry and re-evaluation of patient's condition.    Arvilla Meres, MD  10:09 AM

## 2017-06-02 NOTE — Progress Notes (Addendum)
   Daily Progress Note   Assessment/Planning:   POD #1 s/p ABF   Neuro: limited confusion, some response appropriate, cont CIWA protocol for EtOH (intraop findings of cirrhosis with ascites)  PULM: possible aspiration, will get PCCM on board in the event pt needs reintubation due to progression, pcxr not much different from yesterday  CV: appreciate Cardio's assistance with Pulm HTN mgmt.  If severity determined preop probably would have not proceed with ABF  GI: keep NGT, KUB unremarkable  FEN: reduce fluid to 90 mL/Hr, hyponatremia: change to D5 NS, suspect intravascular volume depletion +/- EtOH polydipsia  HEME: H/H drift due to resuscitation, intraop blood limited and all surgical incision without active bleeding post-op  ID: daily pCXR, consider empiric abx, will get Pulm's input on best regimen  VASC: R tibial signals greatly augmented, L tibial signals less augmented but present   Subjective  - 1 Day Post-Op   Events overnight: emesis, possible aspiration   Objective   Vitals:   06/02/17 0645 06/02/17 0700 06/02/17 0715 06/02/17 0740  BP: 137/83 131/82 (!) 142/84   Pulse: 82 83 82   Resp: 14 14 12    Temp: 98.4 F (36.9 C) 98.4 F (36.9 C) 98.4 F (36.9 C) 98.6 F (37 C)  TempSrc:    Core  SpO2: 94% 95% 94%   Weight:         Intake/Output Summary (Last 24 hours) at 06/02/2017 0753 Last data filed at 06/02/2017 0700 Gross per 24 hour  Intake 5761.25 ml  Output 2500 ml  Net 3261.25 ml    PULM  B rales  CV  RRR, Milrinone gtt off  GI  soft, hypo BS, appropriately TTP, -G/R, inc bandaged  VASC Dopplerable PT and pero B, B groin inc c/d/i  NEURO Somewhat confused, appropriate responses at time    Laboratory   CBC CBC Latest Ref Rng & Units 06/02/2017 06/01/2017 05/25/2017  WBC 4.0 - 10.5 K/uL 14.9(H) 14.8(H) 8.9  Hemoglobin 13.0 - 17.0 g/dL 12.8(L) 13.9 17.4(H)  Hematocrit 39.0 - 52.0 % 37.5(L) 40.7 50.4  Platelets 150 - 400 K/uL 183 195 187     BMET    Component Value Date/Time   NA 128 (L) 06/02/2017 0247   K 4.6 06/02/2017 0247   CL 99 (L) 06/02/2017 0247   CO2 19 (L) 06/02/2017 0247   GLUCOSE 183 (H) 06/02/2017 0247   BUN 5 (L) 06/02/2017 0247   CREATININE 0.63 06/02/2017 0247   CALCIUM 8.1 (L) 06/02/2017 0247   GFRNONAA >60 06/02/2017 0247   GFRAA >60 06/02/2017 0247     Leonides Sake, MD, FACS [facility] [place] [facility] Office: [phone] Pager: [phone]  06/02/2017, 7:53 AM

## 2017-06-03 ENCOUNTER — Inpatient Hospital Stay (HOSPITAL_COMMUNITY): Payer: Medicaid Other

## 2017-06-03 DIAGNOSIS — I2721 Secondary pulmonary arterial hypertension: Secondary | ICD-10-CM

## 2017-06-03 DIAGNOSIS — I2729 Other secondary pulmonary hypertension: Secondary | ICD-10-CM

## 2017-06-03 DIAGNOSIS — I509 Heart failure, unspecified: Secondary | ICD-10-CM

## 2017-06-03 LAB — COMPREHENSIVE METABOLIC PANEL
ALBUMIN: 2.6 g/dL — AB (ref 3.5–5.0)
ALT: 17 U/L (ref 17–63)
ANION GAP: 11 (ref 5–15)
AST: 31 U/L (ref 15–41)
Alkaline Phosphatase: 103 U/L (ref 38–126)
BILIRUBIN TOTAL: 0.9 mg/dL (ref 0.3–1.2)
BUN: <5 mg/dL — ABNORMAL LOW (ref 6–20)
CHLORIDE: 101 mmol/L (ref 101–111)
CO2: 21 mmol/L — ABNORMAL LOW (ref 22–32)
Calcium: 8.2 mg/dL — ABNORMAL LOW (ref 8.9–10.3)
Creatinine, Ser: 0.67 mg/dL (ref 0.61–1.24)
GFR calc Af Amer: >60 mL/min (ref >60)
GFR calc non Af Amer: >60 mL/min (ref >60)
GLUCOSE: 106 mg/dL — AB (ref 65–99)
POTASSIUM: 3.8 mmol/L (ref 3.5–5.1)
SODIUM: 133 mmol/L — AB (ref 135–145)
TOTAL PROTEIN: 5.6 g/dL — AB (ref 6.5–8.1)

## 2017-06-03 LAB — CBC
HEMATOCRIT: 35.9 % — AB (ref 39.0–52.0)
Hemoglobin: 12.2 g/dL — ABNORMAL LOW (ref 13.0–17.0)
MCH: 33.7 pg (ref 26.0–34.0)
MCHC: 34 g/dL (ref 30.0–36.0)
MCV: 99.2 fL (ref 78.0–100.0)
PLATELETS: 193 10*3/uL (ref 150–400)
RBC: 3.62 MIL/uL — ABNORMAL LOW (ref 4.22–5.81)
RDW: 13.5 % (ref 11.5–15.5)
WBC: 10.9 10*3/uL — AB (ref 4.0–10.5)

## 2017-06-03 LAB — ECHOCARDIOGRAM COMPLETE
HEIGHTINCHES: 63 in
Weight: 1908.3 oz

## 2017-06-03 LAB — COOXEMETRY PANEL
CARBOXYHEMOGLOBIN: 1.3 % (ref 0.5–1.5)
METHEMOGLOBIN: 1.2 % (ref 0.0–1.5)
O2 SAT: 68.7 %
Total hemoglobin: 12.5 g/dL (ref 12.0–16.0)

## 2017-06-03 LAB — PROCALCITONIN: Procalcitonin: 0.19 ng/mL

## 2017-06-03 MED ORDER — HYDRALAZINE HCL 20 MG/ML IJ SOLN
10.0000 mg | Freq: Three times a day (TID) | INTRAMUSCULAR | Status: DC | PRN
  Administered 2017-06-03: 10 mg via INTRAVENOUS
  Filled 2017-06-03: qty 1

## 2017-06-03 MED ORDER — POTASSIUM CHLORIDE 10 MEQ/50ML IV SOLN
10.0000 meq | INTRAVENOUS | Status: AC
  Administered 2017-06-03 (×2): 10 meq via INTRAVENOUS
  Filled 2017-06-03 (×2): qty 50

## 2017-06-03 MED ORDER — IPRATROPIUM-ALBUTEROL 0.5-2.5 (3) MG/3ML IN SOLN
3.0000 mL | Freq: Four times a day (QID) | RESPIRATORY_TRACT | Status: DC
  Administered 2017-06-03 (×3): 3 mL via RESPIRATORY_TRACT
  Filled 2017-06-03 (×3): qty 3

## 2017-06-03 NOTE — Progress Notes (Addendum)
Advanced Heart Failure Rounding Note  PCP-Cardiologist: No primary care provider on file.   Subjective:    CXR this am with slight increase in R basilar atelectasis.   Denies CP or SOB. Awake and alert. Wants NG tube out and to eat. Possibly tomorrow per RN. Nutrition following. Now on Ice chips only, but not po meds.   Creatinine 0.67. Coox pending.   Objective:   Weight Range: 119 lb 4.3 oz (54.1 kg) Body mass index is 21.13 kg/m.   Vital Signs:   Temp:  [97.4 F (36.3 C)-99.2 F (37.3 C)] 98.1 F (36.7 C) (03/28 0735) Pulse Rate:  [82-99] 87 (03/28 0700) Resp:  [11-24] 14 (03/28 0700) BP: (123-162)/(70-107) 155/79 (03/28 0700) SpO2:  [91 %-97 %] 94 % (03/28 0700) Arterial Line BP: (139-158)/(56-74) 151/63 (03/27 1600) Weight:  [117 lb 8.1 oz (53.3 kg)-119 lb 4.3 oz (54.1 kg)] 119 lb 4.3 oz (54.1 kg) (03/28 0500) Last BM Date: 05/13/17  Weight change: Filed Weights   06/02/17 0500 06/02/17 0902 06/03/17 0500  Weight: 117 lb 8.1 oz (53.3 kg) 117 lb 8.1 oz (53.3 kg) 119 lb 4.3 oz (54.1 kg)    Intake/Output:   Intake/Output Summary (Last 24 hours) at 06/03/2017 0854 Last data filed at 06/03/2017 0710 Gross per 24 hour  Intake 819.5 ml  Output 1985 ml  Net -1165.5 ml     Physical Exam    General: Thin appearing. NAD.  HEENT: + NG tube anicteric  Neck: Supple. JVP ~8 cm. Carotids 2+ bilat; no bruits. No thyromegaly or nodule noted. Cor: PMI nondisplaced. RRR, Prominent RV lift. 2/6 TR.  Lungs: Diminished throughout with bibasilar crackles.  Abdomen: Would looks ok. Soft, non-tender, non-distended, no HSM. No bruits or masses. Hypoactive bowel sounds.  Extremities: No cyanosis, clubbing, or rash. R and LLE no edema.  Neuro: Alert to person. Cranial nerves grossly intact. moves all 4 extremities w/o difficulty. Affect pleasant   Telemetry   Personally reviewed, NSR 80-90s  EKG    NSR 84 bpm, personally reviewed  Labs    CBC Recent Labs     06/02/17 0247 06/03/17 0333  WBC 14.9* 10.9*  HGB 12.8* 12.2*  HCT 37.5* 35.9*  MCV 98.2 99.2  PLT 183 193   Basic Metabolic Panel Recent Labs    16/10/96 1330 06/02/17 0247 06/03/17 0333  NA 127* 128* 133*  K 4.4 4.6 3.8  CL 98* 99* 101  CO2 18* 19* 21*  GLUCOSE 140* 183* 106*  BUN <5* 5* <5*  CREATININE 0.75 0.63 0.67  CALCIUM 8.0* 8.1* 8.2*  MG 1.8 1.7  --    Liver Function Tests Recent Labs    06/02/17 0247 06/03/17 0333  AST 26 31  ALT 15* 17  ALKPHOS 92 103  BILITOT 1.1 0.9  PROT 5.7* 5.6*  ALBUMIN 2.7* 2.6*   Recent Labs    06/02/17 0247  AMYLASE 83   Cardiac Enzymes No results for input(s): CKTOTAL, CKMB, CKMBINDEX, TROPONINI in the last 72 hours.  BNP: BNP (last 3 results) Recent Labs    05/11/17 0808  BNP 1,284.4*    ProBNP (last 3 results) No results for input(s): PROBNP in the last 8760 hours.   D-Dimer No results for input(s): DDIMER in the last 72 hours. Hemoglobin A1C No results for input(s): HGBA1C in the last 72 hours. Fasting Lipid Panel No results for input(s): CHOL, HDL, LDLCALC, TRIG, CHOLHDL, LDLDIRECT in the last 72 hours. Thyroid Function Tests No results for  input(s): TSH, T4TOTAL, T3FREE, THYROIDAB in the last 72 hours.  Invalid input(s): FREET3  Other results:   Imaging    Dg Chest Portable 1 View  Result Date: 06/03/2017 CLINICAL DATA:  Respiratory failure. EXAM: PORTABLE CHEST 1 VIEW COMPARISON:  06/02/2017 FINDINGS: Cardiomegaly, NG tube entering the stomach with tip off the field of view again noted. Swan-Ganz catheter has been removed with remaining RIGHT IJ central venous catheter sheath. Mild RIGHT basilar atelectasis has slightly increased. There is no evidence of pneumothorax. No other changes identified. IMPRESSION: Swan-Ganz catheter removal and slight increase in RIGHT basilar atelectasis. No other significant change. Electronically Signed   By: Harmon Pier M.D.   On: 06/03/2017 08:18      Medications:     Scheduled Medications: . budesonide (PULMICORT) nebulizer solution  0.5 mg Nebulization BID  . Chlorhexidine Gluconate Cloth  6 each Topical Daily  . docusate sodium  100 mg Oral Daily  . enoxaparin (LOVENOX) injection  40 mg Subcutaneous Q24H  . folic acid  1 mg Intravenous Daily  . mouth rinse  15 mL Mouth Rinse BID  . nicotine  21 mg Transdermal Daily  . pantoprazole (PROTONIX) IV  40 mg Intravenous Q24H  . sodium chloride flush  10-40 mL Intracatheter Q12H  . thiamine  100 mg Intravenous Daily    Infusions: . sodium chloride 10 mL/hr at 06/03/17 0614  . dextrose 5 % and 0.9% NaCl 10 mL/hr at 06/02/17 2000  . magnesium sulfate 1 - 4 g bolus IVPB    . potassium chloride Stopped (06/03/17 0729)    PRN Medications: acetaminophen **OR** acetaminophen, alum & mag hydroxide-simeth, bisacodyl, guaiFENesin-dextromethorphan, hydrALAZINE, HYDROmorphone (DILAUDID) injection, Influenza vac split quadrivalent PF, ipratropium-albuterol, LORazepam, magnesium sulfate 1 - 4 g bolus IVPB, metoprolol tartrate, ondansetron, phenol, pneumococcal 23 valent vaccine, potassium chloride, sodium chloride flush  Patient Profile   Wayne [name] is a 62 y.o. male with multiple medical problems as below. AHF team asked to see by Dr. Richardson Landry due to intra-operative discovery of severe pulmonary HTN.  Assessment/Plan   1. PAD s/p aortobifem bypass grafting POD #1 - Stable per Dr. Imogene Burn.   2. 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  - Mean PA pressures in 50s by swan.  - Plan to start sildenafil once eating.  - Low threshold to restart milrinone.   3. Acute on chronic systolic CHF EF 40-45% - Echo 03/958 LVEF 20-25% ([facility])  - Myoview June 08/2016 LVEF 40% ([facility]), Of note, no significant reversible ischemia or infarct. - Repeat Echo pending.  - Coox pending. Not on milrinone.  - Negative 1.1 L. Weight up 2 lbs.  Volume looks OK.   4. COPD with lung mass/pulmonary mycosis - CCM following.  - CXR this am right slight increase in R basilar atelectasis.  - Bronchodilators added.   5. Alcohol abuse - Will need complete cessation. - On CIWA protocol  6. Malnutrition - Per Primary with abdominal surgery.  7. Essential HTN - Meds as above. Can use IV hydralazine as needed for BP > 160 while NPO.   Medication concerns reviewed with patient and pharmacy team. Barriers identified: None at this time.   Length of Stay: 2  Luane School  06/03/2017, 8:54 AM  Advanced Heart Failure Team Pager [phone] (M-F; 7a - 4p)  Please contact [facility] for night-coverage after hours (4p -7a ) and weekends on [email or web address]  Patient seen and examined  with the above-signed Advanced Practice Provider and/or Housestaff. I personally reviewed laboratory data, imaging studies and relevant notes. I independently examined the patient and formulated the important aspects of the plan. I have edited the note to reflect any of my changes or salient points. I have personally discussed the plan with the patient and/or family.  Recovering slowly post-op. Milrinone now off. Tolerating PAH well. Renal function and BP stable. Echo reviewed personally. LVEF 60-65% RV mild to moderately dilated but normal function. No significant septal flattening. Will consider sildenafil 20 tid when taking po. Will need formal work-up for PAH at some point including VQ scan and PFTs.   Arvilla Meres, MD  5:53 PM

## 2017-06-03 NOTE — Progress Notes (Signed)
  Echocardiogram 2D Echocardiogram has been performed.  Roosvelt Maser F 06/03/2017, 1:22 PM

## 2017-06-03 NOTE — Progress Notes (Addendum)
   Daily Progress Note   Assessment/Planning:   POD #2 s/p ABF   Neuro: improved confusion, still some present, cont CIWA  PULM: stable respiratory status, pCXR looks somewhat better  CV: appreciate Cardio's assistance with Pulm HTN mgmt  GI: keep NGT another day, LFT demonstrate no active liver issues  FEN: IVF off, ice chips to wet mouth, will start TPN if pt does not have return of BF in new 1-2 days  RENAL: diuresis yesterday, Net: -1.1 L, Total: +2.1 L; nutrition labs suggest Protein malnutrition  HEME: no active bleeding  ID: pulm status appears stable, hold off on empiric abx for now  VASC: improved exam today  Overall, watch one more day in ICU for respiratory+EtOH withdrawal concerns   Subjective  - 2 Days Post-Op   No events overnight, SG catheter failure lead to removal   Objective   Vitals:   06/03/17 0400 06/03/17 0500 06/03/17 0600 06/03/17 0700  BP: (!) 149/88 (!) 141/87 (!) 152/78 (!) 155/79  Pulse: 93 90 85 87  Resp: 13 13 13 14   Temp: 99.2 F (37.3 C)     TempSrc: Oral     SpO2: 94% 93% 95% 94%  Weight:  119 lb 4.3 oz (54.1 kg)    Height:         Intake/Output Summary (Last 24 hours) at 06/03/2017 0721 Last data filed at 06/03/2017 0710 Gross per 24 hour  Intake 944.5 ml  Output 2085 ml  Net -1140.5 ml    PULM  BLL rales  CV  RRR  GI  Soft, hypo BS, appropriate TTP, -G/R, NGT in place: 400 cc, inc bandaged without activ bleeding  VASC R DP palpable, L DP faintly palpable, B PT and peroneal signals  NEURO Less confused, moving ext to command    Laboratory   CBC CBC Latest Ref Rng & Units 06/03/2017 06/02/2017 06/01/2017  WBC 4.0 - 10.5 K/uL 10.9(H) 14.9(H) 14.8(H)  Hemoglobin 13.0 - 17.0 g/dL 12.2(L) 12.8(L) 13.9  Hematocrit 39.0 - 52.0 % 35.9(L) 37.5(L) 40.7  Platelets 150 - 400 K/uL 193 183 195    BMET    Component Value Date/Time   NA 133 (L) 06/03/2017 0333   K 3.8 06/03/2017 0333   CL 101 06/03/2017 0333   CO2 21 (L)  06/03/2017 0333   GLUCOSE 106 (H) 06/03/2017 0333   BUN <5 (L) 06/03/2017 0333   CREATININE 0.67 06/03/2017 0333   CALCIUM 8.2 (L) 06/03/2017 0333   GFRNONAA >60 06/03/2017 0333   GFRAA >60 06/03/2017 2707     Leonides Sake, MD, FACS [facility] Office: [phone] Pager: [phone]  06/03/2017, 7:21 AM

## 2017-06-03 NOTE — Progress Notes (Signed)
PULMONARY / CRITICAL CARE MEDICINE   Name: Wayne [name] MRN: [id number] DOB: 1955-06-08    ADMISSION DATE:  06/01/2017 CONSULTATION DATE:  3/27  REFERRING MD:  Imogene Burn   CHIEF COMPLAINT:  Respiratory failure   HISTORY OF PRESENT ILLNESS:   62 year old male with known history of PVD, chronic systolic heart failure (EF 40-45%), severe pulmonary hypertension, moderate MR, COPD, hypertension, EtOH abuse, medical noncompliance who was admitted 3/26 for planned aortobifemoral graft by vascular surgery.  He was initially scheduled to undergo this procedure on 3/5 but had hospital admission due to COPD exacerbation plus RSV at that time and procedure was delayed.  Postop patient had reported emesis with possible aspiration, altered mental status, hypoxia and PCCM was consulted.  SUBJECTIVE:  O2 needs further improved.  94% on 2L [place] now.  No acute events.  More awake this AM.  VITAL SIGNS: BP (!) 155/79   Pulse 87   Temp 98.1 F (36.7 C) (Oral)   Resp 14   Ht 5\' 3"  (1.6 m)   Wt 54.1 kg (119 lb 4.3 oz)   SpO2 94%   BMI 21.13 kg/m   HEMODYNAMICS: PAP: (44-73)/(32-43) 44/32  VENTILATOR SETTINGS:    INTAKE / OUTPUT: I/O last 3 completed shifts: In: 2759.5 [I.V.:2309.5; NG/GT:150; IV Piggyback:300] Out: 3230 [Urine:2830; Emesis/NG output:400]  PHYSICAL EXAMINATION: General: Chronically ill-appearing thin male no acute distress in bed Neuro: A&O x 3, no deficits. HEENT: MM moist, no JVD, NG tube Cardiovascular: S1-S2 regular rate and rhythm Lungs: Respirations are even and nonlabored, CTAB Abdomen: Soft, tender postop, midline dressing with small stained, marked Musculoskeletal: Warm and dry, no significant edema  LABS:  BMET Recent Labs  Lab 06/01/17 1330 06/02/17 0247 06/03/17 0333  NA 127* 128* 133*  K 4.4 4.6 3.8  CL 98* 99* 101  CO2 18* 19* 21*  BUN <5* 5* <5*  CREATININE 0.75 0.63 0.67  GLUCOSE 140* 183* 106*    Electrolytes Recent Labs  Lab 06/01/17 1330  06/02/17 0247 06/03/17 0333  CALCIUM 8.0* 8.1* 8.2*  MG 1.8 1.7  --     CBC Recent Labs  Lab 06/01/17 1330 06/02/17 0247 06/03/17 0333  WBC 14.8* 14.9* 10.9*  HGB 13.9 12.8* 12.2*  HCT 40.7 37.5* 35.9*  PLT 195 183 193    Coag's Recent Labs  Lab 06/01/17 1330  APTT 32  INR 1.19    Sepsis Markers Recent Labs  Lab 06/02/17 1327 06/03/17 0333  PROCALCITON 0.26 0.19    ABG Recent Labs  Lab 06/01/17 0802 06/01/17 1139 06/02/17 0312  PHART 7.307* 7.250* 7.387  PCO2ART 41.6 46.9 38.4  PO2ART 190.0* 79.0* 96.2    Liver Enzymes Recent Labs  Lab 06/01/17 1330 06/01/17 1723 06/02/17 0247 06/03/17 0333  AST 23  --  26 31  ALT 13*  --  15* 17  ALKPHOS 106  --  92 103  BILITOT 2.6*  --  1.1 0.9  ALBUMIN 3.1* 3.0* 2.7* 2.6*    Cardiac Enzymes No results for input(s): TROPONINI, PROBNP in the last 168 hours.  Glucose No results for input(s): GLUCAP in the last 168 hours.  Imaging Dg Chest Portable 1 View  Result Date: 06/03/2017 CLINICAL DATA:  Respiratory failure. EXAM: PORTABLE CHEST 1 VIEW COMPARISON:  06/02/2017 FINDINGS: Cardiomegaly, NG tube entering the stomach with tip off the field of view again noted. Swan-Ganz catheter has been removed with remaining RIGHT IJ central venous catheter sheath. Mild RIGHT basilar atelectasis has slightly increased. There is no  evidence of pneumothorax. No other changes identified. IMPRESSION: Swan-Ganz catheter removal and slight increase in RIGHT basilar atelectasis. No other significant change. Electronically Signed   By: Harmon Pier M.D.   On: 06/03/2017 08:18     STUDIES:  CXR 3/28 > right basilar atx  CULTURES:   ANTIBIOTICS:   SIGNIFICANT EVENTS: 3/26>> aortobifemoral bypass  LINES/TUBES: RIJ PA catheter 3/26>>> 3/27  DISCUSSION: 62 year old male with history of pulmonary hypertension, systolic heart failure, COPD admitted 3/26 for planned aortobifemoral bypass with postop nausea/vomiting and  possible aspiration with worsening hypoxia.  O2 needs are improving slowly.  ASSESSMENT / PLAN:  PULMONARY Acute on chronic hypoxic respiratory failure COPD Possible aspiration - not impressed, favor atelectasis Severe pulmonary hypertension P:   Supplemental O2 as needed-wean as able to keep sats greater than 90% Diuresis per heart failure Bronchodilators -  hold home Symbicort for now.  Will add duo nebs, budesonide, as needed albuterol Hold off on antibiotics for now-chest x-ray less concerning for aspiration pneumonia.  O2 needs improving.  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 Aspiration - not impressed (Afebrile, O2 needs improving, no obvious infiltrate on chest x-ray).  Favor atelectasis P:   Monitor WBC, fever curve off antibiotics  ENDOCRINE No active issue P:   Monitor glucose on chemistry  NEUROLOGIC Altered mental status-multifactorial.  Improving. History of EtOH abuse- suspect some degree of withdrawal P:   CIWA protocol as above Repeat ABG   FAMILY  - Updates: No family at bedside 3/27.  - Inter-disciplinary family meet or Palliative Care meeting due by: Day 7   Nothing further to add.  PCCM will sign off.  Please do not hesitate to call us back if we can be of any further assistance.    Rutherford Guys, [place] [facility] Pager: [phone]  or [phone] 06/03/2017, 8:55 AM

## 2017-06-04 ENCOUNTER — Inpatient Hospital Stay (HOSPITAL_COMMUNITY): Payer: Medicaid Other

## 2017-06-04 DIAGNOSIS — M79609 Pain in unspecified limb: Secondary | ICD-10-CM

## 2017-06-04 LAB — BASIC METABOLIC PANEL
Anion gap: 10 (ref 5–15)
BUN: 5 mg/dL — AB (ref 6–20)
CO2: 22 mmol/L (ref 22–32)
CREATININE: 0.74 mg/dL (ref 0.61–1.24)
Calcium: 8.1 mg/dL — ABNORMAL LOW (ref 8.9–10.3)
Chloride: 101 mmol/L (ref 101–111)
GFR calc Af Amer: >60 mL/min (ref >60)
GFR calc non Af Amer: >60 mL/min (ref >60)
Glucose, Bld: 74 mg/dL (ref 65–99)
POTASSIUM: 3.3 mmol/L — AB (ref 3.5–5.1)
SODIUM: 133 mmol/L — AB (ref 135–145)

## 2017-06-04 LAB — TYPE AND SCREEN
ABO/RH(D): A POS
ANTIBODY SCREEN: NEGATIVE
UNIT DIVISION: 0
UNIT DIVISION: 0
Unit division: 0
Unit division: 0

## 2017-06-04 LAB — BPAM RBC
BLOOD PRODUCT EXPIRATION DATE: 201904032359
Blood Product Expiration Date: 201904032359
Blood Product Expiration Date: 201904062359
Blood Product Expiration Date: 201904062359
ISSUE DATE / TIME: 201903260705
ISSUE DATE / TIME: 201903281538
ISSUE DATE / TIME: 201903260705
ISSUE DATE / TIME: 201903260705
Unit Type and Rh: 6200
Unit Type and Rh: 6200
Unit Type and Rh: 6200
Unit Type and Rh: 6200

## 2017-06-04 LAB — CBC
HEMATOCRIT: 38 % — AB (ref 39.0–52.0)
Hemoglobin: 12.7 g/dL — ABNORMAL LOW (ref 13.0–17.0)
MCH: 33.2 pg (ref 26.0–34.0)
MCHC: 33.4 g/dL (ref 30.0–36.0)
MCV: 99.5 fL (ref 78.0–100.0)
Platelets: 213 10*3/uL (ref 150–400)
RBC: 3.82 MIL/uL — AB (ref 4.22–5.81)
RDW: 13.5 % (ref 11.5–15.5)
WBC: 12.9 10*3/uL — ABNORMAL HIGH (ref 4.0–10.5)

## 2017-06-04 MED ORDER — FOLIC ACID 1 MG PO TABS
1.0000 mg | ORAL_TABLET | Freq: Every day | ORAL | Status: DC
  Administered 2017-06-04 – 2017-06-07 (×4): 1 mg via ORAL
  Filled 2017-06-04 (×4): qty 1

## 2017-06-04 MED ORDER — MOMETASONE FURO-FORMOTEROL FUM 200-5 MCG/ACT IN AERO
2.0000 | INHALATION_SPRAY | Freq: Two times a day (BID) | RESPIRATORY_TRACT | Status: DC
  Administered 2017-06-04 – 2017-06-07 (×6): 2 via RESPIRATORY_TRACT
  Filled 2017-06-04: qty 8.8

## 2017-06-04 MED ORDER — METOPROLOL TARTRATE 12.5 MG HALF TABLET
12.5000 mg | ORAL_TABLET | Freq: Four times a day (QID) | ORAL | Status: DC
  Administered 2017-06-04 – 2017-06-07 (×11): 12.5 mg via ORAL
  Filled 2017-06-04 (×11): qty 1

## 2017-06-04 MED ORDER — IPRATROPIUM-ALBUTEROL 0.5-2.5 (3) MG/3ML IN SOLN
3.0000 mL | Freq: Three times a day (TID) | RESPIRATORY_TRACT | Status: DC
  Administered 2017-06-04 – 2017-06-07 (×9): 3 mL via RESPIRATORY_TRACT
  Filled 2017-06-04 (×9): qty 3

## 2017-06-04 MED ORDER — ATORVASTATIN CALCIUM 20 MG PO TABS
20.0000 mg | ORAL_TABLET | Freq: Every day | ORAL | Status: DC
  Administered 2017-06-04 – 2017-06-07 (×4): 20 mg via ORAL
  Filled 2017-06-04 (×4): qty 1

## 2017-06-04 MED ORDER — FUROSEMIDE 20 MG PO TABS
20.0000 mg | ORAL_TABLET | Freq: Every day | ORAL | Status: DC
  Administered 2017-06-04 – 2017-06-07 (×4): 20 mg via ORAL
  Filled 2017-06-04 (×4): qty 1

## 2017-06-04 MED ORDER — POTASSIUM CHLORIDE 10 MEQ/50ML IV SOLN
10.0000 meq | INTRAVENOUS | Status: AC
  Administered 2017-06-04 (×4): 10 meq via INTRAVENOUS
  Filled 2017-06-04 (×4): qty 50

## 2017-06-04 MED ORDER — SILDENAFIL CITRATE 20 MG PO TABS
20.0000 mg | ORAL_TABLET | Freq: Three times a day (TID) | ORAL | Status: DC
  Administered 2017-06-04 – 2017-06-07 (×10): 20 mg via ORAL
  Filled 2017-06-04 (×12): qty 1

## 2017-06-04 MED ORDER — METOPROLOL TARTRATE 5 MG/5ML IV SOLN
5.0000 mg | Freq: Once | INTRAVENOUS | Status: AC
  Administered 2017-06-04: 5 mg via INTRAVENOUS

## 2017-06-04 MED ORDER — ALBUTEROL SULFATE (2.5 MG/3ML) 0.083% IN NEBU: 2.5000 mg | INHALATION_SOLUTION | Freq: Four times a day (QID) | RESPIRATORY_TRACT | Status: DC | PRN

## 2017-06-04 MED ORDER — PANTOPRAZOLE SODIUM 40 MG PO TBEC
40.0000 mg | DELAYED_RELEASE_TABLET | Freq: Every day | ORAL | Status: DC
  Administered 2017-06-04 – 2017-06-07 (×4): 40 mg via ORAL
  Filled 2017-06-04 (×4): qty 1

## 2017-06-04 MED ORDER — VITAMIN B-1 100 MG PO TABS
100.0000 mg | ORAL_TABLET | Freq: Every day | ORAL | Status: DC
  Administered 2017-06-04 – 2017-06-07 (×4): 100 mg via ORAL
  Filled 2017-06-04 (×4): qty 1

## 2017-06-04 MED ORDER — ASPIRIN EC 81 MG PO TBEC
81.0000 mg | DELAYED_RELEASE_TABLET | Freq: Every day | ORAL | Status: DC
  Administered 2017-06-04 – 2017-06-07 (×4): 81 mg via ORAL
  Filled 2017-06-04 (×4): qty 1

## 2017-06-04 NOTE — Progress Notes (Addendum)
Advanced Heart Failure Rounding Note  PCP-Cardiologist: No primary care provider on file.   Subjective:    CXR 06/03/17 with slight increase in R basilar atelectasis.   Awake and alert this am. Advanced to clear liquids. NG tube appears to have minimal output. SOB improving.   Creatinine 0.74. Coox has been stable.  Asking for a soda. No CP. BP stable.   Objective:   Weight Range: 110 lb 7.2 oz (50.1 kg) Body mass index is 19.57 kg/m.   Vital Signs:   Temp:  [97.7 F (36.5 C)-99.1 F (37.3 C)] 97.7 F (36.5 C) (03/29 0747) Pulse Rate:  [88-126] 96 (03/29 0738) Resp:  [11-23] 19 (03/29 0738) BP: (141-171)/(72-104) 158/87 (03/29 0738) SpO2:  [85 %-98 %] 98 % (03/29 0738) FiO2 (%):  [28 %] 28 % (03/28 1601) Weight:  [110 lb 7.2 oz (50.1 kg)] 110 lb 7.2 oz (50.1 kg) (03/29 0530) Last BM Date: 05/13/17  Weight change: Filed Weights   06/02/17 0902 06/03/17 0500 06/04/17 0530  Weight: 117 lb 8.1 oz (53.3 kg) 119 lb 4.3 oz (54.1 kg) 110 lb 7.2 oz (50.1 kg)    Intake/Output:   Intake/Output Summary (Last 24 hours) at 06/04/2017 0755 Last data filed at 06/04/2017 0700 Gross per 24 hour  Intake 580 ml  Output 995 ml  Net -415 ml     Physical Exam    General: Thin appearing. NAD.  HEENT: +NG tube. Anicteric. Neck: Supple. JVP ~6-7 cm. Carotids 2+ bilat; no bruits. No thyromegaly or nodule noted. RIJ introducer Cor: PMI nondisplaced. RRR, Prominent RV lift. 2/6 TR.  Lungs: Diminished throughout with bibasilar crackles. No wheezes  Abdomen: Soft, non-tender, non-distended, no HSM. No bruits or masses. +BS - Wound looks OK. Extremities: No cyanosis, clubbing, or rash. R and LLE no edema.  Neuro: Alert & orientedx3, cranial nerves grossly intact. moves all 4 extremities w/o difficulty. Affect pleasant   Telemetry   NSR 90s, personally reviewed  EKG    No new tracings.  Labs    CBC Recent Labs    06/03/17 0333 06/04/17 0336  WBC 10.9* 12.9*  HGB 12.2*  12.7*  HCT 35.9* 38.0*  MCV 99.2 99.5  PLT 193 213   Basic Metabolic Panel Recent Labs    16/10/96 1330 06/02/17 0247 06/03/17 0333 06/04/17 0336  NA 127* 128* 133* 133*  K 4.4 4.6 3.8 3.3*  CL 98* 99* 101 101  CO2 18* 19* 21* 22  GLUCOSE 140* 183* 106* 74  BUN <5* 5* <5* 5*  CREATININE 0.75 0.63 0.67 0.74  CALCIUM 8.0* 8.1* 8.2* 8.1*  MG 1.8 1.7  --   --    Liver Function Tests Recent Labs    06/02/17 0247 06/03/17 0333  AST 26 31  ALT 15* 17  ALKPHOS 92 103  BILITOT 1.1 0.9  PROT 5.7* 5.6*  ALBUMIN 2.7* 2.6*   Recent Labs    06/02/17 0247  AMYLASE 83   Cardiac Enzymes No results for input(s): CKTOTAL, CKMB, CKMBINDEX, TROPONINI in the last 72 hours.  BNP: BNP (last 3 results) Recent Labs    05/11/17 0808  BNP 1,284.4*    ProBNP (last 3 results) No results for input(s): PROBNP in the last 8760 hours.   D-Dimer No results for input(s): DDIMER in the last 72 hours. Hemoglobin A1C No results for input(s): HGBA1C in the last 72 hours. Fasting Lipid Panel No results for input(s): CHOL, HDL, LDLCALC, TRIG, CHOLHDL, LDLDIRECT in the last 72  hours. Thyroid Function Tests No results for input(s): TSH, T4TOTAL, T3FREE, THYROIDAB in the last 72 hours.  Invalid input(s): FREET3  Other results:  Imaging   No results found.  Medications:    Scheduled Medications: . aspirin EC  81 mg Oral Daily  . atorvastatin  20 mg Oral Daily  . budesonide (PULMICORT) nebulizer solution  0.5 mg Nebulization BID  . Chlorhexidine Gluconate Cloth  6 each Topical Daily  . docusate sodium  100 mg Oral Daily  . enoxaparin (LOVENOX) injection  40 mg Subcutaneous Q24H  . folic acid  1 mg Oral Daily  . folic acid  1 mg Intravenous Daily  . furosemide  20 mg Oral Daily  . ipratropium-albuterol  3 mL Nebulization TID  . mouth rinse  15 mL Mouth Rinse BID  . mometasone-formoterol  2 puff Inhalation BID  . nicotine  21 mg Transdermal Daily  . pantoprazole  40 mg Oral  Daily  . pantoprazole (PROTONIX) IV  40 mg Intravenous Q24H  . sodium chloride flush  10-40 mL Intracatheter Q12H  . thiamine  100 mg Intravenous Daily  . thiamine  100 mg Oral Daily    Infusions: . sodium chloride Stopped (06/04/17 0000)  . dextrose 5 % and 0.9% NaCl 10 mL/hr at 06/04/17 0700  . magnesium sulfate 1 - 4 g bolus IVPB      PRN Medications: acetaminophen **OR** acetaminophen, albuterol, alum & mag hydroxide-simeth, bisacodyl, guaiFENesin-dextromethorphan, hydrALAZINE, HYDROmorphone (DILAUDID) injection, Influenza vac split quadrivalent PF, LORazepam, magnesium sulfate 1 - 4 g bolus IVPB, metoprolol tartrate, ondansetron, phenol, pneumococcal 23 valent vaccine, potassium chloride, sodium chloride flush  Patient Profile   Wayne [name] is a 62 y.o. male with multiple medical problems as below. AHF team asked to see by Dr. Richardson Landry due to intra-operative discovery of severe pulmonary HTN.  Assessment/Plan   1. PAD s/p aortobifem bypass grafting POD #1 - Stable per Dr. Imogene Burn.   2. 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  - Mean PA pressures in 50s by swan.  - Start sildenafil 20 mg TID.  - Low threshold to restart milrinone. Coox has been stable. - Will discuss timing of VQ scan and PFTs with MD. Most likely could not adequately perform for PFTs right now with his recent abdominal surgery.   3. Acute on chronic systolic CHF EF 40-45% - Echo 06/2874 LVEF 20-25% (CareEverywhere)  - Echo 06/03/17 LVEF 60-65%, Grade 1 DD, Mild MR, Mod LAE, Mild TR, PA peak pressure 71 mm HG. - Myoview June 08/2016 LVEF 40% (CareEverywhere), Of note, no significant reversible ischemia or infarct. - Coox stable yesterday. Not on milrinone.  - Started on lasix 20 mg daily. Follow.  - Weights inaccurate.   4. COPD with lung mass/pulmonary mycosis - CCM following.  - CXR 06/03/17 right slight increase in R basilar atelectasis.  - Bronchodilators  added.  5. Alcohol abuse - Will need complete cessation.  - On CIWA protocol  6. Malnutrition - Per primary with abdominal surgery.   7. Essential HTN - Meds as above.   Medication concerns reviewed with patient and pharmacy team. Barriers identified: None at this time.   Length of Stay: 3  Luane School  06/04/2017, 7:55 AM  Advanced Heart Failure Team Pager [phone] (M-F; 7a - 4p)  Please contact [facility] for night-coverage after hours (4p -7a ) and weekends on [email or web address]  Patient seen and examined with the  above-signed Engineer, mining. I personally reviewed laboratory data, imaging studies and relevant notes. I independently examined the patient and formulated the important aspects of the plan. I have edited the note to reflect any of my changes or salient points. I have personally discussed the plan with the patient and/or family.  Improving steadily. BP and respiratory status stable. Now taking po. Echo reviewed personally and RV looks quite good despite severe PAH. LV normal. Will start sildenafil. Continue to progress post-op. Will remove introducer. I will see again Monday. Call over the weekend for questions.  Arvilla Meres, MD  1:05 PM

## 2017-06-04 NOTE — Plan of Care (Signed)
Problem: Activity: Goal: Risk for activity intolerance will decrease Outcome: Progressing Note:  Pt up to chair this AM after dangling on side of bed and standing on scale. Needed some encouragement but was amenable to sitting up in chair after some discussion.     Problem: Pain Managment: Goal: General experience of comfort will improve Outcome: Progressing Note:  Reports of pain addressed with PRN medication, pt verbalizes relief from current regimen.     Problem: Bowel/Gastric: Goal: Gastrointestinal status for postoperative course will improve Outcome: Progressing Note:  NG tube to R nare remains intact; to LIS. Faint, hypoactive bowel sounds, + flatus.   Problem: Skin Integrity: Goal: Demonstration of wound healing without infection will improve Outcome: Progressing Note:  Incisions CDI

## 2017-06-04 NOTE — Progress Notes (Signed)
Physical Therapy Treatment Patient Details Name: Wayne [name] MRN: [id number] DOB: 16-Jun-1955 Today's Date: 06/04/2017    History of Present Illness Pt is a 62 y.o. male who presents 06/01/17 with progressive BLE ischemia; now s/p aortofemoral bypass grafting and right femoral endartarectomy. Pt with concerns for aspiration post-op. CXR 3/28 with slight increase in R basilar atelectasis. PMH includes pulmonary HTN, PVD, MI, HTN, COPD, CHF, alcohol abuse, athritis.   PT Comments    Pt slowly progressing with mobility. Able to increased amb distance to 300' with eva walker, but continues to require consistent minA for safety and balance while walking. Improved cognition and alertness this session; still demonstrates decreased attention and safety awareness. Family present throughout session. Increased time spent discussing PT/OT current recommendation for SNF-level therapies; pt adamantly refusing. If to return home, will require 24/7 support, DME, and HH services. Will follow acutely to maximize functional mobility and independence prior d/c.   Follow Up Recommendations  SNF;Supervision/Assistance - 24 hour(pt refusing; will need 24/7 and HHPT/OT services)     Equipment Recommendations  Rolling walker with 5" wheels    Recommendations for Other Services       Precautions / Restrictions Precautions Precautions: Fall Restrictions Weight Bearing Restrictions: No    Mobility  Bed Mobility Overal bed mobility: Needs Assistance Bed Mobility: Supine to Sit     Supine to sit: Supervision        Transfers Overall transfer level: Needs assistance Equipment used: 4-wheeled walker(Eva) Transfers: Sit to/from Stand Sit to Stand: Min assist         General transfer comment: MinA to steady RW; pt slightly impulsive with movement  Ambulation/Gait Ambulation/Gait assistance: Min assist(+2 chair follow that wasn't needed) Ambulation Distance (Feet): 300 Feet Assistive device:  4-wheeled walker(Eva) Gait Pattern/deviations: Step-to pattern;Drifts right/left;Trunk flexed Gait velocity: Decreased Gait velocity interpretation: <1.8 ft/sec, indicative of risk for recurrent falls General Gait Details: Slow, uncontrolled amb with Alessandra Grout, requiring consistent minA throughout to steady RW, as pt veering R/L attempting to look behind him/in multiple directions with decreased safety awareness to correct.    Stairs            Wheelchair Mobility    Modified Rankin (Stroke Patients Only)       Balance Overall balance assessment: Needs assistance Sitting-balance support: Bilateral upper extremity supported;Feet supported Sitting balance-Leahy Scale: Fair Sitting balance - Comments: close min guard for safety   Standing balance support: Bilateral upper extremity supported Standing balance-Leahy Scale: Poor Standing balance comment: Reliant on BUE support                            Cognition Arousal/Alertness: Awake/alert Behavior During Therapy: Flat affect Overall Cognitive Status: Impaired/Different from baseline Area of Impairment: Attention;Following commands;Safety/judgement;Awareness;Problem solving                   Current Attention Level: Sustained   Following Commands: Follows multi-step commands inconsistently Safety/Judgement: Decreased awareness of safety;Decreased awareness of deficits Awareness: Emergent Problem Solving: Requires verbal cues;Slow processing General Comments: Pt with improved alertness and cognition overall. Still demonstrates decreased awareness of safety and current deficits. Adamantly refusing SNF although clearly unsafe to return home without 24/7 support at this point      Exercises      General Comments General comments (skin integrity, edema, etc.): Sister and nieces present throughout session      Pertinent Vitals/Pain Pain Assessment: No/denies pain    Home  Living                       Prior Function            PT Goals (current goals can now be found in the care plan section) Acute Rehab PT Goals Patient Stated Goal: Return home PT Goal Formulation: With patient Time For Goal Achievement: 06/16/17 Potential to Achieve Goals: Good Progress towards PT goals: Progressing toward goals    Frequency    Min 3X/week      PT Plan Frequency needs to be updated    Co-evaluation              AM-PAC PT "6 Clicks" Daily Activity  Outcome Measure  Difficulty turning over in bed (including adjusting bedclothes, sheets and blankets)?: A Little Difficulty moving from lying on back to sitting on the side of the bed? : A Little Difficulty sitting down on and standing up from a chair with arms (e.g., wheelchair, bedside commode, etc,.)?: Unable Help needed moving to and from a bed to chair (including a wheelchair)?: A Little Help needed walking in hospital room?: A Little Help needed climbing 3-5 steps with a railing? : A Lot 6 Click Score: 15    End of Session Equipment Utilized During Treatment: Gait belt;Oxygen Activity Tolerance: Patient tolerated treatment well Patient left: in bed;with call bell/phone within reach;with family/visitor present Nurse Communication: Mobility status PT Visit Diagnosis: Pain     Time: 3790-2409 PT Time Calculation (min) (ACUTE ONLY): 39 min  Charges:  $Gait Training: 8-22 mins $Therapeutic Activity: 8-22 mins $Self Care/Home Management: 8-22                    G Codes:      Ina Homes, PT, DPT Acute Rehab Services  Pager: [phone]  Malachy Chamber 06/04/2017, 3:47 PM

## 2017-06-04 NOTE — Progress Notes (Signed)
ABI: Right 0.75 Left 0.62 Farrel Demark, RDMS, RVT

## 2017-06-05 MED ORDER — SODIUM CHLORIDE 0.9% FLUSH: 3.0000 mL | INTRAVENOUS | Status: DC | PRN

## 2017-06-05 MED ORDER — SODIUM CHLORIDE 0.9% FLUSH
3.0000 mL | Freq: Two times a day (BID) | INTRAVENOUS | Status: DC
  Administered 2017-06-05 – 2017-06-06 (×2): 3 mL via INTRAVENOUS

## 2017-06-05 NOTE — Progress Notes (Signed)
  Progress Note    06/05/2017 10:53 AM 4 Days Post-Op  Subjective: Tolerating soft food with some drainage in his left groin as his only complaint.  States that he had a bowel movement this morning  Vitals:   06/05/17 0730 06/05/17 0823  BP:  122/63  Pulse:  80  Resp:  17  Temp: 97.8 F (36.6 C)   SpO2:  95%    Physical Exam: Awake alert and oriented sitting in chair Nonlabored respirations Abdomen is soft midline incision with staples Bilateral groins are soft, there is minimal serous oozing on the left groin dressing but the incision appears intact There is strong signals at the level of the feet of the posterior tibial and dorsalis pedis arteries  CBC    Component Value Date/Time   WBC 12.9 (H) 06/04/2017 0336   RBC 3.82 (L) 06/04/2017 0336   HGB 12.7 (L) 06/04/2017 0336   HCT 38.0 (L) 06/04/2017 0336   PLT 213 06/04/2017 0336   MCV 99.5 06/04/2017 0336   MCH 33.2 06/04/2017 0336   MCHC 33.4 06/04/2017 0336   RDW 13.5 06/04/2017 0336   LYMPHSABS 0.5 (L) 05/11/2017 0808   MONOABS 0.8 05/11/2017 0808   EOSABS 0.0 05/11/2017 0808   BASOSABS 0.0 05/11/2017 0808    BMET    Component Value Date/Time   NA 133 (L) 06/04/2017 0336   K 3.3 (L) 06/04/2017 0336   CL 101 06/04/2017 0336   CO2 22 06/04/2017 0336   GLUCOSE 74 06/04/2017 0336   BUN 5 (L) 06/04/2017 0336   CREATININE 0.74 06/04/2017 0336   CALCIUM 8.1 (L) 06/04/2017 0336   GFRNONAA >60 06/04/2017 0336   GFRAA >60 06/04/2017 0336    INR    Component Value Date/Time   INR 1.19 06/01/2017 1330     Intake/Output Summary (Last 24 hours) at 06/05/2017 1053 Last data filed at 06/05/2017 0500 Gross per 24 hour  Intake 1290 ml  Output 950 ml  Net 340 ml     Assessment:  61 y.o. male is s/p aortobifemoral bypass and was in A. fib with RVR last night now converted.  Does have minimal oozing from his left groin.  Plan: We will Reevaluate Groin Later Possibly place prevena VAC Advance regular  diet Check labs in the a.m. He has orders for transfer Lovenox for DVT prophylaxis   Rylon Poitra C. Randie Heinz, MD [facility] Office: [phone] Pager: [phone]  06/05/2017 10:53 AM

## 2017-06-05 NOTE — Progress Notes (Signed)
Pt refuses lab to draw AM labs. Talked with PT about importance of drawing lab work and still refusing after education.

## 2017-06-05 NOTE — Progress Notes (Signed)
Pt went into afib RVR around 2030, he was asymptomatic. Cards called and they ordered 5mg  metoprolol IV and started PO metoprolol as well. Pt converted back to SR around 2240.

## 2017-06-06 LAB — CBC
HCT: 37.3 % — ABNORMAL LOW (ref 39.0–52.0)
HEMOGLOBIN: 12.6 g/dL — AB (ref 13.0–17.0)
MCH: 33.3 pg (ref 26.0–34.0)
MCHC: 33.8 g/dL (ref 30.0–36.0)
MCV: 98.7 fL (ref 78.0–100.0)
PLATELETS: 272 10*3/uL (ref 150–400)
RBC: 3.78 MIL/uL — AB (ref 4.22–5.81)
RDW: 13.3 % (ref 11.5–15.5)
WBC: 7 10*3/uL (ref 4.0–10.5)

## 2017-06-06 LAB — BASIC METABOLIC PANEL
ANION GAP: 8 (ref 5–15)
BUN: <5 mg/dL — ABNORMAL LOW (ref 6–20)
CALCIUM: 8 mg/dL — AB (ref 8.9–10.3)
CO2: 24 mmol/L (ref 22–32)
CREATININE: 0.56 mg/dL — AB (ref 0.61–1.24)
Chloride: 100 mmol/L — ABNORMAL LOW (ref 101–111)
GFR calc Af Amer: >60 mL/min (ref >60)
Glucose, Bld: 122 mg/dL — ABNORMAL HIGH (ref 65–99)
Potassium: 3.3 mmol/L — ABNORMAL LOW (ref 3.5–5.1)
SODIUM: 132 mmol/L — AB (ref 135–145)

## 2017-06-06 MED ORDER — POTASSIUM CHLORIDE CRYS ER 20 MEQ PO TBCR
20.0000 meq | EXTENDED_RELEASE_TABLET | ORAL | Status: DC | PRN
  Administered 2017-06-06: 20 meq via ORAL
  Filled 2017-06-06: qty 1

## 2017-06-06 MED ORDER — POTASSIUM CHLORIDE CRYS ER 20 MEQ PO TBCR
20.0000 meq | EXTENDED_RELEASE_TABLET | ORAL | Status: AC
  Administered 2017-06-06 (×3): 20 meq via ORAL
  Filled 2017-06-06 (×3): qty 1

## 2017-06-06 MED ORDER — MENTHOL 3 MG MT LOZG
1.0000 | LOZENGE | OROMUCOSAL | Status: DC | PRN
  Administered 2017-06-06 – 2017-06-07 (×5): 3 mg via ORAL
  Filled 2017-06-06: qty 9

## 2017-06-06 NOTE — Progress Notes (Signed)
Patient assisted to bathroom x 1 via this nurse. Attempted to ambulate patient in hallway x 3 with encouragement from family. Patient refused ambulation x 3.

## 2017-06-06 NOTE — Progress Notes (Signed)
K+= 3.3 and creat= 0.56 w/ urine o/p > 30cc/hr; TCTS KCL protocol initiated with KDur 20 mEq PO Q4hrs x 3 doses.

## 2017-06-06 NOTE — Progress Notes (Signed)
Patient arrived to unit at approximately 0955 via 1 person assistance using a wheelchair. Patient in no acute distress. Patient assisted via this nurse to transfer to bed. VS obtained. Patient oriented to room and unit. Call light in place.

## 2017-06-06 NOTE — Plan of Care (Signed)
Pt tolerating progression of activity, ambulating in hallway with out difficulty.   Pt voiding after foley D/C'd without retention difficulties.  Midline abdominal inc. with staples and bil. Groin inc without s/s of infection. Left groin inc. Oozing serous fluid without odor or purulence. Dressing changed 3 time per shift.

## 2017-06-06 NOTE — Progress Notes (Signed)
  Progress Note    06/06/2017 12:38 PM 5 Days Post-Op  Subjective: Positive flatus, tolerating food without nausea vomiting  Vitals:   06/06/17 0835 06/06/17 1001  BP:  120/65  Pulse:  80  Resp:    Temp: (!) 97.4 F (36.3 C) 98.1 F (36.7 C)  SpO2:  94%    Physical Exam: He is awake alert and oriented now on 4 E. Nonlabored respirations His abdomen is soft with staples in midline incision Bilateral groins are soft no bruising demonstrated from left groin incision today Both feet are warm  CBC    Component Value Date/Time   WBC 7.0 06/06/2017 0248   RBC 3.78 (L) 06/06/2017 0248   HGB 12.6 (L) 06/06/2017 0248   HCT 37.3 (L) 06/06/2017 0248   PLT 272 06/06/2017 0248   MCV 98.7 06/06/2017 0248   MCH 33.3 06/06/2017 0248   MCHC 33.8 06/06/2017 0248   RDW 13.3 06/06/2017 0248   LYMPHSABS 0.5 (L) 05/11/2017 0808   MONOABS 0.8 05/11/2017 0808   EOSABS 0.0 05/11/2017 0808   BASOSABS 0.0 05/11/2017 0808    BMET    Component Value Date/Time   NA 132 (L) 06/06/2017 0248   K 3.3 (L) 06/06/2017 0248   CL 100 (L) 06/06/2017 0248   CO2 24 06/06/2017 0248   GLUCOSE 122 (H) 06/06/2017 0248   BUN <5 (L) 06/06/2017 0248   CREATININE 0.56 (L) 06/06/2017 0248   CALCIUM 8.0 (L) 06/06/2017 0248   GFRNONAA >60 06/06/2017 0248   GFRAA >60 06/06/2017 0248    INR    Component Value Date/Time   INR 1.19 06/01/2017 1330     Intake/Output Summary (Last 24 hours) at 06/06/2017 1238 Last data filed at 06/06/2017 1141 Gross per 24 hour  Intake 360 ml  Output 700 ml  Net -340 ml     Assessment:  62 y.o. male is s/p aortobifemoral bypass  Plan: -Out of bed as tolerated -Diet as tolerated -Patient states that he wants to go home but is being recommended for nursing facility by physical therapy we can have this evaluated tomorrow -DVT prophylaxis: Subcutaneous Lovenox   Azan Maneri C. Randie Heinz, MD [facility] Office: [phone] Pager:  [phone]  06/06/2017 12:38 PM

## 2017-06-07 LAB — MAGNESIUM: MAGNESIUM: 1.8 mg/dL (ref 1.7–2.4)

## 2017-06-07 MED ORDER — SILDENAFIL CITRATE 20 MG PO TABS: 20.0000 mg | ORAL_TABLET | Freq: Three times a day (TID) | ORAL | 0 refills | Status: DC

## 2017-06-07 MED ORDER — IPRATROPIUM-ALBUTEROL 0.5-2.5 (3) MG/3ML IN SOLN: 3.0000 mL | Freq: Four times a day (QID) | RESPIRATORY_TRACT | Status: DC | PRN

## 2017-06-07 MED ORDER — POTASSIUM CHLORIDE CRYS ER 20 MEQ PO TBCR
40.0000 meq | EXTENDED_RELEASE_TABLET | Freq: Once | ORAL | Status: AC
  Administered 2017-06-07: 40 meq via ORAL

## 2017-06-07 MED ORDER — TRAMADOL HCL 50 MG PO TABS: 50.0000 mg | ORAL_TABLET | Freq: Four times a day (QID) | ORAL | 0 refills | Status: DC | PRN

## 2017-06-07 NOTE — Progress Notes (Signed)
CSW acknowledging PT recommendation for SNF; however, patient refusing. No CSW needs.  CSW signing off.  Blenda Nicely, [place] Clinical Social Worker [phone]

## 2017-06-07 NOTE — Care Management Note (Signed)
Case Management Note Donn Pierini RN, BSN Unit 4E-Case Manager [phone]  Patient Details  Name: Wayne [name] MRN: [id number] Date of Birth: December 23, 1955  Subjective/Objective:  Admitted s/p Aortofemoral bypass graft                  Action/Plan: PTA pt lived at home, plan per pt is to stay with sister Wayne [name] post discharge- sister lives in [place]- on [address]- phone # [phone], orders placed for DME RW and HHPT/OT- spoke with pt to offer choice- per pt he would like to use [facility] for services- notified Jermaine with [facility] for RW- which has been delivered to room- call made to Lupita Leash with [facility] for [facility] services referral- referral accepted. Pt has been started on Revatio (sildenafil)- pt has medicaid- and sildenafil generic is on the preferred Medicaid drug list.   Expected Discharge Date:  06/07/17               Expected Discharge Plan:  Home w Home Health Services  In-House Referral:  NA  Discharge planning Services  CM Consult, Medication Assistance  Post Acute Care Choice:  Durable Medical Equipment, Home Health Choice offered to:  Patient  DME Arranged:  Walker rolling DME Agency:  [facility]  HH Arranged:  PT, OT [facility] Agency:  [facility]  Status of Service:  Completed, signed off  If discussed at Long Length of Stay Meetings, dates discussed:    Discharge Disposition: home/home health   Additional Comments:  Darrold Span, RN 06/07/2017, 10:55 AM

## 2017-06-07 NOTE — Discharge Instructions (Signed)
 [facility]  Discharge Instructions   Open Aortic Surgery  Please refer to the following instructions for your post-procedure care. Your surgeon or Physician Assistant will discuss any changes with you.  Activity  Avoid lifting more than eight pounds (a gallon of milk) until after your first post-operative visit. You are encouraged to walk as much as you can. You can slowly return to normal activities but must avoid strenuous activity and heavy lifting until your doctor tells you it's OK. Heavy lifting can hurt the incision and cause a hernia. Avoid activities such as vacuuming or swinging a golf club. It is normal to feel tired for several weeks after your surgery. Do not drive until your doctor gives the OK and you are no longer taking prescription pain medications. It is also normal to have difficulty with sleep habits, eating and bowl movements after surgery. These will go away with time.  Bathing/Showering  You may shower after you go home. Do not soak in a bathtub, hot tub, or swim until the incision heals.  Incision Care  Shower every day. Clean your incision with mild soap and water. Pat the area dry with a clean towel. You do not need a bandage unless otherwise instructed. Do not apply any ointments or creams to your incision. You may have skin glue on your incision. Do not peel it off. It will come off on its own in about one week. If you have staples or sutures along your incision, they will be removed at your post op appointment.  Diet  Resume your normal diet. There are no special food restriction following this procedure. A low fat/low cholesterol diet is recommended for all patients with vascular disease. After your aortic surgery, it's normal to feel full faster than usual and to not feel as hungry as you normally would. You will probably lose weight initially following your surgery. It's best to eat small, frequent meals over the course of  the day. Call the office if you find that you are unable to eat even small meals. In order to heal from your surgery, it is CRITICAL to get adequate nutrition. Your body requires vitamins, minerals, and protein. Vegetables are the best source of vitamins and minerals. causing pain, you may take over-the-counter pain reliever such as acetaminophen (Tylenol). If you were prescribed a stronger pain medication, please be aware these medication can cause nausea and constipation. Prevent nausea by taking the medication with a snack or meal. Avoid constipation by drinking plenty of fluids and eating foods with a high amount of fiber, such as fruits, vegetables and grains. Take 100mg of the over-the-counter stool softener Colace twice a day as needed to help with constipation. A laxative, such as Milk of Magnesia, may be recommended for you at this time. Do not take a laxative unless your surgeon or P.A. tells you it's OK. Do not take Tylenol if you are taking stronger pain medications (such as Percocet).  Follow Up  Our office will schedule a follow up appointment 2-3 weeks after discharge.  Please call us immediately for any of the following conditions    .     Severe or worsening pain in your legs or feet or in your abdomen back or chest. Increased pain, redness drainage (pus) from your incision site. Increased abdominal pain, bloating, nausea, vomiting, or persistent diarrhea. Fever of 101 degrees or higher. Swelling in your leg (s).  Reduce your risk of vascular disease  Stop smoking.   If you would like help, call [facility] at 1-800-QUIT-NOW (1-800-784-8669) or [place] at [phone]. Manage your cholesterol Maintain a desired weight Control your diabetes Keep your blood pressure down  If you have any questions please call the office at [phone].   

## 2017-06-07 NOTE — Plan of Care (Signed)
  Problem: Health Behavior/Discharge Planning: Goal: Ability to manage health-related needs will improve Outcome: Progressing   Problem: Clinical Measurements: Goal: Ability to maintain clinical measurements within normal limits will improve Outcome: Progressing   Problem: Elimination: Goal: Will not experience complications related to bowel motility Outcome: Progressing   Problem: Safety: Goal: Ability to remain free from injury will improve Outcome: Progressing

## 2017-06-07 NOTE — Progress Notes (Signed)
   VASCULAR SURGERY ASSESSMENT & PLAN:   6 Days Post-Op s/p: Aortofemoral bypass graft  Tolerating diet.  Moving bowels.  Pain adequately controlled.    Discharge today.  Follow-up with Dr. Leonides Sake in 2 weeks for staple removal   SUBJECTIVE:   Tolerating diet.  Moved his bowels this morning.  Pain well controlled.  PHYSICAL EXAM:   Vitals:   06/06/17 1954 06/06/17 2108 06/07/17 0100 06/07/17 0541  BP: 125/83  116/82 124/77  Pulse: 91 89 92 86  Resp: (!) 27 14 18 19   Temp: 98.1 F (36.7 C)  98.3 F (36.8 C) 97.8 F (36.6 C)  TempSrc: Oral  Oral Oral  SpO2: 96% 94% 96% 98%  Weight:      Height:       Incisions look fine. No significant drainage from left groin this morning. Both feet are warm and well-perfused. Abdomen is soft and nontender with normal pitched bowel sounds.`   LABS:   Lab Results  Component Value Date   WBC 7.0 06/06/2017   HGB 12.6 (L) 06/06/2017   HCT 37.3 (L) 06/06/2017   MCV 98.7 06/06/2017   PLT 272 06/06/2017   Lab Results  Component Value Date   CREATININE 0.56 (L) 06/06/2017   Lab Results  Component Value Date   INR 1.19 06/01/2017    PROBLEM LIST:    Active Problems:   Aortic occlusion (HCC)   PAH (pulmonary artery hypertension) (HCC)   CURRENT MEDS:   . aspirin EC  81 mg Oral Daily  . atorvastatin  20 mg Oral Daily  . budesonide (PULMICORT) nebulizer solution  0.5 mg Nebulization BID  . docusate sodium  100 mg Oral Daily  . enoxaparin (LOVENOX) injection  40 mg Subcutaneous Q24H  . folic acid  1 mg Oral Daily  . furosemide  20 mg Oral Daily  . ipratropium-albuterol  3 mL Nebulization TID  . metoprolol tartrate  12.5 mg Oral Q6H  . mometasone-formoterol  2 puff Inhalation BID  . nicotine  21 mg Transdermal Daily  . pantoprazole  40 mg Oral Daily  . sildenafil  20 mg Oral TID  . sodium chloride flush  3 mL Intravenous Q12H  . thiamine  100 mg Oral Daily    Waverly Ferrari Beeper: [phone] Office:  [phone] 06/07/2017

## 2017-06-07 NOTE — Discharge Summary (Signed)
[facility] Discharge Summary    Wayne [name] 1955-10-05 61 y.o. male  [id number]  Admission Date: 06/01/2017  Discharge Date: 06/07/17  Physician: Fransisco Hertz, MD  Admission Diagnosis: CRITICAL LIMB ISCHEMIA  Discharge Day services:    see progress note 06/07/17 Physical Exam: Vitals:   06/07/17 0100 06/07/17 0541  BP: 116/82 124/77  Pulse: 92 86  Resp: 18 19  Temp: 98.3 F (36.8 C) 97.8 F (36.6 C)  SpO2: 96% 98%     Hospital Course:  The patient was admitted to the hospital and taken to the operating room on 06/01/2017 and underwent: Aortobifemoral bypass and right femoral endarterectomy by Dr. Imogene Burn and Dr. Edilia Bo on 06/01/17    The pt tolerated the procedure well and was transported to the PACU in fair condition. Intraoperatively he was noted to have severe pulmonary hypertension and required milrinone post operatively.  Heart failure service was consulted who recommend to continue sildenafil and lasix at discharge.  He will follow up as an outpatient.  Post operatively patient also experienced AMS, hypoxia, and emesis with question of aspiration.  Critical care team managed bronchodilators throughout hospital stay and patient will be discharged with their recommendations.   The remainder of the hospital course consisted of increasing mobilization and increasing intake of solids without difficulty.  He is now tolerating a regular home diet.  He was evaluated by PT/OT who recommended SNF placement.  Patient is refusing SNF so will need a rolling walker and HH PT/OT at discharge.  Case management was consulted to arrange home health needs.  Patient is feeling fit for discharge home today.  He will follow up in office in about 2 weeks.  He will be prescribed 2-3 days of narcotic pain medication.  Discharge instructions were reviewed with the patient and he voices his understanding.  He will be discharged home with home health in stable condition.  CBC    Component Value  Date/Time   WBC 7.0 06/06/2017 0248   RBC 3.78 (L) 06/06/2017 0248   HGB 12.6 (L) 06/06/2017 0248   HCT 37.3 (L) 06/06/2017 0248   PLT 272 06/06/2017 0248   MCV 98.7 06/06/2017 0248   MCH 33.3 06/06/2017 0248   MCHC 33.8 06/06/2017 0248   RDW 13.3 06/06/2017 0248   LYMPHSABS 0.5 (L) 05/11/2017 0808   MONOABS 0.8 05/11/2017 0808   EOSABS 0.0 05/11/2017 0808   BASOSABS 0.0 05/11/2017 0808    BMET    Component Value Date/Time   NA 132 (L) 06/06/2017 0248   K 3.3 (L) 06/06/2017 0248   CL 100 (L) 06/06/2017 0248   CO2 24 06/06/2017 0248   GLUCOSE 122 (H) 06/06/2017 0248   BUN <5 (L) 06/06/2017 0248   CREATININE 0.56 (L) 06/06/2017 0248   CALCIUM 8.0 (L) 06/06/2017 0248   GFRNONAA >60 06/06/2017 0248   GFRAA >60 06/06/2017 0248       Discharge Diagnosis:  CRITICAL LIMB ISCHEMIA  Secondary Diagnosis: Patient Active Problem List   Diagnosis Date Noted  . PAH (pulmonary artery hypertension) (HCC)   . 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  . Malnutrition of moderate degree 05/12/2017  . COPD with acute exacerbation (HCC) 05/11/2017  . Fever 05/11/2017  . ETOH abuse 05/11/2017  . Anemia   . COPD (chronic obstructive pulmonary disease) (HCC)   . Hypertension   . Critical lower limb ischemia 04/01/2017  . Peripheral vascular disease (HCC) 03/22/2017  . Simple chronic bronchitis (HCC) 03/22/2017  . 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 Degenerative disc disease, lumbar 12/11/2016  . Primary osteoarthritis of right hip 12/11/2016  . Hypokalemia 01/19/2016  . Chronic systolic heart failure (HCC) 01/18/2016  . Acute respiratory failure with hypoxia (HCC) 01/18/2016  . Bullous lesion 01/18/2016  . Drinks beer 01/18/2016  . Hyperlipidemia 01/18/2016  . Hyponatremia 01/18/2016  . Tobacco abuse 01/18/2016   Past Medical History:  Diagnosis Date  . Abnormal pulmonary finding    a. preadmission note indicates pt followed by Dr. Bethanie Dicker for CHF, lung  mass, COPD, pulmonary mycosis with plan for 3 mo f/u with CT by 03/2017 note.  . Alcohol abuse   . Anemia   . Arthritis    "right hip; lower back" (05/11/2017)  . Chronic systolic CHF (congestive heart failure) (HCC)    a. EF 20-25% by echo 2017. b. 40-45% by echo in 01/2017.  Marland Kitchen COPD (chronic obstructive pulmonary disease) (HCC)   . Dyspnea   . GERD (gastroesophageal reflux disease)   . History of blood transfusion 2004   "related to bleeding stomach ulcerts"  . History of stomach ulcers 2004  . Hypercholesterolemia   . Hypertension   . Malnutrition (HCC)   . Moderate mitral regurgitation   . Myocardial infarction [facility]) 2004   "think it was bleeding ulcers; not a heart attack" (05/11/2017), pt denies heart cath  . Peripheral vascular disease (HCC)   . Pre-diabetes   . Pulmonary hypertension (HCC)    a. severe pulm HTN by echo 01/2017.  . Tricuspid regurgitation 2017     Allergies as of 06/07/2017      Reactions   Penicillins Itching, Rash, Other (See Comments)   PATIENT HAS HAD A PCN REACTION WITH IMMEDIATE RASH, FACIAL/TONGUE/THROAT SWELLING, SOB, OR LIGHTHEADEDNESS WITH HYPOTENSION:  #  #  #  YES  #  #  #   Has patient had a PCN reaction causing severe rash involving mucus membranes or skin necrosis: No Has patient had a PCN reaction that required hospitalization: No Has patient had a PCN reaction occurring within the last 10 years: No If all of the above answers are "NO", then may proceed with Cephalosporin use.      Medication List    TAKE these medications   acetaminophen 500 MG tablet Commonly known as:  TYLENOL Take 2 tablets (1,000 mg total) by mouth every 6 (six) hours as needed. What changed:    how much to take  when to take this  reasons to take this   albuterol 108 (90 Base) MCG/ACT inhaler Commonly known as:  PROVENTIL HFA;VENTOLIN HFA Inhale 2 puffs into the lungs every 6 (six) hours as needed for wheezing or shortness of breath.   aspirin EC 81 MG  tablet Take 81 mg by mouth daily.   atorvastatin 20 MG tablet Commonly known as:  LIPITOR Take 20 mg by mouth daily.   budesonide-formoterol 160-4.5 MCG/ACT inhaler Commonly known as:  SYMBICORT Inhale 2 puffs into the lungs 2 (two) times daily.   doxycycline 50 MG capsule Commonly known as:  VIBRAMYCIN Take 1 capsule (50 mg total) by mouth 2 (two) times daily. Completion of hospital course   feeding supplement (ENSURE ENLIVE) Liqd Take 237 mLs by mouth 2 (two) times daily between meals.   folic acid 1 MG tablet Commonly known as:  FOLVITE Take 1 tablet (1 mg total) by mouth daily.   furosemide 20 MG tablet Commonly known as:  LASIX Take 1 tablet (20 mg total) by mouth  daily.   guaiFENesin 600 MG 12 hr tablet Commonly known as:  MUCINEX Take 1 tablet (600 mg total) by mouth 2 (two) times daily.   multivitamin with minerals Tabs tablet Take 1 tablet by mouth daily.   nicotine 21 mg/24hr patch Commonly known as:  NICODERM CQ - dosed in mg/24 hours Place 1 patch (21 mg total) onto the skin daily.   omeprazole 20 MG capsule Commonly known as:  PRILOSEC Take 1 capsule (20 mg total) by mouth daily as needed (for indegestion).   ondansetron 4 MG disintegrating tablet Commonly known as:  ZOFRAN ODT Take 1 tablet (4 mg total) by mouth every 8 (eight) hours as needed for nausea or vomiting. What changed:  reasons to take this   predniSONE 20 MG tablet Commonly known as:  DELTASONE Take in the morning: Take 3 tablets x 3days, then take 2 tablets x 3days, then take 1 tablet x 3days.   sildenafil 20 MG tablet Commonly known as:  REVATIO Take 1 tablet (20 mg total) by mouth 3 (three) times daily.   thiamine 100 MG tablet Take 1 tablet (100 mg total) by mouth daily.   traMADol 50 MG tablet Commonly known as:  ULTRAM Take 1 tablet (50 mg total) by mouth every 6 (six) hours as needed for up to 10 doses.            Durable Medical Equipment  (From admission, onward)          Start     Ordered   06/07/17 0815  For home use only DME Walker rolling  Once    Question:  Patient needs a walker to treat with the following condition  Answer:  Atherosclerosis of native artery of both lower extremities with intermittent claudication [facility])   06/07/17 0815      Instructions:  [facility] Discharge Instructions Open Aortic Surgery  Please refer to the following instructions for your post-procedure care. Your surgeon or Physician Assistant will discuss any changes with you.  Activity  Avoid lifting more than eight pounds (a gallon of milk) until after your first post-operative visit. You are encouraged to walk as much as you can. You can slowly return to normal activities but must avoid strenuous activity and heavy lifting until your doctor tells you it's OK. Heavy lifting can hurt the incision and cause a hernia. Avoid activities such as vacuuming or swinging a golf club. It is normal to feel tired for several weeks after your surgery. Do not drive until your doctor gives the OK and you are no longer taking prescription pain medications. It is also normal to have difficulty with sleep habits, eating and bowl movements after surgery. These will go away with time.  Bathing/Showering  You may shower after you go home. Do not soak in a bathtub, hot tub, or swim until the incision heals.  Incision Care  Shower every day. Clean your incision with mild soap and water. Pat the area dry with a clean towel. You do not need a bandage unless otherwise instructed. Do not apply any ointments or creams to your incision. You may have skin glue on your incision. Do not peel it off. It will come off on its own in about one week. If you have staples or sutures along your incision, they will be removed at your post op appointment.  If you have groin incisions, wash the groin wounds with soap and water daily and pat dry. (No tub  bath-only shower)  Then  put a dry gauze or washcloth in the groin to keep this area dry to help prevent wound infection.  Do this daily and as needed.  Do not use Vaseline or neosporin on your incisions.  Only use soap and water on your incisions and then protect and keep dry.  Diet  Resume your normal diet. There are no special food restriction following this procedure. A low fat/low cholesterol diet is recommended for all patients with vascular disease. After your aortic surgery, it's normal to feel full faster than usual and to not feel as hungry as you normally would. You will probably lose weight initially following your surgery. It's best to eat small, frequent meals over the course of the day. Call the office if you find that you are unable to eat even small meals. In order to heal from your surgery, it is CRITICAL to get adequate nutrition. Your body requires vitamins, minerals, and protein. Vegetables are the best source of vitamins and minerals. causing pain, you may take over-the-counter pain reliever such as acetaminophen (Tylenol). If you were prescribed a stronger pain medication, please be aware these medication can cause nausea and constipation. Prevent nausea by taking the medication with a snack or meal. Avoid constipation by drinking plenty of fluids and eating foods with a high amount of fiber, such as fruits, vegetables and grains. Take 100mg  of the over-the-counter stool softener Colace twice a day as needed to help with constipation. A laxative, such as Milk of Magnesia, may be recommended for you at this time. Do not take a laxative unless your surgeon or Physician Assistant. tells you it's OK. Do not take Tylenol if you are taking stronger pain medications (such as Percocet).  Follow Up  Our office will schedule a follow up appointment 2-3 weeks after discharge.  Please call us immediately for any of the following conditions    .     Severe or worsening pain in your legs or feet or in your abdomen back  or chest. Increased pain, redness drainage (pus) from your incision site. Increased abdominal pain, bloating, nausea, vomiting, or persistent diarrhea. Fever of 101 degrees or higher. Swelling in your leg (s).  Reduce your risk of vascular disease  Stop smoking. If you would like help, call [facility] at 1-800-QUIT-NOW ((613) 713-1402) or [place] at (814) 403-3690. Manage your cholesterol Maintain a desired weight Control your diabetes Keep your blood pressure down  If you have any questions please call the office at [phone].  Disposition: Home with HH PT/OT  Patient's condition: is Good  Follow up: 1. Dr. Imogene Burn in 2 weeks   Emilie Rutter, PA-C [facility] [phone] 06/07/2017  8:45 AM   - For VQI Registry use -  Post-op:  Time to Extubation: [x]  In OR, [ ]  < 12 hrs, [ ]  12-24 hrs, [ ]  >=24 hrs Vasopressors Req. Post-op: Yes ICU Stay: 4 days in ICU Transfusion: No   If yes,  units given MI: No, [ ]  Troponin only, [ ]  EKG or Clinical New Arrhythmia: No  Complications: CHF: No Resp failure: Yes, x hypoxia [ ]  Pneumonia, [ ]  Ventilator Chg in renal function: No, [ ]  Inc. Cr > 0.5, [ ]  Temp. Dialysis, [ ]  Permanent dialysis Leg ischemia: No, no Surgery needed, [ ]  Yes, Surgery needed, [ ]  Amputation Bowel ischemia: No, [ ]  Medical Rx, [ ]  Surgical Rx Wound complication: No, [ ]  Superficial separation/infection, [ ]  Return to OR Return  to OR: No  Return to OR for bleeding: No Stroke: No, [ ]  Minor, [ ]  Major  Discharge medications: Statin use:  Yes If No:   ASA use:  Yes  If No:   Plavix use:  No  Beta blocker use:  No  ACEI use:  No ARB use:  No CCB use:  No Coumadin use:  No

## 2017-06-07 NOTE — Progress Notes (Signed)
D/c instruction given to pt. Prescriptions given. Rolling walker for home delivered to room. IV removed, clean and intact. Telemetry removed.  Versie Starks, RN

## 2017-06-07 NOTE — Progress Notes (Addendum)
Advanced Heart Failure Rounding Note  PCP-Cardiologist: No primary care provider on file.   Subjective:   On Friday he started on sildenafil.   Denies SOB/dizziness. Wants to go home.   Objective:   Weight Range: 105 lb 6.1 oz (47.8 kg) Body mass index is 18.67 kg/m.   Vital Signs:   Temp:  [97.8 F (36.6 C)-98.3 F (36.8 C)] 97.8 F (36.6 C) (04/01 0541) Pulse Rate:  [86-103] 103 (04/01 0955) Resp:  [14-27] 19 (04/01 0541) BP: (116-125)/(74-83) 118/76 (04/01 0955) SpO2:  [91 %-99 %] 99 % (04/01 0939) FiO2 (%):  [28 %] 28 % (03/31 1453) Last BM Date: 06/07/17  Weight change: Filed Weights   06/03/17 0500 06/04/17 0530 06/05/17 0500  Weight: 119 lb 4.3 oz (54.1 kg) 110 lb 7.2 oz (50.1 kg) 105 lb 6.1 oz (47.8 kg)    Intake/Output:   Intake/Output Summary (Last 24 hours) at 06/07/2017 1014 Last data filed at 06/07/2017 0600 Gross per 24 hour  Intake -  Output 981 ml  Net -981 ml     Physical Exam    General:  Sitting on the side of the bed. No resp difficulty HEENT: normal Neck: supple. no JVD. Carotids 2+ bilat; no bruits. No lymphadenopathy or thryomegaly appreciated. Cor: PMI nondisplaced. Regular rate & rhythm. No rubs,. RV lift 2/6 TR  Lungs: clear Abdomen: soft, nontender, nondistended. No hepatosplenomegaly. No bruits or masses. Good bowel sounds. Extremities: no cyanosis, clubbing, rash, edema Neuro: alert & orientedx3, cranial nerves grossly intact. moves all 4 extremities w/o difficulty. Affect pleasant    Telemetry   NSR -Sinus Tach 90-100s   EKG    No new tracings.  Labs    CBC Recent Labs    06/06/17 0248  WBC 7.0  HGB 12.6*  HCT 37.3*  MCV 98.7  PLT 272   Basic Metabolic Panel Recent Labs    86/57/84 0248  NA 132*  K 3.3*  CL 100*  CO2 24  GLUCOSE 122*  BUN <5*  CREATININE 0.56*  CALCIUM 8.0*   Liver Function Tests No results for input(s): AST, ALT, ALKPHOS, BILITOT, PROT, ALBUMIN in the last 72 hours. No results  for input(s): LIPASE, AMYLASE in the last 72 hours. Cardiac Enzymes No results for input(s): CKTOTAL, CKMB, CKMBINDEX, TROPONINI in the last 72 hours.  BNP: BNP (last 3 results) Recent Labs    05/11/17 0808  BNP 1,284.4*    ProBNP (last 3 results) No results for input(s): PROBNP in the last 8760 hours.   D-Dimer No results for input(s): DDIMER in the last 72 hours. Hemoglobin A1C No results for input(s): HGBA1C in the last 72 hours. Fasting Lipid Panel No results for input(s): CHOL, HDL, LDLCALC, TRIG, CHOLHDL, LDLDIRECT in the last 72 hours. Thyroid Function Tests No results for input(s): TSH, T4TOTAL, T3FREE, THYROIDAB in the last 72 hours.  Invalid input(s): FREET3  Other results:  Imaging   No results found.  Medications:    Scheduled Medications: . aspirin EC  81 mg Oral Daily  . atorvastatin  20 mg Oral Daily  . budesonide (PULMICORT) nebulizer solution  0.5 mg Nebulization BID  . docusate sodium  100 mg Oral Daily  . enoxaparin (LOVENOX) injection  40 mg Subcutaneous Q24H  . folic acid  1 mg Oral Daily  . furosemide  20 mg Oral Daily  . metoprolol tartrate  12.5 mg Oral Q6H  . mometasone-formoterol  2 puff Inhalation BID  . nicotine  21 mg Transdermal  Daily  . pantoprazole  40 mg Oral Daily  . sildenafil  20 mg Oral TID  . sodium chloride flush  3 mL Intravenous Q12H  . thiamine  100 mg Oral Daily    Infusions: . sodium chloride Stopped (06/04/17 0000)  . dextrose 5 % and 0.9% NaCl Stopped (06/04/17 1507)  . magnesium sulfate 1 - 4 g bolus IVPB      PRN Medications: acetaminophen **OR** acetaminophen, albuterol, alum & mag hydroxide-simeth, bisacodyl, guaiFENesin-dextromethorphan, hydrALAZINE, HYDROmorphone (DILAUDID) injection, Influenza vac split quadrivalent PF, ipratropium-albuterol, LORazepam, magnesium sulfate 1 - 4 g bolus IVPB, menthol-cetylpyridinium, metoprolol tartrate, ondansetron, phenol, pneumococcal 23 valent vaccine, potassium  chloride, sodium chloride flush  Patient Profile   Wayne [name] is a 62 y.o. male with multiple medical problems as below. AHF team asked to see by Dr. Richardson Landry due to intra-operative discovery of severe pulmonary HTN.  Assessment/Plan   1. PAD s/p aortobifem bypass grafting  - Stable per Dr. Imogene Burn. Home today.   2. 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  - Mean PA pressures in 50s by swan.  - Continue sildenafil 20 mg TID.  --Will set up PFTs as an outpatient.   3. Acute on chronic systolic CHF EF 40-45% - Echo 08/2261 LVEF 20-25% ([facility])  - Echo 06/03/17 LVEF 60-65%, Grade 1 DD, Mild MR, Mod LAE, Mild TR, PA peak pressure 71 mm HG. - Myoview June 08/2016 LVEF 40% ([facility]), Of note, no significant reversible ischemia or infarct. - Volume status stable. Continue 20 mg lasix daily.   4. COPD with lung mass/pulmonary mycosis - CCM following.  - CXR 06/03/17 right slight increase in R basilar atelectasis.  - Bronchodilators added.  5. Alcohol abuse - Will need complete cessation.  - On CIWA protocol  6. Malnutrition - Per primary with abdominal surgery.   7. Essential HTN - Meds as above.   Medication concerns reviewed with patient and pharmacy team. Barriers identified: Started on sildenafil. Has medicaid.   Follow up with Dr Gala Romney 06/14/2017 at 2:20 . Has transportation provided through [facility].   Length of Stay: 6  Amy Clegg, NP  06/07/2017, 10:14 AM  Advanced Heart Failure Team Pager [phone] (M-F; 7a - 4p)  Please contact [facility] for night-coverage after hours (4p -7a ) and weekends on [email or web address]  Patient seen and examined with Tonye Becket, NP. We discussed all aspects of the encounter. I agree with the assessment and plan as stated above.   Stable post-op. Denies SOB. Vitals stable. Ok for d/c on sildenafil. F/u with his cardiologist at [facility]. Happy to see him back in referral at any  point.    Arvilla Meres, MD  1:48 PM

## 2017-06-08 ENCOUNTER — Telehealth: Payer: Self-pay | Admitting: Vascular Surgery

## 2017-06-08 NOTE — Telephone Encounter (Signed)
-----   Message from Sharee Pimple, RN sent at 06/07/2017  2:22 PM EDT ----- Regarding: 2 weeks postop bypass   ----- Message ----- From: Forestine Na Sent: 06/07/2017   8:21 AM To: [facility] Charge Pool  Can you schedule an appt for this pt in about 2 weeks with Dr. Imogene Burn.  PO ABF bypass. Thanks, [employer]

## 2017-06-08 NOTE — Telephone Encounter (Signed)
Spoke to pt and sister for appt on 4/17  Mailed lttr

## 2017-06-08 NOTE — Telephone Encounter (Signed)
-----   Message from Marilyn K McChesney, RN sent at 06/07/2017  2:22 PM EDT ----- °Regarding: 2 weeks postop bypass ° ° °----- Message ----- °From: Eveland, Matthew, PA-C °Sent: 06/07/2017   8:21 AM °To: [facility] Charge Pool ° °Can you schedule an appt for this pt in about 2 weeks with Dr. Chen.  PO ABF bypass. °Thanks, °Matt °

## 2017-06-08 NOTE — Telephone Encounter (Signed)
Spoke to pt for appt 4/17  [facility] letter

## 2017-06-11 ENCOUNTER — Telehealth: Payer: Self-pay

## 2017-06-11 ENCOUNTER — Other Ambulatory Visit: Payer: Self-pay

## 2017-06-11 ENCOUNTER — Emergency Department (HOSPITAL_BASED_OUTPATIENT_CLINIC_OR_DEPARTMENT_OTHER): Payer: Medicaid Other

## 2017-06-11 ENCOUNTER — Observation Stay (HOSPITAL_BASED_OUTPATIENT_CLINIC_OR_DEPARTMENT_OTHER)
Admission: EM | Admit: 2017-06-11 | Discharge: 2017-06-12 | Disposition: A | Discharge status: Home / Self Care | Payer: Medicaid Other | Attending: Internal Medicine | Admitting: Internal Medicine

## 2017-06-11 ENCOUNTER — Encounter (HOSPITAL_BASED_OUTPATIENT_CLINIC_OR_DEPARTMENT_OTHER): Payer: Self-pay | Admitting: *Deleted

## 2017-06-11 DIAGNOSIS — J9691 Respiratory failure, unspecified with hypoxia: Secondary | ICD-10-CM | POA: Diagnosis present

## 2017-06-11 DIAGNOSIS — J449 Chronic obstructive pulmonary disease, unspecified: Secondary | ICD-10-CM | POA: Diagnosis present

## 2017-06-11 DIAGNOSIS — M1611 Unilateral primary osteoarthritis, right hip: Secondary | ICD-10-CM | POA: Insufficient documentation

## 2017-06-11 DIAGNOSIS — I2510 Atherosclerotic heart disease of native coronary artery without angina pectoris: Secondary | ICD-10-CM | POA: Insufficient documentation

## 2017-06-11 DIAGNOSIS — Z8719 Personal history of other diseases of the digestive system: Secondary | ICD-10-CM | POA: Diagnosis not present

## 2017-06-11 DIAGNOSIS — J9811 Atelectasis: Secondary | ICD-10-CM | POA: Insufficient documentation

## 2017-06-11 DIAGNOSIS — R7303 Prediabetes: Secondary | ICD-10-CM | POA: Insufficient documentation

## 2017-06-11 DIAGNOSIS — E878 Other disorders of electrolyte and fluid balance, not elsewhere classified: Secondary | ICD-10-CM | POA: Diagnosis not present

## 2017-06-11 DIAGNOSIS — K219 Gastro-esophageal reflux disease without esophagitis: Secondary | ICD-10-CM | POA: Diagnosis not present

## 2017-06-11 DIAGNOSIS — Z880 Allergy status to penicillin: Secondary | ICD-10-CM | POA: Insufficient documentation

## 2017-06-11 DIAGNOSIS — E876 Hypokalemia: Secondary | ICD-10-CM | POA: Insufficient documentation

## 2017-06-11 DIAGNOSIS — M5137 Other intervertebral disc degeneration, lumbosacral region: Secondary | ICD-10-CM | POA: Diagnosis not present

## 2017-06-11 DIAGNOSIS — J9601 Acute respiratory failure with hypoxia: Secondary | ICD-10-CM | POA: Diagnosis not present

## 2017-06-11 DIAGNOSIS — Z720 Tobacco use: Secondary | ICD-10-CM | POA: Diagnosis present

## 2017-06-11 DIAGNOSIS — I700 Atherosclerosis of aorta: Secondary | ICD-10-CM | POA: Diagnosis not present

## 2017-06-11 DIAGNOSIS — I5022 Chronic systolic (congestive) heart failure: Secondary | ICD-10-CM | POA: Diagnosis not present

## 2017-06-11 DIAGNOSIS — F17210 Nicotine dependence, cigarettes, uncomplicated: Secondary | ICD-10-CM | POA: Insufficient documentation

## 2017-06-11 DIAGNOSIS — I2721 Secondary pulmonary arterial hypertension: Secondary | ICD-10-CM | POA: Diagnosis not present

## 2017-06-11 DIAGNOSIS — I110 Hypertensive heart disease with heart failure: Principal | ICD-10-CM | POA: Insufficient documentation

## 2017-06-11 DIAGNOSIS — R1032 Left lower quadrant pain: Secondary | ICD-10-CM | POA: Diagnosis present

## 2017-06-11 DIAGNOSIS — I509 Heart failure, unspecified: Secondary | ICD-10-CM

## 2017-06-11 DIAGNOSIS — Z7982 Long term (current) use of aspirin: Secondary | ICD-10-CM | POA: Insufficient documentation

## 2017-06-11 DIAGNOSIS — I739 Peripheral vascular disease, unspecified: Secondary | ICD-10-CM | POA: Insufficient documentation

## 2017-06-11 DIAGNOSIS — J439 Emphysema, unspecified: Secondary | ICD-10-CM | POA: Diagnosis not present

## 2017-06-11 DIAGNOSIS — I252 Old myocardial infarction: Secondary | ICD-10-CM | POA: Insufficient documentation

## 2017-06-11 DIAGNOSIS — M5136 Other intervertebral disc degeneration, lumbar region: Secondary | ICD-10-CM | POA: Insufficient documentation

## 2017-06-11 DIAGNOSIS — K5900 Constipation, unspecified: Secondary | ICD-10-CM | POA: Insufficient documentation

## 2017-06-11 DIAGNOSIS — R188 Other ascites: Secondary | ICD-10-CM | POA: Insufficient documentation

## 2017-06-11 DIAGNOSIS — E7800 Pure hypercholesterolemia, unspecified: Secondary | ICD-10-CM | POA: Diagnosis not present

## 2017-06-11 DIAGNOSIS — Z79899 Other long term (current) drug therapy: Secondary | ICD-10-CM | POA: Insufficient documentation

## 2017-06-11 DIAGNOSIS — I10 Essential (primary) hypertension: Secondary | ICD-10-CM | POA: Diagnosis present

## 2017-06-11 LAB — CBC WITH DIFFERENTIAL/PLATELET
Basophils Absolute: 0 10*3/uL (ref 0.0–0.1)
Basophils Relative: 1 %
EOS ABS: 0 10*3/uL (ref 0.0–0.7)
EOS PCT: 0 %
HCT: 39.8 % (ref 39.0–52.0)
Hemoglobin: 13.7 g/dL (ref 13.0–17.0)
LYMPHS ABS: 1.4 10*3/uL (ref 0.7–4.0)
LYMPHS PCT: 21 %
MCH: 33.7 pg (ref 26.0–34.0)
MCHC: 34.4 g/dL (ref 30.0–36.0)
MCV: 98 fL (ref 78.0–100.0)
MONOS PCT: 11 %
Monocytes Absolute: 0.7 10*3/uL (ref 0.1–1.0)
Neutro Abs: 4.3 10*3/uL (ref 1.7–7.7)
Neutrophils Relative %: 67 %
PLATELETS: 464 10*3/uL — AB (ref 150–400)
RBC: 4.06 MIL/uL — ABNORMAL LOW (ref 4.22–5.81)
RDW: 13.5 % (ref 11.5–15.5)
WBC: 6.5 10*3/uL (ref 4.0–10.5)

## 2017-06-11 LAB — URINALYSIS, ROUTINE W REFLEX MICROSCOPIC
Bilirubin Urine: NEGATIVE
GLUCOSE, UA: NEGATIVE mg/dL
HGB URINE DIPSTICK: NEGATIVE
Ketones, ur: NEGATIVE mg/dL
Leukocytes, UA: NEGATIVE
Nitrite: NEGATIVE
PROTEIN: NEGATIVE mg/dL
pH: 7 (ref 5.0–8.0)

## 2017-06-11 LAB — COMPREHENSIVE METABOLIC PANEL
ALK PHOS: 106 U/L (ref 38–126)
ALT: 17 U/L (ref 17–63)
ANION GAP: 11 (ref 5–15)
AST: 28 U/L (ref 15–41)
Albumin: 2.9 g/dL — ABNORMAL LOW (ref 3.5–5.0)
BUN: 5 mg/dL — AB (ref 6–20)
CHLORIDE: 100 mmol/L — AB (ref 101–111)
CO2: 24 mmol/L (ref 22–32)
Calcium: 8.5 mg/dL — ABNORMAL LOW (ref 8.9–10.3)
Creatinine, Ser: 0.75 mg/dL (ref 0.61–1.24)
Glucose, Bld: 112 mg/dL — ABNORMAL HIGH (ref 65–99)
Potassium: 3.3 mmol/L — ABNORMAL LOW (ref 3.5–5.1)
SODIUM: 135 mmol/L (ref 135–145)
TOTAL PROTEIN: 6.7 g/dL (ref 6.5–8.1)
Total Bilirubin: 0.7 mg/dL (ref 0.3–1.2)

## 2017-06-11 LAB — LIPASE, BLOOD: LIPASE: 24 U/L (ref 11–51)

## 2017-06-11 LAB — BRAIN NATRIURETIC PEPTIDE: B Natriuretic Peptide: 840.2 pg/mL — ABNORMAL HIGH (ref 0.0–100.0)

## 2017-06-11 MED ORDER — PANTOPRAZOLE SODIUM 40 MG PO TBEC: 40.0000 mg | DELAYED_RELEASE_TABLET | Freq: Every day | ORAL | Status: DC

## 2017-06-11 MED ORDER — ALBUTEROL SULFATE (2.5 MG/3ML) 0.083% IN NEBU: 2.5000 mg | INHALATION_SOLUTION | Freq: Four times a day (QID) | RESPIRATORY_TRACT | Status: DC | PRN

## 2017-06-11 MED ORDER — FOLIC ACID 1 MG PO TABS: 1.0000 mg | ORAL_TABLET | Freq: Every day | ORAL | Status: DC

## 2017-06-11 MED ORDER — FUROSEMIDE 20 MG PO TABS
20.0000 mg | ORAL_TABLET | Freq: Every day | ORAL | Status: DC
  Administered 2017-06-12: 20 mg via ORAL
  Filled 2017-06-11: qty 1

## 2017-06-11 MED ORDER — MORPHINE SULFATE (PF) 4 MG/ML IV SOLN
4.0000 mg | Freq: Once | INTRAVENOUS | Status: AC
  Administered 2017-06-11: 4 mg via INTRAVENOUS
  Filled 2017-06-11: qty 1

## 2017-06-11 MED ORDER — IOPAMIDOL (ISOVUE-370) INJECTION 76%
100.0000 mL | Freq: Once | INTRAVENOUS | Status: AC | PRN
  Administered 2017-06-11: 75 mL via INTRAVENOUS

## 2017-06-11 MED ORDER — MOMETASONE FURO-FORMOTEROL FUM 200-5 MCG/ACT IN AERO
2.0000 | INHALATION_SPRAY | Freq: Two times a day (BID) | RESPIRATORY_TRACT | Status: DC
  Filled 2017-06-11: qty 8.8

## 2017-06-11 MED ORDER — ONDANSETRON HCL 4 MG/2ML IJ SOLN
4.0000 mg | Freq: Once | INTRAMUSCULAR | Status: AC
  Administered 2017-06-11: 4 mg via INTRAVENOUS
  Filled 2017-06-11: qty 2

## 2017-06-11 MED ORDER — ALBUTEROL SULFATE HFA 108 (90 BASE) MCG/ACT IN AERS: 2.0000 | INHALATION_SPRAY | Freq: Four times a day (QID) | RESPIRATORY_TRACT | Status: DC | PRN

## 2017-06-11 MED ORDER — ASPIRIN EC 81 MG PO TBEC: 81.0000 mg | DELAYED_RELEASE_TABLET | Freq: Every day | ORAL | Status: DC

## 2017-06-11 MED ORDER — IOPAMIDOL (ISOVUE-370) INJECTION 76%
100.0000 mL | Freq: Once | INTRAVENOUS | Status: AC | PRN
  Administered 2017-06-11: 100 mL via INTRAVENOUS

## 2017-06-11 MED ORDER — SODIUM CHLORIDE 0.9 % IV SOLN
Freq: Once | INTRAVENOUS | Status: AC
  Administered 2017-06-11: 22:00:00 via INTRAVENOUS

## 2017-06-11 NOTE — ED Notes (Signed)
Pt O2 turned down to 2L- remains at 96%.

## 2017-06-11 NOTE — ED Notes (Signed)
ED Provider at bedside. 

## 2017-06-11 NOTE — ED Notes (Signed)
Patient transported to CT 

## 2017-06-11 NOTE — ED Notes (Signed)
ED Provider at bedside, Dr. Butler  

## 2017-06-11 NOTE — ED Notes (Signed)
Paged Hospitalist via [facility] for consult 8:05pm

## 2017-06-11 NOTE — ED Notes (Signed)
Pt sts he is unable to void at this time  

## 2017-06-11 NOTE — ED Provider Notes (Signed)
[facility] 3E CHF Provider Note   CSN: [id number] Arrival date & time: 06/11/17  1418     History   Chief Complaint Chief Complaint  Patient presents with  . Abdominal Pain    HPI Wayne [name] is a 62 y.o. male.  HPI   Patient is a 62 year old male who presents the ED today complaining of 7/10 bilateral lower abdominal pain that has been ongoing for 4 days, worse in LLQ. States pain has been worsening since onset and has been constant.  Describes as a dull pain.  Also has some abd distension. Has tried taking tramadol and Tylenol at home with no relief.  States pain is improved when he lays on his back, and seems to be worse when he sits up.  He denies any fevers, chest pain, shortness of breath, nausea, vomiting, diarrhea, constipation.  Last bowel movement was this morning.  No blood in stool.  Denies any dysuria, frequency, urgency, hematuria.  Denies any flank pain or back pain.  Denies any swelling, pain, numbness in the lower extremities.  Patient states he took milk of magnesia this morning with no improvement of symptoms.  Review of prior records patient was recently admitted on 06/01/17 for aortobifemoral bypass and right femoral endarterectomy by Dr. Imogene Burn and Dr. Jean Rosenthal and he was discharged on 06/07/2017 in stable condition.   Past Medical History:  Diagnosis Date  . Abnormal pulmonary finding    a. preadmission note indicates pt followed by Dr. Bethanie Dicker for CHF, lung mass, COPD, pulmonary mycosis with plan for 3 mo f/u with CT by 03/2017 note.  . Alcohol abuse   . Anemia   . Arthritis    "right hip; lower back" (05/11/2017)  . Chronic systolic CHF (congestive heart failure) (HCC)    a. EF 20-25% by echo 2017. b. 40-45% by echo in 01/2017.  Marland Kitchen COPD (chronic obstructive pulmonary disease) (HCC)   . Dyspnea   . GERD (gastroesophageal reflux disease)   . History of blood transfusion 2004   "related to bleeding stomach ulcerts"  . History of stomach  ulcers 2004  . Hypercholesterolemia   . Hypertension   . Malnutrition (HCC)   . Moderate mitral regurgitation   . Myocardial infarction [facility]) 2004   "think it was bleeding ulcers; not a heart attack" (05/11/2017), pt denies heart cath  . Peripheral vascular disease (HCC)   . Pre-diabetes   . Pulmonary hypertension (HCC)    a. severe pulm HTN by echo 01/2017.  . Tricuspid regurgitation 2017    Patient Active Problem List   Diagnosis Date Noted  . Abdominal pain, acute, left lower quadrant 06/12/2017  . Respiratory failure with hypoxia (HCC) 06/12/2017  . Acute left lower quadrant pain 06/12/2017  . PAH (pulmonary artery hypertension) (HCC)   . 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  . Malnutrition of moderate degree 05/12/2017  . COPD with acute exacerbation (HCC) 05/11/2017  . Fever 05/11/2017  . ETOH abuse 05/11/2017  . Anemia   . COPD (chronic obstructive pulmonary disease) (HCC)   . Hypertension   . Critical lower limb ischemia 04/01/2017  . Peripheral vascular disease (HCC) 03/22/2017  . Simple chronic bronchitis (HCC) 03/22/2017  . 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 Degenerative disc disease, lumbar 12/11/2016  . Primary osteoarthritis of right hip 12/11/2016  . Hypokalemia 01/19/2016  . Chronic systolic heart failure (HCC) 01/18/2016  . Acute respiratory failure with hypoxia (HCC) 01/18/2016  . Bullous lesion 01/18/2016  .  Drinks beer 01/18/2016  . Hyperlipidemia 01/18/2016  . Hyponatremia 01/18/2016  . Tobacco abuse 01/18/2016    Past Surgical History:  Procedure Laterality Date  . ABDOMINAL AORTOGRAM W/LOWER EXTREMITY N/A 04/01/2017   Procedure: ABDOMINAL AORTOGRAM W/LOWER EXTREMITY;  Surgeon: Fransisco Hertz, MD;  Location: [facility] INVASIVE CV LAB;  Service: Cardiovascular;  Laterality: N/A;  Bilateral  . AORTA - BILATERAL FEMORAL ARTERY BYPASS GRAFT N/A 06/01/2017   Procedure: AORTA BIFEMORAL BYPASS GRAFT with Graft and Right Femoral Endartarectomy;   Surgeon: Fransisco Hertz, MD;  Location: [facility] OR;  Service: Vascular;  Laterality: N/A;  . COLONOSCOPY    . HEMORRHOIDECTOMY WITH HEMORRHOID BANDING    . MASS EXCISION Left 04/13/2017   Procedure: EXCISION EPIDERMAL INCLUSION CYST LEFT GROIN;  Surgeon: Fransisco Hertz, MD;  Location: [facility] OR;  Service: Vascular;  Laterality: Left;  . PERIPHERAL VASCULAR INTERVENTION Left    "groin; for clogged artery"        Home Medications    Prior to Admission medications   Medication Sig Start Date End Date Taking? Authorizing Provider  acetaminophen (TYLENOL) 500 MG tablet Take 2 tablets (1,000 mg total) by mouth every 6 (six) hours as needed. Patient taking differently: Take 250 mg by mouth daily as needed for moderate pain or headache.  02/17/16   Roxy Horseman, PA-C  albuterol (PROVENTIL HFA;VENTOLIN HFA) 108 (90 Base) MCG/ACT inhaler Inhale 2 puffs into the lungs every 6 (six) hours as needed for wheezing or shortness of breath.    [provider]  aspirin EC 81 MG tablet Take 81 mg by mouth daily.    [provider]  atorvastatin (LIPITOR) 20 MG tablet Take 20 mg by mouth daily.    [provider]  budesonide-formoterol (SYMBICORT) 160-4.5 MCG/ACT inhaler Inhale 2 puffs into the lungs 2 (two) times daily.    [provider]  doxycycline (VIBRAMYCIN) 50 MG capsule Take 1 capsule (50 mg total) by mouth 2 (two) times daily. Completion of hospital course Patient not taking: Reported on 05/18/2017 05/14/17   Edsel Petrin, DO  feeding supplement, ENSURE ENLIVE, (ENSURE ENLIVE) LIQD Take 237 mLs by mouth 2 (two) times daily between meals. Patient not taking: Reported on 05/18/2017 05/14/17   Edsel Petrin, DO  folic acid (FOLVITE) 1 MG tablet Take 1 tablet (1 mg total) by mouth daily. 05/15/17   Mikhail, Nita Sells, DO  furosemide (LASIX) 20 MG tablet Take 1 tablet (20 mg total) by mouth daily. 05/15/17   Mikhail, Nita Sells, DO  guaiFENesin (MUCINEX) 600 MG 12 hr tablet Take 1  tablet (600 mg total) by mouth 2 (two) times daily. Patient not taking: Reported on 05/18/2017 05/14/17   Edsel Petrin, DO  Multiple Vitamin (MULTIVITAMIN WITH MINERALS) TABS tablet Take 1 tablet by mouth daily. Patient not taking: Reported on 05/18/2017 05/15/17   Edsel Petrin, DO  nicotine (NICODERM CQ - DOSED IN MG/24 HOURS) 21 mg/24hr patch Place 1 patch (21 mg total) onto the skin daily. Patient not taking: Reported on 05/18/2017 05/15/17   Edsel Petrin, DO  omeprazole (PRILOSEC) 20 MG capsule Take 1 capsule (20 mg total) by mouth daily as needed (for indegestion). 05/14/17   Mikhail, Nita Sells, DO  ondansetron (ZOFRAN ODT) 4 MG disintegrating tablet Take 1 tablet (4 mg total) by mouth every 8 (eight) hours as needed for nausea or vomiting. Patient taking differently: Take 4 mg by mouth every 8 (eight) hours as needed for nausea or vomiting (before pain medications).  01/27/16   Molpus, Jonny Ruiz,  MD  predniSONE (DELTASONE) 20 MG tablet Take in the morning: Take 3 tablets x 3days, then take 2 tablets x 3days, then take 1 tablet x 3days. 05/15/17   Mikhail, Nita Sells, DO  sildenafil (REVATIO) 20 MG tablet Take 1 tablet (20 mg total) by mouth 3 (three) times daily. 06/07/17   Emilie Rutter, PA-C  thiamine 100 MG tablet Take 1 tablet (100 mg total) by mouth daily. 05/15/17   Mikhail, Nita Sells, DO  traMADol (ULTRAM) 50 MG tablet Take 1 tablet (50 mg total) by mouth every 6 (six) hours as needed for up to 10 doses. 06/07/17   Emilie Rutter, PA-C    Family History Family History  Problem Relation Age of Onset  . Hypertension Sister   . Hyperlipidemia Sister   . Diabetes Sister   . Peripheral Artery Disease Father     Social History Social History   Tobacco Use  . Smoking status: Current Every Day Smoker    Packs/day: 0.25    Years: 50.00    Pack years: 12.50    Types: Cigarettes  . Smokeless tobacco: Never Used  Substance Use Topics  . Alcohol use: Yes    Alcohol/week: 4.8 oz    Types: 8  Cans of beer per week  . Drug use: No     Allergies   Penicillins   Review of Systems Review of Systems  Constitutional: Negative for fever.  HENT: Positive for sore throat.   Eyes: Negative for visual disturbance.  Respiratory: Negative for shortness of breath.   Cardiovascular: Negative for chest pain, palpitations and leg swelling.  Gastrointestinal: Positive for abdominal distention. Negative for abdominal pain, blood in stool, constipation, diarrhea, nausea and vomiting.  Genitourinary: Negative for dysuria and hematuria.  Musculoskeletal: Negative for back pain.       No pain to ble  Skin: Negative for color change and rash.  Neurological: Negative for dizziness, light-headedness, numbness and headaches.  All other systems reviewed and are negative.    Physical Exam Updated Vital Signs BP 138/86 (BP Location: Right Arm)   Pulse 87   Temp 98 F (36.7 C) (Oral)   Resp 18   Ht 5\' 3"  (1.6 m)   Wt 47.9 kg (105 lb 9.6 oz)   SpO2 100%   BMI 18.71 kg/m   Physical Exam  Constitutional: He appears well-developed and well-nourished.  HENT:  Head: Normocephalic and atraumatic.  Eyes: Conjunctivae are normal.  Neck: Neck supple.  Cardiovascular: Normal rate, regular rhythm, normal heart sounds and intact distal pulses.  No murmur heard. Radial pulses 2+ bilat. DP pulses intact but weak bilaterally. ble warm and well perfused.   Pulmonary/Chest: Effort normal and breath sounds normal. No respiratory distress. He has no wheezes. He has no rales.  Abdominal: Soft.  ttp to llq and suprapubic abd bilat, worse on left, surgical incision extending from upper abdomen down to lower abdomen along midline.  Staples in place.  No erythema along incision, no evidence of cellulitis. Incisions to bilat pelvic area are intact without evidence of surrounding cellulitis, nontender to palpitation  Musculoskeletal: He exhibits no edema.  Neurological: He is alert.  Skin: Skin is warm and  dry.  Psychiatric: He has a normal mood and affect.  Nursing note and vitals reviewed.    ED Treatments / Results  Labs (all labs ordered are listed, but only abnormal results are displayed) Labs Reviewed  CBC WITH DIFFERENTIAL/PLATELET - Abnormal; Notable for the following components:      Result  Value   RBC 4.06 (*)    Platelets 464 (*)    All other components within normal limits  COMPREHENSIVE METABOLIC PANEL - Abnormal; Notable for the following components:   Potassium 3.3 (*)    Chloride 100 (*)    Glucose, Bld 112 (*)    BUN 5 (*)    Calcium 8.5 (*)    Albumin 2.9 (*)    All other components within normal limits  URINALYSIS, ROUTINE W REFLEX MICROSCOPIC - Abnormal; Notable for the following components:   Specific Gravity, Urine <1.005 (*)    All other components within normal limits  BRAIN NATRIURETIC PEPTIDE - Abnormal; Notable for the following components:   B Natriuretic Peptide 840.2 (*)    All other components within normal limits  LIPASE, BLOOD    EKG None  Radiology Ct Angio Chest Pe W And/or Wo Contrast  Result Date: 06/11/2017 CLINICAL DATA:  Abdominal pain.  Recent femoral artery surgery. EXAM: CT ANGIOGRAPHY CHEST WITH CONTRAST TECHNIQUE: Multidetector CT imaging of the chest was performed using the standard protocol during bolus administration of intravenous contrast. Multiplanar CT image reconstructions and MIPs were obtained to evaluate the vascular anatomy. CONTRAST:  75mL ISOVUE-370 IOPAMIDOL (ISOVUE-370) INJECTION 76%. Reduced dose was authorized, the patient had a prior contrast enhanced exam today. COMPARISON:  05/11/2017 CT chest FINDINGS: Cardiovascular: Coronary, aortic arch, and branch vessel atherosclerotic vascular disease. Mild cardiomegaly with small pericardial effusion. No filling defect is identified in the pulmonary arterial tree to suggest pulmonary embolus. Mediastinum/Nodes: Right infrahilar lymph node 0.6 cm in short axis on image 55/4.  No overt pathologic adenopathy in the chest. Lungs/Pleura: Severe centrilobular emphysema. Mild airway thickening. Stable 3 by 5 mm subpleural lymph node along the right lower lobe side of the major fissure on image 57/5. Upper Abdomen: Perihepatic and perisplenic ascites. Musculoskeletal: There is a suggestion of low-grade diffuse subcutaneous edema. Sub xiphoid skin clips with a fluid collection in the subxiphoid region, correlate with recent operative intervention. Lower cervical spondylosis. Review of the MIP images confirms the above findings. IMPRESSION: 1. No filling defect is identified in the pulmonary arterial tree to suggest pulmonary embolus. 2. Aortic Atherosclerosis (ICD10-I70.0) and Emphysema (ICD10-J43.9). Coronary atherosclerosis. 3. Mild cardiomegaly with small pericardial effusion. 4. Airway thickening is present, suggesting bronchitis or reactive airways disease. 5. Perihepatic and perisplenic ascites with low-grade diffuse subcutaneous edema, query third spacing of fluid. 6. Postoperative findings in the subxiphoid region. Electronically Signed   By: Gaylyn Rong M.D.   On: 06/11/2017 19:53   Ct Angio Ao+bifem W & Or Wo Contrast  Result Date: 06/11/2017 CLINICAL DATA:  62 year old male with a reported history of recent bilateral femoral artery surgery. Evaluate for dissection or other complication. EXAM: CT ANGIOGRAPHY OF ABDOMINAL AORTA WITH ILIOFEMORAL RUNOFF TECHNIQUE: Multidetector CT imaging of the abdomen, pelvis and lower extremities was performed using the standard protocol during bolus administration of intravenous contrast. Multiplanar CT image reconstructions and MIPs were obtained to evaluate the vascular anatomy. CONTRAST:  ISOVUE-370 IOPAMIDOL (ISOVUE-370) INJECTION 76% COMPARISON:  Prior CTA runoff 04/29/2010 FINDINGS: VASCULAR Aorta: Heterogeneous atherosclerotic plaque throughout the abdominal aorta. Surgical changes of aorto bi femoral bypass graft. The aortic  anastomosis is widely patent without evidence of complication. Celiac: Patent without evidence of aneurysm, dissection, vasculitis or significant stenosis. SMA: Patent without evidence of aneurysm, dissection, vasculitis or significant stenosis. Renals: Solitary renal arteries bilaterally. No evidence of significant stenosis, fibromuscular dysplasia, dissection or aneurysm. There is atherosclerotic plaque at the renal  artery origins bilaterally. IMA: Thrombosed secondary to aortic bypass. The distal branches reconstitute via collateral flow. RIGHT Lower Extremity Inflow: Thrombosed native iliac vessels. Widely patent aortic to right femoral bypass graft. Outflow: The profunda femoral branches remain widely patent. However, the superficial femoral artery is occluded throughout the abductor canal. The vessel reconstitutes as the above the knee popliteal artery secondary to profunda femoral collaterals. The P2 and P3 segments of the popliteal artery are diseased with bulky atherosclerotic vascular calcifications. There are likely multifocal mild to moderate stenoses. Runoff: Calcified atherosclerotic plaque throughout the tibioperoneal trunk. There appears to be patent 3 vessel runoff to the ankle. LEFT Lower Extremity Inflow: Thrombosed native iliac vessels. Widely patent aortic to left common femoral bypass graft. Outflow: Common femoral artery is widely patent. The profunda femoral branches are also widely patent. However, the native superficial femoral artery is completely thrombosed but reconstitutes via collateral flow in the region of the abductor canal. The popliteal artery is diseased with multifocal areas of mild to moderate stenosis secondary to predominantly calcified atherosclerotic plaque. Runoff: Moderate focal stenosis at the origin of the anterior tibial artery. The tibioperoneal trunk is also diseased with a focal moderate to high-grade stenosis. However, there is patent 3 vessel runoff to the ankle.  Veins: No focal venous abnormality identified. Review of the MIP images confirms the above findings. NON-VASCULAR Lower chest: Mild dependent atelectasis in the lower lobes. No pleural effusion. No suspicious pulmonary mass or nodule. Borderline left ventricular dilatation. Incompletely imaged right atrial dilatation. Significant reflux of injected contrast material into the super hepatic IVC and hepatic veins raising concern for underlying right heart strain. Unremarkable distal thoracic esophagus. Hepatobiliary: Normal hepatic contour and morphology. No discrete hepatic lesions. Normal appearance of the gallbladder. No intra or extrahepatic biliary ductal dilatation. Pancreas: Unremarkable. No pancreatic ductal dilatation or surrounding inflammatory changes. Spleen: Normal in size without focal abnormality. Adrenals/Urinary Tract: Adrenal glands are unremarkable. Kidneys are normal, without renal calculi, focal lesion, or hydronephrosis. Bladder is unremarkable. Stomach/Bowel: No focal bowel wall thickening or evidence of obstruction. Lymphatic: No suspicious lymphadenopathy. Reproductive: Prostate is unremarkable. Other: Diffuse mesenteric edema. Small volume ascites. Small volume intermediate attenuation fluid in the region of the bilateral femoral artery cutdowns is not unexpected in the postoperative period. No evidence of unexpected fluid collection to suggest abscess or significant hematoma. Musculoskeletal: No acute fracture or aggressive appearing lytic or blastic osseous lesion. IMPRESSION: VASCULAR 1. Surgical changes of aortobifemoral bypass graft without evidence of complication. Expected postsurgical stranding and small fluid collections around the bilateral femoral arterial anastomoses. 2. 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 The profunda femoral arteries remain patent and reconstitute the outflow at the level of the P1 segment of the popliteal artery on the  right, and in the distal SFA in the region of the abductor canal on the left. 3. Bilateral popliteal artery disease with multifocal mild and moderate stenoses secondary to predominantly calcified atherosclerotic plaque. 4. Calcified atherosclerotic plaque noted throughout the runoff territories bilaterally. However, there does appear to be patent 3 vessel runoff to the ankle bilaterally. NON-VASCULAR 1. Right atrial dilatation with significant reflux of contrast material into the super hepatic IVC and hepatic veins. This suggests the possibility of underlying right heart strain/elevated right heart pressures. 2. Borderline left ventricular dilatation. Does the patient have a history of CHF? 3. Diffuse mild mesenteric edema and small volume ascites. This may be secondary to postoperative changes, third-spacing, or volume overload. 4. L5-S1 degenerative disc disease.  Signed, Sterling Big, MD [facility] Radiology Electronically Signed   By: Malachy Moan M.D.   On: 06/11/2017 17:46    Procedures Procedures (including critical care time)  Medications Ordered in ED Medications  aspirin EC tablet 81 mg (81 mg Oral Not Given 06/11/17 2130)  mometasone-formoterol (DULERA) 200-5 MCG/ACT inhaler 2 puff (2 puffs Inhalation Not Given 06/11/17 2133)  folic acid (FOLVITE) tablet 1 mg (1 mg Oral Not Given 06/11/17 2131)  furosemide (LASIX) tablet 20 mg (20 mg Oral Not Given 06/11/17 2131)  pantoprazole (PROTONIX) EC tablet 40 mg (40 mg Oral Not Given 06/11/17 2132)  albuterol (PROVENTIL) (2.5 MG/3ML) 0.083% nebulizer solution 2.5 mg (has no administration in time range)  albuterol (PROVENTIL) (2.5 MG/3ML) 0.083% nebulizer solution 3 mL (has no administration in time range)  morphine 4 MG/ML injection 4 mg (4 mg Intravenous Given 06/11/17 1509)  ondansetron (ZOFRAN) injection 4 mg (4 mg Intravenous Given 06/11/17 1509)  iopamidol (ISOVUE-370) 76 % injection 100 mL (100  mLs Intravenous Contrast Given 06/11/17 1657)  iopamidol (ISOVUE-370) 76 % injection 100 mL (75 mLs Intravenous Contrast Given 06/11/17 1931)  0.9 %  sodium chloride infusion ( Intravenous Stopped 06/11/17 2215)     Initial Impression / Assessment and Plan / ED Course  I have reviewed the triage vital signs and the nursing notes.  Pertinent labs & imaging results that were available during my care of the patient were reviewed by me and considered in my medical decision making (see chart for details).  Clinical Course as of Jun 13 42  Fri Jun 11, 2017  3556 62 year old male who on 26 March had an aortobifem by Dr. Imogene Burn here with 4 days of low abdominal pain.  States his been moving his bowels adequately but he has not really been eating much because of the throat pain postoperative.  No fever.  Ears get a midline stapled incision that clean dry intact with no erythema.  The abdomen is soft but he is got some left lower quadrant tenderness.  There is no guarding or rebound.  He is getting lab work and likely will need some CT imaging.  Got a call into vascular to clarify  appropriate imaging modality   [MB]    Clinical Course User Index [MB] Terrilee Files, MD   Rechecked patient and sats are 84-87% on room air.  He is tachycardic in the 115-120 range.  2 L nasal cannula applied.  Rechecked pt. Still continues to be tachycardic to 108. Satting at 96% on RA.   6:45 PM CONSULT with Dr. Myra Gianotti with vascular surgery, who feels that pt likely does not have mesenteric ischemia based on CT read. Recommended emergent cta pe protocol given tachycardia and low O2 sats. Can initiate heparin if PE found. Advised admission to [place] and will consult on pt.  8:58 PM CONSULT with Dr. Ashok Pall, who states pt will be accepted to [facility] pending bed availability.   Final Clinical Impressions(s) / ED Diagnoses   Final diagnoses:  Respiratory failure with hypoxia, unspecified chronicity (HCC)  Acute on  chronic congestive heart failure, unspecified heart failure type [facility])   Patient with left lower quadrant abdominal pain.  Unclear etiology at this point.  Lab work with mild hypokalemia and hypochloremia.  Normal creatinine.  Low albumin consistent with decreased p.o. Intake following Surgery.  CBC with elevated platelets 04/12/1962, but no leukocytosis noted.  Lipase normal.  UA without evidence of UTI.  CT angios aorto in by family with contrast did find mild diffuse mesenteric edema and small volume ascites.  However finding is nonspecific and could be secondary to postop changes, third spacing, or volume overload.  No evidence of diverticulitis or colitis.  CT also found right atrial dilatation with significant reflux contrast into superior IVC and hepatic veins suggesting underlying right heart strain/elevated right heart pressures.  Also with borderline left ventricular dilatation.  Patient does have history of CHF that was diagnosed on recent admission after surgery.  Was found to have pulmonary hypertension as well.  Unclear if findings today are chronic or worsening, however BNP is elevated to 800.  Patient does not appear to be grossly fluid overloaded on exam, no lower extremity edema.  Pulmonary exam without crackles suggesting pulmonary edema.  Patient desatted in the 80s and became tachycardic to the 115-120 range while in the ED.  Given suggestive of right heart strain on CT scan and recent surgery, there was concern for pulmonary embolus.  CTA PE study was ordered and negative for PE.  Did note pericardial effusion.  Did notice airway thickening and perihepatic and Splenic ascites with low-grade diffuse subcutaneous edema.  Patient will require admission for further workup and treatment of his acute respiratory failure with hypoxia as well as acute heart failure.  ED Discharge Orders    None       Karrie Meres, [place] 06/12/17 0044    Terrilee Files, MD 06/12/17 1126

## 2017-06-11 NOTE — Telephone Encounter (Signed)
Returned call to patient. Stated he had been having "severe" stomach pain for the past 4 days with loose stools. No nausea or vomiting. States abdomen is hard but incisions look good with no redness, swelling or drainage. He has no fever and rates pain as a 6. He has taken Milk of Magnesia as instructed in his post-op instructions. Instructed patient it may be best to go to PCP, urgent care or ER to be seen. Call us again if needed.

## 2017-06-11 NOTE — ED Notes (Signed)
Paged hospitalist for [facility] via [facility] per Dr. Samson Frederic @ 8:30 pm

## 2017-06-11 NOTE — ED Notes (Signed)
Pt placed on 3L [place]- SpO2 90%.

## 2017-06-11 NOTE — ED Triage Notes (Signed)
Abdominal pain. He had femoral artery surgery 2 weeks ago. His MD told him to come here for evaluation.

## 2017-06-11 NOTE — ED Notes (Signed)
Patient returned from CT

## 2017-06-12 DIAGNOSIS — J449 Chronic obstructive pulmonary disease, unspecified: Secondary | ICD-10-CM

## 2017-06-12 DIAGNOSIS — K5900 Constipation, unspecified: Secondary | ICD-10-CM | POA: Diagnosis present

## 2017-06-12 DIAGNOSIS — I10 Essential (primary) hypertension: Secondary | ICD-10-CM

## 2017-06-12 DIAGNOSIS — J9691 Respiratory failure, unspecified with hypoxia: Secondary | ICD-10-CM

## 2017-06-12 DIAGNOSIS — R1032 Left lower quadrant pain: Secondary | ICD-10-CM | POA: Diagnosis not present

## 2017-06-12 LAB — BASIC METABOLIC PANEL
Anion gap: 10 (ref 5–15)
CO2: 25 mmol/L (ref 22–32)
CREATININE: 0.71 mg/dL (ref 0.61–1.24)
Calcium: 8.4 mg/dL — ABNORMAL LOW (ref 8.9–10.3)
Chloride: 101 mmol/L (ref 101–111)
Glucose, Bld: 125 mg/dL — ABNORMAL HIGH (ref 65–99)
POTASSIUM: 3.5 mmol/L (ref 3.5–5.1)
SODIUM: 136 mmol/L (ref 135–145)

## 2017-06-12 LAB — CBC
HCT: 36.8 % — ABNORMAL LOW (ref 39.0–52.0)
Hemoglobin: 12.8 g/dL — ABNORMAL LOW (ref 13.0–17.0)
MCH: 34.6 pg — AB (ref 26.0–34.0)
MCHC: 34.8 g/dL (ref 30.0–36.0)
MCV: 99.5 fL (ref 78.0–100.0)
PLATELETS: 414 10*3/uL — AB (ref 150–400)
RBC: 3.7 MIL/uL — ABNORMAL LOW (ref 4.22–5.81)
RDW: 14.1 % (ref 11.5–15.5)
WBC: 6.4 10*3/uL (ref 4.0–10.5)

## 2017-06-12 LAB — MAGNESIUM: MAGNESIUM: 2 mg/dL (ref 1.7–2.4)

## 2017-06-12 MED ORDER — SILDENAFIL CITRATE 20 MG PO TABS
20.0000 mg | ORAL_TABLET | Freq: Three times a day (TID) | ORAL | Status: DC
  Administered 2017-06-12 (×3): 20 mg via ORAL
  Filled 2017-06-12 (×3): qty 1

## 2017-06-12 MED ORDER — THIAMINE HCL 100 MG PO TABS: 100.0000 mg | ORAL_TABLET | Freq: Every day | ORAL | 0 refills | Status: DC

## 2017-06-12 MED ORDER — ENOXAPARIN SODIUM 40 MG/0.4ML ~~LOC~~ SOLN
40.0000 mg | SUBCUTANEOUS | Status: DC
  Administered 2017-06-12: 40 mg via SUBCUTANEOUS
  Filled 2017-06-12: qty 0.4

## 2017-06-12 MED ORDER — POLYETHYLENE GLYCOL 3350 17 G PO PACK: 17.0000 g | PACK | Freq: Every day | ORAL | 0 refills | Status: DC

## 2017-06-12 MED ORDER — SENNOSIDES-DOCUSATE SODIUM 8.6-50 MG PO TABS: 1.0000 | ORAL_TABLET | Freq: Two times a day (BID) | ORAL | Status: DC

## 2017-06-12 MED ORDER — MOMETASONE FURO-FORMOTEROL FUM 200-5 MCG/ACT IN AERO
2.0000 | INHALATION_SPRAY | Freq: Two times a day (BID) | RESPIRATORY_TRACT | Status: DC
  Administered 2017-06-12: 2 via RESPIRATORY_TRACT
  Filled 2017-06-12: qty 8.8

## 2017-06-12 MED ORDER — SORBITOL 70 % SOLN
30.0000 mL | Freq: Once | Status: AC
  Administered 2017-06-12: 30 mL via ORAL
  Filled 2017-06-12: qty 30

## 2017-06-12 MED ORDER — POLYETHYLENE GLYCOL 3350 17 G PO PACK
17.0000 g | PACK | Freq: Once | ORAL | Status: AC
  Administered 2017-06-12: 17 g via ORAL
  Filled 2017-06-12: qty 1

## 2017-06-12 MED ORDER — LACTULOSE 10 GM/15ML PO SOLN: 30.0000 g | ORAL | Status: DC

## 2017-06-12 MED ORDER — ALBUTEROL SULFATE (2.5 MG/3ML) 0.083% IN NEBU: 3.0000 mL | INHALATION_SOLUTION | Freq: Four times a day (QID) | RESPIRATORY_TRACT | Status: DC | PRN

## 2017-06-12 MED ORDER — VITAMIN B-1 100 MG PO TABS
100.0000 mg | ORAL_TABLET | Freq: Every day | ORAL | Status: DC
Start: 2017-06-12 — End: 2017-06-13
  Administered 2017-06-12: 100 mg via ORAL
  Filled 2017-06-12: qty 1

## 2017-06-12 MED ORDER — POTASSIUM CHLORIDE CRYS ER 20 MEQ PO TBCR
40.0000 meq | EXTENDED_RELEASE_TABLET | Freq: Once | ORAL | Status: AC
  Administered 2017-06-12: 40 meq via ORAL
  Filled 2017-06-12: qty 2

## 2017-06-12 MED ORDER — GI COCKTAIL ~~LOC~~
30.0000 mL | Freq: Once | ORAL | Status: AC
  Administered 2017-06-12: 30 mL via ORAL
  Filled 2017-06-12: qty 30

## 2017-06-12 MED ORDER — NICOTINE 21 MG/24HR TD PT24
21.0000 mg | MEDICATED_PATCH | Freq: Every day | TRANSDERMAL | Status: DC
  Administered 2017-06-12: 21 mg via TRANSDERMAL
  Filled 2017-06-12: qty 1

## 2017-06-12 NOTE — Discharge Summary (Signed)
Physician Discharge Summary  Wayne [name] [name]:[id number] DOB: 1955/06/23 DOA: 06/11/2017  PCP: Elinor Dodge, MD  Admit date: 06/11/2017 Discharge date: 06/12/2017  Time spent: 55 minutes  Recommendations for Outpatient Follow-up:  1. Follow-up with Elinor Dodge, MD in 2 weeks.  On follow-up patient will need a basic metabolic profile done to follow-up on electrolytes and renal function.  Patient's abdominal pain will need to be reassessed. 2. Follow-up with Dr. Imogene Burn, vascular surgery as scheduled.   Discharge Diagnoses:  Active Problems:   Chronic systolic heart failure (HCC)   Peripheral vascular disease (HCC)   Tobacco abuse   COPD (chronic obstructive pulmonary disease) (HCC)   Hypertension   Abdominal pain, acute, left lower quadrant   Respiratory failure with hypoxia (HCC)   Acute left lower quadrant pain   Constipation   Discharge Condition: Stable and improved  Diet recommendation: Heart healthy  Filed Weights   06/11/17 1425 06/11/17 2333  Weight: 47.6 kg (105 lb) 47.9 kg (105 lb 9.6 oz)    History of present illness:  Per Dr Sherrilee Gilles is a 62 y.o. male with medical history significant for severe PAD with recent (3/26) aortofemoral bypass graft surgery, hospital stay complicated by severe pHTN causing CHF trated with sildenafil and lasix as well as altered menteal status post-op and intermittent hypoxia, also smoking, alcohol use (currently 1 drink/day), copd, CHF, who was discharged 4 days prior to admission, presented to outside ED complaining of 3-4 days mild left lower quadrant abdominal pain. Constant, not associated with movement or eating. No nausea or vomiting. No dysuria or hematuria or urgency. No diarrhea or constipation. No skin changes. No abdominal distention. No cough or shortness of breath or worsening doe.   ED Course: CT angiogram, labs, morphine.    Hospital Course:  #1 left lower quadrant abdominal pain  likely secondary to constipation Patient at present left lower quadrant abdominal pain due to his recent aortofemoral bypass surgery, history of severe peripheral arterial disease patient was admitted for further evaluation and workup by vascular surgery.  Patient denied any diarrhea.  On further questioning it is noted that patient has had some hard small stools during his prior hospitalization and on discharge.  Patient did not have any urinary symptoms.  Urinalysis was unremarkable.  CT angiogram chest which was done in the ED on admission was negative for PE, consistent with emphysema and coronary atherosclerosis, mild cardiomegaly with small pericardial effusion, airway thickening suggesting bronchitis or reactive airways disease, perihepatic and perisplenic ascites with low-grade diffuse subcutaneous edema, postop findings in the subxiphoid region.  Patient subsequently underwent CT angiogram of abdominal aorta with iliofemoral runoff which was reviewed by vascular surgery and felt it did not show any complicating features related to his recent surgery.  Vascular surgery consulted on the patient as well.  It was felt that intestinal ischemia could be seen after aortofemoral bypass however unusual for it to be seen this far out and per vascular surgery would have expected patient to have bloody stools.  Vascular surgery had recommended a GI cocktail as needed. On further assessment of patient and due to negative workup to date it was felt that patient's symptoms may be related to constipation.  Patient was given some MiraLAX as well as sorbitol with good results and significant bowel movement and clinical improvement per patient.  Patient was insistent on being discharged and patient will be discharged home in stable and improved condition.  Will be discharged on a  bowel regimen of MiraLAX daily as well as Senokot-S twice daily.  Outpatient follow-up with PCP.  2.  COPD Remained stable throughout the  hospitalization.  Outpatient follow-up.  3.  Hypoxia/history of hypoxic respiratory failure It was noted per admitting MD that patient sats went below 90% in the ED at the [facility].  Patient did not have any wheezing or any symptoms of a COPD exacerbation.  It was felt patient was likely intermittently hypoxic at baseline.  It is noted to have a history of recently diagnosed severe pulmonary hypertension however on exam patient is euvolemic although his BNP was elevated at 800 which was decreased from 1280 a month ago.  Patient was maintained on home regimen of Lasix and sliding of silk.  Patient was satting 98% on 2 L during the hospitalization.  Patient remained asymptomatic.  Patient will be discharged home in stable and improved condition.  Patient has close outpatient follow-up with cardiology, Dr.Bensimhon on Monday, 06/14/2017 where his pulmonary hypertension will be reassessed and followed up upon.  4.  Hypertension Stable.  Rest of patient's chronic medical issues remained stable throughout the hospitalization and patient be discharged in stable and improved condition.   Procedures:  CT angiogram chest 06/11/2017  CT angiogram abdominal aorta with iliofemoral runoff 06/11/2017  Consultations:  Vascular surgery: Dr. Myra Gianotti 06/12/2017  Discharge Exam: Vitals:   06/12/17 0911 06/12/17 1158  BP:  120/73  Pulse:  97  Resp:  20  Temp:  98.2 F (36.8 C)  SpO2: 99% 98%    General: NAD Cardiovascular: RRR Respiratory: CTAB  Discharge Instructions   Discharge Instructions    Diet - low sodium heart healthy   Complete by:  As directed    Increase activity slowly   Complete by:  As directed      Allergies as of 06/12/2017      Reactions   Penicillins Itching, Rash, Other (See Comments)   PATIENT HAS HAD A PCN REACTION WITH IMMEDIATE RASH, FACIAL/TONGUE/THROAT SWELLING, SOB, OR LIGHTHEADEDNESS WITH HYPOTENSION:  #  #  #  YES  #  #  #   Has patient had a PCN reaction  causing severe rash involving mucus membranes or skin necrosis: No Has patient had a PCN reaction that required hospitalization: No Has patient had a PCN reaction occurring within the last 10 years: No If all of the above answers are "NO", then may proceed with Cephalosporin use.      Medication List    TAKE these medications   acetaminophen 500 MG tablet Commonly known as:  TYLENOL Take 2 tablets (1,000 mg total) by mouth every 6 (six) hours as needed. What changed:    how much to take  when to take this  reasons to take this   albuterol 108 (90 Base) MCG/ACT inhaler Commonly known as:  PROVENTIL HFA;VENTOLIN HFA Inhale 2 puffs into the lungs every 6 (six) hours as needed for wheezing or shortness of breath.   aspirin EC 81 MG tablet Take 81 mg by mouth daily.   atorvastatin 20 MG tablet Commonly known as:  LIPITOR Take 20 mg by mouth daily.   budesonide-formoterol 160-4.5 MCG/ACT inhaler Commonly known as:  SYMBICORT Inhale 2 puffs into the lungs 2 (two) times daily.   furosemide 20 MG tablet Commonly known as:  LASIX Take 1 tablet (20 mg total) by mouth daily.   multivitamin with minerals Tabs tablet Take 1 tablet by mouth daily.   nicotine 21 mg/24hr patch  Commonly known as:  NICODERM CQ - dosed in mg/24 hours Place 1 patch (21 mg total) onto the skin daily.   omeprazole 20 MG capsule Commonly known as:  PRILOSEC Take 1 capsule (20 mg total) by mouth daily as needed (for indegestion).   ondansetron 4 MG disintegrating tablet Commonly known as:  ZOFRAN ODT Take 1 tablet (4 mg total) by mouth every 8 (eight) hours as needed for nausea or vomiting. What changed:  reasons to take this   polyethylene glycol packet Commonly known as:  MIRALAX Take 17 g by mouth daily.   senna-docusate 8.6-50 MG tablet Commonly known as:  SENOKOT S Take 1 tablet by mouth 2 (two) times daily.   sildenafil 20 MG tablet Commonly known as:  REVATIO Take 1 tablet (20 mg total)  by mouth 3 (three) times daily.   thiamine 100 MG tablet Take 1 tablet (100 mg total) by mouth daily.   traMADol 50 MG tablet Commonly known as:  ULTRAM Take 1 tablet (50 mg total) by mouth every 6 (six) hours as needed for up to 10 doses.      Allergies  Allergen Reactions  . Penicillins Itching, Rash and Other (See Comments)    PATIENT HAS HAD A PCN REACTION WITH IMMEDIATE RASH, FACIAL/TONGUE/THROAT SWELLING, SOB, OR LIGHTHEADEDNESS WITH HYPOTENSION:  #  #  #  YES  #  #  #   Has patient had a PCN reaction causing severe rash involving mucus membranes or skin necrosis: No Has patient had a PCN reaction that required hospitalization: No Has patient had a PCN reaction occurring within the last 10 years: No If all of the above answers are "NO", then may proceed with Cephalosporin use.    Follow-up Information    Sanchez-Brugal, Rockey Situ, MD. Schedule an appointment as soon as possible for a visit in 2 week(s).   Specialty:  Internal Medicine Contact information: [address] [place] [address] [phone]        Fransisco Hertz, MD Follow up.   Specialties:  Vascular Surgery, Cardiology Why:  Follow-up as scheduled Contact information: [address] [place] [address] [phone]            The results of significant diagnostics from this hospitalization (including imaging, microbiology, ancillary and laboratory) are listed below for reference.    Significant Diagnostic Studies: Ct Angio Chest Pe W And/or Wo Contrast  Result Date: 06/11/2017 CLINICAL DATA:  Abdominal pain.  Recent femoral artery surgery. EXAM: CT ANGIOGRAPHY CHEST WITH CONTRAST TECHNIQUE: Multidetector CT imaging of the chest was performed using the standard protocol during bolus administration of intravenous contrast. Multiplanar CT image reconstructions and MIPs were obtained to evaluate the vascular anatomy. CONTRAST:  75mL ISOVUE-370 IOPAMIDOL (ISOVUE-370) INJECTION 76%. Reduced dose was  authorized, the patient had a prior contrast enhanced exam today. COMPARISON:  05/11/2017 CT chest FINDINGS: Cardiovascular: Coronary, aortic arch, and branch vessel atherosclerotic vascular disease. Mild cardiomegaly with small pericardial effusion. No filling defect is identified in the pulmonary arterial tree to suggest pulmonary embolus. Mediastinum/Nodes: Right infrahilar lymph node 0.6 cm in short axis on image 55/4. No overt pathologic adenopathy in the chest. Lungs/Pleura: Severe centrilobular emphysema. Mild airway thickening. Stable 3 by 5 mm subpleural lymph node along the right lower lobe side of the major fissure on image 57/5. Upper Abdomen: Perihepatic and perisplenic ascites. Musculoskeletal: There is a suggestion of low-grade diffuse subcutaneous edema. Sub xiphoid skin clips with a fluid collection in the subxiphoid region,  correlate with recent operative intervention. Lower cervical spondylosis. Review of the MIP images confirms the above findings. IMPRESSION: 1. No filling defect is identified in the pulmonary arterial tree to suggest pulmonary embolus. 2. Aortic Atherosclerosis (ICD10-I70.0) and Emphysema (ICD10-J43.9). Coronary atherosclerosis. 3. Mild cardiomegaly with small pericardial effusion. 4. Airway thickening is present, suggesting bronchitis or reactive airways disease. 5. Perihepatic and perisplenic ascites with low-grade diffuse subcutaneous edema, query third spacing of fluid. 6. Postoperative findings in the subxiphoid region. Electronically Signed   By: Gaylyn Rong M.D.   On: 06/11/2017 19:53   Ct Angio Ao+bifem W & Or Wo Contrast  Result Date: 06/11/2017 CLINICAL DATA:  62 year old male with a reported history of recent bilateral femoral artery surgery. Evaluate for dissection or other complication. EXAM: CT ANGIOGRAPHY OF ABDOMINAL AORTA WITH ILIOFEMORAL RUNOFF TECHNIQUE: Multidetector CT imaging of the abdomen, pelvis and lower extremities was performed using the  standard protocol during bolus administration of intravenous contrast. Multiplanar CT image reconstructions and MIPs were obtained to evaluate the vascular anatomy. CONTRAST:  ISOVUE-370 IOPAMIDOL (ISOVUE-370) INJECTION 76% COMPARISON:  Prior CTA runoff 04/29/2010 FINDINGS: VASCULAR Aorta: Heterogeneous atherosclerotic plaque throughout the abdominal aorta. Surgical changes of aorto bi femoral bypass graft. The aortic anastomosis is widely patent without evidence of complication. Celiac: Patent without evidence of aneurysm, dissection, vasculitis or significant stenosis. SMA: Patent without evidence of aneurysm, dissection, vasculitis or significant stenosis. Renals: Solitary renal arteries bilaterally. No evidence of significant stenosis, fibromuscular dysplasia, dissection or aneurysm. There is atherosclerotic plaque at the renal artery origins bilaterally. IMA: Thrombosed secondary to aortic bypass. The distal branches reconstitute via collateral flow. RIGHT Lower Extremity Inflow: Thrombosed native iliac vessels. Widely patent aortic to right femoral bypass graft. Outflow: The profunda femoral branches remain widely patent. However, the superficial femoral artery is occluded throughout the abductor canal. The vessel reconstitutes as the above the knee popliteal artery secondary to profunda femoral collaterals. The P2 and P3 segments of the popliteal artery are diseased with bulky atherosclerotic vascular calcifications. There are likely multifocal mild to moderate stenoses. Runoff: Calcified atherosclerotic plaque throughout the tibioperoneal trunk. There appears to be patent 3 vessel runoff to the ankle. LEFT Lower Extremity Inflow: Thrombosed native iliac vessels. Widely patent aortic to left common femoral bypass graft. Outflow: Common femoral artery is widely patent. The profunda femoral branches are also widely patent. However, the native superficial femoral artery is completely thrombosed but  reconstitutes via collateral flow in the region of the abductor canal. The popliteal artery is diseased with multifocal areas of mild to moderate stenosis secondary to predominantly calcified atherosclerotic plaque. Runoff: Moderate focal stenosis at the origin of the anterior tibial artery. The tibioperoneal trunk is also diseased with a focal moderate to high-grade stenosis. However, there is patent 3 vessel runoff to the ankle. Veins: No focal venous abnormality identified. Review of the MIP images confirms the above findings. NON-VASCULAR Lower chest: Mild dependent atelectasis in the lower lobes. No pleural effusion. No suspicious pulmonary mass or nodule. Borderline left ventricular dilatation. Incompletely imaged right atrial dilatation. Significant reflux of injected contrast material into the super hepatic IVC and hepatic veins raising concern for underlying right heart strain. Unremarkable distal thoracic esophagus. Hepatobiliary: Normal hepatic contour and morphology. No discrete hepatic lesions. Normal appearance of the gallbladder. No intra or extrahepatic biliary ductal dilatation. Pancreas: Unremarkable. No pancreatic ductal dilatation or surrounding inflammatory changes. Spleen: Normal in size without focal abnormality. Adrenals/Urinary Tract: Adrenal glands are unremarkable. Kidneys are normal, without renal calculi, focal  lesion, or hydronephrosis. Bladder is unremarkable. Stomach/Bowel: No focal bowel wall thickening or evidence of obstruction. Lymphatic: No suspicious lymphadenopathy. Reproductive: Prostate is unremarkable. Other: Diffuse mesenteric edema. Small volume ascites. Small volume intermediate attenuation fluid in the region of the bilateral femoral artery cutdowns is not unexpected in the postoperative period. No evidence of unexpected fluid collection to suggest abscess or significant hematoma. Musculoskeletal: No acute fracture or aggressive appearing lytic or blastic osseous lesion.  IMPRESSION: VASCULAR 1. Surgical changes of aortobifemoral bypass graft without evidence of complication. Expected postsurgical stranding and small fluid collections around the bilateral femoral arterial anastomoses. 2. 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 The profunda femoral arteries remain patent and reconstitute the outflow at the level of the P1 segment of the popliteal artery on the right, and in the distal SFA in the region of the abductor canal on the left. 3. Bilateral popliteal artery disease with multifocal mild and moderate stenoses secondary to predominantly calcified atherosclerotic plaque. 4. Calcified atherosclerotic plaque noted throughout the runoff territories bilaterally. However, there does appear to be patent 3 vessel runoff to the ankle bilaterally. NON-VASCULAR 1. Right atrial dilatation with significant reflux of contrast material into the super hepatic IVC and hepatic veins. This suggests the possibility of underlying right heart strain/elevated right heart pressures. 2. Borderline left ventricular dilatation. Does the patient have a history of CHF? 3. Diffuse mild mesenteric edema and small volume ascites. This may be secondary to postoperative changes, third-spacing, or volume overload. 4. L5-S1 degenerative disc disease. Signed, Sterling Big, MD [facility] Radiology Electronically Signed   By: Malachy Moan M.D.   On: 06/11/2017 17:46   Dg Chest Portable 1 View  Result Date: 06/03/2017 CLINICAL DATA:  Respiratory failure. EXAM: PORTABLE CHEST 1 VIEW COMPARISON:  06/02/2017 FINDINGS: Cardiomegaly, NG tube entering the stomach with tip off the field of view again noted. Swan-Ganz catheter has been removed with remaining RIGHT IJ central venous catheter sheath. Mild RIGHT basilar atelectasis has slightly increased. There is no evidence of pneumothorax. No other changes identified.  IMPRESSION: Swan-Ganz catheter removal and slight increase in RIGHT basilar atelectasis. No other significant change. Electronically Signed   By: Harmon Pier M.D.   On: 06/03/2017 08:18   Dg Chest Port 1 View  Result Date: 06/02/2017 CLINICAL DATA:  Post aortobifemoral bypass grafting, smoker, CHF, COPD, hypertension, coronary artery disease post MI EXAM: PORTABLE CHEST 1 VIEW COMPARISON:  Portable exam 0504 hours compared to 06/01/2017 FINDINGS: Tip of Swan-Ganz catheter projects over RIGHT pulmonary artery at pulmonary hilum. Nasogastric tube extends into stomach. Enlargement of cardiac silhouette with pulmonary vascular congestion. Atherosclerotic calcification aorta. Mild perihilar infiltrates likely minimal pulmonary edema. No pleural effusion or pneumothorax. IMPRESSION: Enlargement of cardiac silhouette with pulmonary vascular congestion and pulmonary edema, slightly increased. Electronically Signed   By: Ulyses Southward M.D.   On: 06/02/2017 09:45   Dg Chest Port 1 View  Result Date: 06/01/2017 CLINICAL DATA:  Aspiration EXAM: PORTABLE CHEST 1 VIEW COMPARISON:  06/01/2017, 05/11/2017 FINDINGS: Esophageal tube tip extends below the diaphragm. Right IJ Swan-Ganz catheter tip advanced, now projects over right inter lobar pulmonary artery. No focal opacity or pleural effusion. Stable slightly enlarged cardiomediastinal silhouette with mild central congestion. Mild diffuse interstitial opacities likely reflect minimal edema. Overall improved since prior study. No pneumothorax. IMPRESSION: 1. Right IJ Swan-Ganz catheter tip advanced and now projects over right inter lobar pulmonary artery 2. Cardiomegaly with vascular congestion and mild diffuse  interstitial opacity suspect for minimal edema, overall decreased compared with prior radiograph. Electronically Signed   By: Jasmine Pang M.D.   On: 06/01/2017 20:21   Dg Chest Port 1 View  Result Date: 06/01/2017 CLINICAL DATA:  Post aortobifem bypass graft  EXAM: PORTABLE CHEST 1 VIEW COMPARISON:  CT angio chest abdomen pelvis of 05/11/2017 FINDINGS: Very prominent interstitial markings are noted throughout the lungs. Some of these markings may be chronic, but superimposed interstitial edema is a consideration. No definite pleural effusion is noted. The heart is mildly enlarged. Swan-Ganz catheter tip is noted within the left main pulmonary artery. NG tube extends below the hemidiaphragm. IMPRESSION: 1. Very prominent interstitial markings some of which may be chronic, but superimposed interstitial edema cannot be excluded. No definite effusion. 2. Stable cardiomegaly with Swan-Ganz catheter tip within the left main pulmonary artery. Electronically Signed   By: Dwyane Dee M.D.   On: 06/01/2017 13:51   Dg Abd Portable 1v  Result Date: 06/01/2017 CLINICAL DATA:  NG tube placement EXAM: PORTABLE ABDOMEN - 1 VIEW COMPARISON:  06/01/2017 FINDINGS: Cutaneous abdominal staples. Esophageal tube tip projects over the body of the stomach, side-port over the proximal stomach. Visible lung bases are clear. IMPRESSION: Enteric tube tip projects over the mid to distal stomach. Electronically Signed   By: Jasmine Pang M.D.   On: 06/01/2017 20:20   Dg Abd Portable 1v  Result Date: 06/01/2017 CLINICAL DATA:  Status post aortobifemoral bypass graft. EXAM: PORTABLE ABDOMEN - 1 VIEW COMPARISON:  04/28/2017 CT abdomen/pelvis FINDINGS: Enteric tube terminates in the proximal stomach. Vertical skin staples are noted just to the left of the lumbar spine. Extensive aorto bi-iliac atherosclerotic calcifications are noted with right common iliac stent graft. No disproportionately dilated small bowel loops. No evidence of pneumatosis or pneumoperitoneum. Mild colonic stool. No radiopaque nephrolithiasis. IMPRESSION: 1. Enteric tube terminates in the proximal stomach. 2. Nonobstructive bowel gas pattern. Electronically Signed   By: Delbert Phenix M.D.   On: 06/01/2017 13:51     Microbiology: No results found for this or any previous visit (from the past 240 hour(s)).   Labs: Basic Metabolic Panel: Recent Labs  Lab 06/06/17 0248 06/07/17 1010 06/11/17 1502 06/12/17 0228  NA 132*  --  135 136  K 3.3*  --  3.3* 3.5  CL 100*  --  100* 101  CO2 24  --  24 25  GLUCOSE 122*  --  112* 125*  BUN <5*  --  5* <5*  CREATININE 0.56*  --  0.75 0.71  CALCIUM 8.0*  --  8.5* 8.4*  MG  --  1.8  --  2.0   Liver Function Tests: Recent Labs  Lab 06/11/17 1502  AST 28  ALT 17  ALKPHOS 106  BILITOT 0.7  PROT 6.7  ALBUMIN 2.9*   Recent Labs  Lab 06/11/17 1502  LIPASE 24   No results for input(s): AMMONIA in the last 168 hours. CBC: Recent Labs  Lab 06/06/17 0248 06/11/17 1502 06/12/17 0228  WBC 7.0 6.5 6.4  NEUTROABS  --  4.3  --   HGB 12.6* 13.7 12.8*  HCT 37.3* 39.8 36.8*  MCV 98.7 98.0 99.5  PLT 272 464* 414*   Cardiac Enzymes: No results for input(s): CKTOTAL, CKMB, CKMBINDEX, TROPONINI in the last 168 hours. BNP: BNP (last 3 results) Recent Labs    05/11/17 0808 06/11/17 1502  BNP 1,284.4* 840.2*    ProBNP (last 3 results) No results for input(s): PROBNP in the  last 8760 hours.  CBG: No results for input(s): GLUCAP in the last 168 hours.     Signed:  Ramiro Harvest MD.  [facility] 06/12/2017, 7:17 PM

## 2017-06-12 NOTE — H&P (Signed)
History and Physical    Wayne [name] UJW:[id number] DOB: 1955/05/22 DOA: 06/11/2017  PCP: Elinor Dodge, MD  Patient coming from: [facility]   Chief Complaint: abdominal pain  HPI: Wayne [name] is a 62 y.o. male with medical history significant for severe PAD with recent (3/26) aortofemoral bypass graft surgery, hospital stay complicated by severe pHTN causing CHF trated with sildenafil and lasix as well as altered menteal status post-op and intermittent hypoxia, also smoking, alcohol use (currently 1 drink/day), copd, CHF, who was discharged 4 days ago, presented to outside ED complaining of 3-4 days mild left lower quadrant abdominal pain. Constant, not associated with movement or eating. No nausea or vomiting. No dysuria or hematuria or urgency. No diarrhea or constipation. No skin changes. No abdominal distention. No cough or shortness of breath or worsening doe.   ED Course: CT angiogram, labs, morphine.  Review of Systems: As per HPI otherwise 10 point review of systems negative.    Past Medical History:  Diagnosis Date  . Abnormal pulmonary finding    a. preadmission note indicates pt followed by Dr. Bethanie Dicker for CHF, lung mass, COPD, pulmonary mycosis with plan for 3 mo f/u with CT by 03/2017 note.  . Alcohol abuse   . Anemia   . Arthritis    "right hip; lower back" (05/11/2017)  . Chronic systolic CHF (congestive heart failure) (HCC)    a. EF 20-25% by echo 2017. b. 40-45% by echo in 01/2017.  Marland Kitchen COPD (chronic obstructive pulmonary disease) (HCC)   . Dyspnea   . GERD (gastroesophageal reflux disease)   . History of blood transfusion 2004   "related to bleeding stomach ulcerts"  . History of stomach ulcers 2004  . Hypercholesterolemia   . Hypertension   . Malnutrition (HCC)   . Moderate mitral regurgitation   . Myocardial infarction [facility]) 2004   "think it was bleeding ulcers; not a heart attack" (05/11/2017), pt denies heart cath  . Peripheral vascular  disease (HCC)   . Pre-diabetes   . Pulmonary hypertension (HCC)    a. severe pulm HTN by echo 01/2017.  . Tricuspid regurgitation 2017    Past Surgical History:  Procedure Laterality Date  . ABDOMINAL AORTOGRAM W/LOWER EXTREMITY N/A 04/01/2017   Procedure: ABDOMINAL AORTOGRAM W/LOWER EXTREMITY;  Surgeon: Fransisco Hertz, MD;  Location: [facility] INVASIVE CV LAB;  Service: Cardiovascular;  Laterality: N/A;  Bilateral  . AORTA - BILATERAL FEMORAL ARTERY BYPASS GRAFT N/A 06/01/2017   Procedure: AORTA BIFEMORAL BYPASS GRAFT with Graft and Right Femoral Endartarectomy;  Surgeon: Fransisco Hertz, MD;  Location: [facility] OR;  Service: Vascular;  Laterality: N/A;  . COLONOSCOPY    . HEMORRHOIDECTOMY WITH HEMORRHOID BANDING    . MASS EXCISION Left 04/13/2017   Procedure: EXCISION EPIDERMAL INCLUSION CYST LEFT GROIN;  Surgeon: Fransisco Hertz, MD;  Location: [facility] OR;  Service: Vascular;  Laterality: Left;  . PERIPHERAL VASCULAR INTERVENTION Left    "groin; for clogged artery"     reports that he has been smoking cigarettes.  He has a 12.50 pack-year smoking history. He has never used smokeless tobacco. He reports that he drinks about 4.8 oz of alcohol per week. He reports that he does not use drugs.  Allergies  Allergen Reactions  . Penicillins Itching, Rash and Other (See Comments)    PATIENT HAS HAD A PCN REACTION WITH IMMEDIATE RASH, FACIAL/TONGUE/THROAT SWELLING, SOB, OR LIGHTHEADEDNESS WITH HYPOTENSION:  #  #  #  YES  #  #  #  Has patient had a PCN reaction causing severe rash involving mucus membranes or skin necrosis: No Has patient had a PCN reaction that required hospitalization: No Has patient had a PCN reaction occurring within the last 10 years: No If all of the above answers are "NO", then may proceed with Cephalosporin use.     Family History  Problem Relation Age of Onset  . Hypertension Sister   . Hyperlipidemia Sister   . Diabetes Sister   . Peripheral Artery Disease Father     Prior to  Admission medications   Medication Sig Start Date End Date Taking? Authorizing Provider  acetaminophen (TYLENOL) 500 MG tablet Take 2 tablets (1,000 mg total) by mouth every 6 (six) hours as needed. Patient taking differently: Take 250 mg by mouth daily as needed for moderate pain or headache.  02/17/16   Roxy Horseman, PA-C  albuterol (PROVENTIL HFA;VENTOLIN HFA) 108 (90 Base) MCG/ACT inhaler Inhale 2 puffs into the lungs every 6 (six) hours as needed for wheezing or shortness of breath.    [provider]  aspirin EC 81 MG tablet Take 81 mg by mouth daily.    [provider]  atorvastatin (LIPITOR) 20 MG tablet Take 20 mg by mouth daily.    [provider]  budesonide-formoterol (SYMBICORT) 160-4.5 MCG/ACT inhaler Inhale 2 puffs into the lungs 2 (two) times daily.    [provider]  doxycycline (VIBRAMYCIN) 50 MG capsule Take 1 capsule (50 mg total) by mouth 2 (two) times daily. Completion of hospital course Patient not taking: Reported on 05/18/2017 05/14/17   Edsel Petrin, DO  feeding supplement, ENSURE ENLIVE, (ENSURE ENLIVE) LIQD Take 237 mLs by mouth 2 (two) times daily between meals. Patient not taking: Reported on 05/18/2017 05/14/17   Edsel Petrin, DO  folic acid (FOLVITE) 1 MG tablet Take 1 tablet (1 mg total) by mouth daily. 05/15/17   Mikhail, Nita Sells, DO  furosemide (LASIX) 20 MG tablet Take 1 tablet (20 mg total) by mouth daily. 05/15/17   Mikhail, Nita Sells, DO  guaiFENesin (MUCINEX) 600 MG 12 hr tablet Take 1 tablet (600 mg total) by mouth 2 (two) times daily. Patient not taking: Reported on 05/18/2017 05/14/17   Edsel Petrin, DO  Multiple Vitamin (MULTIVITAMIN WITH MINERALS) TABS tablet Take 1 tablet by mouth daily. Patient not taking: Reported on 05/18/2017 05/15/17   Edsel Petrin, DO  nicotine (NICODERM CQ - DOSED IN MG/24 HOURS) 21 mg/24hr patch Place 1 patch (21 mg total) onto the skin daily. Patient not taking: Reported on 05/18/2017  05/15/17   Edsel Petrin, DO  omeprazole (PRILOSEC) 20 MG capsule Take 1 capsule (20 mg total) by mouth daily as needed (for indegestion). 05/14/17   Mikhail, Nita Sells, DO  ondansetron (ZOFRAN ODT) 4 MG disintegrating tablet Take 1 tablet (4 mg total) by mouth every 8 (eight) hours as needed for nausea or vomiting. Patient taking differently: Take 4 mg by mouth every 8 (eight) hours as needed for nausea or vomiting (before pain medications).  01/27/16   Molpus, John, MD  predniSONE (DELTASONE) 20 MG tablet Take in the morning: Take 3 tablets x 3days, then take 2 tablets x 3days, then take 1 tablet x 3days. 05/15/17   Mikhail, Nita Sells, DO  sildenafil (REVATIO) 20 MG tablet Take 1 tablet (20 mg total) by mouth 3 (three) times daily. 06/07/17   Emilie Rutter, PA-C  thiamine 100 MG tablet Take 1 tablet (100 mg total) by mouth daily. 05/15/17   Edsel Petrin, DO  traMADol (  ULTRAM) 50 MG tablet Take 1 tablet (50 mg total) by mouth every 6 (six) hours as needed for up to 10 doses. 06/07/17   Emilie Rutter, PA-C    Physical Exam: Vitals:   06/11/17 2015 06/11/17 2119 06/11/17 2147 06/11/17 2333  BP: (!) 139/97 (!) 150/82 (!) 150/90 138/86  Pulse: 91 85 85 87  Resp: 13 17 16 18   Temp:  98.2 F (36.8 C)  98 F (36.7 C)  TempSrc:  Oral  Oral  SpO2: 100% 97% 97% 100%  Weight:    47.9 kg (105 lb 9.6 oz)  Height:    5\' 3"  (1.6 m)    Constitutional: No acute distress Head: Atraumatic Eyes: Conjunctiva clear ENM: Moist mucous membranes. poordentition.  Neck: Supple Respiratory: Clear to auscultation bilaterally, no wheezing/rales/rhonchi. Normal respiratory effort. No accessory muscle use. . Cardiovascular: Regular rate and rhythm. Soft systolic murmur, distant heart sounds Abdomen: Non-tender, non-distended. No masses. No rebound or guarding. Positive bowel sounds. Midline stapled incision extending from mid upper abdomen to suprapubis. C/di. Also healing incision b/l groin. Musculoskeletal: No  joint deformity upper and lower extremities. Normal ROM, no contractures. Normal muscle tone.  Skin: No rashes, lesions, or ulcers. Recent surgical signs as above Extremities: No peripheral edema. Warm feet. Neurologic: Alert, moving all 4 extremities. Psychiatric: Normal insight and judgement.   Labs on Admission: I have personally reviewed following labs and imaging studies  CBC: Recent Labs  Lab 06/06/17 0248 06/11/17 1502  WBC 7.0 6.5  NEUTROABS  --  4.3  HGB 12.6* 13.7  HCT 37.3* 39.8  MCV 98.7 98.0  PLT 272 464*   Basic Metabolic Panel: Recent Labs  Lab 06/06/17 0248 06/07/17 1010 06/11/17 1502  NA 132*  --  135  K 3.3*  --  3.3*  CL 100*  --  100*  CO2 24  --  24  GLUCOSE 122*  --  112*  BUN <5*  --  5*  CREATININE 0.56*  --  0.75  CALCIUM 8.0*  --  8.5*  MG  --  1.8  --    GFR: Estimated Creatinine Clearance: 64 mL/min (by C-G formula based on SCr of 0.75 mg/dL). Liver Function Tests: Recent Labs  Lab 06/11/17 1502  AST 28  ALT 17  ALKPHOS 106  BILITOT 0.7  PROT 6.7  ALBUMIN 2.9*   Recent Labs  Lab 06/11/17 1502  LIPASE 24   No results for input(s): AMMONIA in the last 168 hours. Coagulation Profile: No results for input(s): INR, PROTIME in the last 168 hours. Cardiac Enzymes: No results for input(s): CKTOTAL, CKMB, CKMBINDEX, TROPONINI in the last 168 hours. BNP (last 3 results) No results for input(s): PROBNP in the last 8760 hours. HbA1C: No results for input(s): HGBA1C in the last 72 hours. CBG: No results for input(s): GLUCAP in the last 168 hours. Lipid Profile: No results for input(s): CHOL, HDL, LDLCALC, TRIG, CHOLHDL, LDLDIRECT in the last 72 hours. Thyroid Function Tests: No results for input(s): TSH, T4TOTAL, FREET4, T3FREE, THYROIDAB in the last 72 hours. Anemia Panel: No results for input(s): VITAMINB12, FOLATE, FERRITIN, TIBC, IRON, RETICCTPCT in the last 72 hours. Urine analysis:    Component Value Date/Time    COLORURINE YELLOW 06/11/2017 1642   APPEARANCEUR CLEAR 06/11/2017 1642   LABSPEC <1.005 (L) 06/11/2017 1642   PHURINE 7.0 06/11/2017 1642   GLUCOSEU NEGATIVE 06/11/2017 1642   HGBUR NEGATIVE 06/11/2017 1642   BILIRUBINUR NEGATIVE 06/11/2017 1642   KETONESUR NEGATIVE 06/11/2017 1642  PROTEINUR NEGATIVE 06/11/2017 1642   NITRITE NEGATIVE 06/11/2017 1642   LEUKOCYTESUR NEGATIVE 06/11/2017 1642    Radiological Exams on Admission: Ct Angio Chest Pe W And/or Wo Contrast  Result Date: 06/11/2017 CLINICAL DATA:  Abdominal pain.  Recent femoral artery surgery. EXAM: CT ANGIOGRAPHY CHEST WITH CONTRAST TECHNIQUE: Multidetector CT imaging of the chest was performed using the standard protocol during bolus administration of intravenous contrast. Multiplanar CT image reconstructions and MIPs were obtained to evaluate the vascular anatomy. CONTRAST:  75mL ISOVUE-370 IOPAMIDOL (ISOVUE-370) INJECTION 76%. Reduced dose was authorized, the patient had a prior contrast enhanced exam today. COMPARISON:  05/11/2017 CT chest FINDINGS: Cardiovascular: Coronary, aortic arch, and branch vessel atherosclerotic vascular disease. Mild cardiomegaly with small pericardial effusion. No filling defect is identified in the pulmonary arterial tree to suggest pulmonary embolus. Mediastinum/Nodes: Right infrahilar lymph node 0.6 cm in short axis on image 55/4. No overt pathologic adenopathy in the chest. Lungs/Pleura: Severe centrilobular emphysema. Mild airway thickening. Stable 3 by 5 mm subpleural lymph node along the right lower lobe side of the major fissure on image 57/5. Upper Abdomen: Perihepatic and perisplenic ascites. Musculoskeletal: There is a suggestion of low-grade diffuse subcutaneous edema. Sub xiphoid skin clips with a fluid collection in the subxiphoid region, correlate with recent operative intervention. Lower cervical spondylosis. Review of the MIP images confirms the above findings. IMPRESSION: 1. No filling defect  is identified in the pulmonary arterial tree to suggest pulmonary embolus. 2. Aortic Atherosclerosis (ICD10-I70.0) and Emphysema (ICD10-J43.9). Coronary atherosclerosis. 3. Mild cardiomegaly with small pericardial effusion. 4. Airway thickening is present, suggesting bronchitis or reactive airways disease. 5. Perihepatic and perisplenic ascites with low-grade diffuse subcutaneous edema, query third spacing of fluid. 6. Postoperative findings in the subxiphoid region. Electronically Signed   By: Gaylyn Rong M.D.   On: 06/11/2017 19:53   Ct Angio Ao+bifem W & Or Wo Contrast  Result Date: 06/11/2017 CLINICAL DATA:  62 year old male with a reported history of recent bilateral femoral artery surgery. Evaluate for dissection or other complication. EXAM: CT ANGIOGRAPHY OF ABDOMINAL AORTA WITH ILIOFEMORAL RUNOFF TECHNIQUE: Multidetector CT imaging of the abdomen, pelvis and lower extremities was performed using the standard protocol during bolus administration of intravenous contrast. Multiplanar CT image reconstructions and MIPs were obtained to evaluate the vascular anatomy. CONTRAST:  ISOVUE-370 IOPAMIDOL (ISOVUE-370) INJECTION 76% COMPARISON:  Prior CTA runoff 04/29/2010 FINDINGS: VASCULAR Aorta: Heterogeneous atherosclerotic plaque throughout the abdominal aorta. Surgical changes of aorto bi femoral bypass graft. The aortic anastomosis is widely patent without evidence of complication. Celiac: Patent without evidence of aneurysm, dissection, vasculitis or significant stenosis. SMA: Patent without evidence of aneurysm, dissection, vasculitis or significant stenosis. Renals: Solitary renal arteries bilaterally. No evidence of significant stenosis, fibromuscular dysplasia, dissection or aneurysm. There is atherosclerotic plaque at the renal artery origins bilaterally. IMA: Thrombosed secondary to aortic bypass. The distal branches reconstitute via collateral flow. RIGHT Lower Extremity Inflow: Thrombosed  native iliac vessels. Widely patent aortic to right femoral bypass graft. Outflow: The profunda femoral branches remain widely patent. However, the superficial femoral artery is occluded throughout the abductor canal. The vessel reconstitutes as the above the knee popliteal artery secondary to profunda femoral collaterals. The P2 and P3 segments of the popliteal artery are diseased with bulky atherosclerotic vascular calcifications. There are likely multifocal mild to moderate stenoses. Runoff: Calcified atherosclerotic plaque throughout the tibioperoneal trunk. There appears to be patent 3 vessel runoff to the ankle. LEFT Lower Extremity Inflow: Thrombosed native iliac vessels. Widely patent aortic  to left common femoral bypass graft. Outflow: Common femoral artery is widely patent. The profunda femoral branches are also widely patent. However, the native superficial femoral artery is completely thrombosed but reconstitutes via collateral flow in the region of the abductor canal. The popliteal artery is diseased with multifocal areas of mild to moderate stenosis secondary to predominantly calcified atherosclerotic plaque. Runoff: Moderate focal stenosis at the origin of the anterior tibial artery. The tibioperoneal trunk is also diseased with a focal moderate to high-grade stenosis. However, there is patent 3 vessel runoff to the ankle. Veins: No focal venous abnormality identified. Review of the MIP images confirms the above findings. NON-VASCULAR Lower chest: Mild dependent atelectasis in the lower lobes. No pleural effusion. No suspicious pulmonary mass or nodule. Borderline left ventricular dilatation. Incompletely imaged right atrial dilatation. Significant reflux of injected contrast material into the super hepatic IVC and hepatic veins raising concern for underlying right heart strain. Unremarkable distal thoracic esophagus. Hepatobiliary: Normal hepatic contour and morphology. No discrete hepatic lesions.  Normal appearance of the gallbladder. No intra or extrahepatic biliary ductal dilatation. Pancreas: Unremarkable. No pancreatic ductal dilatation or surrounding inflammatory changes. Spleen: Normal in size without focal abnormality. Adrenals/Urinary Tract: Adrenal glands are unremarkable. Kidneys are normal, without renal calculi, focal lesion, or hydronephrosis. Bladder is unremarkable. Stomach/Bowel: No focal bowel wall thickening or evidence of obstruction. Lymphatic: No suspicious lymphadenopathy. Reproductive: Prostate is unremarkable. Other: Diffuse mesenteric edema. Small volume ascites. Small volume intermediate attenuation fluid in the region of the bilateral femoral artery cutdowns is not unexpected in the postoperative period. No evidence of unexpected fluid collection to suggest abscess or significant hematoma. Musculoskeletal: No acute fracture or aggressive appearing lytic or blastic osseous lesion. IMPRESSION: VASCULAR 1. Surgical changes of aortobifemoral bypass graft without evidence of complication. Expected postsurgical stranding and small fluid collections around the bilateral femoral arterial anastomoses. 2. 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 The profunda femoral arteries remain patent and reconstitute the outflow at the level of the P1 segment of the popliteal artery on the right, and in the distal SFA in the region of the abductor canal on the left. 3. Bilateral popliteal artery disease with multifocal mild and moderate stenoses secondary to predominantly calcified atherosclerotic plaque. 4. Calcified atherosclerotic plaque noted throughout the runoff territories bilaterally. However, there does appear to be patent 3 vessel runoff to the ankle bilaterally. NON-VASCULAR 1. Right atrial dilatation with significant reflux of contrast material into the super hepatic IVC and hepatic veins. This suggests the possibility of underlying right heart  strain/elevated right heart pressures. 2. Borderline left ventricular dilatation. Does the patient have a history of CHF? 3. Diffuse mild mesenteric edema and small volume ascites. This may be secondary to postoperative changes, third-spacing, or volume overload. 4. L5-S1 degenerative disc disease. Signed, Sterling Big, MD [facility] Electronically Signed   By: Malachy Moan M.D.   On: 06/11/2017 17:46     Assessment/Plan Active Problems:   Chronic systolic heart failure (HCC)   Peripheral vascular disease (HCC)   Tobacco abuse   COPD (chronic obstructive pulmonary disease) (HCC)   Hypertension   Abdominal pain, acute, left lower quadrant   Respiratory failure with hypoxia (HCC)   Acute left lower quadrant pain   # Left lower quadrant abdominal pain - etiology is unclear. Essentially resolved currently. Recent significant intraabdominal surgery, so reasonable that would lead to some ongoing discomfort. Not complaining of diarrhea or constipation. ua unremarkabele and  no symptoms of uti. CTA does not show complication of recent surgery or other intraabdominal pathology to explain symtpoms, and this has been discussed with vascular surgery. Per ED at [facility], they advised transfer here for evaluation in the AM. - f/u vascular surgery recs - continue to monitor  # COPD # Severe pulmonary hypertension # Hypoxic respiratory failure - in [facility] ED oxygen noted to go below 90 and stay there. Pt denies cough or other symptoms of copd exacerbation. I think it's possibly her is intermittently hypoxic at baseline. Does have recently diagnosed severe pulmonary hypertension but appears euvolemic and bnp, although elevated to 800s, is decreased from 1280 last month. - continue home inhaler - [place] o2, wean as able - continue sildenafil and lasix  # HTN - not on home meds, here bp wnl - ctm  DVT prophylaxis: lovenox Code Status: full    Family Communication: sister Edison Pace [phone]  Disposition Plan: tbd  Consults called: vascular surgery brabhan  Admission status: obs    Silvano Bilis MD [facility] Pager [phone]  If 7PM-7AM, please contact night-coverage [email or web address] Password [email or web address]  06/12/2017, 12:10 AM

## 2017-06-12 NOTE — Plan of Care (Signed)
  Problem: Elimination: Goal: Will not experience complications related to bowel motility Outcome: Completed/Met   Problem: Pain Managment: Goal: General experience of comfort will improve Outcome: Completed/Met   Problem: Skin Integrity: Goal: Risk for impaired skin integrity will decrease Outcome: Completed/Met   

## 2017-06-12 NOTE — Progress Notes (Signed)
Patient had two more loose BM's shortly after the change of shift ( totaling three BM's). Patient was then D/C' home with his Niece after discharge instructon given. No acute distress noted. Was taken down via W/C.

## 2017-06-12 NOTE — Consult Note (Signed)
    Subjective  -   62 year old, recently s/p ABF by Dr. Imogene Burn.  He presented to [facility] with complaints of left lower quadrant pain.  He had a CTA that did not show any complicating features related to his recent surgery.  CT showed right heart dilation.  He developed SOB and tachycardia with decreased O2 sats.  CT scan was negative for PE.  HE denies any blood in his stool.  He says he has had a similar issue in the past that was treated with a GI cocktail.  He is passing flatus and having regular bowel movements   Physical Exam:  Tenderness in LLQ and periumbilical.  No rebound Palpable femoral pulses       Assessment/Plan:    Currently, I do not see any issues related to his recent surgery.  Intestinal ischemia can be seen after ABF, however, it is unusual to see it this far out, and I would also expect him to have bloody stools if this were the case, so I doubt this is what is bothering him.  I would recommend giving him a GI cocktail to see if this helps.  He may benefit from cardiology evaluation given possible heart failure signs seen on CT.  Wells Rida Loudin 06/12/2017 11:26 AM --  Vitals:   06/12/17 0500 06/12/17 0911  BP: (!) 144/88   Pulse: 87   Resp: 18   Temp: 98.5 F (36.9 C)   SpO2: 100% 99%    Intake/Output Summary (Last 24 hours) at 06/12/2017 1126 Last data filed at 06/12/2017 0900 Gross per 24 hour  Intake 702 ml  Output 0 ml  Net 702 ml     Laboratory CBC    Component Value Date/Time   WBC 6.4 06/12/2017 0228   HGB 12.8 (L) 06/12/2017 0228   HCT 36.8 (L) 06/12/2017 0228   PLT 414 (H) 06/12/2017 0228    BMET    Component Value Date/Time   NA 136 06/12/2017 0228   K 3.5 06/12/2017 0228   CL 101 06/12/2017 0228   CO2 25 06/12/2017 0228   GLUCOSE 125 (H) 06/12/2017 0228   BUN <5 (L) 06/12/2017 0228   CREATININE 0.71 06/12/2017 0228   CALCIUM 8.4 (L) 06/12/2017 0228   GFRNONAA >60 06/12/2017 0228   GFRAA >60 06/12/2017 0228      COAG Lab Results  Component Value Date   INR 1.19 06/01/2017   INR 1.06 05/25/2017   INR 1.32 05/11/2017   No results found for: PTT  Antibiotics Anti-infectives (From admission, onward)   None       V. Charlena Cross, M.D. [facility] [place] Office: [phone] Pager:  [phone]

## 2017-06-14 ENCOUNTER — Encounter (HOSPITAL_COMMUNITY): Payer: Self-pay | Admitting: Internal Medicine

## 2017-06-14 ENCOUNTER — Ambulatory Visit (HOSPITAL_COMMUNITY)
Admission: RE | Admit: 2017-06-14 | Discharge: 2017-06-14 | Disposition: A | Discharge status: Home / Self Care | Payer: Medicaid Other | Source: Ambulatory Visit | Attending: Internal Medicine | Admitting: Internal Medicine

## 2017-06-14 VITALS — BP 140/88 | HR 93 | Ht 63.0 in | Wt 111.6 lb

## 2017-06-14 DIAGNOSIS — E7800 Pure hypercholesterolemia, unspecified: Secondary | ICD-10-CM | POA: Insufficient documentation

## 2017-06-14 DIAGNOSIS — I5022 Chronic systolic (congestive) heart failure: Secondary | ICD-10-CM | POA: Diagnosis present

## 2017-06-14 DIAGNOSIS — F17210 Nicotine dependence, cigarettes, uncomplicated: Secondary | ICD-10-CM | POA: Diagnosis not present

## 2017-06-14 DIAGNOSIS — I272 Pulmonary hypertension, unspecified: Secondary | ICD-10-CM | POA: Diagnosis not present

## 2017-06-14 DIAGNOSIS — K219 Gastro-esophageal reflux disease without esophagitis: Secondary | ICD-10-CM | POA: Diagnosis not present

## 2017-06-14 DIAGNOSIS — Z7982 Long term (current) use of aspirin: Secondary | ICD-10-CM | POA: Diagnosis not present

## 2017-06-14 DIAGNOSIS — R7303 Prediabetes: Secondary | ICD-10-CM | POA: Diagnosis not present

## 2017-06-14 DIAGNOSIS — F1010 Alcohol abuse, uncomplicated: Secondary | ICD-10-CM | POA: Diagnosis not present

## 2017-06-14 DIAGNOSIS — Z7951 Long term (current) use of inhaled steroids: Secondary | ICD-10-CM | POA: Insufficient documentation

## 2017-06-14 DIAGNOSIS — I10 Essential (primary) hypertension: Secondary | ICD-10-CM

## 2017-06-14 DIAGNOSIS — I110 Hypertensive heart disease with heart failure: Secondary | ICD-10-CM | POA: Insufficient documentation

## 2017-06-14 DIAGNOSIS — Z7289 Other problems related to lifestyle: Secondary | ICD-10-CM | POA: Insufficient documentation

## 2017-06-14 DIAGNOSIS — J449 Chronic obstructive pulmonary disease, unspecified: Secondary | ICD-10-CM | POA: Diagnosis not present

## 2017-06-14 DIAGNOSIS — I2721 Secondary pulmonary arterial hypertension: Secondary | ICD-10-CM | POA: Diagnosis not present

## 2017-06-14 DIAGNOSIS — I739 Peripheral vascular disease, unspecified: Secondary | ICD-10-CM | POA: Diagnosis not present

## 2017-06-14 DIAGNOSIS — Z79899 Other long term (current) drug therapy: Secondary | ICD-10-CM | POA: Diagnosis not present

## 2017-06-14 DIAGNOSIS — I252 Old myocardial infarction: Secondary | ICD-10-CM | POA: Insufficient documentation

## 2017-06-14 NOTE — Patient Instructions (Signed)
Follow up as needed

## 2017-06-14 NOTE — Progress Notes (Signed)
ADVANCED HF CLINIC NOTE  PCP: Dr. Mordecai Maes  Primary Cardiologist: Arnette Felts PA-C Pulmonology: Gerome Apley   HPI:  Wayne [name] is a 62 y.o. male with a hx of chronic systolic CHF with recovered EF (EF 20-25% by echo 2017, 40-45% by echo in 01/2017), severe pulm HTN, PAD, COPD, alcohol abuse who presents for post-hospital f/u   He has followed with Arnette Felts PA-C int he [facility]. EF was 20-25% in 2017. LV dysfunction was due to unclear etiology. Did not had cath.  Had nuclear stress test 6/18 EF 40% no scar or ischemia. Echo 01/2017 showing EF 40-45%, mildly impaired RV function, moderate mitral regurgitation, mild-mod TR, severe pulm HTN with RVSP 63.  He was recently admitted for symptomatic PAD and underwent aortobifem bypass grafting on 06/01/17. Swan placed intra-operatively and found to have PA pressure of 90/40 with PCWP 19. Started on milrinone intra-operatively. Toelratived surgery well. Milrinone weaned off POD #1. Sildenafil 20 tid added.   Has smoked all his life. About 2ppd. Now down 4 cigs per day. Says he gets SOB due to his COPD. Has not worn oxygen. Occasional edema managed with lasix. No syncope or CP. Drinks about 6-7 beers per week. Denies drugs. Able to do ADLs without any difficulty. After surgery has been staying with sister.   CT 06/21/17 showed no PE. With severe COPD   Echo 06/03/17  - Left ventricle: The cavity size was normal. Wall thickness was   normal. Systolic function was normal. The estimated ejection   fraction was in the range of 60% to 65%. Wall motion was normal;   there were no regional wall motion abnormalities. Doppler   parameters are consistent with abnormal left ventricular   relaxation (grade 1 diastolic dysfunction). The E/e&' ratio is   >15, suggesting elevated LV filling pressure. - Mitral valve: Calcified annulus. There was mild regurgitation. - Left atrium: Moderately dilated. - Right atrium: The atrium was normal in size. - Atrial  septum: Aneurysmal IAS - cannot exclude PFO. - Tricuspid valve: There was mild regurgitation. - Pulmonary arteries: PA peak pressure: 71 mm Hg (S). - Inferior vena cava: The vessel was normal in size. The   respirophasic diameter changes were in the normal range (= 50%),   consistent with normal central venous pressure.   ROS: All systems negative except as listed in HPI, PMH and Problem List.  SH:  Social History   Socioeconomic History  . Marital status: Married    Spouse name: Not on file  . Number of children: Not on file  . Years of education: Not on file  . Highest education level: Not on file  Occupational History  . Not on file  Social Needs  . Financial resource strain: Not on file  . Food insecurity:    Worry: Not on file    Inability: Not on file  . Transportation needs:    Medical: Not on file    Non-medical: Not on file  Tobacco Use  . Smoking status: Current Every Day Smoker    Packs/day: 0.25    Years: 50.00    Pack years: 12.50    Types: Cigarettes  . Smokeless tobacco: Never Used  Substance and Sexual Activity  . Alcohol use: Yes    Alcohol/week: 4.8 oz    Types: 8 Cans of beer per week  . Drug use: No  . Sexual activity: Not Currently  Lifestyle  . Physical activity:    Days per week: Not on file  Minutes per session: Not on file  . Stress: Not on file  Relationships  . Social connections:    Talks on phone: Not on file    Gets together: Not on file    Attends religious service: Not on file    Active member of club or organization: Not on file    Attends meetings of clubs or organizations: Not on file    Relationship status: Not on file  . Intimate partner violence:    Fear of current or ex partner: Not on file    Emotionally abused: Not on file    Physically abused: Not on file    Forced sexual activity: Not on file  Other Topics Concern  . Not on file  Social History Narrative  . Not on file    FH:  Family History  Problem  Relation Age of Onset  . Hypertension Sister   . Hyperlipidemia Sister   . Diabetes Sister   . Peripheral Artery Disease Father     Past Medical History:  Diagnosis Date  . Abnormal pulmonary finding    a. preadmission note indicates pt followed by Dr. Bethanie Dicker for CHF, lung mass, COPD, pulmonary mycosis with plan for 3 mo f/u with CT by 03/2017 note.  . Alcohol abuse   . Anemia   . Arthritis    "right hip; lower back" (05/11/2017)  . Chronic systolic CHF (congestive heart failure) (HCC)    a. EF 20-25% by echo 2017. b. 40-45% by echo in 01/2017.  Marland Kitchen COPD (chronic obstructive pulmonary disease) (HCC)   . Dyspnea   . GERD (gastroesophageal reflux disease)   . History of blood transfusion 2004   "related to bleeding stomach ulcerts"  . History of stomach ulcers 2004  . Hypercholesterolemia   . Hypertension   . Malnutrition (HCC)   . Moderate mitral regurgitation   . Myocardial infarction [facility]) 2004   "think it was bleeding ulcers; not a heart attack" (05/11/2017), pt denies heart cath  . Peripheral vascular disease (HCC)   . Pre-diabetes   . Pulmonary hypertension (HCC)    a. severe pulm HTN by echo 01/2017.  . Tricuspid regurgitation 2017    Current Outpatient Medications  Medication Sig Dispense Refill  . acetaminophen (TYLENOL) 500 MG tablet Take 2 tablets (1,000 mg total) by mouth every 6 (six) hours as needed. (Patient taking differently: Take 250 mg by mouth daily as needed for moderate pain or headache. ) 30 tablet 0  . albuterol (PROVENTIL HFA;VENTOLIN HFA) 108 (90 Base) MCG/ACT inhaler Inhale 2 puffs into the lungs every 6 (six) hours as needed for wheezing or shortness of breath.    Marland Kitchen aspirin EC 81 MG tablet Take 81 mg by mouth daily.    Marland Kitchen atorvastatin (LIPITOR) 20 MG tablet Take 20 mg by mouth daily.    . budesonide-formoterol (SYMBICORT) 160-4.5 MCG/ACT inhaler Inhale 2 puffs into the lungs 2 (two) times daily.    . furosemide (LASIX) 20 MG tablet Take 1 tablet  (20 mg total) by mouth daily. 30 tablet 0  . Multiple Vitamin (MULTIVITAMIN WITH MINERALS) TABS tablet Take 1 tablet by mouth daily. 30 tablet 0  . nicotine (NICODERM CQ - DOSED IN MG/24 HOURS) 21 mg/24hr patch Place 1 patch (21 mg total) onto the skin daily. 28 patch 0  . omeprazole (PRILOSEC) 20 MG capsule Take 1 capsule (20 mg total) by mouth daily as needed (for indegestion). 30 capsule 0  . ondansetron (ZOFRAN ODT) 4 MG  disintegrating tablet Take 1 tablet (4 mg total) by mouth every 8 (eight) hours as needed for nausea or vomiting. (Patient taking differently: Take 4 mg by mouth every 8 (eight) hours as needed for nausea or vomiting (before pain medications). ) 10 tablet 1  . polyethylene glycol (MIRALAX) packet Take 17 g by mouth daily. 30 each 0  . senna-docusate (SENOKOT S) 8.6-50 MG tablet Take 1 tablet by mouth 2 (two) times daily.    . sildenafil (REVATIO) 20 MG tablet Take 1 tablet (20 mg total) by mouth 3 (three) times daily. 90 tablet 0  . thiamine 100 MG tablet Take 1 tablet (100 mg total) by mouth daily. 30 tablet 0  . traMADol (ULTRAM) 50 MG tablet Take 1 tablet (50 mg total) by mouth every 6 (six) hours as needed for up to 10 doses. 10 tablet 0   No current facility-administered medications for this encounter.     Vitals:   06/14/17 1449  BP: 140/88  Pulse: 93  SpO2: (!) 84%  Weight: 111 lb 9.6 oz (50.6 kg)  Height: 5\' 3"  (1.6 m)   PHYSICAL EXAM:  General:  Thin male. Walks gingerly due to recent surgery. No resp difficulty HEENT: normal Neck: supple. JVP flat 9-10. Carotids 2+ bilaterally; no bruits. No lymphadenopathy or thryomegaly appreciated. Cor: PMI normal. Regular rate & rhythm. Loud P2.  Lungs: Markedly reduced BS throughout. no wheeze.  Abdomen: soft, large midline surgical scar with staples. Mildly distended No hepatosplenomegaly. No bruits or masses. Good bowel sounds. Extremities: no cyanosis, rash, edema. Mild clubbing Neuro: alert & orientedx3, cranial  nerves grossly intact. Moves all 4 extremities w/o difficulty. Affect pleasant.  Hall walk: sats 84-90%  ECG: NSR 84 anterior TWI Personally reviewed    ASSESSMENT & PLAN: 1. PAH  - suspect WHO group III (chronic hypoxic lung disease) but may have component of cirrhosis and porto-pulmonary syndrome however liver parenchyma normal on recent CT for aorto-bifem - very skeptical of need to wear O2 or to have future w/u at this point - I did call his Pulmonolgist personally and recent PFTs not as bad as I expected. We discussed the need for home O2 and they will discuss.  - Would recommend VQ study, hepatitis panels and supplemental O2. Continue sildenafil for now - He is currently refusing further w/u for PAH with me but I would be happy to see him back as he would like   2. PAD s/p recent aorto-bifem bypass  - follows with [facility]. - continue ASA and statin - counseled on need to stop smoking  3. ETOH use/abuse - counseled him to cut back or stop completely  4. HTN - followed br Arnette Felts PA-C  Total time spent 50 minutes. Over half that time spent discussing above.   Arvilla Meres, MD  1:42 PM

## 2017-06-19 ENCOUNTER — Inpatient Hospital Stay (HOSPITAL_COMMUNITY)
Admission: EM | Admit: 2017-06-19 | Discharge: 2017-06-21 | DRG: 389 | Disposition: A | Discharge status: Home / Self Care | Payer: Medicaid Other | Attending: Internal Medicine | Admitting: Internal Medicine

## 2017-06-19 ENCOUNTER — Emergency Department (HOSPITAL_COMMUNITY): Payer: Medicaid Other

## 2017-06-19 ENCOUNTER — Other Ambulatory Visit: Payer: Self-pay

## 2017-06-19 ENCOUNTER — Encounter (HOSPITAL_COMMUNITY): Payer: Self-pay | Admitting: *Deleted

## 2017-06-19 DIAGNOSIS — K567 Ileus, unspecified: Secondary | ICD-10-CM | POA: Diagnosis not present

## 2017-06-19 DIAGNOSIS — K56609 Unspecified intestinal obstruction, unspecified as to partial versus complete obstruction: Secondary | ICD-10-CM

## 2017-06-19 DIAGNOSIS — I110 Hypertensive heart disease with heart failure: Secondary | ICD-10-CM | POA: Diagnosis present

## 2017-06-19 DIAGNOSIS — E440 Moderate protein-calorie malnutrition: Secondary | ICD-10-CM | POA: Diagnosis present

## 2017-06-19 DIAGNOSIS — I5022 Chronic systolic (congestive) heart failure: Secondary | ICD-10-CM | POA: Diagnosis present

## 2017-06-19 DIAGNOSIS — Z8249 Family history of ischemic heart disease and other diseases of the circulatory system: Secondary | ICD-10-CM | POA: Diagnosis not present

## 2017-06-19 DIAGNOSIS — Z7982 Long term (current) use of aspirin: Secondary | ICD-10-CM | POA: Diagnosis not present

## 2017-06-19 DIAGNOSIS — K7460 Unspecified cirrhosis of liver: Secondary | ICD-10-CM

## 2017-06-19 DIAGNOSIS — I739 Peripheral vascular disease, unspecified: Secondary | ICD-10-CM | POA: Diagnosis present

## 2017-06-19 DIAGNOSIS — I10 Essential (primary) hypertension: Secondary | ICD-10-CM | POA: Diagnosis present

## 2017-06-19 DIAGNOSIS — K219 Gastro-esophageal reflux disease without esophagitis: Secondary | ICD-10-CM | POA: Diagnosis present

## 2017-06-19 DIAGNOSIS — J449 Chronic obstructive pulmonary disease, unspecified: Secondary | ICD-10-CM | POA: Diagnosis present

## 2017-06-19 DIAGNOSIS — F1020 Alcohol dependence, uncomplicated: Secondary | ICD-10-CM | POA: Diagnosis present

## 2017-06-19 DIAGNOSIS — F1010 Alcohol abuse, uncomplicated: Secondary | ICD-10-CM

## 2017-06-19 DIAGNOSIS — I4581 Long QT syndrome: Secondary | ICD-10-CM | POA: Diagnosis present

## 2017-06-19 DIAGNOSIS — K9131 Postprocedural partial intestinal obstruction: Secondary | ICD-10-CM

## 2017-06-19 DIAGNOSIS — F17210 Nicotine dependence, cigarettes, uncomplicated: Secondary | ICD-10-CM | POA: Diagnosis present

## 2017-06-19 DIAGNOSIS — K56600 Partial intestinal obstruction, unspecified as to cause: Principal | ICD-10-CM | POA: Diagnosis present

## 2017-06-19 DIAGNOSIS — I272 Pulmonary hypertension, unspecified: Secondary | ICD-10-CM | POA: Diagnosis present

## 2017-06-19 DIAGNOSIS — E7800 Pure hypercholesterolemia, unspecified: Secondary | ICD-10-CM | POA: Diagnosis present

## 2017-06-19 DIAGNOSIS — R509 Fever, unspecified: Secondary | ICD-10-CM

## 2017-06-19 DIAGNOSIS — I252 Old myocardial infarction: Secondary | ICD-10-CM

## 2017-06-19 DIAGNOSIS — Z681 Body mass index (BMI) 19 or less, adult: Secondary | ICD-10-CM | POA: Diagnosis not present

## 2017-06-19 DIAGNOSIS — E861 Hypovolemia: Secondary | ICD-10-CM | POA: Diagnosis present

## 2017-06-19 DIAGNOSIS — R109 Unspecified abdominal pain: Secondary | ICD-10-CM | POA: Diagnosis present

## 2017-06-19 DIAGNOSIS — Z09 Encounter for follow-up examination after completed treatment for conditions other than malignant neoplasm: Secondary | ICD-10-CM

## 2017-06-19 DIAGNOSIS — K7031 Alcoholic cirrhosis of liver with ascites: Secondary | ICD-10-CM | POA: Diagnosis present

## 2017-06-19 LAB — CBC WITH DIFFERENTIAL/PLATELET
Basophils Absolute: 0 10*3/uL (ref 0.0–0.1)
Basophils Relative: 1 %
Eosinophils Absolute: 0 10*3/uL (ref 0.0–0.7)
Eosinophils Relative: 1 %
HCT: 37 % — ABNORMAL LOW (ref 39.0–52.0)
HEMOGLOBIN: 12.4 g/dL — AB (ref 13.0–17.0)
LYMPHS ABS: 1.6 10*3/uL (ref 0.7–4.0)
LYMPHS PCT: 25 %
MCH: 32.9 pg (ref 26.0–34.0)
MCHC: 33.5 g/dL (ref 30.0–36.0)
MCV: 98.1 fL (ref 78.0–100.0)
Monocytes Absolute: 0.7 10*3/uL (ref 0.1–1.0)
Monocytes Relative: 11 %
Neutro Abs: 4 10*3/uL (ref 1.7–7.7)
Neutrophils Relative %: 64 %
Platelets: 249 10*3/uL (ref 150–400)
RBC: 3.77 MIL/uL — AB (ref 4.22–5.81)
RDW: 14.3 % (ref 11.5–15.5)
WBC: 6.3 10*3/uL (ref 4.0–10.5)

## 2017-06-19 LAB — COMPREHENSIVE METABOLIC PANEL
ALT: 13 U/L — AB (ref 17–63)
AST: 18 U/L (ref 15–41)
Albumin: 2.5 g/dL — ABNORMAL LOW (ref 3.5–5.0)
Alkaline Phosphatase: 86 U/L (ref 38–126)
Anion gap: 8 (ref 5–15)
BUN: <5 mg/dL — ABNORMAL LOW (ref 6–20)
CALCIUM: 8 mg/dL — AB (ref 8.9–10.3)
CO2: 21 mmol/L — ABNORMAL LOW (ref 22–32)
CREATININE: 0.79 mg/dL (ref 0.61–1.24)
Chloride: 106 mmol/L (ref 101–111)
GFR calc non Af Amer: >60 mL/min (ref >60)
Glucose, Bld: 90 mg/dL (ref 65–99)
Potassium: 3.6 mmol/L (ref 3.5–5.1)
Sodium: 135 mmol/L (ref 135–145)
Total Bilirubin: 0.4 mg/dL (ref 0.3–1.2)
Total Protein: 5.7 g/dL — ABNORMAL LOW (ref 6.5–8.1)

## 2017-06-19 LAB — PROTIME-INR
INR: 1.21
PROTHROMBIN TIME: 15.2 s (ref 11.4–15.2)

## 2017-06-19 LAB — BRAIN NATRIURETIC PEPTIDE: B Natriuretic Peptide: 279.4 pg/mL — ABNORMAL HIGH (ref 0.0–100.0)

## 2017-06-19 MED ORDER — NICOTINE 21 MG/24HR TD PT24
21.0000 mg | MEDICATED_PATCH | Freq: Every day | TRANSDERMAL | Status: DC
  Administered 2017-06-19 – 2017-06-21 (×4): 21 mg via TRANSDERMAL
  Filled 2017-06-19 (×4): qty 1

## 2017-06-19 MED ORDER — ENOXAPARIN SODIUM 40 MG/0.4ML ~~LOC~~ SOLN
40.0000 mg | Freq: Every day | SUBCUTANEOUS | Status: DC
  Administered 2017-06-19 – 2017-06-21 (×3): 40 mg via SUBCUTANEOUS
  Filled 2017-06-19 (×4): qty 0.4

## 2017-06-19 MED ORDER — ADULT MULTIVITAMIN W/MINERALS CH
1.0000 | ORAL_TABLET | Freq: Every day | ORAL | Status: DC
  Administered 2017-06-20 – 2017-06-21 (×2): 1 via ORAL
  Filled 2017-06-19 (×2): qty 1

## 2017-06-19 MED ORDER — DEXTROSE-NACL 5-0.9 % IV SOLN
INTRAVENOUS | Status: DC
  Administered 2017-06-19: 75 mL/h via INTRAVENOUS
  Administered 2017-06-20 (×2): via INTRAVENOUS

## 2017-06-19 MED ORDER — LORAZEPAM 2 MG/ML IJ SOLN
1.0000 mg | Freq: Four times a day (QID) | INTRAMUSCULAR | Status: DC | PRN
  Administered 2017-06-19: 1 mg via INTRAVENOUS
  Filled 2017-06-19: qty 1

## 2017-06-19 MED ORDER — FOLIC ACID 1 MG PO TABS
1.0000 mg | ORAL_TABLET | Freq: Every day | ORAL | Status: DC
  Administered 2017-06-20 – 2017-06-21 (×2): 1 mg via ORAL
  Filled 2017-06-19 (×2): qty 1

## 2017-06-19 MED ORDER — MOMETASONE FURO-FORMOTEROL FUM 200-5 MCG/ACT IN AERO
2.0000 | INHALATION_SPRAY | Freq: Two times a day (BID) | RESPIRATORY_TRACT | Status: DC
  Administered 2017-06-19 – 2017-06-21 (×4): 2 via RESPIRATORY_TRACT
  Filled 2017-06-19 (×2): qty 8.8

## 2017-06-19 MED ORDER — ONDANSETRON HCL 4 MG/2ML IJ SOLN: 4.0000 mg | Freq: Four times a day (QID) | INTRAMUSCULAR | Status: DC | PRN

## 2017-06-19 MED ORDER — ALBUTEROL SULFATE (2.5 MG/3ML) 0.083% IN NEBU: 2.5000 mg | INHALATION_SOLUTION | RESPIRATORY_TRACT | Status: DC | PRN

## 2017-06-19 MED ORDER — VITAMIN B-1 100 MG PO TABS
100.0000 mg | ORAL_TABLET | Freq: Every day | ORAL | Status: DC
  Administered 2017-06-20 – 2017-06-21 (×2): 100 mg via ORAL
  Filled 2017-06-19 (×2): qty 1

## 2017-06-19 MED ORDER — SODIUM CHLORIDE 0.9 % IV SOLN
INTRAVENOUS | Status: DC
Start: ? — End: 2017-06-19

## 2017-06-19 MED ORDER — KCL IN DEXTROSE-NACL 20-5-0.45 MEQ/L-%-% IV SOLN
INTRAVENOUS | Status: DC
  Administered 2017-06-19: 07:00:00 via INTRAVENOUS
  Filled 2017-06-19: qty 1000

## 2017-06-19 MED ORDER — ACETAMINOPHEN 650 MG RE SUPP: 650.0000 mg | Freq: Four times a day (QID) | RECTAL | Status: DC | PRN

## 2017-06-19 MED ORDER — ACETAMINOPHEN 325 MG PO TABS
650.0000 mg | ORAL_TABLET | Freq: Four times a day (QID) | ORAL | Status: DC | PRN
  Administered 2017-06-20: 650 mg via ORAL
  Filled 2017-06-19: qty 2

## 2017-06-19 MED ORDER — NICOTINE 21 MG/24HR TD PT24: 21.0000 mg | MEDICATED_PATCH | Freq: Every day | TRANSDERMAL | Status: DC

## 2017-06-19 MED ORDER — LORAZEPAM 2 MG/ML IJ SOLN: 0.0000 mg | Freq: Two times a day (BID) | INTRAMUSCULAR | Status: DC

## 2017-06-19 MED ORDER — FENTANYL CITRATE (PF) 100 MCG/2ML IJ SOLN
25.0000 ug | INTRAMUSCULAR | Status: DC | PRN
  Administered 2017-06-19 – 2017-06-20 (×4): 50 ug via INTRAVENOUS
  Filled 2017-06-19 (×5): qty 2

## 2017-06-19 MED ORDER — LORAZEPAM 2 MG/ML IJ SOLN: 0.0000 mg | Freq: Four times a day (QID) | INTRAMUSCULAR | Status: AC

## 2017-06-19 MED ORDER — FENTANYL CITRATE (PF) 100 MCG/2ML IJ SOLN: 50.0000 ug | Freq: Once | INTRAMUSCULAR | Status: DC

## 2017-06-19 MED ORDER — LORAZEPAM 2 MG/ML IJ SOLN
2.0000 mg | Freq: Once | INTRAMUSCULAR | Status: DC
Start: 2017-06-19 — End: 2017-06-19

## 2017-06-19 MED ORDER — LORAZEPAM 1 MG PO TABS: 1.0000 mg | ORAL_TABLET | Freq: Four times a day (QID) | ORAL | Status: DC | PRN

## 2017-06-19 MED ORDER — ONDANSETRON HCL 4 MG PO TABS: 4.0000 mg | ORAL_TABLET | Freq: Four times a day (QID) | ORAL | Status: DC | PRN

## 2017-06-19 MED ORDER — MAGNESIUM SULFATE IN D5W 1-5 GM/100ML-% IV SOLN
1.0000 g | Freq: Once | INTRAVENOUS | Status: AC
  Administered 2017-06-19: 1 g via INTRAVENOUS
  Filled 2017-06-19: qty 100

## 2017-06-19 MED ORDER — PROMETHAZINE HCL 25 MG/ML IJ SOLN: 12.5000 mg | Freq: Four times a day (QID) | INTRAMUSCULAR | Status: DC | PRN

## 2017-06-19 MED ORDER — THIAMINE HCL 100 MG/ML IJ SOLN
100.0000 mg | Freq: Every day | INTRAMUSCULAR | Status: DC
  Administered 2017-06-19: 100 mg via INTRAVENOUS
  Filled 2017-06-19: qty 2

## 2017-06-19 NOTE — ED Notes (Signed)
Number 14 n-g tube placed rt nostril pt tolerated the procedure very well.  White froathy  Sputum into the suction cannister

## 2017-06-19 NOTE — ED Notes (Signed)
The pts  Abdominal staples are intact no redness or swelling

## 2017-06-19 NOTE — ED Provider Notes (Signed)
[facility] EMERGENCY DEPARTMENT Provider Note   CSN: [id number] Arrival date & time: 06/19/17  0255     History   Chief Complaint Chief Complaint  Patient presents with  . Abdominal Pain    HPI NAT LOWENTHAL is a 62 y.o. male with a hx of COPD, pulmonary mycosis, alcohol abuse, anemia, chronic systolic heart failure, GERD, peptic ulcer disease, hypertension, MI, pulmonary hypertension presents to the Emergency Department complaining of gradual, persistent, progressively worsening abdominal pain onset approximately 24-48 hours ago.  Patient had aortofemoral bypass graft complicated by postop altered mental status, intermittent hypoxia.  He was discharged home and then readmitted with worsening abdominal pain.  At that time he was given several days of pain control with significant improvement.  No complication of the graft was noted.  Patient reports that several days ago he developed the abdominal pain again.  He reports that his last bowel movement was at 430 Friday morning.  He states he has had almost daily bowel movements.  He reports they have been firm but he has not been required to strain.  He denies bloody bowel movements, vomiting or diarrhea.  Patient lives in [facility] and therefore presented to [facility] for his abdominal pain earlier this evening.  CT scan obtained at that facility shows diffuse filled fluid distention of the small intestine since prior exam.  No specific transition point was identified however findings are concerning for diffuse small bowel ileus versus early or partial small bowel obstruction.  No free air or evidence of pneumatosis.  Per ED physician at [facility] regional, general surgery was unwilling to admit the patient at their facility.  Patient was discussed with Dr. Randie Heinz of vascular surgery who reports this is not a vascular surgery problem.  Patient was therefore transferred here to this facility for further evaluation and  treatment.  The history is provided by the patient and medical records. No language interpreter was used.    Past Medical History:  Diagnosis Date  . Abnormal pulmonary finding    a. preadmission note indicates pt followed by Dr. Bethanie Dicker for CHF, lung mass, COPD, pulmonary mycosis with plan for 3 mo f/u with CT by 03/2017 note.  . Alcohol abuse   . Anemia   . Arthritis    "right hip; lower back" (05/11/2017)  . Chronic systolic CHF (congestive heart failure) (HCC)    a. EF 20-25% by echo 2017. b. 40-45% by echo in 01/2017.  Marland Kitchen COPD (chronic obstructive pulmonary disease) (HCC)   . Dyspnea   . GERD (gastroesophageal reflux disease)   . History of blood transfusion 2004   "related to bleeding stomach ulcerts"  . History of stomach ulcers 2004  . Hypercholesterolemia   . Hypertension   . Malnutrition (HCC)   . Moderate mitral regurgitation   . Myocardial infarction [facility]) 2004   "think it was bleeding ulcers; not a heart attack" (05/11/2017), pt denies heart cath  . Peripheral vascular disease (HCC)   . Pre-diabetes   . Pulmonary hypertension (HCC)    a. severe pulm HTN by echo 01/2017.  . Tricuspid regurgitation 2017    Patient Active Problem List   Diagnosis Date Noted  . Partial small bowel obstruction (HCC) 06/19/2017  . Abdominal pain, acute, left lower quadrant 06/12/2017  . Respiratory failure with hypoxia (HCC) 06/12/2017  . Acute left lower quadrant pain 06/12/2017  . Constipation 06/12/2017  . PAH (pulmonary artery hypertension) (HCC)   . Aortic  occlusion (HCC) 06/01/2017  . Malnutrition of moderate degree 05/12/2017  . COPD with acute exacerbation (HCC) 05/11/2017  . Fever 05/11/2017  . ETOH abuse 05/11/2017  . Anemia   . COPD (chronic obstructive pulmonary disease) (HCC)   . Hypertension   . Critical lower limb ischemia 04/01/2017  . Peripheral vascular disease (HCC) 03/22/2017  . Simple chronic bronchitis (HCC) 03/22/2017  . Arthritis of sacroiliac joint  of both sides 12/11/2016  . Degenerative disc disease, lumbar 12/11/2016  . Primary osteoarthritis of right hip 12/11/2016  . Hypokalemia 01/19/2016  . Chronic systolic heart failure (HCC) 01/18/2016  . Acute respiratory failure with hypoxia (HCC) 01/18/2016  . Bullous lesion 01/18/2016  . Drinks beer 01/18/2016  . Hyperlipidemia 01/18/2016  . Hyponatremia 01/18/2016  . Tobacco abuse 01/18/2016    Past Surgical History:  Procedure Laterality Date  . ABDOMINAL AORTOGRAM W/LOWER EXTREMITY N/A 04/01/2017   Procedure: ABDOMINAL AORTOGRAM W/LOWER EXTREMITY;  Surgeon: Fransisco Hertz, MD;  Location[facility] INVASIVE CV LAB;  Service: Cardiovascular;  Laterality: N/A;  Bilateral  . AORTA - BILATERAL FEMORAL ARTERY BYPASS GRAFT N/A 06/01/2017   Procedure: AORTA BIFEMORAL BYPASS GRAFT with Graft and Right Femoral Endartarectomy;  Surgeon: Fransisco Hertz, MD;  Location: [facility] OR;  Service: Vascular;  Laterality: N/A;  . COLONOSCOPY    . HEMORRHOIDECTOMY WITH HEMORRHOID BANDING    . MASS EXCISION Left 04/13/2017   Procedure: EXCISION EPIDERMAL INCLUSION CYST LEFT GROIN;  Surgeon: Fransisco Hertz, MD;  Location: [facility] OR;  Service: Vascular;  Laterality: Left;  . PERIPHERAL VASCULAR INTERVENTION Left    "groin; for clogged artery"        Home Medications    Prior to Admission medications   Medication Sig Start Date End Date Taking? Authorizing Provider  acetaminophen (TYLENOL) 500 MG tablet Take 2 tablets (1,000 mg total) by mouth every 6 (six) hours as needed. Patient taking differently: Take 250 mg by mouth daily as needed for moderate pain or headache.  02/17/16   Roxy Horseman, PA-C  albuterol (PROVENTIL HFA;VENTOLIN HFA) 108 (90 Base) MCG/ACT inhaler Inhale 2 puffs into the lungs every 6 (six) hours as needed for wheezing or shortness of breath.    [provider]  aspirin EC 81 MG tablet Take 81 mg by mouth daily.    [provider]  atorvastatin (LIPITOR) 20 MG tablet Take 20 mg by  mouth daily.    [provider]  budesonide-formoterol (SYMBICORT) 160-4.5 MCG/ACT inhaler Inhale 2 puffs into the lungs 2 (two) times daily.    [provider]  furosemide (LASIX) 20 MG tablet Take 1 tablet (20 mg total) by mouth daily. 05/15/17   Edsel Petrin, DO  Multiple Vitamin (MULTIVITAMIN WITH MINERALS) TABS tablet Take 1 tablet by mouth daily. 05/15/17   Mikhail, Nita Sells, DO  nicotine (NICODERM CQ - DOSED IN MG/24 HOURS) 21 mg/24hr patch Place 1 patch (21 mg total) onto the skin daily. 05/15/17   Mikhail, Nita Sells, DO  omeprazole (PRILOSEC) 20 MG capsule Take 1 capsule (20 mg total) by mouth daily as needed (for indegestion). 05/14/17   Mikhail, Nita Sells, DO  ondansetron (ZOFRAN ODT) 4 MG disintegrating tablet Take 1 tablet (4 mg total) by mouth every 8 (eight) hours as needed for nausea or vomiting. Patient taking differently: Take 4 mg by mouth every 8 (eight) hours as needed for nausea or vomiting (before pain medications).  01/27/16   Molpus, John, MD  polyethylene glycol [facility]) packet Take 17 g by mouth daily. 06/12/17  Rodolph Bong, MD  senna-docusate (SENOKOT S) 8.6-50 MG tablet Take 1 tablet by mouth 2 (two) times daily. 06/12/17   Rodolph Bong, MD  sildenafil (REVATIO) 20 MG tablet Take 1 tablet (20 mg total) by mouth 3 (three) times daily. 06/07/17   Emilie Rutter, PA-C  thiamine 100 MG tablet Take 1 tablet (100 mg total) by mouth daily. 06/12/17   Rodolph Bong, MD  traMADol (ULTRAM) 50 MG tablet Take 1 tablet (50 mg total) by mouth every 6 (six) hours as needed for up to 10 doses. 06/07/17   Emilie Rutter, PA-C    Family History Family History  Problem Relation Age of Onset  . Hypertension Sister   . Hyperlipidemia Sister   . Diabetes Sister   . Peripheral Artery Disease Father     Social History Social History   Tobacco Use  . Smoking status: Current Every Day Smoker    Packs/day: 0.25    Years: 50.00    Pack years: 12.50    Types:  Cigarettes  . Smokeless tobacco: Never Used  Substance Use Topics  . Alcohol use: Yes    Alcohol/week: 4.8 oz    Types: 8 Cans of beer per week  . Drug use: No     Allergies   Penicillins   Review of Systems Review of Systems  Constitutional: Negative for appetite change, diaphoresis, fatigue, fever and unexpected weight change.  HENT: Negative for mouth sores.   Eyes: Negative for visual disturbance.  Respiratory: Negative for cough, chest tightness, shortness of breath and wheezing.   Cardiovascular: Negative for chest pain.  Gastrointestinal: Positive for abdominal distention and abdominal pain. Negative for constipation, diarrhea, nausea and vomiting.  Endocrine: Negative for polydipsia, polyphagia and polyuria.  Genitourinary: Negative for dysuria, frequency, hematuria and urgency.  Musculoskeletal: Negative for back pain and neck stiffness.  Skin: Negative for rash.  Allergic/Immunologic: Negative for immunocompromised state.  Neurological: Negative for syncope, light-headedness and headaches.  Hematological: Does not bruise/bleed easily.  Psychiatric/Behavioral: Negative for sleep disturbance. The patient is not nervous/anxious.      Physical Exam Updated Vital Signs BP 112/77   Pulse (!) 105   Ht 5\' 3"  (1.6 m)   Wt 50.3 kg (111 lb)   SpO2 (!) 80%   BMI 19.66 kg/m   Physical Exam  Constitutional: He appears well-developed and well-nourished. No distress.  Awake, alert, nontoxic appearance  HENT:  Head: Normocephalic and atraumatic.  Mouth/Throat: Oropharynx is clear and moist. No oropharyngeal exudate.  Eyes: Conjunctivae are normal. No scleral icterus.  Neck: Normal range of motion. Neck supple.  Cardiovascular: Normal rate, regular rhythm and intact distal pulses.  Pulmonary/Chest: Effort normal and breath sounds normal. No respiratory distress. He has no wheezes.  Equal chest expansion  Abdominal: Soft. Bowel sounds are normal. He exhibits no mass.  There is tenderness in the right lower quadrant, periumbilical area and suprapubic area. There is no rebound and no guarding.  Tenderness to palpation with mild distention Surgical incision clean and well healing with staples in place.  No drainage.  Musculoskeletal: Normal range of motion. He exhibits no edema.  Neurological: He is alert.  Speech is clear and goal oriented Moves extremities without ataxia  Skin: Skin is warm and dry. He is not diaphoretic.  Psychiatric: He has a normal mood and affect.  Nursing note and vitals reviewed.    ED Treatments / Results  Labs (all labs ordered are listed, but only abnormal results are displayed) Labs  Reviewed  CBC WITH DIFFERENTIAL/PLATELET - Abnormal; Notable for the following components:      Result Value   RBC 3.77 (*)    Hemoglobin 12.4 (*)    HCT 37.0 (*)    All other components within normal limits  COMPREHENSIVE METABOLIC PANEL - Abnormal; Notable for the following components:   CO2 21 (*)    BUN <5 (*)    Calcium 8.0 (*)    Total Protein 5.7 (*)    Albumin 2.5 (*)    ALT 13 (*)    All other components within normal limits  BRAIN NATRIURETIC PEPTIDE - Abnormal; Notable for the following components:   B Natriuretic Peptide 279.4 (*)    All other components within normal limits  PROTIME-INR    EKG EKG Interpretation  Date/Time:  Saturday June 19 2017 03:02:21 EDT Ventricular Rate:  103 PR Interval:    QRS Duration: 91 QT Interval:  383 QTC Calculation: 502 R Axis:   96 Text Interpretation:  Sinus tachycardia Right axis deviation Borderline repolarization abnormality Prolonged QT interval T waves now upright  Confirmed by Glynn Octave [phone]) on 06/19/2017 3:16:51 AM   Radiology Dg Chest Portable 1 View  Result Date: 06/19/2017 CLINICAL DATA:  Shortness of breath tonight. EXAM: PORTABLE CHEST 1 VIEW COMPARISON:  Radiographs 06/03/2017, chest CT 06/11/2017 FINDINGS: Mild cardiomegaly is unchanged. Aortic  atherosclerosis unchanged. No consolidation, pleural effusion or pneumothorax. Mild upper lobe emphysema. Surgical staples in the midline of the upper abdomen again seen. IMPRESSION: 1. No acute chest finding. 2. Mild cardiomegaly, unchanged. 3. Aortic Atherosclerosis (ICD10-I70.0) and Emphysema (ICD10-J43.9). Electronically Signed   By: Rubye Oaks M.D.   On: 06/19/2017 03:37    Procedures Procedures (including critical care time)  Medications Ordered in ED Medications  nicotine (NICODERM CQ - dosed in mg/24 hours) patch 21 mg (21 mg Transdermal Patch Applied 06/19/17 0346)     Initial Impression / Assessment and Plan / ED Course  I have reviewed the triage vital signs and the nursing notes.  Pertinent labs & imaging results that were available during my care of the patient were reviewed by me and considered in my medical decision making (see chart for details).  Clinical Course as of Jun 20 511  Sat Jun 19, 2017  0310 Tachycardic on arrival  Pulse Rate(!): 105 [HM]  0310 Patient hypoxic on arrival, placed on 4.5 L via nasal cannula with significant improvement.  SpO2(!): 80 % [HM]  0512 Discussed with Dr. Antionette Char who will admit.   [HM]    Clinical Course User Index [HM] Devere Brem, Dahlia Client, [place]    Patient presents with abdominal pain and likely small bowel obstruction versus ileus found on CT scan at outside facility.  He is tachycardic and hypoxic on arrival.  Tachycardia resolved almost immediately with his pain.  Patient remains on oxygen but does not appear short of breath.  No sensory muscle usage.  Labs with slightly elevated BNP.  Chest x-ray without evidence of pulmonary edema, pneumonia or pneumothorax.  I personally evaluated these images.  No leukocytosis.  I doubt his complication is infectious in nature.  NG tube was placed with significant improvement in patient's pain.  He will be admitted to the hospitalist service.  Final Clinical Impressions(s) / ED Diagnoses     Final diagnoses:  SBO (small bowel obstruction) (HCC)  Postprocedural partial intestinal obstruction    ED Discharge Orders    None       Milta Deiters 06/19/17 1914  Glynn Octave, MD 06/19/17 [phone]

## 2017-06-19 NOTE — Progress Notes (Addendum)
[place] NOTE    ASTOR DOLCH  FTD:[id number] DOB: 1956-01-18 DOA: 06/19/2017 PCP: Elinor Dodge, [place]   Brief Narrative: Patient is a 62 year old male with past medical history of COPD, chronic systolic CHF, alcohol dependence, peripheral artery disease status post recent aortobifemoral bypass who presented to the emergency department for the evaluation of abdominal pain and nausea.  Reported that he developed abdominal pain about 4 days ago.  He has been having abdominal pain on and off since the  the surgery.  CT of the abdomen and pelvis done on presentation showed patent aorta bifemoral bypass grafts, 3 cm hyper density anterior to the left common femoral artery and diffuse fluid-filled distention of small bowel with dilation of small bowel with transition point not  identified likely reflecting ileus or early/partial SBO. Patient was admitted for the management of partial small bowel obstruction.  Patient started on conservative management with NG tube placement and n.p.o. Status.  Vascular surgery is also following.  Assessment & Plan:   Principal Problem:   Partial small bowel obstruction (HCC) Active Problems:   Chronic systolic heart failure (HCC)   COPD (chronic obstructive pulmonary disease) (HCC)   Hypertension   ETOH abuse  Partial small bowel obstruction : On conservative management.  Continue n.p.o.  Continue NG tube.  Continue IV fluids. As needed antiemetics, analgesics.  Serial abdominal examination.    Might get abdominal x-ray tomorrow.  Status post aorto bifemoral bypass: Vascular surgery following.  Aortobifemoral bypass was done on 06/01/17. CTA abd/pelvis in ED with patent grafts, 3 cm hypodensity near left CFA suggestive of fluid collection or thrombosed pseudoaneurysm. Will resume statin aspirin once NG tube out.  Chronic systolic CHF: Appeared hypovolemic on presentation.  Lasix held.  IV fluids will be continued cautiously.  History of alcohol  abuse: No signs of withdrawal on admission.  Started on CIWA protocol.  History of COPD: Respiratory status stable .  Continue bronchodilators.  Prolonged QT: QTC was slightly prolonged to 502 ms on admission.  Continue monitoring telemetry.  Will check electrolytes.    DVT prophylaxis: Lovenox Code Status: Full Family Communication: [name] present at the bedside Disposition Plan: Home after resolution of small bowel obstruction   Consultants: Vascular surgery  Procedures: [name]  Antimicrobials: [name]  Subjective:  Patient seen and examined the bedside this morning.  He was sleeping and drowsy.   it was reported that he was at given Ativan last night because of agitation?  Objective: Vitals:   06/19/17 1415 06/19/17 1430 06/19/17 1442 06/19/17 1515  BP: (!) 151/89 (!) 143/92  (!) 142/81  Pulse: 89 90  85  Resp: 18 18  16   Temp:   99.1 F (37.3 C)   TempSrc:   Oral   SpO2: 97% 94%  96%  Weight:      Height:       No intake or output data in the 24 hours ending 06/19/17 1550 Filed Weights   06/19/17 0306  Weight: 50.3 kg (111 lb)    Examination:  General exam: Appears sleepy, drowsy HEENT:PERRL,Oral mucosa moist, Ear/Nose normal on gross exam Respiratory system: Bilateral equal air entry, normal vesicular breath sounds, no wheezes or crackles  Cardiovascular system: S1 & S2 heard, RRR. No JVD, murmurs, rubs, gallops or clicks. No pedal edema. Gastrointestinal system: Abdomen is nondistended, soft and nontender. No organomegaly or masses felt.  Bowel sounds very sluggish.  Linear abdominal surgical scar with staples Central nervous system: Alert and oriented. No focal neurological deficits.  Extremities: No edema, no clubbing ,no cyanosis, distal peripheral pulses palpable. Skin: No rashes, lesions or ulcers,no icterus ,no pallor   Data Reviewed: I have personally reviewed following labs and imaging studies  CBC: Recent Labs  Lab 06/19/17 0354  WBC 6.3  NEUTROABS  4.0  HGB 12.4*  HCT 37.0*  MCV 98.1  PLT 249   Basic Metabolic Panel: Recent Labs  Lab 06/19/17 0354  NA 135  K 3.6  CL 106  CO2 21*  GLUCOSE 90  BUN <5*  CREATININE 0.79  CALCIUM 8.0*   GFR: Estimated Creatinine Clearance: 67.2 mL/min (by C-G formula based on SCr of 0.79 mg/dL). Liver Function Tests: Recent Labs  Lab 06/19/17 0354  AST 18  ALT 13*  ALKPHOS 86  BILITOT 0.4  PROT 5.7*  ALBUMIN 2.5*   No results for input(s): LIPASE, AMYLASE in the last 168 hours. No results for input(s): AMMONIA in the last 168 hours. Coagulation Profile: Recent Labs  Lab 06/19/17 0354  INR 1.21   Cardiac Enzymes: No results for input(s): CKTOTAL, CKMB, CKMBINDEX, TROPONINI in the last 168 hours. BNP (last 3 results) No results for input(s): PROBNP in the last 8760 hours. HbA1C: No results for input(s): HGBA1C in the last 72 hours. CBG: No results for input(s): GLUCAP in the last 168 hours. Lipid Profile: No results for input(s): CHOL, HDL, LDLCALC, TRIG, CHOLHDL, LDLDIRECT in the last 72 hours. Thyroid Function Tests: No results for input(s): TSH, T4TOTAL, FREET4, T3FREE, THYROIDAB in the last 72 hours. Anemia Panel: No results for input(s): VITAMINB12, FOLATE, FERRITIN, TIBC, IRON, RETICCTPCT in the last 72 hours. Sepsis Labs: No results for input(s): PROCALCITON, LATICACIDVEN in the last 168 hours.  No results found for this or any previous visit (from the past 240 hour(s)).       Radiology Studies: Dg Chest Portable 1 View  Result Date: 06/19/2017 CLINICAL DATA:  Shortness of breath tonight. EXAM: PORTABLE CHEST 1 VIEW COMPARISON:  Radiographs 06/03/2017, chest CT 06/11/2017 FINDINGS: Mild cardiomegaly is unchanged. Aortic atherosclerosis unchanged. No consolidation, pleural effusion or pneumothorax. Mild upper lobe emphysema. Surgical staples in the midline of the upper abdomen again seen. IMPRESSION: 1. No acute chest finding. 2. Mild cardiomegaly, unchanged. 3.  Aortic Atherosclerosis (ICD10-I70.0) and Emphysema (ICD10-J43.9). Electronically Signed   By: Rubye Oaks M.D.   On: 06/19/2017 03:37        Scheduled Meds: . enoxaparin (LOVENOX) injection  40 mg Subcutaneous Daily  . fentaNYL (SUBLIMAZE) injection  50 mcg Intravenous Once  . folic acid  1 mg Oral Daily  . LORazepam  0-4 mg Intravenous Q6H   Followed by  . [START ON 06/21/2017] LORazepam  0-4 mg Intravenous Q12H  . LORazepam  2 mg Intravenous Once  . mometasone-formoterol  2 puff Inhalation BID  . multivitamin with minerals  1 tablet Oral Daily  . nicotine  21 mg Transdermal Daily  . thiamine  100 mg Oral Daily   Or  . thiamine  100 mg Intravenous Daily   Continuous Infusions: . dextrose 5 % and 0.45 % NaCl with KCl 20 mEq/L 70 mL/hr at 06/19/17 0638     LOS: 0 days    Time spent: More than 50% of that time was spent in counseling and/or coordination of care.      Burnadette Pop, [place] [facility] Pager [phone]  If 7PM-7AM, please contact night-coverage [email or web address] Password [email or web address] 06/19/2017, 3:50 PM

## 2017-06-19 NOTE — H&P (Signed)
History and Physical    [place] [name] [name]:[id number] DOB: 1955/04/11 DOA: 06/19/2017  PCP: Elinor Dodge, MD   Patient coming from: Home, by way of [facility] ED   Chief Complaint: Abdominal pain, nausea  HPI: [place] [name] is a 62 y.o. male with medical history significant for COPD, chronic systolic CHF, alcohol dependence, and PAD status post recent aortobifemoral bypass, now presenting to the emergency department for evaluation of abdominal pain and nausea.  Patient reports that he developed pain in the lower and mid abdomen approximately 4 days ago and has experienced worsening in his symptoms since that time.  He also reports severe nausea.  He denies chest pain, shortness of breath, fevers, or chills.  [place] [facility] ED Course: Upon arrival to the ED, patient is found to be afebrile, saturating mid 90s on room air, slightly tachycardic, and vitals otherwise normal.  EKG features a sinus tachycardia with rate 103, RAD, and QTc interval of 502 ms.  Chest x-ray is negative for acute cardiopulmonary disease.  Chemistry panel is unremarkable and CBC is notable for a slight normocytic anemia.  BNP is elevated to 279, but much lower than priors.  CTA of the abdomen and pelvis reveals patent aortobifemoral bypass grafts and a 3 cm hypodensity anterior to the left CFA, possibly reflecting a fluid collection or thrombosed pseudoaneurysm.  Also noted on the CT is diffuse fluid-filled distention of small bowel with transition point not identified, and likely reflecting ileus or early/partial SBO.  Vascular surgery was consulted by the ED physician, but there did not appear to be in any acute vascular process.  General surgeon at [facility] was then consulted, but declined admission, recommending transfer to [facility].  Patient was transported to [facility] ED where NG tube was placed.  He remains hemodynamically stable and in no apparent respiratory distress, and he will be  admitted to the telemetry unit for ongoing evaluation and management of partial SBO.  Review of Systems:  All other systems reviewed and apart from HPI, are negative.  Past Medical History:  Diagnosis Date  . Abnormal pulmonary finding    a. preadmission note indicates pt followed by Dr. Bethanie Dicker for CHF, lung mass, COPD, pulmonary mycosis with plan for 3 mo f/u with CT by 03/2017 note.  . Alcohol abuse   . Anemia   . Arthritis    "right hip; lower back" (05/11/2017)  . Chronic systolic CHF (congestive heart failure) (HCC)    a. EF 20-25% by echo 2017. b. 40-45% by echo in 01/2017.  Marland Kitchen COPD (chronic obstructive pulmonary disease) (HCC)   . Dyspnea   . GERD (gastroesophageal reflux disease)   . History of blood transfusion 2004   "related to bleeding stomach ulcerts"  . History of stomach ulcers 2004  . Hypercholesterolemia   . Hypertension   . Malnutrition (HCC)   . Moderate mitral regurgitation   . Myocardial infarction [facility]) 2004   "think it was bleeding ulcers; not a heart attack" (05/11/2017), pt denies heart cath  . Peripheral vascular disease (HCC)   . Pre-diabetes   . Pulmonary hypertension (HCC)    a. severe pulm HTN by echo 01/2017.  . Tricuspid regurgitation 2017    Past Surgical History:  Procedure Laterality Date  . ABDOMINAL AORTOGRAM W/LOWER EXTREMITY N/A 04/01/2017   Procedure: ABDOMINAL AORTOGRAM W/LOWER EXTREMITY;  Surgeon: Fransisco Hertz, MD;  Location: [facility] INVASIVE CV LAB;  Service: Cardiovascular;  Laterality: N/A;  Bilateral  .  AORTA - BILATERAL FEMORAL ARTERY BYPASS GRAFT N/A 06/01/2017   Procedure: AORTA BIFEMORAL BYPASS GRAFT with Graft and Right Femoral Endartarectomy;  Surgeon: Fransisco Hertz, MD;  Location: [facility] OR;  Service: Vascular;  Laterality: N/A;  . COLONOSCOPY    . HEMORRHOIDECTOMY WITH HEMORRHOID BANDING    . MASS EXCISION Left 04/13/2017   Procedure: EXCISION EPIDERMAL INCLUSION CYST LEFT GROIN;  Surgeon: Fransisco Hertz, MD;  Location: [facility] OR;   Service: Vascular;  Laterality: Left;  . PERIPHERAL VASCULAR INTERVENTION Left    "groin; for clogged artery"     reports that he has been smoking cigarettes.  He has a 12.50 pack-year smoking history. He has never used smokeless tobacco. He reports that he drinks about 4.8 oz of alcohol per week. He reports that he does not use drugs.  Allergies  Allergen Reactions  . Penicillins Itching, Rash and Other (See Comments)    PATIENT HAS HAD A PCN REACTION WITH IMMEDIATE RASH, FACIAL/TONGUE/THROAT SWELLING, SOB, OR LIGHTHEADEDNESS WITH HYPOTENSION:  #  #  #  YES  #  #  #   Has patient had a PCN reaction causing severe rash involving mucus membranes or skin necrosis: No Has patient had a PCN reaction that required hospitalization: No Has patient had a PCN reaction occurring within the last 10 years: No If all of the above answers are "NO", then may proceed with Cephalosporin use.     Family History  Problem Relation Age of Onset  . Hypertension Sister   . Hyperlipidemia Sister   . Diabetes Sister   . Peripheral Artery Disease Father      Prior to Admission medications   Medication Sig Start Date End Date Taking? Authorizing Provider  acetaminophen (TYLENOL) 500 MG tablet Take 2 tablets (1,000 mg total) by mouth every 6 (six) hours as needed. Patient taking differently: Take 250 mg by mouth daily as needed for moderate pain or headache.  02/17/16   Roxy Horseman, PA-C  albuterol (PROVENTIL HFA;VENTOLIN HFA) 108 (90 Base) MCG/ACT inhaler Inhale 2 puffs into the lungs every 6 (six) hours as needed for wheezing or shortness of breath.    [provider]  aspirin EC 81 MG tablet Take 81 mg by mouth daily.    [provider]  atorvastatin (LIPITOR) 20 MG tablet Take 20 mg by mouth daily.    [provider]  budesonide-formoterol (SYMBICORT) 160-4.5 MCG/ACT inhaler Inhale 2 puffs into the lungs 2 (two) times daily.    [provider]  furosemide (LASIX)  20 MG tablet Take 1 tablet (20 mg total) by mouth daily. 05/15/17   Edsel Petrin, DO  Multiple Vitamin (MULTIVITAMIN WITH MINERALS) TABS tablet Take 1 tablet by mouth daily. 05/15/17   Mikhail, Nita Sells, DO  nicotine (NICODERM CQ - DOSED IN MG/24 HOURS) 21 mg/24hr patch Place 1 patch (21 mg total) onto the skin daily. 05/15/17   Mikhail, Nita Sells, DO  omeprazole (PRILOSEC) 20 MG capsule Take 1 capsule (20 mg total) by mouth daily as needed (for indegestion). 05/14/17   Mikhail, Nita Sells, DO  ondansetron (ZOFRAN ODT) 4 MG disintegrating tablet Take 1 tablet (4 mg total) by mouth every 8 (eight) hours as needed for nausea or vomiting. Patient taking differently: Take 4 mg by mouth every 8 (eight) hours as needed for nausea or vomiting (before pain medications).  01/27/16   Molpus, John, MD  polyethylene glycol [facility]) packet Take 17 g by mouth daily. 06/12/17   Rodolph Bong, MD  senna-docusate (  SENOKOT S) 8.6-50 MG tablet Take 1 tablet by mouth 2 (two) times daily. 06/12/17   Rodolph Bong, MD  sildenafil (REVATIO) 20 MG tablet Take 1 tablet (20 mg total) by mouth 3 (three) times daily. 06/07/17   Emilie Rutter, PA-C  thiamine 100 MG tablet Take 1 tablet (100 mg total) by mouth daily. 06/12/17   Rodolph Bong, MD  traMADol (ULTRAM) 50 MG tablet Take 1 tablet (50 mg total) by mouth every 6 (six) hours as needed for up to 10 doses. 06/07/17   Emilie Rutter, PA-C    Physical Exam: Vitals:   06/19/17 0345 06/19/17 0400 06/19/17 0415 06/19/17 0430  BP: 131/79 131/78 131/72 121/74  Pulse: 83 81 82 81  Resp: 13 11 12 15   SpO2: 98% 99% 100% 96%  Weight:      Height:          Constitutional: NAD, calm, appears uncomfortable Eyes: PERTLA, lids and conjunctivae normal ENMT: Mucous membranes are moist. Posterior pharynx clear of any exudate or lesions.   Neck: normal, supple, no masses, no thyromegaly Respiratory: clear to auscultation bilaterally, no wheezing, no crackles. Normal respiratory  effort.    Cardiovascular: S1 & S2 heard, regular rate and rhythm. No extremity edema.  Abdomen: Mild distension, tender in mid and lower abdomen. Soft, no rebound pain or guarding. Rare bowel sound heard.   Musculoskeletal: no clubbing / cyanosis. No joint deformity upper and lower extremities.  Skin: no significant rashes, lesions, ulcers. Warm, dry, well-perfused. Neurologic: CN 2-12 grossly intact. Sensation intact. Strength 5/5 in all 4 limbs.  Psychiatric: Alert and oriented x 3. Calm, cooperative.     Labs on Admission: I have personally reviewed following labs and imaging studies  CBC: Recent Labs  Lab 06/19/17 0354  WBC 6.3  NEUTROABS 4.0  HGB 12.4*  HCT 37.0*  MCV 98.1  PLT 249   Basic Metabolic Panel: Recent Labs  Lab 06/19/17 0354  NA 135  K 3.6  CL 106  CO2 21*  GLUCOSE 90  BUN <5*  CREATININE 0.79  CALCIUM 8.0*   GFR: Estimated Creatinine Clearance: 67.2 mL/min (by C-G formula based on SCr of 0.79 mg/dL). Liver Function Tests: Recent Labs  Lab 06/19/17 0354  AST 18  ALT 13*  ALKPHOS 86  BILITOT 0.4  PROT 5.7*  ALBUMIN 2.5*   No results for input(s): LIPASE, AMYLASE in the last 168 hours. No results for input(s): AMMONIA in the last 168 hours. Coagulation Profile: Recent Labs  Lab 06/19/17 0354  INR 1.21   Cardiac Enzymes: No results for input(s): CKTOTAL, CKMB, CKMBINDEX, TROPONINI in the last 168 hours. BNP (last 3 results) No results for input(s): PROBNP in the last 8760 hours. HbA1C: No results for input(s): HGBA1C in the last 72 hours. CBG: No results for input(s): GLUCAP in the last 168 hours. Lipid Profile: No results for input(s): CHOL, HDL, LDLCALC, TRIG, CHOLHDL, LDLDIRECT in the last 72 hours. Thyroid Function Tests: No results for input(s): TSH, T4TOTAL, FREET4, T3FREE, THYROIDAB in the last 72 hours. Anemia Panel: No results for input(s): VITAMINB12, FOLATE, FERRITIN, TIBC, IRON, RETICCTPCT in the last 72 hours. Urine  analysis:    Component Value Date/Time   COLORURINE YELLOW 06/11/2017 1642   APPEARANCEUR CLEAR 06/11/2017 1642   LABSPEC <1.005 (L) 06/11/2017 1642   PHURINE 7.0 06/11/2017 1642   GLUCOSEU NEGATIVE 06/11/2017 1642   HGBUR NEGATIVE 06/11/2017 1642   BILIRUBINUR NEGATIVE 06/11/2017 1642   KETONESUR NEGATIVE 06/11/2017 1642  PROTEINUR NEGATIVE 06/11/2017 1642   NITRITE NEGATIVE 06/11/2017 1642   LEUKOCYTESUR NEGATIVE 06/11/2017 1642   Sepsis Labs: @LABRCNTIP (procalcitonin:4,lacticidven:4) )No results found for this or any previous visit (from the past 240 hour(s)).   Radiological Exams on Admission: Dg Chest Portable 1 View  Result Date: 06/19/2017 CLINICAL DATA:  Shortness of breath tonight. EXAM: PORTABLE CHEST 1 VIEW COMPARISON:  Radiographs 06/03/2017, chest CT 06/11/2017 FINDINGS: Mild cardiomegaly is unchanged. Aortic atherosclerosis unchanged. No consolidation, pleural effusion or pneumothorax. Mild upper lobe emphysema. Surgical staples in the midline of the upper abdomen again seen. IMPRESSION: 1. No acute chest finding. 2. Mild cardiomegaly, unchanged. 3. Aortic Atherosclerosis (ICD10-I70.0) and Emphysema (ICD10-J43.9). Electronically Signed   By: Rubye Oaks M.D.   On: 06/19/2017 03:37    EKG: Independently reviewed. Sinus tachycardia (rate 103), RAD, QTc 502.   Assessment/Plan   1. Partial SBO  - Presents with 4 days of worsening abdominal pain, nausea  - CT findings suggest early/partial SBO  - Surgeon reviewed films in ED, does not feel is surgical  - NGT placed in ED  - Continue bowel rest, NGT decompression, gentle IVF hydration, prn antiemetics, prn analgesia, serial exams    2. Chronic systolic CHF  - Appears hypovolemic on admission  - Hold Lasix, provide a cautious IVF hydration while NPO  - Follow daily wt and I/O's    3. COPD  - No wheezing, cough, or SOB on admission  - Continue ICS/LABA and prn albuterol    4. PAD - Status-post aortobifemoral  bypass on 06/01/17  - CTA abd/pelvis in ED with patent grafts, 3 cm hypodensity near left CFA suggestive of fluid collection or thrombosed pseudoaneurysm  - Feet are warm and well-perfused  - Resume statin and ASA once NGT out    5. Alcohol dependence  - No signs of withdrawal on admission - Monitor with CIWA and prn Ativan  - Monitor lytes, supplement vitamins and minerals    6. Prolonged QT - QTc is slightly prolonged to 502 ms  - Continue cardiac monitoring, replete potassium and magnesium, minimize offending medications    DVT prophylaxis: Lovenox Code Status: Full  Family Communication: Discussed with patient Consults called: None Admission status: Inpatient    Briscoe Deutscher, MD [facility] Pager [phone]  If 7PM-7AM, please contact night-coverage [email or web address] Password [email or web address]  06/19/2017, 5:27 AM

## 2017-06-19 NOTE — ED Notes (Signed)
The pt has an ive in his rt a-c  No idea who started it  The pt reports that someone at [facility] started the iv  No documentation in epic

## 2017-06-19 NOTE — ED Notes (Signed)
Pt asking for ng tube to be removed  He reports that he cannot rest

## 2017-06-19 NOTE — ED Triage Notes (Addendum)
The pt arrived from [facility] by air care.  abd pain today  He has surgery here June 01 2017  A c-t of the pts abd was done at [facility] and sent here for the same  Pr alert oriented skin warm and dry  Correction ot the above statement.  The [facility] [place]  [facility]  Notes from [facility]  Were from another time recentlt

## 2017-06-19 NOTE — ED Notes (Signed)
The pt insists that he was told by the admitting doctor that he could take  The ng tube out  Dr was called  It can be removed if the pt will not keep it in

## 2017-06-19 NOTE — Consult Note (Signed)
Hospital Consult    Reason for Consult:  Abdominal pain Referring Physician:  ED MRN #:  [id number]  History of Present Illness: This is a 62 y.o. male with recent history of aortobifemoral bypass.  He was again recently seen 1 week ago for abdominal pain and had a CT scan which did not show any complicating features from his recent surgery.  He now presented again to the [facility] emergency department for right lower extremity abdominal pain and underwent CT angios.  He does have thickened bowel and concern for small bowel obstruction has been transferred to our emergency department.  On my examination of him he is arousable and alert however he is tired and not incredibly talkative.  He does state that his abdominal pain post NG tube placement is improved.  He is not complaining of any abdominal pain at this time.  States that his last bowel movement was Friday and was nonbloody.  Past Medical History:  Diagnosis Date  . Abnormal pulmonary finding    a. preadmission note indicates pt followed by Dr. Bethanie Dicker for CHF, lung mass, COPD, pulmonary mycosis with plan for 3 mo f/u with CT by 03/2017 note.  . Alcohol abuse   . Anemia   . Arthritis    "right hip; lower back" (05/11/2017)  . Chronic systolic CHF (congestive heart failure) (HCC)    a. EF 20-25% by echo 2017. b. 40-45% by echo in 01/2017.  Marland Kitchen COPD (chronic obstructive pulmonary disease) (HCC)   . Dyspnea   . GERD (gastroesophageal reflux disease)   . History of blood transfusion 2004   "related to bleeding stomach ulcerts"  . History of stomach ulcers 2004  . Hypercholesterolemia   . Hypertension   . Malnutrition (HCC)   . Moderate mitral regurgitation   . Myocardial infarction [facility]) 2004   "think it was bleeding ulcers; not a heart attack" (05/11/2017), pt denies heart cath  . Peripheral vascular disease (HCC)   . Pre-diabetes   . Pulmonary hypertension (HCC)    a. severe pulm HTN by echo 01/2017.  . Tricuspid  regurgitation 2017    Past Surgical History:  Procedure Laterality Date  . ABDOMINAL AORTOGRAM W/LOWER EXTREMITY N/A 04/01/2017   Procedure: ABDOMINAL AORTOGRAM W/LOWER EXTREMITY;  Surgeon: Fransisco Hertz, MD;  Location: [facility] INVASIVE CV LAB;  Service: Cardiovascular;  Laterality: N/A;  Bilateral  . AORTA - BILATERAL FEMORAL ARTERY BYPASS GRAFT N/A 06/01/2017   Procedure: AORTA BIFEMORAL BYPASS GRAFT with Graft and Right Femoral Endartarectomy;  Surgeon: Fransisco Hertz, MD;  Location: [facility] OR;  Service: Vascular;  Laterality: N/A;  . COLONOSCOPY    . HEMORRHOIDECTOMY WITH HEMORRHOID BANDING    . MASS EXCISION Left 04/13/2017   Procedure: EXCISION EPIDERMAL INCLUSION CYST LEFT GROIN;  Surgeon: Fransisco Hertz, MD;  Location: [facility] OR;  Service: Vascular;  Laterality: Left;  . PERIPHERAL VASCULAR INTERVENTION Left    "groin; for clogged artery"    Allergies  Allergen Reactions  . Penicillins Itching, Rash and Other (See Comments)    PATIENT HAS HAD A PCN REACTION WITH IMMEDIATE RASH, FACIAL/TONGUE/THROAT SWELLING, SOB, OR LIGHTHEADEDNESS WITH HYPOTENSION:  #  #  #  YES  #  #  #   Has patient had a PCN reaction causing severe rash involving mucus membranes or skin necrosis: No Has patient had a PCN reaction that required hospitalization: No Has patient had a PCN reaction occurring within the last 10 years: No If all of the above answers  are "NO", then may proceed with Cephalosporin use.     Prior to Admission medications   Medication Sig Start Date End Date Taking? Authorizing Provider  acetaminophen (TYLENOL) 500 MG tablet Take 2 tablets (1,000 mg total) by mouth every 6 (six) hours as needed. Patient taking differently: Take 250 mg by mouth daily as needed for moderate pain or headache.  02/17/16  Yes Roxy Horseman, PA-C  albuterol (PROVENTIL HFA;VENTOLIN HFA) 108 (90 Base) MCG/ACT inhaler Inhale 2 puffs into the lungs every 6 (six) hours as needed for wheezing or shortness of breath.   Yes  [provider]  aspirin EC 81 MG tablet Take 81 mg by mouth daily.   Yes [provider]  atorvastatin (LIPITOR) 20 MG tablet Take 20 mg by mouth daily.   Yes [provider]  budesonide-formoterol (SYMBICORT) 160-4.5 MCG/ACT inhaler Inhale 2 puffs into the lungs 2 (two) times daily.   Yes [provider]  furosemide (LASIX) 20 MG tablet Take 1 tablet (20 mg total) by mouth daily. 05/15/17  Yes Mikhail, [place], DO  Multiple Vitamin (MULTIVITAMIN WITH MINERALS) TABS tablet Take 1 tablet by mouth daily. 05/15/17  Yes Mikhail, Maryann, DO  nicotine (NICODERM CQ - DOSED IN MG/24 HOURS) 21 mg/24hr patch Place 1 patch (21 mg total) onto the skin daily. 05/15/17  Yes Mikhail, Nita Sells, DO  omeprazole (PRILOSEC) 20 MG capsule Take 1 capsule (20 mg total) by mouth daily as needed (for indegestion). 05/14/17  Yes Mikhail, Maryann, DO  ondansetron (ZOFRAN ODT) 4 MG disintegrating tablet Take 1 tablet (4 mg total) by mouth every 8 (eight) hours as needed for nausea or vomiting. Patient taking differently: Take 4 mg by mouth every 8 (eight) hours as needed for nausea or vomiting (before pain medications).  01/27/16  Yes Molpus, John, MD  polyethylene glycol [facility]) packet Take 17 g by mouth daily. 06/12/17  Yes Rodolph Bong, MD  senna-docusate (SENOKOT S) 8.6-50 MG tablet Take 1 tablet by mouth 2 (two) times daily. 06/12/17  Yes Rodolph Bong, MD  sildenafil (REVATIO) 20 MG tablet Take 1 tablet (20 mg total) by mouth 3 (three) times daily. 06/07/17  Yes Emilie Rutter, PA-C  thiamine 100 MG tablet Take 1 tablet (100 mg total) by mouth daily. 06/12/17  Yes Rodolph Bong, MD  traMADol (ULTRAM) 50 MG tablet Take 1 tablet (50 mg total) by mouth every 6 (six) hours as needed for up to 10 doses. 06/07/17  Yes Emilie Rutter, PA-C    Social History   Socioeconomic History  . Marital status: Married    Spouse name: Not on file  . Number of children: Not on file  . Years of  education: Not on file  . Highest education level: Not on file  Occupational History  . Not on file  Social Needs  . Financial resource strain: Not on file  . Food insecurity:    Worry: Not on file    Inability: Not on file  . Transportation needs:    Medical: Not on file    Non-medical: Not on file  Tobacco Use  . Smoking status: Current Every Day Smoker    Packs/day: 0.25    Years: 50.00    Pack years: 12.50    Types: Cigarettes  . Smokeless tobacco: Never Used  Substance and Sexual Activity  . Alcohol use: Yes    Alcohol/week: 4.8 oz    Types: 8 Cans of beer per week  . Drug use: No  .  Sexual activity: Not Currently  Lifestyle  . Physical activity:    Days per week: Not on file    Minutes per session: Not on file  . Stress: Not on file  Relationships  . Social connections:    Talks on phone: Not on file    Gets together: Not on file    Attends religious service: Not on file    Active member of club or organization: Not on file    Attends meetings of clubs or organizations: Not on file    Relationship status: Not on file  . Intimate partner violence:    Fear of current or ex partner: Not on file    Emotionally abused: Not on file    Physically abused: Not on file    Forced sexual activity: Not on file  Other Topics Concern  . Not on file  Social History Narrative  . Not on file     Family History  Problem Relation Age of Onset  . Hypertension Sister   . Hyperlipidemia Sister   . Diabetes Sister   . Peripheral Artery Disease Father     ROS:   Cardiovascular: []  chest pain/pressure []  palpitations []  SOB lying flat []  DOE []  pain in legs while walking []  pain in legs at rest []  pain in legs at night []  non-healing ulcers []  hx of DVT []  swelling in legs  Pulmonary: []  productive cough []  asthma/wheezing []  home O2  Neurologic: []  weakness in []  arms []  legs []  numbness in []  arms []  legs []  hx of CVA []  mini stroke [] difficulty speaking  or slurred speech []  temporary loss of vision in one eye []  dizziness  Hematologic: []  hx of cancer []  bleeding problems []  problems with blood clotting easily  Endocrine:   []  diabetes []  thyroid disease  GI [x]  abdominal pain []  blood in stool  GU: []  CKD/renal failure []  HD--[]  M/W/F or []  T/T/S []  burning with urination []  blood in urine  Psychiatric: []  anxiety []  depression  Musculoskeletal: []  arthritis []  joint pain  Integumentary: []  rashes []  ulcers  Constitutional: []  fever []  chills   Physical Examination  Vitals:   06/19/17 0600 06/19/17 0630  BP: 138/80 131/70  Pulse: 78 82  Resp: 15 12  SpO2: 98% 97%   Body mass index is 19.66 kg/m.  General:  nad HENT: WNL, normocephalic Pulmonary: normal non-labored breathing Cardiac: Palpable femoral pulses bilaterally Abdomen: Soft midline incision with staples no distention no pain to palpation.   Musculoskeletal:Bilateral groins are clean dry and intact There is a palpable fluid collection in the left incision that is nonpulsatile Neurologic:  he is awake and alert at this time but is minimally talkative   CBC    Component Value Date/Time   WBC 6.3 06/19/2017 0354   RBC 3.77 (L) 06/19/2017 0354   HGB 12.4 (L) 06/19/2017 0354   HCT 37.0 (L) 06/19/2017 0354   PLT 249 06/19/2017 0354   MCV 98.1 06/19/2017 0354   MCH 32.9 06/19/2017 0354   MCHC 33.5 06/19/2017 0354   RDW 14.3 06/19/2017 0354   LYMPHSABS 1.6 06/19/2017 0354   MONOABS 0.7 06/19/2017 0354   EOSABS 0.0 06/19/2017 0354   BASOSABS 0.0 06/19/2017 0354    BMET    Component Value Date/Time   NA 135 06/19/2017 0354   K 3.6 06/19/2017 0354   CL 106 06/19/2017 0354   CO2 21 (L) 06/19/2017 0354   GLUCOSE 90 06/19/2017 0354   BUN <5 (  L) 06/19/2017 0354   CREATININE 0.79 06/19/2017 0354   CALCIUM 8.0 (L) 06/19/2017 0354   GFRNONAA >60 06/19/2017 0354   GFRAA >60 06/19/2017 0354    COAGS: Lab Results  Component Value Date     INR 1.21 06/19/2017   INR 1.19 06/01/2017   INR 1.06 05/25/2017     I reviewed his CT angios which demonstrates patency of his aortobifemoral bypass.  There appears to be fluid collection in his left groin Small bowel is thickened.     ASSESSMENT/PLAN: This is a 62 y.o. male status post aortobifemoral bypass now presenting with abdominal pain for the second time in 2 weeks and has CT scan demonstrating ileus versus partial small bowel obstruction.  On exam he has an NG tube in place does not have any abdominal distention and his pain has resolved at this time.  The greatest concern at this time would be mesenteric ischemia but in reviewing the CT scan is celiac and SMA are patent knee appears to have backflow to his ligated inferior mesenteric artery making mesenteric ischemia much less likely.  He also had a bowel movement and does not endorse any bloody bowel movements at this time.  Agree with conservative management with bowel rest  Cathren Sween C. Randie Heinz, MD [facility] Office: [phone] Pager: [phone]

## 2017-06-19 NOTE — ED Notes (Signed)
The pt  Had of fentanyl iv by paramdeics on the way here  pts sats low of the o2   Nasal 02 at 3 liters

## 2017-06-20 ENCOUNTER — Inpatient Hospital Stay (HOSPITAL_COMMUNITY): Payer: Medicaid Other

## 2017-06-20 DIAGNOSIS — K7460 Unspecified cirrhosis of liver: Secondary | ICD-10-CM

## 2017-06-20 DIAGNOSIS — K56600 Partial intestinal obstruction, unspecified as to cause: Principal | ICD-10-CM

## 2017-06-20 IMAGING — DX DG ABDOMEN 1V
1 series · 1 of 1 positions shown · non-contrast
Comparison: CT [DATE]

CLINICAL DATA: Small bowel obstruction.

EXAM:
ABDOMEN - 1 VIEW

[abdomen kub]
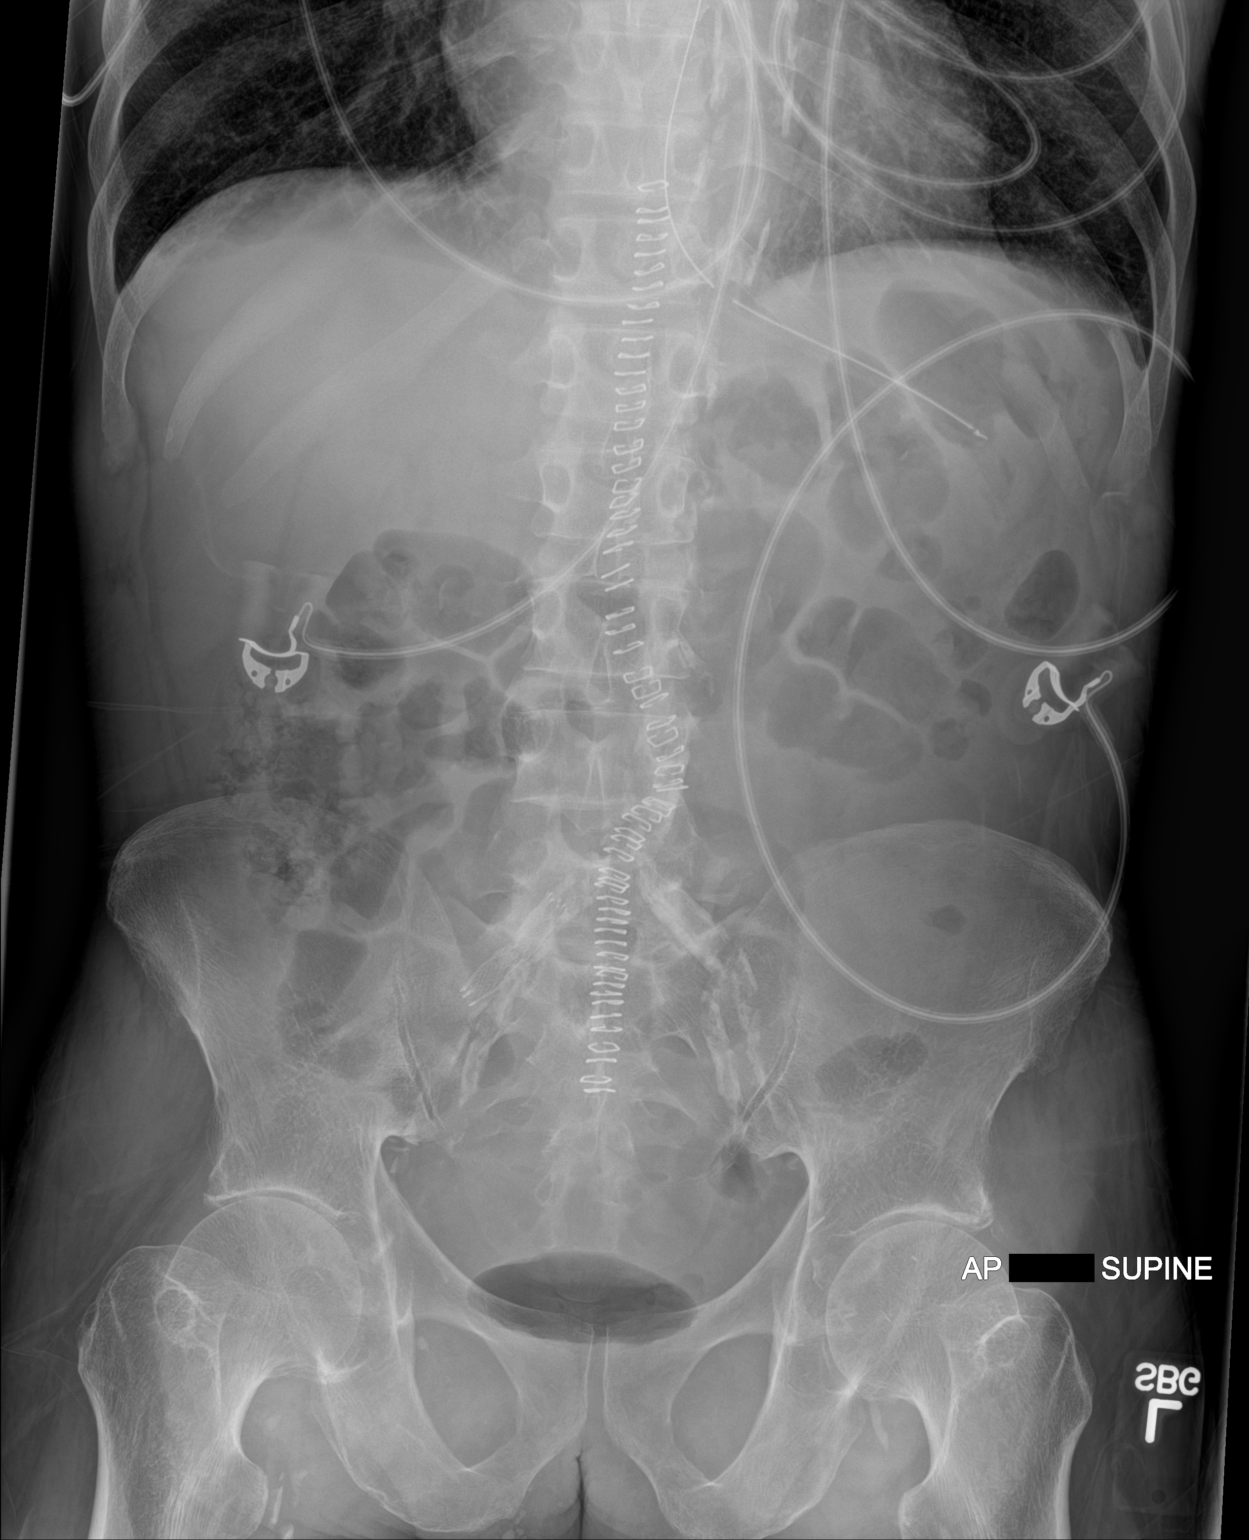

[1 of 1 positions shown; findings below may reference images not displayed]

FINDINGS: Again noted are surgical skin staples along the anterior abdomen and
pelvis. Vascular stent in the native right common iliac artery.
Extensive atherosclerotic calcifications involving the aorta and
iliac arteries. Nasogastric tube tip in the left upper abdomen and
probably in the gastric fundus or body region. Bowel gas throughout
the abdomen without significant bowel dilatation. Again noted is gas
within the rectum. Limited evaluation for free air on this supine
image.
IMPRESSION: Nasogastric tube in the proximal stomach.

Nonobstructive bowel gas pattern.

## 2017-06-20 MED ORDER — MAGIC MOUTHWASH
15.0000 mL | Freq: Four times a day (QID) | ORAL | Status: DC | PRN
  Filled 2017-06-20: qty 15

## 2017-06-20 MED ORDER — GUAIFENESIN-DM 100-10 MG/5ML PO SYRP
10.0000 mL | ORAL_SOLUTION | ORAL | Status: DC | PRN
Start: 2017-06-20 — End: 2017-06-21
  Administered 2017-06-20: 10 mL via ORAL
  Filled 2017-06-20: qty 10

## 2017-06-20 MED ORDER — BISACODYL 10 MG RE SUPP
10.0000 mg | Freq: Once | RECTAL | Status: DC
  Filled 2017-06-20 (×2): qty 1

## 2017-06-20 MED ORDER — HYDROCORTISONE 1 % EX CREA
1.0000 "application " | TOPICAL_CREAM | Freq: Three times a day (TID) | CUTANEOUS | Status: DC | PRN
  Filled 2017-06-20: qty 28

## 2017-06-20 MED ORDER — DIATRIZOATE MEGLUMINE & SODIUM 66-10 % PO SOLN
90.0000 mL | Freq: Once | ORAL | Status: AC
  Administered 2017-06-20: 90 mL via NASOGASTRIC
  Filled 2017-06-20: qty 90

## 2017-06-20 MED ORDER — PHENOL 1.4 % MT LIQD: 1.0000 | OROMUCOSAL | Status: DC | PRN

## 2017-06-20 MED ORDER — ALUM & MAG HYDROXIDE-SIMETH 200-200-20 MG/5ML PO SUSP: 30.0000 mL | Freq: Four times a day (QID) | ORAL | Status: DC | PRN

## 2017-06-20 MED ORDER — MENTHOL 3 MG MT LOZG
1.0000 | LOZENGE | OROMUCOSAL | Status: DC | PRN
  Filled 2017-06-20: qty 9

## 2017-06-20 MED ORDER — BISACODYL 10 MG RE SUPP: 10.0000 mg | Freq: Two times a day (BID) | RECTAL | Status: DC | PRN

## 2017-06-20 MED ORDER — LIP MEDEX EX OINT
1.0000 "application " | TOPICAL_OINTMENT | Freq: Two times a day (BID) | CUTANEOUS | Status: DC
  Administered 2017-06-21: 1 via TOPICAL
  Filled 2017-06-20: qty 7

## 2017-06-20 MED ORDER — HYDROCORTISONE 2.5 % RE CREA
1.0000 "application " | TOPICAL_CREAM | Freq: Four times a day (QID) | RECTAL | Status: DC | PRN
  Filled 2017-06-20: qty 28.35

## 2017-06-20 NOTE — Progress Notes (Signed)
Recent CXR indicates patient has pulled NG out to mid esophagus; NG replaced and confirmed per positive auscultation only.  Patient continues to await abdominal film at 1900.

## 2017-06-20 NOTE — Progress Notes (Signed)
NGT removed per order from MD and patient started on sips and chips.

## 2017-06-20 NOTE — Progress Notes (Signed)
Patient T101.9; Dr. Renford Dills, MD updated; new orders received.

## 2017-06-20 NOTE — Progress Notes (Signed)
PROGRESS NOTE    Wayne HEUPEL  [name]:[id number] DOB: 1955/11/22 DOA: 06/19/2017 PCP: Elinor Dodge, MD   Brief Narrative: Patient is a 62 year old male with past medical history of COPD, chronic systolic CHF, alcohol dependence, peripheral artery disease status post recent aortobifemoral bypass who presented to the emergency department for the evaluation of abdominal pain and nausea.  Reported that he developed abdominal pain about 4 days ago.  He has been having abdominal pain on and off since the  the surgery.  CT of the abdomen and pelvis done on presentation showed patent aorta bifemoral bypass grafts, 3 cm hyper density anterior to the left common femoral artery and diffuse fluid-filled distention of small bowel with dilation of small bowel with transition point not  identified likely reflecting ileus or early/partial SBO. Patient was admitted for the management of partial small bowel obstruction.  Patient started on conservative management with NG tube placement and n.p.o. Status.  Vascular surgery is also following. General surgery consulted today.  Assessment & Plan:   Principal Problem:   Partial small bowel obstruction (HCC) Active Problems:   Chronic systolic heart failure (HCC)   COPD (chronic obstructive pulmonary disease) (HCC)   Hypertension   ETOH abuse  Partial small bowel obstruction : On conservative management.  Continue n.p.o.  Continue NG tube.  Continue IV fluids. As needed antiemetics, analgesics.  Serial abdominal examination.    Abdominal x-ray done today shows nonobstructive bowel pattern.  Patient has not passed flatus.  Abdominal pain is better.  Bowel sounds very sluggish.  General surgery consulted.  Status post aorto bifemoral bypass: Vascular surgery following.  Aortobifemoral bypass was done on 06/01/17. CTA abd/pelvis in ED with patent grafts, 3 cm hypodensity near left CFA suggestive of fluid collection or thrombosed pseudoaneurysm. Will  resume statin aspirin once NG tube out.  Chronic systolic CHF: Appeared hypovolemic on presentation.  Lasix held.  IV fluids will be continued cautiously.  History of alcohol abuse: No signs of withdrawal on admission.  Started on CIWA protocol.  History of COPD: Respiratory status stable .  Continue bronchodilators.  Prolonged QT: QTC was slightly prolonged to 502 ms on admission.  Continue monitoring telemetry.  Will check electrolytes.    DVT prophylaxis: Lovenox Code Status: Full Family Communication: None present at the bedside Disposition Plan: Home after resolution of small bowel obstruction   Consultants: Vascular surgery  Procedures: None  Antimicrobials: None  Subjective:  Patient seen and examined the bedside this morning.  Denies any abdominal pain.  Complains of throat discomfort due to NG tube and wants it out. Objective: Vitals:   06/19/17 2313 06/20/17 0443 06/20/17 0733 06/20/17 0909  BP: (!) 143/88 139/80 134/84   Pulse: 95 94 93 94  Resp: 16 18 15  (!) 21  Temp: 99.4 F (37.4 C) 98.7 F (37.1 C) 99.1 F (37.3 C)   TempSrc: Oral Oral    SpO2: 98% 98% 100% 100%  Weight:  47.5 kg (104 lb 11.2 oz)    Height:        Intake/Output Summary (Last 24 hours) at 06/20/2017 1025 Last data filed at 06/20/2017 0927 Gross per 24 hour  Intake 930 ml  Output 825 ml  Net 105 ml   Filed Weights   06/19/17 0306 06/19/17 1515 06/20/17 0443  Weight: 50.3 kg (111 lb) 47.8 kg (105 lb 6.1 oz) 47.5 kg (104 lb 11.2 oz)    Examination:  General exam: Appears calm and comfortable ,Not in distress,average built HEENT:PERRL,Oral  mucosa moist, Ear/Nose normal on gross exam Respiratory system: Bilateral equal air entry, normal vesicular breath sounds, no wheezes or crackles  Cardiovascular system: S1 & S2 heard, RRR. No JVD, murmurs, rubs, gallops or clicks. Gastrointestinal system: Abdomen is nondistended, soft and nontender.Bowel sounds very sluggish.  Linear abdominal  surgical scar with staples  Central nervous system: Alert and oriented. No focal neurological deficits. Extremities: No edema, no clubbing ,no cyanosis, distal peripheral pulses palpable. Skin: No rashes, lesions or ulcers,no icterus ,no pallor MSK: Normal muscle bulk,tone ,power Psychiatry: Judgement and insight appear normal. Mood & affect appropriate.     Data Reviewed: I have personally reviewed following labs and imaging studies  CBC: Recent Labs  Lab 06/19/17 0354  WBC 6.3  NEUTROABS 4.0  HGB 12.4*  HCT 37.0*  MCV 98.1  PLT 249   Basic Metabolic Panel: Recent Labs  Lab 06/19/17 0354  NA 135  K 3.6  CL 106  CO2 21*  GLUCOSE 90  BUN <5*  CREATININE 0.79  CALCIUM 8.0*   GFR: Estimated Creatinine Clearance: 63.5 mL/min (by C-G formula based on SCr of 0.79 mg/dL). Liver Function Tests: Recent Labs  Lab 06/19/17 0354  AST 18  ALT 13*  ALKPHOS 86  BILITOT 0.4  PROT 5.7*  ALBUMIN 2.5*   No results for input(s): LIPASE, AMYLASE in the last 168 hours. No results for input(s): AMMONIA in the last 168 hours. Coagulation Profile: Recent Labs  Lab 06/19/17 0354  INR 1.21   Cardiac Enzymes: No results for input(s): CKTOTAL, CKMB, CKMBINDEX, TROPONINI in the last 168 hours. BNP (last 3 results) No results for input(s): PROBNP in the last 8760 hours. HbA1C: No results for input(s): HGBA1C in the last 72 hours. CBG: No results for input(s): GLUCAP in the last 168 hours. Lipid Profile: No results for input(s): CHOL, HDL, LDLCALC, TRIG, CHOLHDL, LDLDIRECT in the last 72 hours. Thyroid Function Tests: No results for input(s): TSH, T4TOTAL, FREET4, T3FREE, THYROIDAB in the last 72 hours. Anemia Panel: No results for input(s): VITAMINB12, FOLATE, FERRITIN, TIBC, IRON, RETICCTPCT in the last 72 hours. Sepsis Labs: No results for input(s): PROCALCITON, LATICACIDVEN in the last 168 hours.  No results found for this or any previous visit (from the past 240  hour(s)).       Radiology Studies: Dg Abd 1 View  Result Date: 06/20/2017 CLINICAL DATA:  Small bowel obstruction. EXAM: ABDOMEN - 1 VIEW COMPARISON:  CT 06/18/2017 FINDINGS: Again noted are surgical skin staples along the anterior abdomen and pelvis. Vascular stent in the native right common iliac artery. Extensive atherosclerotic calcifications involving the aorta and iliac arteries. Nasogastric tube tip in the left upper abdomen and probably in the gastric fundus or body region. Bowel gas throughout the abdomen without significant bowel dilatation. Again noted is gas within the rectum. Limited evaluation for free air on this supine image. IMPRESSION: Nasogastric tube in the proximal stomach. Nonobstructive bowel gas pattern. Electronically Signed   By: Richarda Overlie M.D.   On: 06/20/2017 09:25   Dg Chest Portable 1 View  Result Date: 06/19/2017 CLINICAL DATA:  Shortness of breath tonight. EXAM: PORTABLE CHEST 1 VIEW COMPARISON:  Radiographs 06/03/2017, chest CT 06/11/2017 FINDINGS: Mild cardiomegaly is unchanged. Aortic atherosclerosis unchanged. No consolidation, pleural effusion or pneumothorax. Mild upper lobe emphysema. Surgical staples in the midline of the upper abdomen again seen. IMPRESSION: 1. No acute chest finding. 2. Mild cardiomegaly, unchanged. 3. Aortic Atherosclerosis (ICD10-I70.0) and Emphysema (ICD10-J43.9). Electronically Signed   By: Shawna Orleans  Ehinger M.D.   On: 06/19/2017 03:37        Scheduled Meds: . bisacodyl  10 mg Rectal Once  . diatrizoate meglumine-sodium  90 mL Per NG tube Once  . enoxaparin (LOVENOX) injection  40 mg Subcutaneous Daily  . fentaNYL (SUBLIMAZE) injection  50 mcg Intravenous Once  . folic acid  1 mg Oral Daily  . LORazepam  0-4 mg Intravenous Q6H   Followed by  . [START ON 06/21/2017] LORazepam  0-4 mg Intravenous Q12H  . mometasone-formoterol  2 puff Inhalation BID  . multivitamin with minerals  1 tablet Oral Daily  . nicotine  21 mg  Transdermal Daily  . thiamine  100 mg Oral Daily   Or  . thiamine  100 mg Intravenous Daily   Continuous Infusions: . dextrose 5 % and 0.9% NaCl 75 mL/hr at 06/20/17 0600     LOS: 1 day    Time spent: More than 50% of that time was spent in counseling and/or coordination of care.      Burnadette Pop, MD [facility] Pager [phone]  If 7PM-7AM, please contact night-coverage [email or web address] Password [facility] 06/20/2017, 10:25 AM

## 2017-06-20 NOTE — Consult Note (Signed)
[address] [place], [place] [address] Phone: [phone] FAX: [phone]     Wayne [name]  Oct 11, 1955 [id number]  CARE TEAM:  PCP: Rocky Morel, MD  Outpatient Care Team: Patient Care Team: Rocky Morel, MD as PCP - General (Internal Medicine) Lennice Sites Otelia Limes, PA-C as Physician Assistant (Cardiology)  Inpatient Treatment Team: Treatment Team: Attending Provider: Shelly Coss, MD; Rounding Team: Sonda Primes, MD; Consulting Physician: Waynetta Sandy, MD; Registered Nurse: Thressa Sheller, RN; Registered Nurse: Roslyn Smiling, RN; Consulting Physician: Edison Pace, Md, MD   This patient is a 62 y.o.male who presents today for surgical evaluation at the request of Dr Tawanna Solo .   Chief complaint / Reason for evaluation: Abdominal pain distention.  Bowel obstruction versus ileus  62 year old male with cardiac and vascular issues.  Bilateral lower extremity rest pain.  Underwent aortobifemoral bypass and right femoral endarterectomy 06/01/2017 by Dr. Vallarie Mare with vascular surgery.  Eventually recovered but then returned with worsening abdominal pain.  CT scan underwhelming for any graft occlusion or other issues.  Then went to [facility] emergency room with lower abdominal pain.  General surgery evaluated and refused to admit the patient.  Recommended transfer to [facility] back with his vascular surgeon.  Some thickened small bowel concerning for ileus versus partial small bowel obstruction.  Admitted into the stepdown unit.  Nasogastric tube placed.  Surgical consultation requested to rule out bowel obstruction.    History of COPD.  Chronic systolic congestive heart failure, alcohol dependence.  Peripheral vascular arterial disease.  Normally moves his bowels most days.  Patient with constipation.  No history of Crohn's or colitis.  Concern for perhaps cirrhosis and ascites on abdominal  exploration.  No significant adhesions.  I do not believe he had any prior abdominal surgery.  Claims he is having flatus now.  Denies any abdominal pain.  More irritation around the nasogastric tube than anything else in his nose and throat    Assessment  Wayne [name]  62 y.o. male       Problem List:  Principal Problem:   Ileus  Active Problems:   Chronic systolic heart failure (HCC)   COPD (chronic obstructive pulmonary disease) (HCC)   Hypertension   ETOH abuse   Abdominal distention after aortobifemoral bypass surgery 2-1/2 weeks ago returning with ileus with numerous comorbidities.  No hard evidence of bowel obstruction.  Plan:  This looks more like ileus to me with some other etiology.  Stabilize him medically.  Small bowel protocol with nasogastric tube.  He just received NGT Gastrografin.  If contrast is in the colon at the 8-hour film as I suspect it will, OK to remove nasogastric tube in place him on sips.  D/w ICU RN whom agrees.  If not improved and continue protocol.  If markedly worsen, may benefit from operative reexploration although usually would like to hold off for the first 6 weeks anyway.  He is feeling better already.  Nasogastric tube not high volume.  -VTE prophylaxis- SCDs, etc -mobilize as tolerated to help recovery  45 minutes spent in review, evaluation, examination, counseling, and coordination of care.  More than 50% of that time was spent in counseling.  Adin Hector, M.D., F.A.C.S. Gastrointestinal and Minimally Invasive Surgery [facility] [address] [place], [place] [address] [phone] Main / Paging   06/20/2017      Past Medical History:  Diagnosis Date  . Abnormal pulmonary finding    a. preadmission note indicates pt followed by Dr. Gwenevere Ghazi for CHF, lung mass, COPD, pulmonary mycosis with plan for 3 mo f/u with CT by 03/2017 note.  . Alcohol abuse   . Anemia   . Arthritis     "right hip; lower back" (05/11/2017)  . Chronic systolic CHF (congestive heart failure) ([place])    a. EF 20-25% by echo 2017. b. 40-45% by echo in 01/2017.  Marland Kitchen COPD (chronic obstructive pulmonary disease) ([place])   . Dyspnea   . GERD (gastroesophageal reflux disease)   . History of blood transfusion 2004   "related to bleeding stomach ulcerts"  . History of stomach ulcers 2004  . Hypercholesterolemia   . Hypertension   . Malnutrition ([place])   . Moderate mitral regurgitation   . Myocardial infarction [facility]) 2004   "think it was bleeding ulcers; not a heart attack" (05/11/2017), pt denies heart cath  . Peripheral vascular disease ([place])   . Pre-diabetes   . Pulmonary hypertension ([place])    a. severe pulm HTN by echo 01/2017.  . Tricuspid regurgitation 2017    Past Surgical History:  Procedure Laterality Date  . ABDOMINAL AORTOGRAM W/LOWER EXTREMITY N/A 04/01/2017   Procedure: ABDOMINAL AORTOGRAM W/LOWER EXTREMITY;  Surgeon: Conrad Liberty, MD;  Location: [facility];  Service: Cardiovascular;  Laterality: N/A;  Bilateral  . AORTA - BILATERAL FEMORAL ARTERY BYPASS GRAFT N/A 06/01/2017   Procedure: AORTA BIFEMORAL BYPASS GRAFT with Graft and Right Femoral Endartarectomy;  Surgeon: Conrad Menlo, MD;  Location: [facility];  Service: Vascular;  Laterality: N/A;  . COLONOSCOPY    . HEMORRHOIDECTOMY WITH HEMORRHOID BANDING    . MASS EXCISION Left 04/13/2017   Procedure: EXCISION EPIDERMAL INCLUSION CYST LEFT GROIN;  Surgeon: Conrad [place], MD;  Location: [facility];  Service: Vascular;  Laterality: Left;  . PERIPHERAL VASCULAR INTERVENTION Left    "groin; for clogged artery"    Social History   Socioeconomic History  . Marital status: Married    Spouse name: Not on file  . Number of children: Not on file  . Years of education: Not on file  . Highest education level: Not on file  Occupational History  . Not on file  Social Needs  . Financial resource strain: Not on file  . Food insecurity:     Worry: Not on file    Inability: Not on file  . Transportation needs:    Medical: Not on file    Non-medical: Not on file  Tobacco Use  . Smoking status: Current Every Day Smoker    Packs/day: 0.25    Years: 50.00    Pack years: 12.50    Types: Cigarettes  . Smokeless tobacco: Never Used  Substance and Sexual Activity  . Alcohol use: Yes    Alcohol/week: 4.8 oz    Types: 8 Cans of beer per week  . Drug use: No  . Sexual activity: Not Currently  Lifestyle  . Physical activity:    Days per week: Not on file    Minutes per session: Not on file  . Stress: Not on file  Relationships  . Social connections:    Talks on phone: Not on file    Gets together: Not on file    Attends religious service: Not on file    Active member of club or organization: Not on file    Attends meetings of clubs or organizations: Not on  file    Relationship status: Not on file  . Intimate partner violence:    Fear of current or ex partner: Not on file    Emotionally abused: Not on file    Physically abused: Not on file    Forced sexual activity: Not on file  Other Topics Concern  . Not on file  Social History Narrative  . Not on file    Family History  Problem Relation Age of Onset  . Hypertension Sister   . Hyperlipidemia Sister   . Diabetes Sister   . Peripheral Artery Disease Father     Current Facility-Administered Medications  Medication Dose Route Frequency Provider Last Rate Last Dose  . acetaminophen (TYLENOL) tablet 650 mg  650 mg Oral Q6H PRN Opyd, Ilene Qua, MD       Or  . acetaminophen (TYLENOL) suppository 650 mg  650 mg Rectal Q6H PRN Opyd, Ilene Qua, MD      . albuterol (PROVENTIL) (2.5 MG/3ML) 0.083% nebulizer solution 2.5 mg  2.5 mg Inhalation Q4H PRN Opyd, Ilene Qua, MD      . alum & mag hydroxide-simeth (MAALOX/MYLANTA) 200-200-20 MG/5ML suspension 30 mL  30 mL Oral Q6H PRN Michael Boston, MD      . bisacodyl (DULCOLAX) suppository 10 mg  10 mg Rectal Once Saverio Danker,  PA-C      . bisacodyl (DULCOLAX) suppository 10 mg  10 mg Rectal Q12H PRN Michael Boston, MD      . dextrose 5 %-0.9 % sodium chloride infusion   Intravenous Continuous Shelly Coss, MD 75 mL/hr at 06/20/17 0600    . enoxaparin (LOVENOX) injection 40 mg  40 mg Subcutaneous Daily Opyd, Ilene Qua, MD   40 mg at 06/19/17 1438  . fentaNYL (SUBLIMAZE) injection 25-50 mcg  25-50 mcg Intravenous Q2H PRN Opyd, Ilene Qua, MD   50 mcg at 06/20/17 0559  . fentaNYL (SUBLIMAZE) injection 50 mcg  50 mcg Intravenous Once Muthersbaugh, Hannah, PA-C      . folic acid (FOLVITE) tablet 1 mg  1 mg Oral Daily Opyd, Ilene Qua, MD      . guaiFENesin-dextromethorphan (ROBITUSSIN DM) 100-10 MG/5ML syrup 10 mL  10 mL Oral Q4H PRN Michael Boston, MD      . hydrocortisone (ANUSOL-HC) 2.5 % rectal cream 1 application  1 application Topical QID PRN Michael Boston, MD      . hydrocortisone cream 1 % 1 application  1 application Topical TID PRN Michael Boston, MD      . lip balm (CARMEX) ointment 1 application  1 application Topical BID Quartez Lagos, Remo Lipps, MD      . LORazepam (ATIVAN) injection 0-4 mg  0-4 mg Intravenous Q6H Opyd, Ilene Qua, MD       Followed by  . [START ON 06/21/2017] LORazepam (ATIVAN) injection 0-4 mg  0-4 mg Intravenous Q12H Opyd, Timothy S, MD      . LORazepam (ATIVAN) tablet 1 mg  1 mg Oral Q6H PRN Opyd, Ilene Qua, MD       Or  . LORazepam (ATIVAN) injection 1 mg  1 mg Intravenous Q6H PRN Opyd, Ilene Qua, MD   1 mg at 06/19/17 0609  . magic mouthwash  15 mL Oral QID PRN Michael Boston, MD      . menthol-cetylpyridinium (CEPACOL) lozenge 3 mg  1 lozenge Oral PRN Adhikari, Amrit, MD      . mometasone-formoterol (DULERA) 200-5 MCG/ACT inhaler 2 puff  2 puff Inhalation BID Opyd, Ilene Qua, MD  2 puff at 06/20/17 0909  . multivitamin with minerals tablet 1 tablet  1 tablet Oral Daily Opyd, Ilene Qua, MD      . nicotine (NICODERM CQ - dosed in mg/24 hours) patch 21 mg  21 mg Transdermal Daily Opyd, Ilene Qua, MD    21 mg at 06/19/17 1601  . ondansetron (ZOFRAN) tablet 4 mg  4 mg Oral Q6H PRN Opyd, Ilene Qua, MD       Or  . ondansetron (ZOFRAN) injection 4 mg  4 mg Intravenous Q6H PRN Opyd, Ilene Qua, MD      . phenol (CHLORASEPTIC) mouth spray 1-2 spray  1-2 spray Mouth/Throat PRN Michael Boston, MD      . promethazine (PHENERGAN) injection 12.5 mg  12.5 mg Intravenous Q6H PRN Opyd, Ilene Qua, MD      . thiamine (VITAMIN B-1) tablet 100 mg  100 mg Oral Daily Opyd, Ilene Qua, MD       Or  . thiamine (B-1) injection 100 mg  100 mg Intravenous Daily Opyd, Ilene Qua, MD   100 mg at 06/19/17 1203     Allergies  Allergen Reactions  . Penicillins Itching, Rash and Other (See Comments)    PATIENT HAS HAD A PCN REACTION WITH IMMEDIATE RASH, FACIAL/TONGUE/THROAT SWELLING, SOB, OR LIGHTHEADEDNESS WITH HYPOTENSION:  #  #  #  YES  #  #  #   Has patient had a PCN reaction causing severe rash involving mucus membranes or skin necrosis: No Has patient had a PCN reaction that required hospitalization: No Has patient had a PCN reaction occurring within the last 10 years: No If all of the above answers are "NO", then may proceed with Cephalosporin use.     ROS:   All other systems reviewed & are negative except per HPI or as noted below: Constitutional:  No fevers, chills, sweats.  Weight stable Eyes:  No vision changes, No discharge HENT:  No sore throats, nasal drainage Lymph: No neck swelling, No bruising easily Pulmonary:  No cough, productive sputum CV: No orthopnea, PND  Patient walks 5 minutes with some difficulty.  No exertional chest/neck/shoulder/arm pain. GI:  No personal nor family history of GI/colon cancer, inflammatory bowel disease, irritable bowel syndrome, allergy such as Celiac Sprue, dietary/dairy problems, colitis, ulcers nor gastritis.  No recent sick contacts/gastroenteritis.  No travel outside the country.  No changes in diet. Renal: No UTIs, No hematuria Genital:  No drainage, bleeding,  masses Musculoskeletal: No severe joint pain.  Good ROM major joints Skin:  No sores or lesions.  No rashes Heme/Lymph:  No easy bleeding.  No swollen lymph nodes Neuro: No focal weakness/numbness.  No seizures Psych: No suicidal ideation.  No hallucinations  BP (!) 150/80 (BP Location: Right Arm)   Pulse 94   Temp 98.9 F (37.2 C) (Oral)   Resp (!) 22   Ht 5' 3"  (1.6 m)   Wt 104 lb 11.2 oz (47.5 kg)   SpO2 94%   BMI 18.55 kg/m   Physical Exam: General: Pt awake/alert/oriented x4 in no major acute distress Eyes: PERRL, normal EOM. Sclera nonicteric Neuro: CN II-XII intact w/o focal sensory/motor deficits. Lymph: No head/neck/groin lymphadenopathy Psych:  No delerium/psychosis/paranoia HENT: Normocephalic, Mucus membranes moist.  No thrush.  NGT in place.  Scant thin bilious output.  Flushes easily. Neck: Supple, No tracheal deviation Chest: No pain.  Good respiratory excursion. CV:  Pulses intact.  Regular rhythm Abdomen: Soft, Mildly distended.  Incision c/d/i.  Nontender.  No incarcerated hernias. Gen:  No inguinal hernias.  No inguinal lymphadenopathy.   Ext:  SCDs BLE.  No significant edema.  No cyanosis Skin: No petechiae / purpurea.  No major sores Musculoskeletal: No severe joint pain.  Good ROM major joints   Results:   Labs: Results for orders placed or performed during the hospital encounter of 06/19/17 (from the past 48 hour(s))  CBC with Differential/Platelet     Status: Abnormal   Collection Time: 06/19/17  3:54 AM  Result Value Ref Range   WBC 6.3 4.0 - 10.5 K/uL   RBC 3.77 (L) 4.22 - 5.81 MIL/uL   Hemoglobin 12.4 (L) 13.0 - 17.0 g/dL   HCT 37.0 (L) 39.0 - 52.0 %   MCV 98.1 78.0 - 100.0 fL   MCH 32.9 26.0 - 34.0 pg   MCHC 33.5 30.0 - 36.0 g/dL   RDW 14.3 11.5 - 15.5 %   Platelets 249 150 - 400 K/uL   Neutrophils Relative % 64 %   Neutro Abs 4.0 1.7 - 7.7 K/uL   Lymphocytes Relative 25 %   Lymphs Abs 1.6 0.7 - 4.0 K/uL   Monocytes Relative 11 %    Monocytes Absolute 0.7 0.1 - 1.0 K/uL   Eosinophils Relative 1 %   Eosinophils Absolute 0.0 0.0 - 0.7 K/uL   Basophils Relative 1 %   Basophils Absolute 0.0 0.0 - 0.1 K/uL    Comment: Performed at [facility], [address] [place], [place] [address]  Comprehensive metabolic panel     Status: Abnormal   Collection Time: 06/19/17  3:54 AM  Result Value Ref Range   Sodium 135 135 - 145 mmol/L   Potassium 3.6 3.5 - 5.1 mmol/L   Chloride 106 101 - 111 mmol/L   CO2 21 (L) 22 - 32 mmol/L   Glucose, Bld 90 65 - 99 mg/dL   BUN <5 (L) 6 - 20 mg/dL   Creatinine, Ser 0.79 0.61 - 1.24 mg/dL   Calcium 8.0 (L) 8.9 - 10.3 mg/dL   Total Protein 5.7 (L) 6.5 - 8.1 g/dL   Albumin 2.5 (L) 3.5 - 5.0 g/dL   AST 18 15 - 41 U/L   ALT 13 (L) 17 - 63 U/L   Alkaline Phosphatase 86 38 - 126 U/L   Total Bilirubin 0.4 0.3 - 1.2 mg/dL   GFR calc non Af Amer >60 >60 mL/min   GFR calc Af Amer >60 >60 mL/min    Comment: (NOTE) The eGFR has been calculated using the CKD EPI equation. This calculation has not been validated in all clinical situations. eGFR's persistently <60 mL/min signify possible Chronic Kidney Disease.    Anion gap 8 5 - 15    Comment: Performed at [facility] [address], [place], [place] [address]  Brain natriuretic peptide     Status: Abnormal   Collection Time: 06/19/17  3:54 AM  Result Value Ref Range   B Natriuretic Peptide 279.4 (H) 0.0 - 100.0 pg/mL    Comment: Performed at [facility] [address] [place], [place] [address]  Protime-INR     Status: None   Collection Time: 06/19/17  3:54 AM  Result Value Ref Range   Prothrombin Time 15.2 11.4 - 15.2 seconds   INR 1.21     Comment: Performed at [facility] [address], [place], [place] [address]    Imaging / Studies: Dg Abd 1 View  Result Date:  06/20/2017 CLINICAL DATA:  Small bowel obstruction. EXAM: ABDOMEN - 1 VIEW COMPARISON:  CT 06/18/2017 FINDINGS: Again noted are surgical skin  staples along the anterior abdomen and pelvis. Vascular stent in the native right common iliac artery. Extensive atherosclerotic calcifications involving the aorta and iliac arteries. Nasogastric tube tip in the left upper abdomen and probably in the gastric fundus or body region. Bowel gas throughout the abdomen without significant bowel dilatation. Again noted is gas within the rectum. Limited evaluation for free air on this supine image. IMPRESSION: Nasogastric tube in the proximal stomach. Nonobstructive bowel gas pattern. Electronically Signed   By: Markus Daft M.D.   On: 06/20/2017 09:25   Ct Angio Chest Pe W And/or Wo Contrast  Result Date: 06/11/2017 CLINICAL DATA:  Abdominal pain.  Recent femoral artery surgery. EXAM: CT ANGIOGRAPHY CHEST WITH CONTRAST TECHNIQUE: Multidetector CT imaging of the chest was performed using the standard protocol during bolus administration of intravenous contrast. Multiplanar CT image reconstructions and MIPs were obtained to evaluate the vascular anatomy. CONTRAST:  40m ISOVUE-370 IOPAMIDOL (ISOVUE-370) INJECTION 76%. Reduced dose was authorized, the patient had a prior contrast enhanced exam today. COMPARISON:  05/11/2017 CT chest FINDINGS: Cardiovascular: Coronary, aortic arch, and branch vessel atherosclerotic vascular disease. Mild cardiomegaly with small pericardial effusion. No filling defect is identified in the pulmonary arterial tree to suggest pulmonary embolus. Mediastinum/Nodes: Right infrahilar lymph node 0.6 cm in short axis on image 55/4. No overt pathologic adenopathy in the chest. Lungs/Pleura: Severe centrilobular emphysema. Mild airway thickening. Stable 3 by 5 mm subpleural lymph node along the right lower lobe side of the major fissure on image 57/5. Upper Abdomen: Perihepatic and perisplenic ascites. Musculoskeletal: There is a suggestion of low-grade diffuse subcutaneous edema. Sub xiphoid skin clips with a fluid collection in the subxiphoid region,  correlate with recent operative intervention. Lower cervical spondylosis. Review of the MIP images confirms the above findings. IMPRESSION: 1. No filling defect is identified in the pulmonary arterial tree to suggest pulmonary embolus. 2. Aortic Atherosclerosis (ICD10-I70.0) and Emphysema (ICD10-J43.9). Coronary atherosclerosis. 3. Mild cardiomegaly with small pericardial effusion. 4. Airway thickening is present, suggesting bronchitis or reactive airways disease. 5. Perihepatic and perisplenic ascites with low-grade diffuse subcutaneous edema, query third spacing of fluid. 6. Postoperative findings in the subxiphoid region. Electronically Signed   By: WVan ClinesM.D.   On: 06/11/2017 19:53   Ct Angio Ao+bifem W & Or Wo Contrast  Result Date: 06/11/2017 CLINICAL DATA:  62year old male with a reported history of recent bilateral femoral artery surgery. Evaluate for dissection or other complication. EXAM: CT ANGIOGRAPHY OF ABDOMINAL AORTA WITH ILIOFEMORAL RUNOFF TECHNIQUE: Multidetector CT imaging of the abdomen, pelvis and lower extremities was performed using the standard protocol during bolus administration of intravenous contrast. Multiplanar CT image reconstructions and MIPs were obtained to evaluate the vascular anatomy. CONTRAST:  1073mISOVUE-370 IOPAMIDOL (ISOVUE-370) INJECTION 76% COMPARISON:  Prior CTA runoff 04/29/2010 FINDINGS: VASCULAR Aorta: Heterogeneous atherosclerotic plaque throughout the abdominal aorta. Surgical changes of aorto bi femoral bypass graft. The aortic anastomosis is widely patent without evidence of complication. Celiac: Patent without evidence of aneurysm, dissection, vasculitis or significant stenosis. SMA: Patent without evidence of aneurysm, dissection, vasculitis or significant stenosis. Renals: Solitary renal arteries bilaterally. No evidence of significant stenosis, fibromuscular dysplasia, dissection or aneurysm. There is atherosclerotic plaque at the renal artery  origins bilaterally. IMA: Thrombosed secondary to aortic bypass. The distal branches reconstitute via collateral flow. RIGHT Lower Extremity Inflow: Thrombosed native iliac vessels. Widely patent  aortic to right femoral bypass graft. Outflow: The profunda femoral branches remain widely patent. However, the superficial femoral artery is occluded throughout the abductor canal. The vessel reconstitutes as the above the knee popliteal artery secondary to profunda femoral collaterals. The P2 and P3 segments of the popliteal artery are diseased with bulky atherosclerotic vascular calcifications. There are likely multifocal mild to moderate stenoses. Runoff: Calcified atherosclerotic plaque throughout the tibioperoneal trunk. There appears to be patent 3 vessel runoff to the ankle. LEFT Lower Extremity Inflow: Thrombosed native iliac vessels. Widely patent aortic to left common femoral bypass graft. Outflow: Common femoral artery is widely patent. The profunda femoral branches are also widely patent. However, the native superficial femoral artery is completely thrombosed but reconstitutes via collateral flow in the region of the abductor canal. The popliteal artery is diseased with multifocal areas of mild to moderate stenosis secondary to predominantly calcified atherosclerotic plaque. Runoff: Moderate focal stenosis at the origin of the anterior tibial artery. The tibioperoneal trunk is also diseased with a focal moderate to high-grade stenosis. However, there is patent 3 vessel runoff to the ankle. Veins: No focal venous abnormality identified. Review of the MIP images confirms the above findings. NON-VASCULAR Lower chest: Mild dependent atelectasis in the lower lobes. No pleural effusion. No suspicious pulmonary mass or nodule. Borderline left ventricular dilatation. Incompletely imaged right atrial dilatation. Significant reflux of injected contrast material into the super hepatic IVC and hepatic veins raising concern  for underlying right heart strain. Unremarkable distal thoracic esophagus. Hepatobiliary: Normal hepatic contour and morphology. No discrete hepatic lesions. Normal appearance of the gallbladder. No intra or extrahepatic biliary ductal dilatation. Pancreas: Unremarkable. No pancreatic ductal dilatation or surrounding inflammatory changes. Spleen: Normal in size without focal abnormality. Adrenals/Urinary Tract: Adrenal glands are unremarkable. Kidneys are normal, without renal calculi, focal lesion, or hydronephrosis. Bladder is unremarkable. Stomach/Bowel: No focal bowel wall thickening or evidence of obstruction. Lymphatic: No suspicious lymphadenopathy. Reproductive: Prostate is unremarkable. Other: Diffuse mesenteric edema. Small volume ascites. Small volume intermediate attenuation fluid in the region of the bilateral femoral artery cutdowns is not unexpected in the postoperative period. No evidence of unexpected fluid collection to suggest abscess or significant hematoma. Musculoskeletal: No acute fracture or aggressive appearing lytic or blastic osseous lesion. IMPRESSION: VASCULAR 1. Surgical changes of aortobifemoral bypass graft without evidence of complication. Expected postsurgical stranding and small fluid collections around the bilateral femoral arterial anastomoses. 2. 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 The profunda femoral arteries remain patent and reconstitute the outflow at the level of the P1 segment of the popliteal artery on the right, and in the distal SFA in the region of the abductor canal on the left. 3. Bilateral popliteal artery disease with multifocal mild and moderate stenoses secondary to predominantly calcified atherosclerotic plaque. 4. Calcified atherosclerotic plaque noted throughout the runoff territories bilaterally. However, there does appear to be patent 3 vessel runoff to the ankle bilaterally. NON-VASCULAR 1. Right atrial dilatation  with significant reflux of contrast material into the super hepatic IVC and hepatic veins. This suggests the possibility of underlying right heart strain/elevated right heart pressures. 2. Borderline left ventricular dilatation. Does the patient have a history of CHF? 3. Diffuse mild mesenteric edema and small volume ascites. This may be secondary to postoperative changes, third-spacing, or volume overload. 4. L5-S1 degenerative disc disease. Signed, Criselda Peaches, MD [facility] Electronically Signed   By: Jacqulynn Cadet M.D.   On: 06/11/2017 17:46  Dg Chest Portable 1 View  Result Date: 06/19/2017 CLINICAL DATA:  Shortness of breath tonight. EXAM: PORTABLE CHEST 1 VIEW COMPARISON:  Radiographs 06/03/2017, chest CT 06/11/2017 FINDINGS: Mild cardiomegaly is unchanged. Aortic atherosclerosis unchanged. No consolidation, pleural effusion or pneumothorax. Mild upper lobe emphysema. Surgical staples in the midline of the upper abdomen again seen. IMPRESSION: 1. No acute chest finding. 2. Mild cardiomegaly, unchanged. 3. Aortic Atherosclerosis (ICD10-I70.0) and Emphysema (ICD10-J43.9). Electronically Signed   By: Jeb Levering M.D.   On: 06/19/2017 03:37   Dg Chest Portable 1 View  Result Date: 06/03/2017 CLINICAL DATA:  Respiratory failure. EXAM: PORTABLE CHEST 1 VIEW COMPARISON:  06/02/2017 FINDINGS: Cardiomegaly, NG tube entering the stomach with tip off the field of view again noted. Swan-Ganz catheter has been removed with remaining RIGHT IJ central venous catheter sheath. Mild RIGHT basilar atelectasis has slightly increased. There is no evidence of pneumothorax. No other changes identified. IMPRESSION: Swan-Ganz catheter removal and slight increase in RIGHT basilar atelectasis. No other significant change. Electronically Signed   By: Margarette Canada M.D.   On: 06/03/2017 08:18   Dg Chest Port 1 View  Result Date: 06/02/2017 CLINICAL  DATA:  Post aortobifemoral bypass grafting, smoker, CHF, COPD, hypertension, coronary artery disease post MI EXAM: PORTABLE CHEST 1 VIEW COMPARISON:  Portable exam 0504 hours compared to 06/01/2017 FINDINGS: Tip of Swan-Ganz catheter projects over RIGHT pulmonary artery at pulmonary hilum. Nasogastric tube extends into stomach. Enlargement of cardiac silhouette with pulmonary vascular congestion. Atherosclerotic calcification aorta. Mild perihilar infiltrates likely minimal pulmonary edema. No pleural effusion or pneumothorax. IMPRESSION: Enlargement of cardiac silhouette with pulmonary vascular congestion and pulmonary edema, slightly increased. Electronically Signed   By: Lavonia Dana M.D.   On: 06/02/2017 09:45   Dg Chest Port 1 View  Result Date: 06/01/2017 CLINICAL DATA:  Aspiration EXAM: PORTABLE CHEST 1 VIEW COMPARISON:  06/01/2017, 05/11/2017 FINDINGS: Esophageal tube tip extends below the diaphragm. Right IJ Swan-Ganz catheter tip advanced, now projects over right inter lobar pulmonary artery. No focal opacity or pleural effusion. Stable slightly enlarged cardiomediastinal silhouette with mild central congestion. Mild diffuse interstitial opacities likely reflect minimal edema. Overall improved since prior study. No pneumothorax. IMPRESSION: 1. Right IJ Swan-Ganz catheter tip advanced and now projects over right inter lobar pulmonary artery 2. Cardiomegaly with vascular congestion and mild diffuse interstitial opacity suspect for minimal edema, overall decreased compared with prior radiograph. Electronically Signed   By: Donavan Foil M.D.   On: 06/01/2017 20:21   Dg Chest Port 1 View  Result Date: 06/01/2017 CLINICAL DATA:  Post aortobifem bypass graft EXAM: PORTABLE CHEST 1 VIEW COMPARISON:  CT angio chest abdomen pelvis of 05/11/2017 FINDINGS: Very prominent interstitial markings are noted throughout the lungs. Some of these markings may be chronic, but superimposed interstitial edema is a  consideration. No definite pleural effusion is noted. The heart is mildly enlarged. Swan-Ganz catheter tip is noted within the left main pulmonary artery. NG tube extends below the hemidiaphragm. IMPRESSION: 1. Very prominent interstitial markings some of which may be chronic, but superimposed interstitial edema cannot be excluded. No definite effusion. 2. Stable cardiomegaly with Swan-Ganz catheter tip within the left main pulmonary artery. Electronically Signed   By: Ivar Drape M.D.   On: 06/01/2017 13:51   Dg Abd Portable 1v  Result Date: 06/01/2017 CLINICAL DATA:  NG tube placement EXAM: PORTABLE ABDOMEN - 1 VIEW COMPARISON:  06/01/2017 FINDINGS: Cutaneous abdominal staples. Esophageal tube tip projects over the body of the stomach, side-port over  the proximal stomach. Visible lung bases are clear. IMPRESSION: Enteric tube tip projects over the mid to distal stomach. Electronically Signed   By: Donavan Foil M.D.   On: 06/01/2017 20:20   Dg Abd Portable 1v  Result Date: 06/01/2017 CLINICAL DATA:  Status post aortobifemoral bypass graft. EXAM: PORTABLE ABDOMEN - 1 VIEW COMPARISON:  04/28/2017 CT abdomen/pelvis FINDINGS: Enteric tube terminates in the proximal stomach. Vertical skin staples are noted just to the left of the lumbar spine. Extensive aorto bi-iliac atherosclerotic calcifications are noted with right common iliac stent graft. No disproportionately dilated small bowel loops. No evidence of pneumatosis or pneumoperitoneum. Mild colonic stool. No radiopaque nephrolithiasis. IMPRESSION: 1. Enteric tube terminates in the proximal stomach. 2. Nonobstructive bowel gas pattern. Electronically Signed   By: Ilona Sorrel M.D.   On: 06/01/2017 13:51    Medications / Allergies: per chart  Antibiotics: Anti-infectives (From admission, onward)   None        Note: Portions of this report may have been transcribed using voice recognition software. Every effort was made to ensure accuracy;  however, inadvertent computerized transcription errors may be present.   Any transcriptional errors that result from this process are unintentional.    Adin Hector, M.D., F.A.C.S. Gastrointestinal and Minimally Invasive Surgery [facility] [address] [place], [place] [address] [phone] Main / Paging   06/20/2017

## 2017-06-20 NOTE — Progress Notes (Signed)
Upon patient assessment this AM, patient complains only of sore throat and nose pain from NG - 10/10 pain.  Absent bowel sounds noted to RUQ/RLQ.  Dr. Renford Dills aware.   New orders received for another xray of abdomen with Gastrografin

## 2017-06-20 NOTE — Progress Notes (Signed)
  Progress Note    06/20/2017 10:33 AM * No surgery found *  Subjective: States that he is feeling much better without abdominal pain and now passing flatus  Vitals:   06/20/17 0733 06/20/17 0909  BP: 134/84   Pulse: 93 94  Resp: 15 (!) 21  Temp: 99.1 F (37.3 C)   SpO2: 100% 100%    Physical Exam: Abdomen is softer today.  Midline incision clean dry and intact as her bilateral groin incisions. Both feet are warm and neuro intact.  CBC    Component Value Date/Time   WBC 6.3 06/19/2017 0354   RBC 3.77 (L) 06/19/2017 0354   HGB 12.4 (L) 06/19/2017 0354   HCT 37.0 (L) 06/19/2017 0354   PLT 249 06/19/2017 0354   MCV 98.1 06/19/2017 0354   MCH 32.9 06/19/2017 0354   MCHC 33.5 06/19/2017 0354   RDW 14.3 06/19/2017 0354   LYMPHSABS 1.6 06/19/2017 0354   MONOABS 0.7 06/19/2017 0354   EOSABS 0.0 06/19/2017 0354   BASOSABS 0.0 06/19/2017 0354    BMET    Component Value Date/Time   NA 135 06/19/2017 0354   K 3.6 06/19/2017 0354   CL 106 06/19/2017 0354   CO2 21 (L) 06/19/2017 0354   GLUCOSE 90 06/19/2017 0354   BUN <5 (L) 06/19/2017 0354   CREATININE 0.79 06/19/2017 0354   CALCIUM 8.0 (L) 06/19/2017 0354   GFRNONAA >60 06/19/2017 0354   GFRAA >60 06/19/2017 0354    INR    Component Value Date/Time   INR 1.21 06/19/2017 0354     Intake/Output Summary (Last 24 hours) at 06/20/2017 1033 Last data filed at 06/20/2017 0927 Gross per 24 hour  Intake 930 ml  Output 825 ml  Net 105 ml     Assessment:  62 y.o. male is s/p aortobifemoral bypass grafting now admitted with partial small bowel obstruction.  NG tube in place and abdomen is soft now passing flatus.  Plan: Continue conservative management of small bowel obstruction Does not appear to have mesenteric ischemia   Zeus Marquis C. Randie Heinz, MD [facility] [place] Office: [phone] Pager: [phone]  06/20/2017 10:33 AM

## 2017-06-21 DIAGNOSIS — K567 Ileus, unspecified: Secondary | ICD-10-CM

## 2017-06-21 LAB — CBC WITH DIFFERENTIAL/PLATELET
BASOS ABS: 0 10*3/uL (ref 0.0–0.1)
Basophils Relative: 1 %
Eosinophils Absolute: 0 10*3/uL (ref 0.0–0.7)
Eosinophils Relative: 1 %
HEMATOCRIT: 35.8 % — AB (ref 39.0–52.0)
HEMOGLOBIN: 11.9 g/dL — AB (ref 13.0–17.0)
LYMPHS ABS: 1.1 10*3/uL (ref 0.7–4.0)
LYMPHS PCT: 26 %
MCH: 32.9 pg (ref 26.0–34.0)
MCHC: 33.2 g/dL (ref 30.0–36.0)
MCV: 98.9 fL (ref 78.0–100.0)
Monocytes Absolute: 0.5 10*3/uL (ref 0.1–1.0)
Monocytes Relative: 13 %
NEUTROS ABS: 2.4 10*3/uL (ref 1.7–7.7)
Neutrophils Relative %: 59 %
Platelets: 184 10*3/uL (ref 150–400)
RBC: 3.62 MIL/uL — AB (ref 4.22–5.81)
RDW: 14.2 % (ref 11.5–15.5)
WBC: 4.1 10*3/uL (ref 4.0–10.5)

## 2017-06-21 LAB — BASIC METABOLIC PANEL
ANION GAP: 7 (ref 5–15)
CHLORIDE: 104 mmol/L (ref 101–111)
CO2: 23 mmol/L (ref 22–32)
Calcium: 8.2 mg/dL — ABNORMAL LOW (ref 8.9–10.3)
Creatinine, Ser: 0.65 mg/dL (ref 0.61–1.24)
GFR calc Af Amer: >60 mL/min (ref >60)
GFR calc non Af Amer: >60 mL/min (ref >60)
GLUCOSE: 99 mg/dL (ref 65–99)
POTASSIUM: 3.3 mmol/L — AB (ref 3.5–5.1)
Sodium: 134 mmol/L — ABNORMAL LOW (ref 135–145)

## 2017-06-21 MED ORDER — FOLIC ACID 1 MG PO TABS: 1.0000 mg | ORAL_TABLET | Freq: Every day | ORAL | 0 refills | Status: DC

## 2017-06-21 MED ORDER — POTASSIUM CHLORIDE ER 20 MEQ PO TBCR: 20.0000 meq | EXTENDED_RELEASE_TABLET | Freq: Every day | ORAL | 0 refills | Status: DC

## 2017-06-21 MED ORDER — THIAMINE HCL 100 MG PO TABS: 100.0000 mg | ORAL_TABLET | Freq: Every day | ORAL | 0 refills | Status: DC

## 2017-06-21 MED ORDER — POTASSIUM CHLORIDE 10 MEQ/100ML IV SOLN
10.0000 meq | INTRAVENOUS | Status: AC
  Administered 2017-06-21 (×4): 10 meq via INTRAVENOUS
  Filled 2017-06-21 (×3): qty 100

## 2017-06-21 NOTE — Care Management Note (Addendum)
Case Management Note  Patient Details  Name: Wayne [name] MRN: [id number] Date of Birth: [birth year]  Subjective/Objective:    Pt admitted with ileus                Action/Plan:  PTA independent from home with family.  Pt was set up with [facility] early April - however pt declined services once initiation was attempted by agency.   Pt is also recommended for home oxygen - pt refused states he will discuss the recommendation with his pulmonologist - bedside nurse informed attending.   Pt will discharge home via private vehicle.      Expected Discharge Date:  06/21/17               Expected Discharge Plan:  Home w Home Health Services  In-House Referral:     Discharge planning Services  CM Consult  Post Acute Care Choice:  Durable Medical Equipment Choice offered to:  Patient  DME Arranged:  (refused oxygen) DME Agency:     HH Arranged:  Patient Refused HH HH Agency:     Status of Service:  Completed, signed off  If discussed at Long Length of Stay Meetings, dates discussed:    Additional Comments:  Cherylann Parr, RN 06/21/2017, 3:53 PM

## 2017-06-21 NOTE — Progress Notes (Addendum)
  Progress Note    06/21/2017 7:49 AM * No surgery found *  Subjective:  Feels good; wants to eat; wants to go home  Tm 101.9 at 1500 now afebrile   Vitals:   06/21/17 0348 06/21/17 0710  BP: 135/78 (!) 149/86  Pulse: 90 91  Resp: (!) 21 19  Temp: 98.8 F (37.1 C) 98.8 F (37.1 C)  SpO2: 94% 94%    Physical Exam: General:  No distress Lungs:  Non labored Incisions:  Midline incision is clean with staples in tact; bilateral groin incisions are clean and dry Extremities:  Bilateral feet are warm and well perfused. Abdomen:  Soft, NT/ND; +BS; +flatus; +BM  CBC    Component Value Date/Time   WBC 4.1 06/21/2017 0305   RBC 3.62 (L) 06/21/2017 0305   HGB 11.9 (L) 06/21/2017 0305   HCT 35.8 (L) 06/21/2017 0305   PLT 184 06/21/2017 0305   MCV 98.9 06/21/2017 0305   MCH 32.9 06/21/2017 0305   MCHC 33.2 06/21/2017 0305   RDW 14.2 06/21/2017 0305   LYMPHSABS 1.1 06/21/2017 0305   MONOABS 0.5 06/21/2017 0305   EOSABS 0.0 06/21/2017 0305   BASOSABS 0.0 06/21/2017 0305    BMET    Component Value Date/Time   NA 134 (L) 06/21/2017 0305   K 3.3 (L) 06/21/2017 0305   CL 104 06/21/2017 0305   CO2 23 06/21/2017 0305   GLUCOSE 99 06/21/2017 0305   BUN <5 (L) 06/21/2017 0305   CREATININE 0.65 06/21/2017 0305   CALCIUM 8.2 (L) 06/21/2017 0305   GFRNONAA >60 06/21/2017 0305   GFRAA >60 06/21/2017 0305    INR    Component Value Date/Time   INR 1.21 06/19/2017 0354     Intake/Output Summary (Last 24 hours) at 06/21/2017 0749 Last data filed at 06/21/2017 0746 Gross per 24 hour  Intake 340 ml  Output 600 ml  Net -260 ml     Assessment:  62 y.o. male is s/p:  Aortobifemoral bypass grafting and right femoral endarterectomy  Plan: -pt feels good this morning and hungry.  He is passing flatus and +BM -advance diet per general surgery -bilateral feet are warm and well perfused.   -pt with fever of 101.9 yesterday now afebrile-incisions are all healing nicely without  evidence of infection.      Doreatha Massed, PA-C [facility] [phone] 06/21/2017 7:49 AM   Addendum  I have independently interviewed and examined the patient, and I agree with the physician assistant's findings.  Per pt baseline has chronic intermittent abdominal pain even before surgery.  Most of his pain is resolved at this point.  Reports flatus and bowel movement.  Patient's incision are c/d/i.  Unclear etiology of fever in this patient.  WBC 4.1.  I reviewed his CTA, I don't see any evidence of ischemic bowel.  There is no evidence of ABF infection.  This patient has a cirrhotic liver some one ascites is not unexpected.  He also has known severe pulmonary HTN.     Advance diet  Bowel regimen  Staples can come out  Leonides Sake, MD, [facility] [place] Office: [phone] Pager: [phone]  06/21/2017, 9:24 AM

## 2017-06-21 NOTE — Progress Notes (Signed)
Refused staples to be removed from his abd., claimed that vascular surgeon will remove on wed. MD aware.

## 2017-06-21 NOTE — Progress Notes (Signed)
Subjective/Chief Complaint: No complaints denies nausea or vomiting   Having BM    Objective: Vital signs in last 24 hours: Temp:  [98.8 F (37.1 C)-101.9 F (38.8 C)] 98.8 F (37.1 C) (04/15 0710) Pulse Rate:  [83-103] 91 (04/15 0710) Resp:  [16-22] 19 (04/15 0710) BP: (135-157)/(71-94) 149/86 (04/15 0710) SpO2:  [93 %-96 %] 93 % (04/15 0755) Last BM Date: 06/20/17  Intake/Output from previous day: 04/14 0701 - 04/15 0700 In: 240 [P.O.:240] Out: 600 [Urine:600] Intake/Output this shift: Total I/O In: 200 [IV Piggyback:200] Out: 300 [Urine:300]  General appearance: alert and cooperative GI: soft, non-tender; bowel sounds normal; no masses,  no organomegaly Neurologic: Grossly normal INCISION CDI  Lab Results:  Recent Labs    06/19/17 0354 06/21/17 0305  WBC 6.3 4.1  HGB 12.4* 11.9*  HCT 37.0* 35.8*  PLT 249 184   BMET Recent Labs    06/19/17 0354 06/21/17 0305  NA 135 134*  K 3.6 3.3*  CL 106 104  CO2 21* 23  GLUCOSE 90 99  BUN <5* <5*  CREATININE 0.79 0.65  CALCIUM 8.0* 8.2*   PT/INR Recent Labs    06/19/17 0354  LABPROT 15.2  INR 1.21   ABG No results for input(s): PHART, HCO3 in the last 72 hours.  Invalid input(s): PCO2, PO2  Studies/Results: Dg Abd 1 View  Result Date: 06/20/2017 CLINICAL DATA:  Small bowel obstruction. EXAM: ABDOMEN - 1 VIEW COMPARISON:  CT 06/18/2017 FINDINGS: Again noted are surgical skin staples along the anterior abdomen and pelvis. Vascular stent in the native right common iliac artery. Extensive atherosclerotic calcifications involving the aorta and iliac arteries. Nasogastric tube tip in the left upper abdomen and probably in the gastric fundus or body region. Bowel gas throughout the abdomen without significant bowel dilatation. Again noted is gas within the rectum. Limited evaluation for free air on this supine image. IMPRESSION: Nasogastric tube in the proximal stomach. Nonobstructive bowel gas pattern.  Electronically Signed   By: Richarda Overlie M.D.   On: 06/20/2017 09:25   Dg Chest Port 1 View  Result Date: 06/20/2017 CLINICAL DATA:  Fever and shortness of breath EXAM: PORTABLE CHEST 1 VIEW COMPARISON:  June 19, 2017 chest radiograph and chest CT June 11, 2017 FINDINGS: Nasogastric tube tip is in the mid esophagus. Underlying emphysematous change is stable. There is generalized interstitial thickening, likely due to chronic inflammatory type change. No frank edema or consolidation evident. Heart is mildly enlarged with pulmonary vascular within normal limits. There is aortic atherosclerosis. No adenopathy. No bone lesions. IMPRESSION: 1.  Nasogastric tube tip in midesophagus. 2. Underlying emphysema with generalized interstitial thickening, likely reflecting chronic inflammatory type change. No frank edema or consolidation. 3.  Stable mild cardiac enlargement. 4.  There is aortic atherosclerosis. Aortic Atherosclerosis (ICD10-I70.0) and Emphysema (ICD10-J43.9). These results will be called to the ordering clinician or representative by the Radiologist Assistant, and communication documented in the PACS or zVision Dashboard. Electronically Signed   By: Bretta Bang III M.D.   On: 06/20/2017 17:17   Dg Abd Portable 1v-small Bowel Obstruction Protocol-initial, 8 Hr Delay  Result Date: 06/20/2017 CLINICAL DATA:  Small bowel obstruction EXAM: PORTABLE ABDOMEN - 1 VIEW COMPARISON:  Study obtained earlier in the day FINDINGS: Nasogastric tube tip and side port are in the body of the stomach. There is currently no appreciable bowel dilatation, and there is no appreciable air-fluid level. There is contrast in the colon and rectum. There are skin staples in the  midline. No free air. There is aortoiliac atherosclerosis with a stent in the right common iliac artery. There is also femoral artery atherosclerosis. Lung bases are clear. IMPRESSION: Nasogastric tube tip and side port in stomach. No appreciable bowel  dilatation or air-fluid level to indicate obstruction. No free air. Extensive vascular calcification including aortic atherosclerosis. Aortic Atherosclerosis (ICD10-I70.0). Electronically Signed   By: Bretta Bang III M.D.   On: 06/20/2017 19:11    Anti-infectives: Anti-infectives (From admission, onward)   None      Assessment/Plan: Patient Active Problem List   Diagnosis Date Noted  . Hepatic cirrhosis (HCC) 06/20/2017  . Ileus  06/19/2017  . Abdominal pain, acute, left lower quadrant 06/12/2017  . Respiratory failure with hypoxia (HCC) 06/12/2017  . Acute left lower quadrant pain 06/12/2017  . Constipation 06/12/2017  . PAH (pulmonary artery hypertension) (HCC)   . 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  . Malnutrition of moderate degree 05/12/2017  . COPD with acute exacerbation (HCC) 05/11/2017  . Fever 05/11/2017  . ETOH abuse 05/11/2017  . Anemia   . COPD (chronic obstructive pulmonary disease) (HCC)   . Hypertension   . Critical lower limb ischemia 04/01/2017  . Peripheral vascular disease (HCC) 03/22/2017  . Simple chronic bronchitis (HCC) 03/22/2017  . 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 Degenerative disc disease, lumbar 12/11/2016  . Primary osteoarthritis of right hip 12/11/2016  . Hypokalemia 01/19/2016  . Chronic systolic heart failure (HCC) 01/18/2016  . Acute respiratory failure with hypoxia (HCC) 01/18/2016  . Bullous lesion 01/18/2016  . Drinks beer 01/18/2016  . Hyperlipidemia 01/18/2016  . Hyponatremia 01/18/2016  . Tobacco abuse 01/18/2016   RESOLVING ILEUS  ADVANCE DIET AS TOLERATED  WILL SIGN OFF     LOS: 2 days    Wayne [name] A Wayne [name] 06/21/2017

## 2017-06-21 NOTE — Progress Notes (Signed)
Dicharged home , stable, accompanied by sister. Discharge instructions and prescription given to pt. Belongings taken home.

## 2017-06-21 NOTE — Progress Notes (Signed)
Pt desats on 80s' on room air, refused to have o2 at home claiming that his pulmonologist claimed not to. Explanation given but insisted not to. MD aware.

## 2017-06-21 NOTE — Progress Notes (Signed)
SATURATION QUALIFICATIONS: (This note is used to comply with regulatory documentation for home oxygen)  Patient Saturations on Room Air at Rest = 94%  Patient Saturations on Room Air while Ambulating =84%  Patient Saturations on 2 Liters of oxygen while Ambulating = 95%  Please briefly explain why patient needs home oxygen: desating while ambulating.

## 2017-06-21 NOTE — Discharge Summary (Signed)
Physician Discharge Summary  Wayne [name]:[id number] DOB: May 04, 1955 DOA: 06/19/2017  PCP: Elinor Dodge, MD  Admit date: 06/19/2017 Discharge date: 06/21/2017  Admitted From: Home Disposition:  Home  Home Health:Yes Equipment/Devices:Oxygen  Discharge Condition: Stable CODE STATUS: Full Diet recommendation: Soft diet    Brief/Interim Summary: Patient is a 62 year old male with past medical history of COPD, chronic systolic CHF, alcohol dependence, peripheral artery disease status post recent aortobifemoral bypass who presented to the emergency department for the evaluation of abdominal pain and nausea.  Reported that he developed abdominal pain about 4 days ago.  He has been having abdominal pain on and off since the  the surgery.  CT of the abdomen and pelvis done on presentation showed patent aorta bifemoral bypass grafts, 3 cm hyper density anterior to the left common femoral artery and diffuse fluid-filled distention of small bowel with dilation of small bowel with transition point not  identified likely reflecting ileus or early/partial SBO. Patient was admitted for the management of partial small bowel obstruction.  Patient started on conservative management with NG tube placement and n.p.o. Status.  Vascular surgery was also following. General surgery also consulted. Patient's partial small bowel obstruction resolved today.  He started having bowel movements.  He tolerated soft diet. Patient unfortunately desaturated on room air and qualified for home oxygen.  Home oxygen will be arranged.  Following problems were addressed during his hospitalization:    Partial small bowel obstruction : Resolved with conservative management.      Small bowel obstruction has resolved.  Patient having bowel movements.  Denies any abdominal pain.  Tolerated soft diet.  Status post aorto bifemoral bypass: Vascular surgery following.  Aortobifemoral bypass was done on 06/01/17. CTA  abd/pelvis in ED with patent grafts, 3 cm hypodensity near left CFA suggestive of fluid collection or thrombosed pseudoaneurysm. Resume his home meds. He will follow-up with vascular surgery as an outpatient.  Patient did not want his staples to be removed today and says he will remove during appointment with  vascular surgery .  Chronic systolic CHF: Appeared hypovolemic on presentation.  Resume home lasix.  History of alcohol abuse: No signs of withdrawal on admission. Counseled for alcohol cessation.  Continue thiamine and folic acid  History of COPD: Desaturated on room air.  Qualified for home oxygen.  Will arrange home oxygen. Continue bronchodilators.  He needs to follow-up with his pulmonologist Dr. Chancy Milroy as an outpatient. Chest x-ray did not show any pneumonia.    Discharge Diagnoses:  Principal Problem:   Ileus  Active Problems:   Chronic systolic heart failure (HCC)   Peripheral vascular disease (HCC)   COPD (chronic obstructive pulmonary disease) (HCC)   Hypertension   ETOH abuse   Malnutrition of moderate degree   Hepatic cirrhosis (HCC)    Discharge Instructions  Discharge Instructions    Diet - low sodium heart healthy   Complete by:  As directed    Discharge instructions   Complete by:  As directed    1) Please follow up with your PCP in a week. 2) Follow up with your pulmonologist in 2 weeks. 3) Do a CBC and BMP test in a week. 4) Please stop smoking and alcohol consumption.   Increase activity slowly   Complete by:  As directed      Allergies as of 06/21/2017      Reactions   Penicillins Itching, Rash, Other (See Comments)   PATIENT HAS HAD A PCN REACTION WITH IMMEDIATE RASH,  FACIAL/TONGUE/THROAT SWELLING, SOB, OR LIGHTHEADEDNESS WITH HYPOTENSION:  #  #  #  YES  #  #  #   Has patient had a PCN reaction causing severe rash involving mucus membranes or skin necrosis: No Has patient had a PCN reaction that required hospitalization: No Has patient  had a PCN reaction occurring within the last 10 years: No If all of the above answers are "NO", then may proceed with Cephalosporin use.      Medication List    TAKE these medications   acetaminophen 500 MG tablet Commonly known as:  TYLENOL Take 2 tablets (1,000 mg total) by mouth every 6 (six) hours as needed. What changed:    how much to take  when to take this  reasons to take this   albuterol 108 (90 Base) MCG/ACT inhaler Commonly known as:  PROVENTIL HFA;VENTOLIN HFA Inhale 2 puffs into the lungs every 6 (six) hours as needed for wheezing or shortness of breath.   aspirin EC 81 MG tablet Take 81 mg by mouth daily.   atorvastatin 20 MG tablet Commonly known as:  LIPITOR Take 20 mg by mouth daily.   budesonide-formoterol 160-4.5 MCG/ACT inhaler Commonly known as:  SYMBICORT Inhale 2 puffs into the lungs 2 (two) times daily.   folic acid 1 MG tablet Commonly known as:  FOLVITE Take 1 tablet (1 mg total) by mouth daily. Start taking on:  06/22/2017   furosemide 20 MG tablet Commonly known as:  LASIX Take 1 tablet (20 mg total) by mouth daily.   multivitamin with minerals Tabs tablet Take 1 tablet by mouth daily.   nicotine 21 mg/24hr patch Commonly known as:  NICODERM CQ - dosed in mg/24 hours Place 1 patch (21 mg total) onto the skin daily.   omeprazole 20 MG capsule Commonly known as:  PRILOSEC Take 1 capsule (20 mg total) by mouth daily as needed (for indegestion).   ondansetron 4 MG disintegrating tablet Commonly known as:  ZOFRAN ODT Take 1 tablet (4 mg total) by mouth every 8 (eight) hours as needed for nausea or vomiting. What changed:  reasons to take this   polyethylene glycol packet Commonly known as:  MIRALAX Take 17 g by mouth daily.   Potassium Chloride ER 20 MEQ Tbcr Take 20 mEq by mouth daily. Start taking on:  06/22/2017   senna-docusate 8.6-50 MG tablet Commonly known as:  SENOKOT S Take 1 tablet by mouth 2 (two) times daily.    sildenafil 20 MG tablet Commonly known as:  REVATIO Take 1 tablet (20 mg total) by mouth 3 (three) times daily.   thiamine 100 MG tablet Take 1 tablet (100 mg total) by mouth daily.   traMADol 50 MG tablet Commonly known as:  ULTRAM Take 1 tablet (50 mg total) by mouth every 6 (six) hours as needed for up to 10 doses.            Durable Medical Equipment  (From admission, onward)        Start     Ordered   06/21/17 1516  For home use only DME oxygen  Once    Question Answer Comment  Mode or (Route) Nasal cannula   Liters per Minute 3   Oxygen delivery system Gas      06/21/17 1516     Follow-up Information    Elinor Dodge, MD. Schedule an appointment as soon as possible for a visit in 1 week(s).   Specialty:  Internal Medicine Contact information:  [address] [place] [address] [phone]          Allergies  Allergen Reactions  . Penicillins Itching, Rash and Other (See Comments)    PATIENT HAS HAD A PCN REACTION WITH IMMEDIATE RASH, FACIAL/TONGUE/THROAT SWELLING, SOB, OR LIGHTHEADEDNESS WITH HYPOTENSION:  #  #  #  YES  #  #  #   Has patient had a PCN reaction causing severe rash involving mucus membranes or skin necrosis: No Has patient had a PCN reaction that required hospitalization: No Has patient had a PCN reaction occurring within the last 10 years: No If all of the above answers are "NO", then may proceed with Cephalosporin use.     Consultations: Surgery, vascular surgery  Procedures/Studies: Dg Abd 1 View  Result Date: 06/20/2017 CLINICAL DATA:  Small bowel obstruction. EXAM: ABDOMEN - 1 VIEW COMPARISON:  CT 06/18/2017 FINDINGS: Again noted are surgical skin staples along the anterior abdomen and pelvis. Vascular stent in the native right common iliac artery. Extensive atherosclerotic calcifications involving the aorta and iliac arteries. Nasogastric tube tip in the left upper abdomen and probably in the gastric fundus or  body region. Bowel gas throughout the abdomen without significant bowel dilatation. Again noted is gas within the rectum. Limited evaluation for free air on this supine image. IMPRESSION: Nasogastric tube in the proximal stomach. Nonobstructive bowel gas pattern. Electronically Signed   By: Richarda Overlie M.D.   On: 06/20/2017 09:25   Ct Angio Chest Pe W And/or Wo Contrast  Result Date: 06/11/2017 CLINICAL DATA:  Abdominal pain.  Recent femoral artery surgery. EXAM: CT ANGIOGRAPHY CHEST WITH CONTRAST TECHNIQUE: Multidetector CT imaging of the chest was performed using the standard protocol during bolus administration of intravenous contrast. Multiplanar CT image reconstructions and MIPs were obtained to evaluate the vascular anatomy. CONTRAST:  75mL ISOVUE-370 IOPAMIDOL (ISOVUE-370) INJECTION 76%. Reduced dose was authorized, the patient had a prior contrast enhanced exam today. COMPARISON:  05/11/2017 CT chest FINDINGS: Cardiovascular: Coronary, aortic arch, and branch vessel atherosclerotic vascular disease. Mild cardiomegaly with small pericardial effusion. No filling defect is identified in the pulmonary arterial tree to suggest pulmonary embolus. Mediastinum/Nodes: Right infrahilar lymph node 0.6 cm in short axis on image 55/4. No overt pathologic adenopathy in the chest. Lungs/Pleura: Severe centrilobular emphysema. Mild airway thickening. Stable 3 by 5 mm subpleural lymph node along the right lower lobe side of the major fissure on image 57/5. Upper Abdomen: Perihepatic and perisplenic ascites. Musculoskeletal: There is a suggestion of low-grade diffuse subcutaneous edema. Sub xiphoid skin clips with a fluid collection in the subxiphoid region, correlate with recent operative intervention. Lower cervical spondylosis. Review of the MIP images confirms the above findings. IMPRESSION: 1. No filling defect is identified in the pulmonary arterial tree to suggest pulmonary embolus. 2. Aortic Atherosclerosis  (ICD10-I70.0) and Emphysema (ICD10-J43.9). Coronary atherosclerosis. 3. Mild cardiomegaly with small pericardial effusion. 4. Airway thickening is present, suggesting bronchitis or reactive airways disease. 5. Perihepatic and perisplenic ascites with low-grade diffuse subcutaneous edema, query third spacing of fluid. 6. Postoperative findings in the subxiphoid region. Electronically Signed   By: Gaylyn Rong M.D.   On: 06/11/2017 19:53   Ct Angio Ao+bifem W & Or Wo Contrast  Result Date: 06/11/2017 CLINICAL DATA:  62 year old male with a reported history of recent bilateral femoral artery surgery. Evaluate for dissection or other complication. EXAM: CT ANGIOGRAPHY OF ABDOMINAL AORTA WITH ILIOFEMORAL RUNOFF TECHNIQUE: Multidetector CT imaging of the abdomen, pelvis and lower extremities was performed using  the standard protocol during bolus administration of intravenous contrast. Multiplanar CT image reconstructions and MIPs were obtained to evaluate the vascular anatomy. CONTRAST:  ISOVUE-370 IOPAMIDOL (ISOVUE-370) INJECTION 76% COMPARISON:  Prior CTA runoff 04/29/2010 FINDINGS: VASCULAR Aorta: Heterogeneous atherosclerotic plaque throughout the abdominal aorta. Surgical changes of aorto bi femoral bypass graft. The aortic anastomosis is widely patent without evidence of complication. Celiac: Patent without evidence of aneurysm, dissection, vasculitis or significant stenosis. SMA: Patent without evidence of aneurysm, dissection, vasculitis or significant stenosis. Renals: Solitary renal arteries bilaterally. No evidence of significant stenosis, fibromuscular dysplasia, dissection or aneurysm. There is atherosclerotic plaque at the renal artery origins bilaterally. IMA: Thrombosed secondary to aortic bypass. The distal branches reconstitute via collateral flow. RIGHT Lower Extremity Inflow: Thrombosed native iliac vessels. Widely patent aortic to right femoral bypass graft. Outflow: The profunda  femoral branches remain widely patent. However, the superficial femoral artery is occluded throughout the abductor canal. The vessel reconstitutes as the above the knee popliteal artery secondary to profunda femoral collaterals. The P2 and P3 segments of the popliteal artery are diseased with bulky atherosclerotic vascular calcifications. There are likely multifocal mild to moderate stenoses. Runoff: Calcified atherosclerotic plaque throughout the tibioperoneal trunk. There appears to be patent 3 vessel runoff to the ankle. LEFT Lower Extremity Inflow: Thrombosed native iliac vessels. Widely patent aortic to left common femoral bypass graft. Outflow: Common femoral artery is widely patent. The profunda femoral branches are also widely patent. However, the native superficial femoral artery is completely thrombosed but reconstitutes via collateral flow in the region of the abductor canal. The popliteal artery is diseased with multifocal areas of mild to moderate stenosis secondary to predominantly calcified atherosclerotic plaque. Runoff: Moderate focal stenosis at the origin of the anterior tibial artery. The tibioperoneal trunk is also diseased with a focal moderate to high-grade stenosis. However, there is patent 3 vessel runoff to the ankle. Veins: No focal venous abnormality identified. Review of the MIP images confirms the above findings. NON-VASCULAR Lower chest: Mild dependent atelectasis in the lower lobes. No pleural effusion. No suspicious pulmonary mass or nodule. Borderline left ventricular dilatation. Incompletely imaged right atrial dilatation. Significant reflux of injected contrast material into the super hepatic IVC and hepatic veins raising concern for underlying right heart strain. Unremarkable distal thoracic esophagus. Hepatobiliary: Normal hepatic contour and morphology. No discrete hepatic lesions. Normal appearance of the gallbladder. No intra or extrahepatic biliary ductal dilatation.  Pancreas: Unremarkable. No pancreatic ductal dilatation or surrounding inflammatory changes. Spleen: Normal in size without focal abnormality. Adrenals/Urinary Tract: Adrenal glands are unremarkable. Kidneys are normal, without renal calculi, focal lesion, or hydronephrosis. Bladder is unremarkable. Stomach/Bowel: No focal bowel wall thickening or evidence of obstruction. Lymphatic: No suspicious lymphadenopathy. Reproductive: Prostate is unremarkable. Other: Diffuse mesenteric edema. Small volume ascites. Small volume intermediate attenuation fluid in the region of the bilateral femoral artery cutdowns is not unexpected in the postoperative period. No evidence of unexpected fluid collection to suggest abscess or significant hematoma. Musculoskeletal: No acute fracture or aggressive appearing lytic or blastic osseous lesion. IMPRESSION: VASCULAR 1. Surgical changes of aortobifemoral bypass graft without evidence of complication. Expected postsurgical stranding and small fluid collections around the bilateral femoral arterial anastomoses. 2. 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 The profunda femoral arteries remain patent and reconstitute the outflow at the level of the P1 segment of the popliteal artery on the right, and in the distal SFA in the region of the abductor canal on the left. 3. Bilateral  popliteal artery disease with multifocal mild and moderate stenoses secondary to predominantly calcified atherosclerotic plaque. 4. Calcified atherosclerotic plaque noted throughout the runoff territories bilaterally. However, there does appear to be patent 3 vessel runoff to the ankle bilaterally. NON-VASCULAR 1. Right atrial dilatation with significant reflux of contrast material into the super hepatic IVC and hepatic veins. This suggests the possibility of underlying right heart strain/elevated right heart pressures. 2. Borderline left ventricular dilatation. Does the patient  have a history of CHF? 3. Diffuse mild mesenteric edema and small volume ascites. This may be secondary to postoperative changes, third-spacing, or volume overload. 4. L5-S1 degenerative disc disease. Signed, Sterling Big, MD [facility] Electronically Signed   By: Malachy Moan M.D.   On: 06/11/2017 17:46   Dg Chest Port 1 View  Result Date: 06/20/2017 CLINICAL DATA:  Fever and shortness of breath EXAM: PORTABLE CHEST 1 VIEW COMPARISON:  June 19, 2017 chest radiograph and chest CT June 11, 2017 FINDINGS: Nasogastric tube tip is in the mid esophagus. Underlying emphysematous change is stable. There is generalized interstitial thickening, likely due to chronic inflammatory type change. No frank edema or consolidation evident. Heart is mildly enlarged with pulmonary vascular within normal limits. There is aortic atherosclerosis. No adenopathy. No bone lesions. IMPRESSION: 1.  Nasogastric tube tip in midesophagus. 2. Underlying emphysema with generalized interstitial thickening, likely reflecting chronic inflammatory type change. No frank edema or consolidation. 3.  Stable mild cardiac enlargement. 4.  There is aortic atherosclerosis. Aortic Atherosclerosis (ICD10-I70.0) and Emphysema (ICD10-J43.9). These results will be called to the ordering clinician or representative by the Radiologist Assistant, and communication documented in the PACS or zVision Dashboard. Electronically Signed   By: Bretta Bang III M.D.   On: 06/20/2017 17:17   Dg Chest Portable 1 View  Result Date: 06/19/2017 CLINICAL DATA:  Shortness of breath tonight. EXAM: PORTABLE CHEST 1 VIEW COMPARISON:  Radiographs 06/03/2017, chest CT 06/11/2017 FINDINGS: Mild cardiomegaly is unchanged. Aortic atherosclerosis unchanged. No consolidation, pleural effusion or pneumothorax. Mild upper lobe emphysema. Surgical staples in the midline of the upper abdomen again seen.  IMPRESSION: 1. No acute chest finding. 2. Mild cardiomegaly, unchanged. 3. Aortic Atherosclerosis (ICD10-I70.0) and Emphysema (ICD10-J43.9). Electronically Signed   By: Rubye Oaks M.D.   On: 06/19/2017 03:37   Dg Chest Portable 1 View  Result Date: 06/03/2017 CLINICAL DATA:  Respiratory failure. EXAM: PORTABLE CHEST 1 VIEW COMPARISON:  06/02/2017 FINDINGS: Cardiomegaly, NG tube entering the stomach with tip off the field of view again noted. Swan-Ganz catheter has been removed with remaining RIGHT IJ central venous catheter sheath. Mild RIGHT basilar atelectasis has slightly increased. There is no evidence of pneumothorax. No other changes identified. IMPRESSION: Swan-Ganz catheter removal and slight increase in RIGHT basilar atelectasis. No other significant change. Electronically Signed   By: Harmon Pier M.D.   On: 06/03/2017 08:18   Dg Chest Port 1 View  Result Date: 06/02/2017 CLINICAL DATA:  Post aortobifemoral bypass grafting, smoker, CHF, COPD, hypertension, coronary artery disease post MI EXAM: PORTABLE CHEST 1 VIEW COMPARISON:  Portable exam 0504 hours compared to 06/01/2017 FINDINGS: Tip of Swan-Ganz catheter projects over RIGHT pulmonary artery at pulmonary hilum. Nasogastric tube extends into stomach. Enlargement of cardiac silhouette with pulmonary vascular congestion. Atherosclerotic calcification aorta. Mild perihilar infiltrates likely minimal pulmonary edema. No pleural effusion or pneumothorax. IMPRESSION: Enlargement of cardiac silhouette with pulmonary vascular congestion and pulmonary edema, slightly increased. Electronically Signed   By: Ulyses Southward  M.D.   On: 06/02/2017 09:45   Dg Chest Port 1 View  Result Date: 06/01/2017 CLINICAL DATA:  Aspiration EXAM: PORTABLE CHEST 1 VIEW COMPARISON:  06/01/2017, 05/11/2017 FINDINGS: Esophageal tube tip extends below the diaphragm. Right IJ Swan-Ganz catheter tip advanced, now projects over right inter lobar pulmonary artery. No focal  opacity or pleural effusion. Stable slightly enlarged cardiomediastinal silhouette with mild central congestion. Mild diffuse interstitial opacities likely reflect minimal edema. Overall improved since prior study. No pneumothorax. IMPRESSION: 1. Right IJ Swan-Ganz catheter tip advanced and now projects over right inter lobar pulmonary artery 2. Cardiomegaly with vascular congestion and mild diffuse interstitial opacity suspect for minimal edema, overall decreased compared with prior radiograph. Electronically Signed   By: Jasmine Pang M.D.   On: 06/01/2017 20:21   Dg Chest Port 1 View  Result Date: 06/01/2017 CLINICAL DATA:  Post aortobifem bypass graft EXAM: PORTABLE CHEST 1 VIEW COMPARISON:  CT angio chest abdomen pelvis of 05/11/2017 FINDINGS: Very prominent interstitial markings are noted throughout the lungs. Some of these markings may be chronic, but superimposed interstitial edema is a consideration. No definite pleural effusion is noted. The heart is mildly enlarged. Swan-Ganz catheter tip is noted within the left main pulmonary artery. NG tube extends below the hemidiaphragm. IMPRESSION: 1. Very prominent interstitial markings some of which may be chronic, but superimposed interstitial edema cannot be excluded. No definite effusion. 2. Stable cardiomegaly with Swan-Ganz catheter tip within the left main pulmonary artery. Electronically Signed   By: Dwyane Dee M.D.   On: 06/01/2017 13:51   Dg Abd Portable 1v-small Bowel Obstruction Protocol-initial, 8 Hr Delay  Result Date: 06/20/2017 CLINICAL DATA:  Small bowel obstruction EXAM: PORTABLE ABDOMEN - 1 VIEW COMPARISON:  Study obtained earlier in the day FINDINGS: Nasogastric tube tip and side port are in the body of the stomach. There is currently no appreciable bowel dilatation, and there is no appreciable air-fluid level. There is contrast in the colon and rectum. There are skin staples in the midline. No free air. There is aortoiliac  atherosclerosis with a stent in the right common iliac artery. There is also femoral artery atherosclerosis. Lung bases are clear. IMPRESSION: Nasogastric tube tip and side port in stomach. No appreciable bowel dilatation or air-fluid level to indicate obstruction. No free air. Extensive vascular calcification including aortic atherosclerosis. Aortic Atherosclerosis (ICD10-I70.0). Electronically Signed   By: Bretta Bang III M.D.   On: 06/20/2017 19:11   Dg Abd Portable 1v  Result Date: 06/01/2017 CLINICAL DATA:  NG tube placement EXAM: PORTABLE ABDOMEN - 1 VIEW COMPARISON:  06/01/2017 FINDINGS: Cutaneous abdominal staples. Esophageal tube tip projects over the body of the stomach, side-port over the proximal stomach. Visible lung bases are clear. IMPRESSION: Enteric tube tip projects over the mid to distal stomach. Electronically Signed   By: Jasmine Pang M.D.   On: 06/01/2017 20:20   Dg Abd Portable 1v  Result Date: 06/01/2017 CLINICAL DATA:  Status post aortobifemoral bypass graft. EXAM: PORTABLE ABDOMEN - 1 VIEW COMPARISON:  04/28/2017 CT abdomen/pelvis FINDINGS: Enteric tube terminates in the proximal stomach. Vertical skin staples are noted just to the left of the lumbar spine. Extensive aorto bi-iliac atherosclerotic calcifications are noted with right common iliac stent graft. No disproportionately dilated small bowel loops. No evidence of pneumatosis or pneumoperitoneum. Mild colonic stool. No radiopaque nephrolithiasis. IMPRESSION: 1. Enteric tube terminates in the proximal stomach. 2. Nonobstructive bowel gas pattern. Electronically Signed   By: Delbert Phenix M.D.   On:  06/01/2017 13:51    (Echo, Carotid, EGD, Colonoscopy, ERCP)    Subjective:   Discharge Exam: Vitals:   06/21/17 0755 06/21/17 1146  BP:  (!) 149/91  Pulse:    Resp:  (!) 23  Temp:  98.1 F (36.7 C)  SpO2: 93% 95%   Vitals:   06/21/17 0348 06/21/17 0710 06/21/17 0755 06/21/17 1146  BP: 135/78 (!) 149/86   (!) 149/91  Pulse: 90 91    Resp: (!) 21 19  (!) 23  Temp: 98.8 F (37.1 C) 98.8 F (37.1 C)  98.1 F (36.7 C)  TempSrc: Oral Oral  Oral  SpO2: 94% 94% 93% 95%  Weight:      Height:        General: Pt is alert, awake, not in acute distress Cardiovascular: RRR, S1/S2 +, no rubs, no gallops Respiratory: Bilateral decreased air entry Abdominal: Soft, NT, ND, bowel sounds + Extremities: no edema, no cyanosis    The results of significant diagnostics from this hospitalization (including imaging, microbiology, ancillary and laboratory) are listed below for reference.     Microbiology: No results found for this or any previous visit (from the past 240 hour(s)).   Labs: BNP (last 3 results) Recent Labs    05/11/17 0808 06/11/17 1502 06/19/17 0354  BNP 1,284.4* 840.2* 279.4*   Basic Metabolic Panel: Recent Labs  Lab 06/19/17 0354 06/21/17 0305  NA 135 134*  K 3.6 3.3*  CL 106 104  CO2 21* 23  GLUCOSE 90 99  BUN <5* <5*  CREATININE 0.79 0.65  CALCIUM 8.0* 8.2*   Liver Function Tests: Recent Labs  Lab 06/19/17 0354  AST 18  ALT 13*  ALKPHOS 86  BILITOT 0.4  PROT 5.7*  ALBUMIN 2.5*   No results for input(s): LIPASE, AMYLASE in the last 168 hours. No results for input(s): AMMONIA in the last 168 hours. CBC: Recent Labs  Lab 06/19/17 0354 06/21/17 0305  WBC 6.3 4.1  NEUTROABS 4.0 2.4  HGB 12.4* 11.9*  HCT 37.0* 35.8*  MCV 98.1 98.9  PLT 249 184   Cardiac Enzymes: No results for input(s): CKTOTAL, CKMB, CKMBINDEX, TROPONINI in the last 168 hours. BNP: Invalid input(s): POCBNP CBG: No results for input(s): GLUCAP in the last 168 hours. D-Dimer No results for input(s): DDIMER in the last 72 hours. Hgb A1c No results for input(s): HGBA1C in the last 72 hours. Lipid Profile No results for input(s): CHOL, HDL, LDLCALC, TRIG, CHOLHDL, LDLDIRECT in the last 72 hours. Thyroid function studies No results for input(s): TSH, T4TOTAL, T3FREE, THYROIDAB in  the last 72 hours.  Invalid input(s): FREET3 Anemia work up No results for input(s): VITAMINB12, FOLATE, FERRITIN, TIBC, IRON, RETICCTPCT in the last 72 hours. Urinalysis    Component Value Date/Time   COLORURINE YELLOW 06/11/2017 1642   APPEARANCEUR CLEAR 06/11/2017 1642   LABSPEC <1.005 (L) 06/11/2017 1642   PHURINE 7.0 06/11/2017 1642   GLUCOSEU NEGATIVE 06/11/2017 1642   HGBUR NEGATIVE 06/11/2017 1642   BILIRUBINUR NEGATIVE 06/11/2017 1642   KETONESUR NEGATIVE 06/11/2017 1642   PROTEINUR NEGATIVE 06/11/2017 1642   NITRITE NEGATIVE 06/11/2017 1642   LEUKOCYTESUR NEGATIVE 06/11/2017 1642   Sepsis Labs Invalid input(s): PROCALCITONIN,  WBC,  LACTICIDVEN Microbiology No results found for this or any previous visit (from the past 240 hour(s)).   Time coordinating discharge: 35 minutes  SIGNED:   Burnadette Pop, MD  [facility] 06/21/2017, 3:22 PM Pager [phone]  If 7PM-7AM, please contact night-coverage [email or web address] Password [email or web address]

## 2017-06-23 ENCOUNTER — Encounter: Payer: Self-pay | Admitting: Vascular Surgery

## 2017-06-23 ENCOUNTER — Ambulatory Visit (INDEPENDENT_AMBULATORY_CARE_PROVIDER_SITE_OTHER): Payer: Self-pay | Admitting: Vascular Surgery

## 2017-06-23 ENCOUNTER — Other Ambulatory Visit: Payer: Self-pay

## 2017-06-23 VITALS — BP 131/79 | HR 94 | Temp 97.1°F | Resp 16 | Ht 63.0 in | Wt 102.0 lb

## 2017-06-23 DIAGNOSIS — I998 Other disorder of circulatory system: Secondary | ICD-10-CM

## 2017-06-23 DIAGNOSIS — I70229 Atherosclerosis of native arteries of extremities with rest pain, unspecified extremity: Secondary | ICD-10-CM

## 2017-06-23 NOTE — Progress Notes (Signed)
    Post-operative Open AAA Repair   History of Present Illness   Wayne [name] is a 62 y.o. male who presents post-op s/p ABF, R fem EA (06/01/17).  Patient has been admitted a couple of times for small bowel obstructions vs ileus.  Patient notes he had bowel issues prior to the ABF.   For VQI Use Only   PRE-ADM LIVING: Home  AMB STATUS: Ambulatory  Physical Examination   Vitals:   06/23/17 1120  BP: 131/79  Pulse: 94  Resp: 16  Temp: (!) 97.1 F (36.2 C)  TempSrc: Oral  SpO2: 95%  Weight: 102 lb (46.3 kg)  Height: 5\' 3"  (1.6 m)    Gastrointestinal: soft, NT, small fluid wave, -G/R, + HSM, - masses, - CVAT B, staples in place  ABI (06/04/17)  R:   ABI: 0.75 (0.35),   PT: bi  DP: bi  L:   ABI: 0.62 (0.66),   PT: mono  DP: bi   Medical Decision Making   Wayne [name] is a 62 y.o. male who presents s/p ABF. R fem EA, cirrhosis with associate ascites, severe pulm HTN, chronic abd pain   The patient will follow up with [place] in 12 months with BLE ABI and aortoiliac disease  Patient may need intestinal motility studies at some point.  I recommended increasing dietary fiber whether through increased fruit and vegetable vs metamucil use.  Pt sx were present prior to ABF, so I doubt this patient's sx are due to adhesions.  No bowel is transected usually as part of ABF, so most of the scarring is in the retroperitoneum not the peritoneum.  Thank you for allowing [place] to participate in this patient's care.   Leonides Sake, MD, FACS [facility] [place] Office: [phone] Pager: [phone]

## 2017-07-22 ENCOUNTER — Ambulatory Visit: Payer: Medicaid Other | Admitting: Family

## 2017-07-27 ENCOUNTER — Encounter: Payer: Self-pay | Admitting: Family

## 2017-07-27 ENCOUNTER — Ambulatory Visit (INDEPENDENT_AMBULATORY_CARE_PROVIDER_SITE_OTHER): Payer: Medicaid Other | Admitting: Family

## 2017-07-27 VITALS — BP 150/85 | HR 89 | Temp 97.0°F | Resp 16 | Ht 63.0 in | Wt 114.0 lb

## 2017-07-27 DIAGNOSIS — I700 Atherosclerosis of aorta: Secondary | ICD-10-CM | POA: Diagnosis not present

## 2017-07-27 DIAGNOSIS — F17200 Nicotine dependence, unspecified, uncomplicated: Secondary | ICD-10-CM

## 2017-07-27 DIAGNOSIS — Z95828 Presence of other vascular implants and grafts: Secondary | ICD-10-CM

## 2017-07-27 NOTE — Patient Instructions (Addendum)
Steps to Quit Smoking Smoking tobacco can be bad for your health. It can also affect almost every organ in your body. Smoking puts you and people around you at risk for many serious long-lasting (chronic) diseases. Quitting smoking is hard, but it is one of the best things that you can do for your health. It is never too late to quit. What are the benefits of quitting smoking? When you quit smoking, you lower your risk for getting serious diseases and conditions. They can include:  Lung cancer or lung disease.  Heart disease.  Stroke.  Heart attack.  Not being able to have children (infertility).  Weak bones (osteoporosis) and broken bones (fractures).  If you have coughing, wheezing, and shortness of breath, those symptoms may get better when you quit. You may also get sick less often. If you are pregnant, quitting smoking can help to lower your chances of having a baby of low birth weight. What can I do to help me quit smoking? Talk with your doctor about what can help you quit smoking. Some things you can do (strategies) include:  Quitting smoking totally, instead of slowly cutting back how much you smoke over a period of time.  Going to in-person counseling. You are more likely to quit if you go to many counseling sessions.  Using resources and support systems, such as: ? Online chats with a counselor. ? Phone quitlines. ? Printed self-help materials. ? Support groups or group counseling. ? Text messaging programs. ? Mobile phone apps or applications.  Taking medicines. Some of these medicines may have nicotine in them. If you are pregnant or breastfeeding, do not take any medicines to quit smoking unless your doctor says it is okay. Talk with your doctor about counseling or other things that can help you.  Talk with your doctor about using more than one strategy at the same time, such as taking medicines while you are also going to in-person counseling. This can help make  quitting easier. What things can I do to make it easier to quit? Quitting smoking might feel very hard at first, but there is a lot that you can do to make it easier. Take these steps:  Talk to your family and friends. Ask them to support and encourage you.  Call phone quitlines, reach out to support groups, or work with a counselor.  Ask people who smoke to not smoke around you.  Avoid places that make you want (trigger) to smoke, such as: ? Bars. ? Parties. ? Smoke-break areas at work.  Spend time with people who do not smoke.  Lower the stress in your life. Stress can make you want to smoke. Try these things to help your stress: ? Getting regular exercise. ? Deep-breathing exercises. ? Yoga. ? Meditating. ? Doing a body scan. To do this, close your eyes, focus on one area of your body at a time from head to toe, and notice which parts of your body are tense. Try to relax the muscles in those areas.  Download or buy apps on your mobile phone or tablet that can help you stick to your quit plan. There are many free apps, such as QuitGuide from the CDC (Centers for Disease Control and Prevention). You can find more support from smokefree.gov and other websites.  This information is not intended to replace advice given to you by your health care provider. Make sure you discuss any questions you have with your health care provider. Document Released: 12/20/2008 Document   Revised: 10/22/2015 Document Reviewed: 07/10/2014 Elsevier Interactive Patient Education  2018 Elsevier Inc.     Peripheral Vascular Disease Peripheral vascular disease (PVD) is a disease of the blood vessels that are not part of your heart and brain. A simple term for PVD is poor circulation. In most cases, PVD narrows the blood vessels that carry blood from your heart to the rest of your body. This can result in a decreased supply of blood to your arms, legs, and internal organs, like your stomach or kidneys.  However, it most often affects a person's lower legs and feet. There are two types of PVD.  Organic PVD. This is the more common type. It is caused by damage to the structure of blood vessels.  Functional PVD. This is caused by conditions that make blood vessels contract and tighten (spasm).  Without treatment, PVD tends to get worse over time. PVD can also lead to acute ischemic limb. This is when an arm or limb suddenly has trouble getting enough blood. This is a medical emergency. Follow these instructions at home:  Take medicines only as told by your doctor.  Do not use any tobacco products, including cigarettes, chewing tobacco, or electronic cigarettes. If you need help quitting, ask your doctor.  Lose weight if you are overweight, and maintain a healthy weight as told by your doctor.  Eat a diet that is low in fat and cholesterol. If you need help, ask your doctor.  Exercise regularly. Ask your doctor for some good activities for you.  Take good care of your feet. ? Wear comfortable shoes that fit well. ? Check your feet often for any cuts or sores. Contact a doctor if:  You have cramps in your legs while walking.  You have leg pain when you are at rest.  You have coldness in a leg or foot.  Your skin changes.  You are unable to get or have an erection (erectile dysfunction).  You have cuts or sores on your feet that are not healing. Get help right away if:  Your arm or leg turns cold and blue.  Your arms or legs become red, warm, swollen, painful, or numb.  You have chest pain or trouble breathing.  You suddenly have weakness in your face, arm, or leg.  You become very confused or you cannot speak.  You suddenly have a very bad headache.  You suddenly cannot see. This information is not intended to replace advice given to you by your health care provider. Make sure you discuss any questions you have with your health care provider. Document Released:  05/20/2009 Document Revised: 08/01/2015 Document Reviewed: 08/03/2013 Elsevier Interactive Patient Education  2017 Elsevier Inc.  

## 2017-07-27 NOTE — Progress Notes (Signed)
Postoperative Visit   History of Present Illness  Wayne [name] is a 62 y.o. year old male who is s/p aortobifemoral bypass, right femoral endarterectomy, and Left femoral anastomosis of aortofemoral bypass graft (06/01/17) by Dr. Imogene Burn and Dr. Edilia Bo.   Operative findings include somewhat cirrhotic liver with small amount of ascites.  Pt also has severe pulm HTN, chronic abd pain.  Dr. Imogene Burn last evaluated pt on 06-23-17. At that time Dr. Imogene Burn advised patient to follow up with [place] in 12 months with BLE ABI. Patient may need intestinal motility studies at some point. Dr. Imogene Burn recommended increasing dietary fiber whether through increased fruit and vegetable vs metamucil use. Pt sx were present prior to ABF, so Dr. Imogene Burn doubted this patient's sx were due to adhesions.  No bowel is transected usually as part of ABF, so most of the scarring is in the retroperitoneum not the peritoneum.  Pt returns today with c/o "knot in my left groin since surgery"; also swelling in his lower legs that started about 3 weeks ago. He denies fever or chills, denies pain in his left groin.    Pt states ultrasound of his legs were done at [facility] about 1.5 to 2 weeks ago, no results available in our office.I phoned [facility], spoke with Rosey Bath, who gave me the results of bilateral LE venous duplex (07-19-17) as negative for DVT    Patient has been admitted a couple of times for small bowel obstructions vs ileus.  Patient notes he had bowel issues prior to the ABF.   The patient is able to complete his activities of daily living.    For VQI Use Only  PRE-ADM LIVING: Home  AMB STATUS: Ambulatory    Past Medical History:  Diagnosis Date  . Abnormal pulmonary finding    a. preadmission note indicates pt followed by Dr. Bethanie Dicker for CHF, lung mass, COPD, pulmonary mycosis with plan for 3 mo f/u with CT by 03/2017 note.  . Alcohol abuse   . Anemia   . Arthritis    "right  hip; lower back" (05/11/2017)  . Chronic systolic CHF (congestive heart failure) (HCC)    a. EF 20-25% by echo 2017. b. 40-45% by echo in 01/2017.  Marland Kitchen COPD (chronic obstructive pulmonary disease) (HCC)   . Dyspnea   . GERD (gastroesophageal reflux disease)   . History of blood transfusion 2004   "related to bleeding stomach ulcerts"  . History of stomach ulcers 2004  . Hypercholesterolemia   . Hypertension   . Malnutrition (HCC)   . Moderate mitral regurgitation   . Myocardial infarction [facility]) 2004   "think it was bleeding ulcers; not a heart attack" (05/11/2017), pt denies heart cath  . Peripheral vascular disease (HCC)   . Pre-diabetes   . Pulmonary hypertension (HCC)    a. severe pulm HTN by echo 01/2017.  . Tricuspid regurgitation 2017    Past Surgical History:  Procedure Laterality Date  . ABDOMINAL AORTOGRAM W/LOWER EXTREMITY N/A 04/01/2017   Procedure: ABDOMINAL AORTOGRAM W/LOWER EXTREMITY;  Surgeon: Fransisco Hertz, MD;  Location: [facility] INVASIVE CV LAB;  Service: Cardiovascular;  Laterality: N/A;  Bilateral  . AORTA - BILATERAL FEMORAL ARTERY BYPASS GRAFT N/A 06/01/2017   Procedure: AORTA BIFEMORAL BYPASS GRAFT with Graft and Right Femoral Endartarectomy;  Surgeon: Fransisco Hertz, MD;  Location: [facility] OR;  Service: Vascular;  Laterality: N/A;  . COLONOSCOPY    . HEMORRHOIDECTOMY WITH HEMORRHOID BANDING    .  MASS EXCISION Left 04/13/2017   Procedure: EXCISION EPIDERMAL INCLUSION CYST LEFT GROIN;  Surgeon: Fransisco Hertz, MD;  Location: [facility] OR;  Service: Vascular;  Laterality: Left;  . PERIPHERAL VASCULAR INTERVENTION Left    "groin; for clogged artery"    Family History  Problem Relation Age of Onset  . Hypertension Sister   . Hyperlipidemia Sister   . Diabetes Sister   . Peripheral Artery Disease Father      Social History   Socioeconomic History  . Marital status: Married    Spouse name: Not on file  . Number of children: Not on file  . Years of education: Not on file  .  Highest education level: Not on file  Occupational History  . Not on file  Social Needs  . Financial resource strain: Not on file  . Food insecurity:    Worry: Not on file    Inability: Not on file  . Transportation needs:    Medical: Not on file    Non-medical: Not on file  Tobacco Use  . Smoking status: Current Every Day Smoker    Packs/day: 0.25    Years: 50.00    Pack years: 12.50    Types: Cigarettes  . Smokeless tobacco: Never Used  Substance and Sexual Activity  . Alcohol use: Yes    Alcohol/week: 4.8 oz    Types: 8 Cans of beer per week  . Drug use: No  . Sexual activity: Not Currently  Lifestyle  . Physical activity:    Days per week: Not on file    Minutes per session: Not on file  . Stress: Not on file  Relationships  . Social connections:    Talks on phone: Not on file    Gets together: Not on file    Attends religious service: Not on file    Active member of club or organization: Not on file    Attends meetings of clubs or organizations: Not on file    Relationship status: Not on file  . Intimate partner violence:    Fear of current or ex partner: Not on file    Emotionally abused: Not on file    Physically abused: Not on file    Forced sexual activity: Not on file  Other Topics Concern  . Not on file  Social History Narrative  . Not on file    Allergies  Allergen Reactions  . Penicillins Itching, Rash and Other (See Comments)    PATIENT HAS HAD A PCN REACTION WITH IMMEDIATE RASH, FACIAL/TONGUE/THROAT SWELLING, SOB, OR LIGHTHEADEDNESS WITH HYPOTENSION:  #  #  #  YES  #  #  #   Has patient had a PCN reaction causing severe rash involving mucus membranes or skin necrosis: No Has patient had a PCN reaction that required hospitalization: No Has patient had a PCN reaction occurring within the last 10 years: No If all of the above answers are "NO", then may proceed with Cephalosporin use.     Current Outpatient Medications on File Prior to Visit    Medication Sig Dispense Refill  . acetaminophen (TYLENOL) 500 MG tablet Take 2 tablets (1,000 mg total) by mouth every 6 (six) hours as needed. (Patient taking differently: Take 250 mg by mouth daily as needed for moderate pain or headache. ) 30 tablet 0  . albuterol (PROVENTIL HFA;VENTOLIN HFA) 108 (90 Base) MCG/ACT inhaler Inhale 2 puffs into the lungs every 6 (six) hours as needed for wheezing or shortness of breath.    Marland Kitchen  aspirin EC 81 MG tablet Take 81 mg by mouth daily.    Marland Kitchen atorvastatin (LIPITOR) 20 MG tablet Take 20 mg by mouth daily.    . budesonide-formoterol (SYMBICORT) 160-4.5 MCG/ACT inhaler Inhale 2 puffs into the lungs 2 (two) times daily.    . furosemide (LASIX) 20 MG tablet Take 1 tablet (20 mg total) by mouth daily. 30 tablet 0  . Multiple Vitamin (MULTIVITAMIN WITH MINERALS) TABS tablet Take 1 tablet by mouth daily. 30 tablet 0  . omeprazole (PRILOSEC) 20 MG capsule Take 1 capsule (20 mg total) by mouth daily as needed (for indegestion). 30 capsule 0  . folic acid (FOLVITE) 1 MG tablet Take 1 tablet (1 mg total) by mouth daily. (Patient not taking: Reported on 07/27/2017) 30 tablet 0  . nicotine (NICODERM CQ - DOSED IN MG/24 HOURS) 21 mg/24hr patch Place 1 patch (21 mg total) onto the skin daily. (Patient not taking: Reported on 07/27/2017) 28 patch 0  . ondansetron (ZOFRAN ODT) 4 MG disintegrating tablet Take 1 tablet (4 mg total) by mouth every 8 (eight) hours as needed for nausea or vomiting. (Patient not taking: Reported on 07/27/2017) 10 tablet 1  . polyethylene glycol (MIRALAX) packet Take 17 g by mouth daily. (Patient not taking: Reported on 07/27/2017) 30 each 0  . potassium chloride 20 MEQ TBCR Take 20 mEq by mouth daily. (Patient not taking: Reported on 07/27/2017) 7 tablet 0  . senna-docusate (SENOKOT S) 8.6-50 MG tablet Take 1 tablet by mouth 2 (two) times daily. (Patient not taking: Reported on 07/27/2017)    . sildenafil (REVATIO) 20 MG tablet Take 1 tablet (20 mg total) by  mouth 3 (three) times daily. (Patient not taking: Reported on 07/27/2017) 90 tablet 0  . thiamine 100 MG tablet Take 1 tablet (100 mg total) by mouth daily. (Patient not taking: Reported on 07/27/2017) 30 tablet 0  . traMADol (ULTRAM) 50 MG tablet Take 1 tablet (50 mg total) by mouth every 6 (six) hours as needed for up to 10 doses. (Patient not taking: Reported on 07/27/2017) 10 tablet 0   No current facility-administered medications on file prior to visit.       Physical Examination  Vitals:   07/27/17 1351 07/27/17 1356  BP: (!) 154/84 (!) 150/85  Pulse: 95 89  Resp: 16   Temp: (!) 97 F (36.1 C)   TempSrc: Oral   SpO2: (!) 85% 91%  Weight: 114 lb (51.7 kg)   Height: 5\' 3"  (1.6 m)    Body mass index is 20.19 kg/m.   Midline abdominal and bilateral groin incisions are well healed. Soft non pulsatile mass, seroma left groin, 1.5 cm diameter, no erythema.  Bilateral DP, PT and peroneal pulses with brisk Doppler signals, pedal pulses not palpable.  No signs of ischemia in feet or legs.  Trace bilateral pretibial pitting edema, mild erythema at anterior aspects of both lower legs.   DATA ([facility]):  Carotid Duplex 07-19-17): <39% bilateral ICA stenosis Antegrade vertebral artery flow  DVT Duplex (07-19-17): No DVT in both lower extremities   ABI (07-19-17): Right: 0.72 Left: 0.67    Medical Decision Making  Wayne [name] is a 62 y.o. year old male who presents s/p aortobifemoral bypass, right femoral endarterectomy, and Left femoral anastomosis of aortofemoral bypass graft (06/01/17) by Dr. Imogene Burn and Dr. Edilia Bo.   Pt was referred by [facility] to evaluate left groin mass which appears to be a seroma; hopefully this will resorb,  decrease in size of the next couple of weeks; no signs of infection.   Trace bilateral pretibial pitting edema with mild erythema; negative DVT exam on 07-19-17 from [facility]; possibly reperfusion edema  after arterial perfusion restored.   Unfortunately he continues to smoke; I discussed this with pt as the most likely reason for his arteries getting blocked; he seemed to not listen.   Dr. Arbie Cookey spoke with and examined pt. Pt to return in 3 weeks and see Dr. Imogene Burn for re-evaluation of seroma left groin.    The patient's bypass incisions are healing appropriately with resolution of pre-operative symptoms.   Thank you for allowing [place] to participate in this patient's care.  Charisse March, RN, MSN, FNP-C [facility] [place] [facility] [place] Office: [phone]  07/27/2017, 2:20 PM  Clinic MD: Early

## 2017-07-28 ENCOUNTER — Encounter: Payer: Self-pay | Admitting: Cardiology

## 2017-08-24 NOTE — Progress Notes (Signed)
    Post-operative Open AAA Repair   History of Present Illness   NICKALIS WARRELL is a 62 y.o. male who presents post-op s/p ABF, R fem EA (06/01/17).   The patient was seen in the office on 07/27/17 with a L groin seroma.  Pt has been admitted a couple of times for small bowel obstructions vs ileus.  This patient has had bowel issues prior to the ABF.  This patient has severe pulmonary HTN which complicated his post-op recovery.  Pt has known hepatic cirrhosis.  Pt denies any fever or chills.  The left groin seroma is relatively asx.  Pt notes some neuropathic sx down each medial thigh.   For VQI Use Only   PRE-ADM LIVING: Home  AMB STATUS: Ambulatory   Physical Examination   Vitals:   08/25/17 1136  BP: 125/75  Pulse: 95  Resp: 14  Temp: 97.8 F (36.6 C)  TempSrc: Oral  SpO2: 95%  Weight: 107 lb 14.4 oz (48.9 kg)  Height: 5\' 3"  (1.6 m)    Gastrointestinal: soft, NTND, -G/R, - HSM, - masses, - CVAT B, R groin healed, L groin healed, small ballotable superficial mass in L groin, on Sonosite: hypoechoic fluid filled mass distal to the anastomosis   Medical Decision Making   DEANGLEO KUBECKA is a 62 y.o. male who presents s/p ABF, R fem EA, cirrhosis with associated ascites, severe pulm HTN, chronic abd pain   Given this patient's difficulty healing due to cirrhosis and proximity of the likely seroma to the left aortobifemoral graft anastomosis, I do not recommend excision of the seroma.    I would watch the seroma for now and see if it reabsorbs with time.    Follow up in 6 months for re-check.  We discussed having the patient follow up earlier if he develops any fever or chills or increased seroma size.  Additionally, the patient describes what sounds like delayed reintervation of the medial thigh likely related to delay in cutaneous nerve regeneration.  This has NOT developed into hyperesthesia so I don't think Neurontin or Lyrica is necessary at thsi time.  Thank  you for allowing [place] to participate in this patient's care.   Leonides Sake, MD, FACS [facility] [place] Office: [phone] Pager: [phone]

## 2017-08-25 ENCOUNTER — Ambulatory Visit (INDEPENDENT_AMBULATORY_CARE_PROVIDER_SITE_OTHER): Payer: Medicaid Other | Admitting: Vascular Surgery

## 2017-08-25 ENCOUNTER — Encounter: Payer: Self-pay | Admitting: Vascular Surgery

## 2017-08-25 ENCOUNTER — Other Ambulatory Visit: Payer: Self-pay

## 2017-08-25 VITALS — BP 125/75 | HR 95 | Temp 97.8°F | Resp 14 | Ht 63.0 in | Wt 107.9 lb

## 2017-08-25 DIAGNOSIS — I7410 Embolism and thrombosis of unspecified parts of aorta: Secondary | ICD-10-CM

## 2017-08-25 DIAGNOSIS — I700 Atherosclerosis of aorta: Secondary | ICD-10-CM

## 2017-08-25 DIAGNOSIS — I998 Other disorder of circulatory system: Secondary | ICD-10-CM

## 2017-08-25 DIAGNOSIS — I70229 Atherosclerosis of native arteries of extremities with rest pain, unspecified extremity: Secondary | ICD-10-CM

## 2018-02-09 ENCOUNTER — Ambulatory Visit: Payer: Medicaid Other | Admitting: Vascular Surgery

## 2018-11-03 ENCOUNTER — Other Ambulatory Visit: Payer: Self-pay

## 2018-11-03 DIAGNOSIS — I998 Other disorder of circulatory system: Secondary | ICD-10-CM

## 2018-11-03 DIAGNOSIS — I70229 Atherosclerosis of native arteries of extremities with rest pain, unspecified extremity: Secondary | ICD-10-CM

## 2018-11-17 ENCOUNTER — Telehealth (HOSPITAL_COMMUNITY): Payer: Self-pay

## 2018-11-17 NOTE — Telephone Encounter (Signed)

## 2018-11-18 ENCOUNTER — Ambulatory Visit (INDEPENDENT_AMBULATORY_CARE_PROVIDER_SITE_OTHER): Payer: Medicare Other | Admitting: Vascular Surgery

## 2018-11-18 ENCOUNTER — Ambulatory Visit (HOSPITAL_COMMUNITY)
Admission: RE | Admit: 2018-11-18 | Discharge: 2018-11-18 | Disposition: A | Discharge status: Home / Self Care | Payer: Medicare Other | Source: Ambulatory Visit | Attending: Vascular Surgery | Admitting: Vascular Surgery

## 2018-11-18 ENCOUNTER — Other Ambulatory Visit: Payer: Self-pay

## 2018-11-18 ENCOUNTER — Ambulatory Visit (INDEPENDENT_AMBULATORY_CARE_PROVIDER_SITE_OTHER)
Admission: RE | Admit: 2018-11-18 | Discharge: 2018-11-18 | Disposition: A | Discharge status: Home / Self Care | Payer: Medicare Other | Source: Ambulatory Visit | Attending: Vascular Surgery | Admitting: Vascular Surgery

## 2018-11-18 ENCOUNTER — Encounter: Payer: Self-pay | Admitting: Vascular Surgery

## 2018-11-18 VITALS — BP 139/84 | HR 78 | Temp 97.3°F | Resp 20 | Ht 63.0 in | Wt 105.9 lb

## 2018-11-18 DIAGNOSIS — I998 Other disorder of circulatory system: Secondary | ICD-10-CM | POA: Diagnosis not present

## 2018-11-18 DIAGNOSIS — Z95828 Presence of other vascular implants and grafts: Secondary | ICD-10-CM

## 2018-11-18 DIAGNOSIS — I70229 Atherosclerosis of native arteries of extremities with rest pain, unspecified extremity: Secondary | ICD-10-CM

## 2018-11-18 DIAGNOSIS — F17200 Nicotine dependence, unspecified, uncomplicated: Secondary | ICD-10-CM

## 2018-11-18 DIAGNOSIS — I739 Peripheral vascular disease, unspecified: Secondary | ICD-10-CM

## 2018-11-18 NOTE — Progress Notes (Signed)
Patient ID: Wayne [name], male   DOB: 19-May-1955, 63 y.o.   MRN: [id number]  Reason for Consult: Follow-up   Referred by Wayne [name]*  Subjective:     HPI:  Wayne [name] is a 63 y.o. male presents for evaluation aortobifemoral bypass performed by Dr. Imogene [name] in March 2019.  Following that he did have a seroma in his groin which Dr. Imogene [name] had discussed possibly fixing.  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  Today his chief complaints include knots in bilateral groins and itching of his right groin up onto his right lower abdomen.  He states that he would like to have procedures to help with both of these complaints.  He continues to smoke daily.  He is upset today that Dr. Imogene [name] is no longer here.  Past Medical History:  Diagnosis Date  . Abnormal pulmonary finding    a. preadmission note indicates pt followed by Dr. Bethanie [name] for CHF, lung mass, COPD, pulmonary mycosis with plan for 3 mo f/u with CT by 03/2017 note.  . Alcohol abuse   . Anemia   . Arthritis    "right hip; lower back" (05/11/2017)  . Chronic systolic CHF (congestive heart failure) (HCC)    a. EF 20-25% by echo 2017. b. 40-45% by echo in 01/2017.  Marland Kitchen COPD (chronic obstructive pulmonary disease) (HCC)   . Dyspnea   . GERD (gastroesophageal reflux disease)   . History of blood transfusion 2004   "related to bleeding stomach ulcerts"  . History of stomach ulcers 2004  . Hypercholesterolemia   . Hypertension   . Malnutrition (HCC)   . Moderate mitral regurgitation   . Myocardial infarction [facility]) 2004   "think it was bleeding ulcers; not a heart attack" (05/11/2017), pt denies heart cath  . Peripheral vascular disease (HCC)   . Pre-diabetes   . Pulmonary hypertension (HCC)    a. severe pulm HTN by echo 01/2017.  . Tricuspid regurgitation 2017   Family History  Problem Relation Age of Onset  . Hypertension Sister   . Hyperlipidemia Sister   . Diabetes  Sister   . Peripheral Artery Disease Father    Past Surgical History:  Procedure Laterality Date  . ABDOMINAL AORTOGRAM W/LOWER EXTREMITY N/A 04/01/2017   Procedure: ABDOMINAL AORTOGRAM W/LOWER EXTREMITY;  Surgeon: Wayne Hertz, MD;  Location: [facility] INVASIVE CV LAB;  Service: Cardiovascular;  Laterality: N/A;  Bilateral  . AORTA - BILATERAL FEMORAL ARTERY BYPASS GRAFT N/A 06/01/2017   Procedure: AORTA BIFEMORAL BYPASS GRAFT with Graft and Right Femoral Endartarectomy;  Surgeon: Wayne Hertz, MD;  Location: [facility] OR;  Service: Vascular;  Laterality: N/A;  . COLONOSCOPY    . HEMORRHOIDECTOMY WITH HEMORRHOID BANDING    . MASS EXCISION Left 04/13/2017   Procedure: EXCISION EPIDERMAL INCLUSION CYST LEFT GROIN;  Surgeon: Wayne Hertz, MD;  Location: [facility] OR;  Service: Vascular;  Laterality: Left;  . PERIPHERAL VASCULAR INTERVENTION Left    "groin; for clogged artery"    Short Social History:  Social History   Tobacco Use  . Smoking status: Current Every Day Smoker    Packs/day: 0.25    Years: 50.00    Pack years: 12.50    Types: Cigarettes  . Smokeless tobacco: Never Used  Substance Use Topics  . Alcohol use: Yes    Alcohol/week: 8.0 standard drinks    Types: 8 Cans of beer per week    Allergies  Allergen Reactions  .  Penicillins Itching, Rash and Other (See Comments)    PATIENT HAS HAD A PCN REACTION WITH IMMEDIATE RASH, FACIAL/TONGUE/THROAT SWELLING, SOB, OR LIGHTHEADEDNESS WITH HYPOTENSION:  #  #  #  YES  #  #  #   Has patient had a PCN reaction causing severe rash involving mucus membranes or skin necrosis: No Has patient had a PCN reaction that required hospitalization: No Has patient had a PCN reaction occurring within the last 10 years: No If all of the above answers are "NO", then may proceed with Cephalosporin use.     Current Outpatient Medications  Medication Sig Dispense Refill  . acetaminophen (TYLENOL) 500 MG tablet Take 2 tablets (1,000 mg total) by mouth every 6 (six)  hours as needed. (Patient taking differently: Take 250 mg by mouth daily as needed for moderate pain or headache. ) 30 tablet 0  . albuterol (PROVENTIL HFA;VENTOLIN HFA) 108 (90 Base) MCG/ACT inhaler Inhale 2 puffs into the lungs every 6 (six) hours as needed for wheezing or shortness of breath.    Marland Kitchen aspirin EC 81 MG tablet Take 81 mg by mouth daily.    Marland Kitchen atorvastatin (LIPITOR) 20 MG tablet Take 20 mg by mouth daily.    . budesonide-formoterol (SYMBICORT) 160-4.5 MCG/ACT inhaler Inhale 2 puffs into the lungs 2 (two) times daily.    . furosemide (LASIX) 20 MG tablet Take 1 tablet (20 mg total) by mouth daily. 30 tablet 0  . omeprazole (PRILOSEC) 20 MG capsule Take 1 capsule (20 mg total) by mouth daily as needed (for indegestion). 30 capsule 0   No current facility-administered medications for this visit.     Review of Systems  Constitutional:  Constitutional negative. HENT: HENT negative.  Eyes: Eyes negative.  Respiratory: Positive for shortness of breath.  Cardiovascular: Positive for claudication.  GI: Gastrointestinal negative.  Musculoskeletal: Musculoskeletal negative.  Skin: Skin negative.  Neurological: Neurological negative. Hematologic: Hematologic/lymphatic negative.  Psychiatric: Psychiatric negative.        Objective:  Objective   Vitals:   11/18/18 0925  BP: 139/84  Pulse: 78  Resp: 20  Temp: (!) 97.3 F (36.3 C)  SpO2: 95%  Weight: 105 lb 14.4 oz (48 kg)  Height: 5\' 3"  (1.6 m)   Body mass index is 18.76 kg/m.  Physical Exam HENT:     Head: Normocephalic.  Neck:     Musculoskeletal: Normal range of motion and neck supple.     Vascular: No carotid bruit.  Cardiovascular:     Rate and Rhythm: Regular rhythm.     Pulses:          Carotid pulses are 2+ on the right side and 2+ on the left side.      Radial pulses are 2+ on the right side and 2+ on the left side.       Femoral pulses are 2+ on the right side and 2+ on the left side.      Popliteal  pulses are 0 on the right side and 0 on the left side.     Heart sounds: Normal heart sounds.  Pulmonary:     Effort: Pulmonary effort is normal.     Breath sounds: Normal breath sounds.  Abdominal:     General: Abdomen is flat.     Palpations: Abdomen is soft. There is no mass.  Musculoskeletal: Normal range of motion.        General: No swelling.     Comments: Bilateral groins have palpable pulses I  do not appreciate any seromas there are no skin changes  Skin:    General: Skin is warm and dry.     Capillary Refill: Capillary refill takes less than 2 seconds.  Neurological:     General: No focal deficit present.     Mental Status: He is alert.  Psychiatric:        Mood and Affect: Mood normal.        Behavior: Behavior normal.        Thought Content: Thought content normal.        Judgment: Judgment normal.     Data: I have independently interpreted his aortobifemoral bypass graft duplex which demonstrates patency without any stenosis.  ABIs are 0.68 bilaterally.     Assessment/Plan:     63 year old male follows up 1/2 years after aortobifemoral bypass by Dr. Imogene Burnhen who is no longer with this practice.  Patient wants to have procedure for bilateral groin scarring and itching.  I discussed with him that I would not recommend any surgeries as I think the scarring and swelling is really at this point just the graft but he states that he was promised an operation by Dr. Imogene Burnhen although this is not documented Dr. Nicky Pughhen's notes.  Patient also has claudication with known SFA occlusions and he is upset that this was not addressed with Dr. Nicky Pughhen's original operation.  I discussed with him the need for urgent smoking cessation he states that this is not the reason for his problems.  Overall patient would like to see someone other than me in the future.  I discussed with him that there is no surgery that needs to be done now he is unwilling to hear of any of this.  We can see him back in 6 months  with repeat ABIs.  Unfortunately patient is unsatisfied with the answers he got in our office today but from a vascular standpoint there is no further surgeries I would offer him and I would not consider lower extremity intervention until he has completely quit smoking at this point as he has no wounds or rest pain.     Maeola HarmanBrandon Christopher  MD [facility]

## 2020-08-22 ENCOUNTER — Emergency Department (HOSPITAL_BASED_OUTPATIENT_CLINIC_OR_DEPARTMENT_OTHER): Payer: Medicare Other

## 2020-08-22 ENCOUNTER — Other Ambulatory Visit: Payer: Self-pay

## 2020-08-22 ENCOUNTER — Emergency Department (HOSPITAL_BASED_OUTPATIENT_CLINIC_OR_DEPARTMENT_OTHER)
Admission: EM | Admit: 2020-08-22 | Discharge: 2020-08-22 | Disposition: A | Discharge status: Home / Self Care | Payer: Medicare Other | Attending: Emergency Medicine | Admitting: Emergency Medicine

## 2020-08-22 ENCOUNTER — Encounter (HOSPITAL_BASED_OUTPATIENT_CLINIC_OR_DEPARTMENT_OTHER): Payer: Self-pay

## 2020-08-22 DIAGNOSIS — F17210 Nicotine dependence, cigarettes, uncomplicated: Secondary | ICD-10-CM | POA: Diagnosis not present

## 2020-08-22 DIAGNOSIS — Z20822 Contact with and (suspected) exposure to covid-19: Secondary | ICD-10-CM | POA: Insufficient documentation

## 2020-08-22 DIAGNOSIS — I5032 Chronic diastolic (congestive) heart failure: Secondary | ICD-10-CM | POA: Insufficient documentation

## 2020-08-22 DIAGNOSIS — E871 Hypo-osmolality and hyponatremia: Secondary | ICD-10-CM | POA: Diagnosis not present

## 2020-08-22 DIAGNOSIS — R2232 Localized swelling, mass and lump, left upper limb: Secondary | ICD-10-CM | POA: Insufficient documentation

## 2020-08-22 DIAGNOSIS — M25522 Pain in left elbow: Secondary | ICD-10-CM | POA: Insufficient documentation

## 2020-08-22 DIAGNOSIS — I110 Hypertensive heart disease with heart failure: Secondary | ICD-10-CM | POA: Diagnosis not present

## 2020-08-22 DIAGNOSIS — J449 Chronic obstructive pulmonary disease, unspecified: Secondary | ICD-10-CM | POA: Diagnosis not present

## 2020-08-22 LAB — CBC WITH DIFFERENTIAL/PLATELET
Abs Immature Granulocytes: 0.05 10*3/uL (ref 0.00–0.07)
Basophils Absolute: 0.1 10*3/uL (ref 0.0–0.1)
Basophils Relative: 1 %
Eosinophils Absolute: 0 10*3/uL (ref 0.0–0.5)
Eosinophils Relative: 0 %
HCT: 48.7 % (ref 39.0–52.0)
Hemoglobin: 17.1 g/dL — ABNORMAL HIGH (ref 13.0–17.0)
Immature Granulocytes: 1 %
Lymphocytes Relative: 12 %
Lymphs Abs: 1.1 10*3/uL (ref 0.7–4.0)
MCH: 33.1 pg (ref 26.0–34.0)
MCHC: 35.1 g/dL (ref 30.0–36.0)
MCV: 94.4 fL (ref 80.0–100.0)
Monocytes Absolute: 1.1 10*3/uL — ABNORMAL HIGH (ref 0.1–1.0)
Monocytes Relative: 11 %
Neutro Abs: 7.3 10*3/uL (ref 1.7–7.7)
Neutrophils Relative %: 75 %
Platelets: 160 10*3/uL (ref 150–400)
RBC: 5.16 MIL/uL (ref 4.22–5.81)
RDW: 14.8 % (ref 11.5–15.5)
WBC: 9.6 10*3/uL (ref 4.0–10.5)
nRBC: 0 % (ref 0.0–0.2)

## 2020-08-22 LAB — COMPREHENSIVE METABOLIC PANEL
ALT: 32 U/L (ref 0–44)
AST: 43 U/L — ABNORMAL HIGH (ref 15–41)
Albumin: 4 g/dL (ref 3.5–5.0)
Alkaline Phosphatase: 84 U/L (ref 38–126)
Anion gap: 9 (ref 5–15)
BUN: 9 mg/dL (ref 8–23)
CO2: 23 mmol/L (ref 22–32)
Calcium: 8.9 mg/dL (ref 8.9–10.3)
Chloride: 93 mmol/L — ABNORMAL LOW (ref 98–111)
Creatinine, Ser: 0.86 mg/dL (ref 0.61–1.24)
GFR, Estimated: >60 mL/min (ref >60)
Glucose, Bld: 97 mg/dL (ref 70–99)
Potassium: 5.1 mmol/L (ref 3.5–5.1)
Sodium: 125 mmol/L — ABNORMAL LOW (ref 135–145)
Total Bilirubin: 1.6 mg/dL — ABNORMAL HIGH (ref 0.3–1.2)
Total Protein: 8.1 g/dL (ref 6.5–8.1)

## 2020-08-22 LAB — RESP PANEL BY RT-PCR (FLU A&B, COVID) ARPGX2
Influenza A by PCR: NEGATIVE
Influenza B by PCR: NEGATIVE
SARS Coronavirus 2 by RT PCR: NEGATIVE

## 2020-08-22 LAB — BRAIN NATRIURETIC PEPTIDE: B Natriuretic Peptide: 387.3 pg/mL — ABNORMAL HIGH (ref 0.0–100.0)

## 2020-08-22 MED ORDER — OXYCODONE-ACETAMINOPHEN 5-325 MG PO TABS: 1.0000 | ORAL_TABLET | ORAL | 0 refills | Status: DC | PRN

## 2020-08-22 MED ORDER — OXYCODONE-ACETAMINOPHEN 5-325 MG PO TABS
1.0000 | ORAL_TABLET | Freq: Once | ORAL | Status: AC
  Administered 2020-08-22: 1 via ORAL
  Filled 2020-08-22: qty 1

## 2020-08-22 MED ORDER — NAPROXEN 500 MG PO TABS: 500.0000 mg | ORAL_TABLET | Freq: Two times a day (BID) | ORAL | 0 refills | Status: AC

## 2020-08-22 MED ORDER — KETOROLAC TROMETHAMINE 30 MG/ML IJ SOLN
15.0000 mg | Freq: Once | INTRAMUSCULAR | Status: AC
  Administered 2020-08-22: 15 mg via INTRAVENOUS
  Filled 2020-08-22: qty 1

## 2020-08-22 NOTE — ED Triage Notes (Addendum)
Pt c/o swelling to left elbow started last night-denies injury-DOE and tachypnea noted-pt states he has COPD but is not on home O2-steady gait

## 2020-08-22 NOTE — ED Notes (Signed)
Pt states his pain is much improved, VSS

## 2020-08-22 NOTE — ED Provider Notes (Signed)
Emergency Department Provider Note   I have reviewed the triage vital signs and the nursing notes.   HISTORY  Chief Complaint Joint Swelling   HPI Wayne [name] is a 64 y.o. male with past medical history of COPD, CHF, HLD, PAD status post aortobifemoral bypass graft presents to the emergency department with left elbow pain which began without trauma last night.  Patient states that the elbow swells intermittently and it typically responds to anti-inflammatory medication.  He has not had fevers or chills.  He feels pain with moving the elbow.  Denies any redness or warmth of the joint.  No pain in his elbow or wrist.  On arrival to the emergency department his oxygen saturations are low.  He does not wear home oxygen.  He denies severe shortness of breath or chest pains.  No cough or congestion symptoms.  No pain in the abdomen or back has has been the case with complications with his bypass graft in the past.   Past Medical History:  Diagnosis Date   Abnormal pulmonary finding    a. preadmission note indicates pt followed by Dr. Bethanie Dicker for CHF, lung mass, COPD, pulmonary mycosis with plan for 3 mo f/u with CT by 03/2017 note.   Alcohol abuse    Anemia    Arthritis    "right hip; lower back" (05/11/2017)   Chronic systolic CHF (congestive heart failure) (HCC)    a. EF 20-25% by echo 2017. b. 40-45% by echo in 01/2017.   COPD (chronic obstructive pulmonary disease) (HCC)    Dyspnea    GERD (gastroesophageal reflux disease)    History of blood transfusion 2004   "related to bleeding stomach ulcerts"   History of stomach ulcers 2004   Hypercholesterolemia    Hypertension    Malnutrition (HCC)    Moderate mitral regurgitation    Myocardial infarction [facility]) 2004   "think it was bleeding ulcers; not a heart attack" (05/11/2017), pt denies heart cath   Peripheral vascular disease (HCC)    Pre-diabetes    Pulmonary hypertension (HCC)    a. severe pulm HTN by echo 01/2017.    Tricuspid regurgitation 2017    Patient Active Problem List   Diagnosis Date Noted   Hepatic cirrhosis (HCC) 06/20/2017   Ileus  06/19/2017   Abdominal pain, acute, left lower quadrant 06/12/2017   Respiratory failure with hypoxia (HCC) 06/12/2017   Acute left lower quadrant pain 06/12/2017   Constipation 06/12/2017   PAH (pulmonary artery hypertension) (HCC)    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   Malnutrition of moderate degree 05/12/2017   COPD with acute exacerbation (HCC) 05/11/2017   Fever 05/11/2017   ETOH abuse 05/11/2017   Anemia    COPD (chronic obstructive pulmonary disease) (HCC)    Hypertension    Critical lower limb ischemia (HCC) 04/01/2017   Peripheral vascular disease (HCC) 03/22/2017   Simple chronic bronchitis (HCC) 03/22/2017   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 12/11/2016   Degenerative disc disease, lumbar 12/11/2016   Primary osteoarthritis of right hip 12/11/2016   Hypokalemia 01/19/2016   Chronic systolic heart failure (HCC) 01/18/2016   Acute respiratory failure with hypoxia (HCC) 01/18/2016   Bullous lesion 01/18/2016   Drinks beer 01/18/2016   Hyperlipidemia 01/18/2016   Hyponatremia 01/18/2016   Tobacco abuse 01/18/2016    Past Surgical History:  Procedure Laterality Date   ABDOMINAL AORTOGRAM W/LOWER EXTREMITY N/A 04/01/2017   Procedure: ABDOMINAL AORTOGRAM W/LOWER EXTREMITY;  Surgeon: Leonides Sake  L, MD;  Location: [facility] INVASIVE CV LAB;  Service: Cardiovascular;  Laterality: N/A;  Bilateral   AORTA - BILATERAL FEMORAL ARTERY BYPASS GRAFT N/A 06/01/2017   Procedure: AORTA BIFEMORAL BYPASS GRAFT with Graft and Right Femoral Endartarectomy;  Surgeon: Fransisco Hertz, MD;  Location: [facility] OR;  Service: Vascular;  Laterality: N/A;   COLONOSCOPY     HEMORRHOIDECTOMY WITH HEMORRHOID BANDING     MASS EXCISION Left 04/13/2017   Procedure: EXCISION EPIDERMAL INCLUSION CYST LEFT GROIN;  Surgeon: Fransisco Hertz, MD;  Location: [facility] OR;  Service: Vascular;   Laterality: Left;   PERIPHERAL VASCULAR INTERVENTION Left    "groin; for clogged artery"    Allergies Penicillins  Family History  Problem Relation Age of Onset   Hypertension Sister    Hyperlipidemia Sister    Diabetes Sister    Peripheral Artery Disease Father     Social History Social History   Tobacco Use   Smoking status: Every Day    Packs/day: 0.25    Years: 50.00    Pack years: 12.50    Types: Cigarettes   Smokeless tobacco: Never  Vaping Use   Vaping Use: Never used  Substance Use Topics   Alcohol use: Yes    Alcohol/week: 8.0 standard drinks    Types: 8 Cans of beer per week    Comment: occ   Drug use: No    Review of Systems  Constitutional: No fever/chills Eyes: No visual changes. ENT: No sore throat. Cardiovascular: Denies chest pain. Respiratory: Denies shortness of breath. Gastrointestinal: No abdominal pain.  No nausea, no vomiting.  No diarrhea.  No constipation. Genitourinary: Negative for dysuria. Musculoskeletal: Negative for back pain. Positive left elbow pain.  Skin: Negative for rash. Neurological: Negative for headaches, focal weakness or  10-point ROS otherwise negative.  ____________________________________________   PHYSICAL EXAM:  VITAL SIGNS: ED Triage Vitals  Enc Vitals Group     BP 08/22/20 1440 133/75     Pulse Rate 08/22/20 1440 (!) 102     Resp 08/22/20 1440 (!) 22     Temp 08/22/20 1440 98.5 F (36.9 C)     Temp Source 08/22/20 1440 Oral     SpO2 08/22/20 1440 (!) 86 %   Constitutional: Alert and oriented. Well appearing and in no acute distress. Eyes: Conjunctivae are normal.  Head: Atraumatic. Nose: No congestion/rhinnorhea. Mouth/Throat: Mucous membranes are moist.   Neck: No stridor.   Cardiovascular: Tachycardia. Good peripheral circulation. Grossly normal heart sounds.   Respiratory: Normal respiratory effort.  No retractions. Lungs CTAB. Gastrointestinal: Soft and nontender. No distention.   Musculoskeletal: No lower extremity tenderness nor edema.  There is a left elbow effusion but no erythema or warmth.  No tenderness over the wrist or shoulder. ROM of the left elbow reduced.  Neurologic:  Normal speech and language. Normal strength and sensation of the bilateral upper and lower extremities. Skin:  Skin is warm, dry and intact. No rash noted.   ____________________________________________   LABS (all labs ordered are listed, but only abnormal results are displayed)  Labs Reviewed  COMPREHENSIVE METABOLIC PANEL - Abnormal; Notable for the following components:      Result Value   Sodium 125 (*)    Chloride 93 (*)    AST 43 (*)    Total Bilirubin 1.6 (*)    All other components within normal limits  CBC WITH DIFFERENTIAL/PLATELET - Abnormal; Notable for the following components:   Hemoglobin 17.1 (*)    Monocytes Absolute  1.1 (*)    All other components within normal limits  BRAIN NATRIURETIC PEPTIDE - Abnormal; Notable for the following components:   B Natriuretic Peptide 387.3 (*)    All other components within normal limits  RESP PANEL BY RT-PCR (FLU A&B, COVID) [id number]   ____________________________________________  EKG   EKG Interpretation  Date/Time:  Thursday August 22 2020 15:07:41 EDT Ventricular Rate:  92 PR Interval:  118 QRS Duration: 97 QT Interval:  378 QTC Calculation: 468 R Axis:   93 Text Interpretation: Sinus rhythm Atrial premature complexes in couplets Borderline short PR interval Right axis deviation Abnormal R-wave progression, late transition Borderline repolarization abnormality T wave changes new from 2019 Confirmed by Alona Bene [phone]) on 08/22/2020 3:20:21 PM         ____________________________________________  RADIOLOGY  Left elbow and CXR reviewed.   ____________________________________________   PROCEDURES  Procedure(s) performed:   Procedures  None  ____________________________________________   INITIAL  IMPRESSION / ASSESSMENT AND PLAN / ED COURSE  Pertinent labs & imaging results that were available during my care of the patient were reviewed by me and considered in my medical decision making (see chart for details).   Patient presents to the emergency department with left elbow pain.  Suspect inflammatory arthritis as the cause.  He has had intermittent swelling in this joint in the past responding to anti-inflammatory medications.  Clinically, the joint does not appear septic.  Plan for plain films to rule out fracture.  On arrival, patient is mildly hypoxemic but with no increased respiratory work or difficulty.  I do not appreciate severe wheezing.  This may be baseline for the patient as he is relatively asymptomatic but do plan for chest x-ray, COVID swab, labs.   05:00 PM  Patient's lab work in terms of the joint effusion is reassuring.  There is no leukocytosis.  Patient remains afebrile.  X-ray shows effusion but no obvious bony abnormality.  Doubt occult fracture given lack of trauma leading to symptoms.  He has had inflammatory arthritis in the past which has improved with NSAIDs.  In terms of his joint effusion I plan to treat with NSAIDs over the next 1 to 2 weeks.   In terms of the patient's other labs his BNP is slightly elevated and his sodium is low.  Glucose is normal.  He denies any significant fatigue.  He is not feeling especially short of breath.  We discussed that he is requiring oxygen shortly after arrival here but states that is baseline for him and that he is following with a pulmonologist.  I discussed staying in the hospital for diuresis, breathing treatments, correction of his sodium but he is very resistant to this.  He states that he feels fine and would like to be discharged.  He plans to follow with his pulmonary and primary care doctors in the coming week.  I took him off oxygen his oxygen saturations are staying greater than 90% at rest. Will continue his lasix.  Discussed fluid restriction plan at home for hyponatremia and PCP call first thing in the AM for repeat labs.  ____________________________________________  FINAL CLINICAL IMPRESSION(S) / ED DIAGNOSES  Final diagnoses:  Left elbow pain  Hyponatremia     MEDICATIONS GIVEN DURING THIS VISIT:  Medications  oxyCODONE-acetaminophen (PERCOCET/ROXICET) 5-325 MG per tablet 1 tablet (1 tablet Oral Given 08/22/20 1600)  ketorolac (TORADOL) 30 MG/ML injection 15 mg (15 mg Intravenous Given 08/22/20 1740)     NEW OUTPATIENT MEDICATIONS STARTED DURING  THIS VISIT:  Discharge Medication List as of 08/22/2020  5:28 PM     START taking these medications   Details  naproxen (NAPROSYN) 500 MG tablet Take 1 tablet (500 mg total) by mouth 2 (two) times daily for 14 days., Starting Thu 08/22/2020, Until Thu 09/05/2020, Normal        Note:  This document was prepared using Dragon voice recognition software and may include unintentional dictation errors.  Alona Bene, MD, [facility]    Ezrael Sam, Arlyss Repress, MD 08/24/20 1059

## 2020-08-22 NOTE — Discharge Instructions (Addendum)
Seen in the emergency room today with left elbow pain and swelling.  This is not showing sign of infection and I am starting an anti-inflammatory medication for you at home.  Your sodium was low today at 125.  Would like for you to cut back on the amount of water you are drinking by about 1/2 your normal amount for the next 5 days.  Please call your primary care doctor for repeat blood work in that time.  If you begin to feel very weak, short of breath, develop fever, or develop redness/warmth in the left elbow you should return to the emergency department.

## 2020-08-22 NOTE — ED Notes (Signed)
Pt states his left elbow has been hurting x 2 days.  Extreme today.  Painful on palpation + swelling.  sats  85% on room air.  RT at bedside

## 2020-09-03 ENCOUNTER — Ambulatory Visit (INDEPENDENT_AMBULATORY_CARE_PROVIDER_SITE_OTHER): Payer: Medicare Other | Admitting: Family Medicine

## 2020-09-03 ENCOUNTER — Encounter: Payer: Self-pay | Admitting: Family Medicine

## 2020-09-03 ENCOUNTER — Ambulatory Visit: Payer: Self-pay

## 2020-09-03 ENCOUNTER — Other Ambulatory Visit: Payer: Self-pay

## 2020-09-03 VITALS — BP 120/72 | Ht 63.0 in | Wt 100.0 lb

## 2020-09-03 DIAGNOSIS — M25422 Effusion, left elbow: Secondary | ICD-10-CM | POA: Diagnosis not present

## 2020-09-03 NOTE — Patient Instructions (Signed)
Nice to meet you Please try ice as needed   Please send me a message in MyChart with any questions or updates.  Please see me back as needed.   --Dr. Jordan Likes

## 2020-09-03 NOTE — Progress Notes (Signed)
Wayne [name] - 65 y.o. male MRN [id number]  Date of birth: 25-Jan-1956  SUBJECTIVE:  Including CC & ROS.  No chief complaint on file.   Wayne [name] is a 65 y.o. male that is presenting with left elbow pain.  He had severe pain when he was seen in the emergency department.  This happens every year or so and gets improvement with medication.  No injury or trauma..  Independent review of the left elbow x-ray from 6/16 shows posterior elbow effusion   Review of Systems See HPI   HISTORY: Past Medical, Surgical, Social, and Family History Reviewed & Updated per EMR.   Pertinent Historical Findings include:  Past Medical History:  Diagnosis Date   Abnormal pulmonary finding    a. preadmission note indicates pt followed by Dr. Bethanie Dicker for CHF, lung mass, COPD, pulmonary mycosis with plan for 3 mo f/u with CT by 03/2017 note.   Alcohol abuse    Anemia    Arthritis    "right hip; lower back" (05/11/2017)   Chronic systolic CHF (congestive heart failure) (HCC)    a. EF 20-25% by echo 2017. b. 40-45% by echo in 01/2017.   COPD (chronic obstructive pulmonary disease) (HCC)    Dyspnea    GERD (gastroesophageal reflux disease)    History of blood transfusion 2004   "related to bleeding stomach ulcerts"   History of stomach ulcers 2004   Hypercholesterolemia    Hypertension    Malnutrition (HCC)    Moderate mitral regurgitation    Myocardial infarction [facility]) 2004   "think it was bleeding ulcers; not a heart attack" (05/11/2017), pt denies heart cath   Peripheral vascular disease (HCC)    Pre-diabetes    Pulmonary hypertension (HCC)    a. severe pulm HTN by echo 01/2017.   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 N/A 06/01/2017   Procedure: AORTA BIFEMORAL BYPASS GRAFT with Graft and Right Femoral Endartarectomy;  Surgeon: Fransisco Hertz, MD;  Location: [facility] OR;  Service: Vascular;  Laterality: N/A;   COLONOSCOPY     HEMORRHOIDECTOMY WITH HEMORRHOID BANDING     MASS EXCISION Left 04/13/2017   Procedure: EXCISION EPIDERMAL INCLUSION CYST LEFT GROIN;  Surgeon: Fransisco Hertz, MD;  Location: [facility] OR;  Service: Vascular;  Laterality: Left;   PERIPHERAL VASCULAR INTERVENTION Left    "groin; for clogged artery"    Family History  Problem Relation Age of Onset   Hypertension Sister    Hyperlipidemia Sister    Diabetes Sister    Peripheral Artery Disease Father     Social History   Socioeconomic History   Marital status: Married    Spouse name: Not on file   Number of children: Not on file   Years of education: Not on file   Highest education level: Not on file  Occupational History   Not on file  Tobacco Use   Smoking status: Every Day    Packs/day: 0.25    Years: 50.00    Pack years: 12.50    Types: Cigarettes   Smokeless tobacco: Never  Vaping Use   Vaping Use: Never used  Substance and Sexual Activity   Alcohol use: Yes  Alcohol/week: 8.0 standard drinks    Types: 8 Cans of beer per week    Comment: occ   Drug use: No   Sexual activity: Not on file  Other Topics Concern   Not on file  Social History Narrative   Not on file   Social Determinants of Health   Financial Resource Strain: Not on file  Food Insecurity: Not on file  Transportation Needs: Not on file  Physical Activity: Not on file  Stress: Not on file  Social Connections: Not on file  Intimate Partner Violence: Not on file     PHYSICAL EXAM:  VS: BP 120/72 (BP Location: Right Arm, Patient Position: Sitting, Cuff Size: Normal)   Ht 5\' 3"  (1.6 m)   Wt 100 lb (45.4 kg)   BMI 17.71 kg/m  Physical Exam Gen: NAD, alert, cooperative with exam, well-appearing MSK:  Left  elbow: No redness or swelling. Normal range of motion. Normal pronation supination. Neurovascularly intact  Limited ultrasound: Left elbow:  Mild effusion the posterior elbow joint.  There are hyperechoic entities which could suggest a gouty origin. Double-lumen line at the proximal radial head. No hyperemia.  Summary: Mild effusion and possible gouty origin  Ultrasound and interpretation by , MD    ASSESSMENT & PLAN:   Elbow joint effusion, left Mild effusion still present on exam.  Possible for more of an inflammatory origin as opposed to degenerative given his improvement with medications and his imaging. -Counseled on home exercise therapy and supportive care. -Counseled on naproxen. -Could consider lab work or injection.

## 2020-09-04 DIAGNOSIS — M25422 Effusion, left elbow: Secondary | ICD-10-CM | POA: Insufficient documentation

## 2020-09-04 NOTE — Assessment & Plan Note (Signed)
Mild effusion still present on exam.  Possible for more of an inflammatory origin as opposed to degenerative given his improvement with medications and his imaging. -Counseled on home exercise therapy and supportive care. -Counseled on naproxen. -Could consider lab work or injection.

## 2021-02-27 ENCOUNTER — Emergency Department (HOSPITAL_BASED_OUTPATIENT_CLINIC_OR_DEPARTMENT_OTHER): Payer: Medicare Other

## 2021-02-27 ENCOUNTER — Encounter (HOSPITAL_BASED_OUTPATIENT_CLINIC_OR_DEPARTMENT_OTHER): Payer: Self-pay | Admitting: *Deleted

## 2021-02-27 ENCOUNTER — Encounter (HOSPITAL_COMMUNITY): Admission: EM | Disposition: E | Payer: Self-pay | Source: Home / Self Care | Attending: Vascular Surgery

## 2021-02-27 ENCOUNTER — Emergency Department (HOSPITAL_COMMUNITY): Payer: Medicare Other | Admitting: Anesthesiology

## 2021-02-27 ENCOUNTER — Inpatient Hospital Stay (HOSPITAL_BASED_OUTPATIENT_CLINIC_OR_DEPARTMENT_OTHER)
Admission: EM | Admit: 2021-02-27 | Discharge: 2021-04-09 | DRG: 252 | Disposition: E | Payer: Medicare Other | Attending: Vascular Surgery | Admitting: Vascular Surgery

## 2021-02-27 ENCOUNTER — Other Ambulatory Visit: Payer: Self-pay

## 2021-02-27 DIAGNOSIS — I428 Other cardiomyopathies: Secondary | ICD-10-CM | POA: Diagnosis not present

## 2021-02-27 DIAGNOSIS — R0603 Acute respiratory distress: Secondary | ICD-10-CM

## 2021-02-27 DIAGNOSIS — Y838 Other surgical procedures as the cause of abnormal reaction of the patient, or of later complication, without mention of misadventure at the time of the procedure: Secondary | ICD-10-CM | POA: Diagnosis not present

## 2021-02-27 DIAGNOSIS — E869 Volume depletion, unspecified: Secondary | ICD-10-CM | POA: Diagnosis not present

## 2021-02-27 DIAGNOSIS — E872 Acidosis, unspecified: Secondary | ICD-10-CM | POA: Diagnosis present

## 2021-02-27 DIAGNOSIS — I252 Old myocardial infarction: Secondary | ICD-10-CM

## 2021-02-27 DIAGNOSIS — I272 Pulmonary hypertension, unspecified: Secondary | ICD-10-CM | POA: Diagnosis present

## 2021-02-27 DIAGNOSIS — K55029 Acute infarction of small intestine, extent unspecified: Secondary | ICD-10-CM

## 2021-02-27 DIAGNOSIS — F17210 Nicotine dependence, cigarettes, uncomplicated: Secondary | ICD-10-CM | POA: Diagnosis present

## 2021-02-27 DIAGNOSIS — T8579XA Infection and inflammatory reaction due to other internal prosthetic devices, implants and grafts, initial encounter: Secondary | ICD-10-CM | POA: Diagnosis present

## 2021-02-27 DIAGNOSIS — R0902 Hypoxemia: Secondary | ICD-10-CM | POA: Diagnosis not present

## 2021-02-27 DIAGNOSIS — E43 Unspecified severe protein-calorie malnutrition: Secondary | ICD-10-CM | POA: Insufficient documentation

## 2021-02-27 DIAGNOSIS — A419 Sepsis, unspecified organism: Secondary | ICD-10-CM | POA: Diagnosis not present

## 2021-02-27 DIAGNOSIS — I7090 Unspecified atherosclerosis: Secondary | ICD-10-CM | POA: Diagnosis present

## 2021-02-27 DIAGNOSIS — Z4659 Encounter for fitting and adjustment of other gastrointestinal appliance and device: Secondary | ICD-10-CM

## 2021-02-27 DIAGNOSIS — J9601 Acute respiratory failure with hypoxia: Secondary | ICD-10-CM | POA: Diagnosis present

## 2021-02-27 DIAGNOSIS — Z8711 Personal history of peptic ulcer disease: Secondary | ICD-10-CM

## 2021-02-27 DIAGNOSIS — K567 Ileus, unspecified: Secondary | ICD-10-CM | POA: Diagnosis not present

## 2021-02-27 DIAGNOSIS — Z681 Body mass index (BMI) 19 or less, adult: Secondary | ICD-10-CM

## 2021-02-27 DIAGNOSIS — R188 Other ascites: Secondary | ICD-10-CM | POA: Diagnosis present

## 2021-02-27 DIAGNOSIS — R109 Unspecified abdominal pain: Secondary | ICD-10-CM

## 2021-02-27 DIAGNOSIS — I743 Embolism and thrombosis of arteries of the lower extremities: Secondary | ICD-10-CM | POA: Diagnosis not present

## 2021-02-27 DIAGNOSIS — Z66 Do not resuscitate: Secondary | ICD-10-CM | POA: Diagnosis not present

## 2021-02-27 DIAGNOSIS — R200 Anesthesia of skin: Secondary | ICD-10-CM

## 2021-02-27 DIAGNOSIS — Z7982 Long term (current) use of aspirin: Secondary | ICD-10-CM | POA: Diagnosis not present

## 2021-02-27 DIAGNOSIS — I081 Rheumatic disorders of both mitral and tricuspid valves: Secondary | ICD-10-CM | POA: Diagnosis present

## 2021-02-27 DIAGNOSIS — D500 Iron deficiency anemia secondary to blood loss (chronic): Secondary | ICD-10-CM | POA: Diagnosis not present

## 2021-02-27 DIAGNOSIS — J449 Chronic obstructive pulmonary disease, unspecified: Secondary | ICD-10-CM | POA: Diagnosis present

## 2021-02-27 DIAGNOSIS — E7800 Pure hypercholesterolemia, unspecified: Secondary | ICD-10-CM | POA: Diagnosis present

## 2021-02-27 DIAGNOSIS — Z515 Encounter for palliative care: Secondary | ICD-10-CM

## 2021-02-27 DIAGNOSIS — R069 Unspecified abnormalities of breathing: Secondary | ICD-10-CM | POA: Diagnosis not present

## 2021-02-27 DIAGNOSIS — M5136 Other intervertebral disc degeneration, lumbar region: Secondary | ICD-10-CM | POA: Diagnosis present

## 2021-02-27 DIAGNOSIS — R000 Tachycardia, unspecified: Secondary | ICD-10-CM | POA: Diagnosis not present

## 2021-02-27 DIAGNOSIS — I110 Hypertensive heart disease with heart failure: Secondary | ICD-10-CM | POA: Diagnosis present

## 2021-02-27 DIAGNOSIS — S36113A Laceration of liver, unspecified degree, initial encounter: Secondary | ICD-10-CM | POA: Diagnosis not present

## 2021-02-27 DIAGNOSIS — K9189 Other postprocedural complications and disorders of digestive system: Secondary | ICD-10-CM | POA: Diagnosis not present

## 2021-02-27 DIAGNOSIS — M79A3 Nontraumatic compartment syndrome of abdomen: Secondary | ICD-10-CM | POA: Diagnosis present

## 2021-02-27 DIAGNOSIS — R579 Shock, unspecified: Secondary | ICD-10-CM | POA: Diagnosis not present

## 2021-02-27 DIAGNOSIS — R935 Abnormal findings on diagnostic imaging of other abdominal regions, including retroperitoneum: Secondary | ICD-10-CM | POA: Diagnosis not present

## 2021-02-27 DIAGNOSIS — D62 Acute posthemorrhagic anemia: Secondary | ICD-10-CM | POA: Diagnosis not present

## 2021-02-27 DIAGNOSIS — Z79899 Other long term (current) drug therapy: Secondary | ICD-10-CM | POA: Diagnosis not present

## 2021-02-27 DIAGNOSIS — Z880 Allergy status to penicillin: Secondary | ICD-10-CM

## 2021-02-27 DIAGNOSIS — K219 Gastro-esophageal reflux disease without esophagitis: Secondary | ICD-10-CM | POA: Diagnosis present

## 2021-02-27 DIAGNOSIS — I998 Other disorder of circulatory system: Secondary | ICD-10-CM | POA: Diagnosis not present

## 2021-02-27 DIAGNOSIS — R34 Anuria and oliguria: Secondary | ICD-10-CM | POA: Diagnosis not present

## 2021-02-27 DIAGNOSIS — M79604 Pain in right leg: Secondary | ICD-10-CM

## 2021-02-27 DIAGNOSIS — K562 Volvulus: Secondary | ICD-10-CM | POA: Diagnosis not present

## 2021-02-27 DIAGNOSIS — K559 Vascular disorder of intestine, unspecified: Secondary | ICD-10-CM | POA: Diagnosis not present

## 2021-02-27 DIAGNOSIS — K5901 Slow transit constipation: Secondary | ICD-10-CM | POA: Diagnosis not present

## 2021-02-27 DIAGNOSIS — I959 Hypotension, unspecified: Secondary | ICD-10-CM | POA: Diagnosis not present

## 2021-02-27 DIAGNOSIS — Z452 Encounter for adjustment and management of vascular access device: Secondary | ICD-10-CM

## 2021-02-27 DIAGNOSIS — I70229 Atherosclerosis of native arteries of extremities with rest pain, unspecified extremity: Secondary | ICD-10-CM | POA: Diagnosis present

## 2021-02-27 DIAGNOSIS — K660 Peritoneal adhesions (postprocedural) (postinfection): Secondary | ICD-10-CM | POA: Diagnosis present

## 2021-02-27 DIAGNOSIS — M1990 Unspecified osteoarthritis, unspecified site: Secondary | ICD-10-CM | POA: Diagnosis present

## 2021-02-27 DIAGNOSIS — R58 Hemorrhage, not elsewhere classified: Secondary | ICD-10-CM | POA: Diagnosis not present

## 2021-02-27 DIAGNOSIS — M549 Dorsalgia, unspecified: Secondary | ICD-10-CM

## 2021-02-27 DIAGNOSIS — R1084 Generalized abdominal pain: Secondary | ICD-10-CM | POA: Diagnosis not present

## 2021-02-27 DIAGNOSIS — T827XXA Infection and inflammatory reaction due to other cardiac and vascular devices, implants and grafts, initial encounter: Secondary | ICD-10-CM | POA: Diagnosis not present

## 2021-02-27 DIAGNOSIS — Y92234 Operating room of hospital as the place of occurrence of the external cause: Secondary | ICD-10-CM | POA: Diagnosis not present

## 2021-02-27 DIAGNOSIS — Z20822 Contact with and (suspected) exposure to covid-19: Secondary | ICD-10-CM | POA: Diagnosis not present

## 2021-02-27 DIAGNOSIS — Z7189 Other specified counseling: Secondary | ICD-10-CM | POA: Diagnosis not present

## 2021-02-27 DIAGNOSIS — R64 Cachexia: Secondary | ICD-10-CM | POA: Diagnosis present

## 2021-02-27 DIAGNOSIS — E781 Pure hyperglyceridemia: Secondary | ICD-10-CM | POA: Diagnosis not present

## 2021-02-27 DIAGNOSIS — I82401 Acute embolism and thrombosis of unspecified deep veins of right lower extremity: Secondary | ICD-10-CM | POA: Diagnosis present

## 2021-02-27 DIAGNOSIS — I724 Aneurysm of artery of lower extremity: Principal | ICD-10-CM | POA: Diagnosis present

## 2021-02-27 DIAGNOSIS — Z8249 Family history of ischemic heart disease and other diseases of the circulatory system: Secondary | ICD-10-CM

## 2021-02-27 DIAGNOSIS — R6521 Severe sepsis with septic shock: Secondary | ICD-10-CM | POA: Diagnosis not present

## 2021-02-27 DIAGNOSIS — Z7951 Long term (current) use of inhaled steroids: Secondary | ICD-10-CM

## 2021-02-27 DIAGNOSIS — R140 Abdominal distension (gaseous): Secondary | ICD-10-CM | POA: Diagnosis not present

## 2021-02-27 DIAGNOSIS — I5022 Chronic systolic (congestive) heart failure: Secondary | ICD-10-CM | POA: Diagnosis present

## 2021-02-27 DIAGNOSIS — R627 Adult failure to thrive: Secondary | ICD-10-CM | POA: Diagnosis present

## 2021-02-27 DIAGNOSIS — R1031 Right lower quadrant pain: Secondary | ICD-10-CM

## 2021-02-27 DIAGNOSIS — R54 Age-related physical debility: Secondary | ICD-10-CM | POA: Diagnosis present

## 2021-02-27 DIAGNOSIS — T82898A Other specified complication of vascular prosthetic devices, implants and grafts, initial encounter: Secondary | ICD-10-CM | POA: Diagnosis not present

## 2021-02-27 HISTORY — PX: FEMORAL-POPLITEAL BYPASS GRAFT: SHX937

## 2021-02-27 HISTORY — PX: AORTA - BILATERAL FEMORAL ARTERY BYPASS GRAFT: SHX1175

## 2021-02-27 HISTORY — PX: APPLICATION OF WOUND VAC: SHX5189

## 2021-02-27 HISTORY — PX: MUSCLE FLAP CLOSURE: SHX2054

## 2021-02-27 LAB — CBC WITH DIFFERENTIAL/PLATELET
Abs Immature Granulocytes: 0.03 10*3/uL (ref 0.00–0.07)
Basophils Absolute: 0.1 10*3/uL (ref 0.0–0.1)
Basophils Relative: 1 %
Eosinophils Absolute: 0 10*3/uL (ref 0.0–0.5)
Eosinophils Relative: 0 %
HCT: 47.9 % (ref 39.0–52.0)
Hemoglobin: 16.6 g/dL (ref 13.0–17.0)
Immature Granulocytes: 0 %
Lymphocytes Relative: 14 %
Lymphs Abs: 1.2 10*3/uL (ref 0.7–4.0)
MCH: 34.3 pg — ABNORMAL HIGH (ref 26.0–34.0)
MCHC: 34.7 g/dL (ref 30.0–36.0)
MCV: 99 fL (ref 80.0–100.0)
Monocytes Absolute: 0.8 10*3/uL (ref 0.1–1.0)
Monocytes Relative: 9 %
Neutro Abs: 6.3 10*3/uL (ref 1.7–7.7)
Neutrophils Relative %: 76 %
Platelets: 151 10*3/uL (ref 150–400)
RBC: 4.84 MIL/uL (ref 4.22–5.81)
RDW: 14.7 % (ref 11.5–15.5)
WBC: 8.4 10*3/uL (ref 4.0–10.5)
nRBC: 0 % (ref 0.0–0.2)

## 2021-02-27 LAB — PREPARE RBC (CROSSMATCH)

## 2021-02-27 LAB — LACTIC ACID, PLASMA
Lactic Acid, Venous: 1.6 mmol/L (ref 0.5–1.9)
Lactic Acid, Venous: 1.1 mmol/L (ref 0.5–1.9)

## 2021-02-27 LAB — COMPREHENSIVE METABOLIC PANEL
ALT: 18 U/L (ref 0–44)
AST: 28 U/L (ref 15–41)
Albumin: 3.7 g/dL (ref 3.5–5.0)
Alkaline Phosphatase: 74 U/L (ref 38–126)
Anion gap: 11 (ref 5–15)
BUN: 9 mg/dL (ref 8–23)
CO2: 21 mmol/L — ABNORMAL LOW (ref 22–32)
Calcium: 9.1 mg/dL (ref 8.9–10.3)
Chloride: 103 mmol/L (ref 98–111)
Creatinine, Ser: 1.08 mg/dL (ref 0.61–1.24)
GFR, Estimated: >60 mL/min (ref >60)
Glucose, Bld: 116 mg/dL — ABNORMAL HIGH (ref 70–99)
Potassium: 3.9 mmol/L (ref 3.5–5.1)
Sodium: 135 mmol/L (ref 135–145)
Total Bilirubin: 0.6 mg/dL (ref 0.3–1.2)
Total Protein: 7.4 g/dL (ref 6.5–8.1)

## 2021-02-27 LAB — RESP PANEL BY RT-PCR (FLU A&B, COVID) ARPGX2
Influenza A by PCR: NEGATIVE
Influenza B by PCR: NEGATIVE
SARS Coronavirus 2 by RT PCR: NEGATIVE

## 2021-02-27 LAB — PROTIME-INR
INR: 1.1 (ref 0.8–1.2)
Prothrombin Time: 14.5 seconds (ref 11.4–15.2)

## 2021-02-27 LAB — TROPONIN I (HIGH SENSITIVITY)
Troponin I (High Sensitivity): 49 ng/L — ABNORMAL HIGH (ref <18)
Troponin I (High Sensitivity): 44 ng/L — ABNORMAL HIGH (ref <18)

## 2021-02-27 LAB — BRAIN NATRIURETIC PEPTIDE: B Natriuretic Peptide: 540.6 pg/mL — ABNORMAL HIGH (ref 0.0–100.0)

## 2021-02-27 SURGERY — BYPASS GRAFT FEMORAL-POPLITEAL ARTERY
Anesthesia: General | Site: Groin | Laterality: Right

## 2021-02-27 MED ORDER — LIDOCAINE HCL (CARDIAC) PF 100 MG/5ML IV SOSY
PREFILLED_SYRINGE | INTRAVENOUS | Status: DC | PRN
  Administered 2021-02-27: 40 mg via INTRATRACHEAL

## 2021-02-27 MED ORDER — LACTATED RINGERS IV SOLN: INTRAVENOUS | Status: DC | PRN

## 2021-02-27 MED ORDER — HEPARIN BOLUS VIA INFUSION
3000.0000 [IU] | Freq: Once | INTRAVENOUS | Status: AC
  Administered 2021-02-27: 20:00:00 3000 [IU] via INTRAVENOUS

## 2021-02-27 MED ORDER — HEPARIN (PORCINE) 25000 UT/250ML-% IV SOLN
800.0000 [IU]/h | INTRAVENOUS | Status: DC
  Administered 2021-02-27: 20:00:00 800 [IU]/h via INTRAVENOUS
  Filled 2021-02-27: qty 250

## 2021-02-27 MED ORDER — ROCURONIUM BROMIDE 100 MG/10ML IV SOLN
INTRAVENOUS | Status: DC | PRN
  Administered 2021-02-27: 50 mg via INTRAVENOUS
  Administered 2021-02-28: 30 mg via INTRAVENOUS

## 2021-02-27 MED ORDER — PROPOFOL 10 MG/ML IV BOLUS
INTRAVENOUS | Status: DC | PRN
  Administered 2021-02-27: 50 mg via INTRAVENOUS

## 2021-02-27 MED ORDER — HEPARIN 6000 UNIT IRRIGATION SOLUTION
Status: AC
  Filled 2021-02-27: qty 500

## 2021-02-27 MED ORDER — PHENYLEPHRINE HCL (PRESSORS) 10 MG/ML IV SOLN
INTRAVENOUS | Status: AC
  Filled 2021-02-27: qty 1

## 2021-02-27 MED ORDER — IOHEXOL 350 MG/ML SOLN
100.0000 mL | Freq: Once | INTRAVENOUS | Status: AC | PRN
  Administered 2021-02-27: 18:00:00 100 mL via INTRAVENOUS

## 2021-02-27 MED ORDER — HEPARIN SODIUM (PORCINE) 1000 UNIT/ML IJ SOLN
INTRAMUSCULAR | Status: DC | PRN
  Administered 2021-02-27: 5000 [IU] via INTRAVENOUS

## 2021-02-27 MED ORDER — MIDAZOLAM HCL 2 MG/2ML IJ SOLN
INTRAMUSCULAR | Status: AC
  Filled 2021-02-27: qty 2

## 2021-02-27 MED ORDER — SODIUM CHLORIDE 0.9% IV SOLUTION: Freq: Once | INTRAVENOUS | Status: AC

## 2021-02-27 MED ORDER — VANCOMYCIN HCL IN DEXTROSE 1-5 GM/200ML-% IV SOLN
INTRAVENOUS | Status: AC
  Filled 2021-02-27: qty 200

## 2021-02-27 MED ORDER — FENTANYL CITRATE (PF) 250 MCG/5ML IJ SOLN
INTRAMUSCULAR | Status: DC | PRN
  Administered 2021-02-27 (×3): 50 ug via INTRAVENOUS
  Administered 2021-02-28: 100 ug via INTRAVENOUS

## 2021-02-27 MED ORDER — VANCOMYCIN HCL 1000 MG IV SOLR
INTRAVENOUS | Status: DC | PRN
  Administered 2021-02-27: 23:00:00 1000 mg via INTRAVENOUS

## 2021-02-27 MED ORDER — IOHEXOL 350 MG/ML SOLN
50.0000 mL | Freq: Once | INTRAVENOUS | Status: AC | PRN
  Administered 2021-02-27: 18:00:00 50 mL via INTRAVENOUS

## 2021-02-27 MED ORDER — HEPARIN 6000 UNIT IRRIGATION SOLUTION
Status: DC | PRN
  Administered 2021-02-27: 1

## 2021-02-27 MED ORDER — PHENYLEPHRINE HCL-NACL 20-0.9 MG/250ML-% IV SOLN
INTRAVENOUS | Status: DC | PRN
  Administered 2021-02-27: 30 ug/min via INTRAVENOUS

## 2021-02-27 MED ORDER — FENTANYL CITRATE (PF) 250 MCG/5ML IJ SOLN
INTRAMUSCULAR | Status: AC
  Filled 2021-02-27: qty 5

## 2021-02-27 MED ORDER — PROPOFOL 10 MG/ML IV BOLUS
INTRAVENOUS | Status: AC
  Filled 2021-02-27: qty 20

## 2021-02-27 SURGICAL SUPPLY — 54 items
BANDAGE ESMARK 6X9 LF (GAUZE/BANDAGES/DRESSINGS) IMPLANT
BNDG ESMARK 6X9 LF (GAUZE/BANDAGES/DRESSINGS)
CANISTER SUCT 3000ML PPV (MISCELLANEOUS) ×2 IMPLANT
CANISTER WOUND CARE 500ML ATS (WOUND CARE) ×1 IMPLANT
CANNULA VESSEL 3MM 2 BLNT TIP (CANNULA) IMPLANT
CATH EMB 3FR 40CM (CATHETERS) ×1 IMPLANT
CATH EMB 4FR 40CM (CATHETERS) ×2 IMPLANT
CLIP LIGATING EXTRA MED SLVR (CLIP) ×2 IMPLANT
CLIP LIGATING EXTRA SM BLUE (MISCELLANEOUS) ×2 IMPLANT
CNTNR URN SCR LID CUP LEK RST (MISCELLANEOUS) IMPLANT
CONT SPEC 4OZ STRL OR WHT (MISCELLANEOUS) ×2
DERMABOND ADVANCED (GAUZE/BANDAGES/DRESSINGS) ×1
DERMABOND ADVANCED .7 DNX12 (GAUZE/BANDAGES/DRESSINGS) ×1 IMPLANT
DRAPE C-ARM 42X72 X-RAY (DRAPES) IMPLANT
DRAPE HALF SHEET 40X57 (DRAPES) IMPLANT
DRAPE X-RAY CASS 24X20 (DRAPES) IMPLANT
DRSG VAC ATS SM SENSATRAC (GAUZE/BANDAGES/DRESSINGS) ×1 IMPLANT
ELECT REM PT RETURN 9FT ADLT (ELECTROSURGICAL) ×2
ELECTRODE REM PT RTRN 9FT ADLT (ELECTROSURGICAL) ×1 IMPLANT
GAUZE 4X4 16PLY ~~LOC~~+RFID DBL (SPONGE) ×1 IMPLANT
GLOVE SURG ENC MOIS LTX SZ7.5 (GLOVE) ×2 IMPLANT
GOWN STRL REUS W/ TWL LRG LVL3 (GOWN DISPOSABLE) ×2 IMPLANT
GOWN STRL REUS W/ TWL XL LVL3 (GOWN DISPOSABLE) ×1 IMPLANT
GOWN STRL REUS W/TWL LRG LVL3 (GOWN DISPOSABLE) ×4
GOWN STRL REUS W/TWL XL LVL3 (GOWN DISPOSABLE) ×2
GRAFT HEMASHIELD 8MM (Vascular Products) ×2 IMPLANT
GRAFT VASC STRG 30X8KNIT (Vascular Products) IMPLANT
HEMOSTAT SNOW SURGICEL 2X4 (HEMOSTASIS) IMPLANT
INSERT FOGARTY SM (MISCELLANEOUS) ×1 IMPLANT
KIT BASIN OR (CUSTOM PROCEDURE TRAY) ×2 IMPLANT
KIT TURNOVER KIT B (KITS) ×2 IMPLANT
NS IRRIG 1000ML POUR BTL (IV SOLUTION) ×4 IMPLANT
PACK PERIPHERAL VASCULAR (CUSTOM PROCEDURE TRAY) ×2 IMPLANT
PAD ARMBOARD 7.5X6 YLW CONV (MISCELLANEOUS) ×4 IMPLANT
SPONGE T-LAP 18X18 ~~LOC~~+RFID (SPONGE) ×2 IMPLANT
STAPLER VISISTAT 35W (STAPLE) ×1 IMPLANT
STOPCOCK 4 WAY LG BORE MALE ST (IV SETS) ×2 IMPLANT
SUT ETHILON 3 0 PS 1 (SUTURE) IMPLANT
SUT MNCRL AB 4-0 PS2 18 (SUTURE) ×4 IMPLANT
SUT PROLENE 5 0 C 1 24 (SUTURE) ×4 IMPLANT
SUT PROLENE 6 0 BV (SUTURE) ×3 IMPLANT
SUT SILK 2 0 SH (SUTURE) ×2 IMPLANT
SUT SILK 3 0 (SUTURE)
SUT SILK 3-0 18XBRD TIE 12 (SUTURE) IMPLANT
SUT VIC AB 2-0 CT1 27 (SUTURE) ×6
SUT VIC AB 2-0 CT1 TAPERPNT 27 (SUTURE) ×2 IMPLANT
SUT VIC AB 3-0 SH 27 (SUTURE) ×6
SUT VIC AB 3-0 SH 27X BRD (SUTURE) ×2 IMPLANT
SWAB COLLECTION DEVICE MRSA (MISCELLANEOUS) ×1 IMPLANT
SWAB CULTURE ESWAB REG 1ML (MISCELLANEOUS) ×1 IMPLANT
TOWEL GREEN STERILE (TOWEL DISPOSABLE) ×2 IMPLANT
TRAY FOLEY MTR SLVR 16FR STAT (SET/KITS/TRAYS/PACK) ×2 IMPLANT
UNDERPAD 30X36 HEAVY ABSORB (UNDERPADS AND DIAPERS) ×2 IMPLANT
WATER STERILE IRR 1000ML POUR (IV SOLUTION) ×2 IMPLANT

## 2021-02-27 NOTE — ED Notes (Signed)
Report given to Linde Gillis, CRNA. Will take pt up to OR as soon as they are ready for him.

## 2021-02-27 NOTE — Anesthesia Preprocedure Evaluation (Addendum)
Anesthesia Evaluation  Patient identified by MRN, date of birth, ID band Patient awake    Reviewed: Allergy & Precautions, H&P , NPO status , Patient's Chart, lab work & pertinent test results  Airway Mallampati: II  TM Distance: >3 FB Neck ROM: Full    Dental no notable dental hx. (+) Edentulous Upper, Edentulous Lower, Dental Advisory Given   Pulmonary COPD,  COPD inhaler, Current SmokerPatient did not abstain from smoking.,    Pulmonary exam normal breath sounds clear to auscultation       Cardiovascular hypertension, + Peripheral Vascular Disease   Rhythm:Regular Rate:Normal     Neuro/Psych negative neurological ROS  negative psych ROS   GI/Hepatic Neg liver ROS, GERD  Medicated,  Endo/Other  negative endocrine ROS  Renal/GU negative Renal ROS  negative genitourinary   Musculoskeletal  (+) Arthritis , Osteoarthritis,    Abdominal   Peds  Hematology negative hematology ROS (+)   Anesthesia Other Findings   Reproductive/Obstetrics negative OB ROS                            Anesthesia Physical Anesthesia Plan  ASA: 3 and emergent  Anesthesia Plan: General   Post-op Pain Management:    Induction: Intravenous, Rapid sequence and Cricoid pressure planned  PONV Risk Score and Plan: 2 and Ondansetron and Dexamethasone  Airway Management Planned: Oral ETT  Additional Equipment: Arterial line  Intra-op Plan:   Post-operative Plan: Extubation in OR  Informed Consent: I have reviewed the patients History and Physical, chart, labs and discussed the procedure including the risks, benefits and alternatives for the proposed anesthesia with the patient or authorized representative who has indicated his/her understanding and acceptance.     Dental advisory given  Plan Discussed with: CRNA  Anesthesia Plan Comments:        Anesthesia Quick Evaluation

## 2021-02-27 NOTE — Anesthesia Procedure Notes (Signed)
Arterial Line Insertion Start/End12/21/2022 10:48 PM, 02/27/2021 10:51 PM Performed by: Gaynelle Adu, MD, anesthesiologist  Patient location: OR. Preanesthetic checklist: patient identified, IV checked, site marked, risks and benefits discussed, surgical consent, monitors and equipment checked, pre-op evaluation, timeout performed and anesthesia consent Lidocaine 1% used for infiltration Left, radial was placed Catheter size: 20 G Hand hygiene performed , maximum sterile barriers used  and Seldinger technique used  Attempts: 1 Procedure performed without using ultrasound guided technique. Following insertion, dressing applied and Biopatch. Post procedure assessment: normal and unchanged  Patient tolerated the procedure well with no immediate complications.

## 2021-02-27 NOTE — Progress Notes (Signed)
ANTICOAGULATION CONSULT NOTE - Initial Consult  Pharmacy Consult for heparin Indication:  Arterial occlusion of RLE / limb ischemia  Allergies  Allergen Reactions   Penicillins Itching, Rash and Other (See Comments)    PATIENT HAS HAD A PCN REACTION WITH IMMEDIATE RASH, FACIAL/TONGUE/THROAT SWELLING, SOB, OR LIGHTHEADEDNESS WITH HYPOTENSION:  #  #  #  YES  #  #  #   Has patient had a PCN reaction causing severe rash involving mucus membranes or skin necrosis: No Has patient had a PCN reaction that required hospitalization: No Has patient had a PCN reaction occurring within the last 10 years: No If all of the above answers are "NO", then may proceed with Cephalosporin use.     Patient Measurements: Height: 5\' 3"  (160 cm) Weight: 45.4 kg (100 lb 1.4 oz) IBW/kg (Calculated) : 56.9 Heparin Dosing Weight: 45 kg   Vital Signs: Temp: 98.3 F (36.8 C) (12/22 1627) Temp Source: Oral (12/22 1627) BP: 130/75 (12/22 1810) Pulse Rate: 81 (12/22 1810)  Labs: Recent Labs    2021-03-27 1647  HGB 16.6  HCT 47.9  PLT 151  LABPROT 14.5  INR 1.1  CREATININE 1.08  TROPONINIHS 49*    Estimated Creatinine Clearance: 42.6 mL/min (by C-G formula based on SCr of 1.08 mg/dL).   Medical History: Past Medical History:  Diagnosis Date   Abnormal pulmonary finding    a. preadmission note indicates pt followed by Dr. 03/01/21 for CHF, lung mass, COPD, pulmonary mycosis with plan for 3 mo f/u with CT by 03/2017 note.   Alcohol abuse    Anemia    Arthritis    "right hip; lower back" (05/11/2017)   Chronic systolic CHF (congestive heart failure) (HCC)    a. EF 20-25% by echo 2017. b. 40-45% by echo in 01/2017.   COPD (chronic obstructive pulmonary disease) (HCC)    Dyspnea    GERD (gastroesophageal reflux disease)    History of blood transfusion 2004   "related to bleeding stomach ulcerts"   History of stomach ulcers 2004   Hypercholesterolemia    Hypertension    Malnutrition (HCC)     Moderate mitral regurgitation    Myocardial infarction [facility]) 2004   "think it was bleeding ulcers; not a heart attack" (05/11/2017), pt denies heart cath   Peripheral vascular disease (HCC)    Pre-diabetes    Pulmonary hypertension (HCC)    a. severe pulm HTN by echo 01/2017.   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 Pharmacy consulted to start IV heparin. H/H and Plt wnl. SCr wnl   Goal of Therapy:  Heparin level 0.3-0.7 units/ml Monitor platelets by anticoagulation protocol: Yes   Plan:  -Heparin 3000 units IV bolus followed by heparin infusion at 800 units/hr -F/u 6 hr HL -Monitor daily HL, CBC and s/s of bleeding   77, PharmD., BCPS, BCCCP Clinical Pharmacist Please refer to [facility] for unit-specific pharmacist

## 2021-02-27 NOTE — ED Provider Notes (Signed)
°  Physical Exam  BP (!) 131/96    Pulse 91    Temp 98.3 F (36.8 C) (Oral)    Resp 20    Ht 5\' 3"  (1.6 m)    Wt 45.4 kg    SpO2 99%    BMI 17.73 kg/m   Physical Exam  ED Course/Procedures     Procedures  MDM  Patient transferred from [facility] for aortobifemoral graft occlusion with DVT in the right leg.  Patient was started on heparin.  I consulted Dr. [facility] to admit.        Randie Heinz, MD 02/10/2021 [id number]

## 2021-02-27 NOTE — ED Notes (Signed)
Patient transported to CT 

## 2021-02-27 NOTE — Anesthesia Procedure Notes (Signed)
Procedure Name: Intubation Date/Time: March 13, 2021 11:05 PM Performed by: Claudina Lick, CRNA Pre-anesthesia Checklist: Patient identified, Emergency Drugs available, Suction available and Patient being monitored Patient Re-evaluated:Patient Re-evaluated prior to induction Oxygen Delivery Method: Circle system utilized Preoxygenation: Pre-oxygenation with 100% oxygen Induction Type: IV induction Ventilation: Mask ventilation without difficulty and Oral airway inserted - appropriate to patient size Laryngoscope Size: Hyacinth Meeker and 2 Grade View: Grade I Tube type: Oral Tube size: 7.5 mm Number of attempts: 1 Airway Equipment and Method: Stylet and Oral airway Placement Confirmation: ETT inserted through vocal cords under direct vision, positive ETCO2 and breath sounds checked- equal and bilateral Secured at: 21 cm Tube secured with: Tape Dental Injury: Teeth and Oropharynx as per pre-operative assessment

## 2021-02-27 NOTE — ED Triage Notes (Signed)
Pt arrives with [facility]; transfer from [facility] ; pt has clot in right lower extremity. Pt waiting for Vascular consult.

## 2021-02-27 NOTE — ED Triage Notes (Signed)
Right leg numbness since this am. He has recurrent numbness due to a growth in his right abdomen that cannot be treated per [facility].

## 2021-02-27 NOTE — ED Provider Notes (Signed)
[facility] EMERGENCY DEPARTMENT Provider Note   CSN: [id number] Arrival date & time: 02/08/2021  1619     History No chief complaint on file.   Wayne [name] is a 65 y.o. male.  The history is provided by the patient and medical records. No language interpreter was used.  Leg Pain Location:  Leg Injury: no   Leg location:  R upper leg Pain details:    Quality:  Aching   Severity:  Moderate   Onset quality:  Sudden   Duration:  6 hours   Timing:  Constant   Progression:  Unchanged Chronicity:  Recurrent Tetanus status:  Unknown Prior injury to area:  No Relieved by:  Nothing Worsened by:  Nothing Ineffective treatments:  None tried Associated symptoms: numbness   Associated symptoms: no back pain, no fatigue, no fever, no muscle weakness and no swelling       Past Medical History:  Diagnosis Date   Abnormal pulmonary finding    a. preadmission note indicates pt followed by Dr. Gwenevere Ghazi for CHF, lung mass, COPD, pulmonary mycosis with plan for 3 mo f/u with CT by 03/2017 note.   Alcohol abuse    Anemia    Arthritis    "right hip; lower back" (05/11/2017)   Chronic systolic CHF (congestive heart failure) ([place])    a. EF 20-25% by echo 2017. b. 40-45% by echo in 01/2017.   COPD (chronic obstructive pulmonary disease) (HCC)    Dyspnea    GERD (gastroesophageal reflux disease)    History of blood transfusion 2004   "related to bleeding stomach ulcerts"   History of stomach ulcers 2004   Hypercholesterolemia    Hypertension    Malnutrition (HCC)    Moderate mitral regurgitation    Myocardial infarction [facility]) 2004   "think it was bleeding ulcers; not a heart attack" (05/11/2017), pt denies heart cath   Peripheral vascular disease ([place])    Pre-diabetes    Pulmonary hypertension ([place])    a. severe pulm HTN by echo 01/2017.   Tricuspid regurgitation 2017    Patient Active Problem List   Diagnosis Date Noted   Elbow joint effusion, left 09/04/2020    Hepatic cirrhosis ([place]) 06/20/2017   Ileus  06/19/2017   Abdominal pain, acute, left lower quadrant 06/12/2017   Respiratory failure with hypoxia ([place]) 06/12/2017   Acute left lower quadrant pain 06/12/2017   Constipation 06/12/2017   PAH (pulmonary artery hypertension) ([place])    Aortic occlusion ([place]) 06/01/2017   Malnutrition of moderate degree 05/12/2017   COPD with acute exacerbation ([place]) 05/11/2017   Fever 05/11/2017   ETOH abuse 05/11/2017   Anemia    COPD (chronic obstructive pulmonary disease) ([place])    Hypertension    Critical lower limb ischemia ([place]) 04/01/2017   Peripheral vascular disease ([place]) 03/22/2017   Simple chronic bronchitis ([place]) 03/22/2017   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 12/11/2016   Degenerative disc disease, lumbar 12/11/2016   Primary osteoarthritis of right hip 12/11/2016   Hypokalemia [id number]   Chronic systolic heart failure ([place]) 01/18/2016   Acute respiratory failure with hypoxia ([place]) 01/18/2016   Bullous lesion 01/18/2016   Drinks beer 01/18/2016   Hyperlipidemia 01/18/2016   Hyponatremia 01/18/2016   Tobacco abuse 01/18/2016    Past Surgical History:  Procedure Laterality Date   ABDOMINAL AORTOGRAM W/LOWER EXTREMITY N/A 04/01/2017   Procedure: ABDOMINAL AORTOGRAM W/LOWER EXTREMITY;  Surgeon: Conrad [place], MD;  Location: [facility];  Service: Cardiovascular;  Laterality: N/A;  Bilateral   AORTA - BILATERAL FEMORAL ARTERY BYPASS GRAFT N/A 06/01/2017   Procedure: AORTA BIFEMORAL BYPASS GRAFT with Graft and Right Femoral Endartarectomy;  Surgeon: Fransisco Hertz, MD;  Location: [facility] OR;  Service: Vascular;  Laterality: N/A;   COLONOSCOPY     HEMORRHOIDECTOMY WITH HEMORRHOID BANDING     MASS EXCISION Left 04/13/2017   Procedure: EXCISION EPIDERMAL INCLUSION CYST LEFT GROIN;  Surgeon: Fransisco Hertz, MD;  Location: [facility] OR;  Service: Vascular;  Laterality: Left;   PERIPHERAL VASCULAR INTERVENTION Left    "groin; for clogged artery"        Family History  Problem Relation Age of Onset   Hypertension Sister    Hyperlipidemia Sister    Diabetes Sister    Peripheral Artery Disease Father     Social History   Tobacco Use   Smoking status: Every Day    Packs/day: 0.25    Years: 50.00    Pack years: 12.50    Types: Cigarettes   Smokeless tobacco: Never  Vaping Use   Vaping Use: Never used  Substance Use Topics   Alcohol use: Yes    Alcohol/week: 8.0 standard drinks    Types: 8 Cans of beer per week    Comment: occ   Drug use: No    Home Medications Prior to Admission medications   Medication Sig Start Date End Date Taking? Authorizing Provider  acetaminophen (TYLENOL) 500 MG tablet Take 2 tablets (1,000 mg total) by mouth every 6 (six) hours as needed. Patient taking differently: Take 250 mg by mouth daily as needed for moderate pain or headache.  02/17/16   Roxy Horseman, PA-C  albuterol (PROVENTIL HFA;VENTOLIN HFA) 108 (90 Base) MCG/ACT inhaler Inhale 2 puffs into the lungs every 6 (six) hours as needed for wheezing or shortness of breath.    [provider]  aspirin EC 81 MG tablet Take 81 mg by mouth daily.    [provider]  atorvastatin (LIPITOR) 20 MG tablet Take 20 mg by mouth daily.    [provider]  budesonide-formoterol (SYMBICORT) 160-4.5 MCG/ACT inhaler Inhale 2 puffs into the lungs 2 (two) times daily.    [provider]  furosemide (LASIX) 20 MG tablet Take 1 tablet (20 mg total) by mouth daily. 05/15/17   Mikhail, Nita Sells, DO  omeprazole (PRILOSEC) 20 MG capsule Take 1 capsule (20 mg total) by mouth daily as needed (for indegestion). 05/14/17   Edsel Petrin, DO  oxyCODONE-acetaminophen (PERCOCET/ROXICET) 5-325 MG tablet Take 1 tablet by mouth every 4 (four) hours as needed for severe pain. 08/22/20   Long, Arlyss Repress, MD    Allergies    Penicillins  Review of Systems   Review of Systems  Constitutional:  Negative for chills, fatigue and fever.   HENT:  Negative for congestion.   Respiratory:  Positive for cough (chronic), chest tightness and shortness of breath. Negative for wheezing.   Cardiovascular:  Positive for chest pain.  Gastrointestinal:  Negative for abdominal pain, constipation, diarrhea, nausea and vomiting.  Genitourinary:  Negative for dysuria.  Musculoskeletal:  Negative for back pain.  Skin:  Positive for color change. Negative for rash and wound.  Neurological:  Negative for headaches.  Psychiatric/Behavioral:  Negative for agitation.   All other systems reviewed and are negative.  Physical Exam Updated Vital Signs BP 130/75    Pulse 81    Temp 98.3 F (36.8 C) (Oral)    Resp 16  Ht 5\' 3"  (1.6 m)    Wt 45.4 kg    SpO2 95%    BMI 17.73 kg/m   Physical Exam Vitals and nursing note reviewed.  Constitutional:      General: He is not in acute distress.    Appearance: He is well-developed. He is not ill-appearing, toxic-appearing or diaphoretic.  HENT:     Head: Normocephalic and atraumatic.  Eyes:     Conjunctiva/sclera: Conjunctivae normal.  Cardiovascular:     Rate and Rhythm: Regular rhythm. Tachycardia present.     Heart sounds: No murmur heard. Pulmonary:     Effort: Tachypnea present.     Breath sounds: Normal breath sounds. No rhonchi or rales.  Chest:     Chest wall: No tenderness.  Abdominal:     General: Abdomen is flat.     Palpations: Abdomen is soft.     Tenderness: There is no abdominal tenderness. There is no right CVA tenderness, left CVA tenderness, guarding or rebound.  Musculoskeletal:        General: Tenderness present. No swelling.     Cervical back: Neck supple. No tenderness.     Right lower leg: No edema.     Left lower leg: No edema.       Legs:     Comments: No pulses in right lower extremity at the [facility] and PT arteries.  Numbness in the right leg but no weakness appreciated.  Slightly darker in appearance.  Skin:    General: Skin is dry.     Capillary Refill: Capillary  refill takes less than 2 seconds.     Findings: No erythema.     Comments: R foot slightly darker than L  Neurological:     Mental Status: He is alert.     Sensory: Sensory deficit present.     Motor: No weakness.  Psychiatric:        Mood and Affect: Mood normal.    ED Results / Procedures / Treatments   Labs (all labs ordered are listed, but only abnormal results are displayed) Labs Reviewed  CBC WITH DIFFERENTIAL/PLATELET - Abnormal; Notable for the following components:      Result Value   MCH 34.3 (*)    All other components within normal limits  COMPREHENSIVE METABOLIC PANEL - Abnormal; Notable for the following components:   CO2 21 (*)    Glucose, Bld 116 (*)    All other components within normal limits  BRAIN NATRIURETIC PEPTIDE - Abnormal; Notable for the following components:   B Natriuretic Peptide 540.6 (*)    All other components within normal limits  TROPONIN I (HIGH SENSITIVITY) - Abnormal; Notable for the following components:   Troponin I (High Sensitivity) 49 (*)    All other components within normal limits  RESP PANEL BY RT-PCR (FLU A&B, COVID) ARPGX2  CULTURE, BLOOD (ROUTINE X 2)  CULTURE, BLOOD (ROUTINE X 2)  LACTIC ACID, PLASMA  PROTIME-INR  LACTIC ACID, PLASMA  TROPONIN I (HIGH SENSITIVITY)    EKG EKG Interpretation  Date/Time:  Thursday February 27 2021 16:34:12 EST Ventricular Rate:  101 PR Interval:  97 QRS Duration: 98 QT Interval:  363 QTC Calculation: 471 R Axis:   95 Text Interpretation: Sinus tachycardia Right axis deviation Abnormal R-wave progression, late transition Borderline repolarization abnormality When compared to prior, slightly faster rate. No STEMI Confirmed by Antony Blackbird [phone]) on 03/03/2021 4:52:51 PM  Radiology CT Angio Chest PE W and/or Wo Contrast  Result Date: 02/14/2021 CLINICAL  DATA:  Shortness of breath and hypoxemia. Clinical concern for acute pulmonary embolism. EXAM: CT ANGIOGRAPHY CHEST WITH CONTRAST  TECHNIQUE: Multidetector CT imaging of the chest was performed using the standard protocol during bolus administration of intravenous contrast. Multiplanar CT image reconstructions and MIPs were obtained to evaluate the vascular anatomy. CONTRAST:  60mL OMNIPAQUE IOHEXOL 350 MG/ML SOLN COMPARISON:  Chest CTA 04/08/2019. FINDINGS: Cardiovascular: The patient became nauseated during the injection. This results in incomplete imaging of the lower chest during the initial bolus, and suboptimal opacification of the pulmonary arteries in the lower chest during imaging. Repeat imaging was performed. There is no evidence of acute pulmonary embolism. There is diffuse aortic, great vessel and coronary artery atherosclerosis without acute vascular findings. The heart size is normal. There is no pericardial effusion. Mediastinum/Nodes: There are no enlarged mediastinal, hilar or axillary lymph nodes. The thyroid gland, trachea and esophagus demonstrate no significant findings. Lungs/Pleura: There is no pleural effusion or pneumothorax. Moderate to severe centrilobular and paraseptal emphysema again noted without evidence of acute superimposed edema or airspace disease. Upper abdomen:  Abdominal findings dictated separately. Musculoskeletal/Chest wall: There is no chest wall mass or suspicious osseous finding. Review of the MIP images confirms the above findings. IMPRESSION: 1. No evidence of acute pulmonary embolism or other acute vascular findings in the chest. Evaluation is mildly limited by discontinuous imaging caused by patient nausea. 2. Diffuse atherosclerosis of the aorta, great vessels and coronary arteries. Aortic Atherosclerosis (ICD10-I70.0). 3. Moderate to severe centrilobular and paraseptal emphysema. Emphysema (ICD10-J43.9). Electronically Signed   By: Carey Bullocks M.D.   On: 02/07/2021 18:16   CT Angio Aortobifemoral W and/or Wo Contrast  Result Date: 02/13/2021 CLINICAL DATA:  Absent pulses in the right  lower extremity with pain and numbness, initial encounter EXAM: CT ANGIOGRAPHY OF ABDOMINAL AORTA WITH ILIOFEMORAL RUNOFF TECHNIQUE: Multidetector CT imaging of the abdomen, pelvis and lower extremities was performed using the standard protocol during bolus administration of intravenous contrast. Multiplanar CT image reconstructions and MIPs were obtained to evaluate the vascular anatomy. CONTRAST:  OMNIPAQUE IOHEXOL 350 MG/ML SOLN COMPARISON:  CT of the abdomen and pelvis dated 03/13/2019 as well as CT runoff from 06/11/2017 FINDINGS: VASCULAR Aorta: Diffuse atherosclerotic calcifications are noted throughout the abdominal aorta. No aneurysmal dilatation is noted. The distal aorta is occluded and there are changes consistent within aortobifemoral bypass graft. The right limb of the graft is occluded at its origin. Celiac: Mild atherosclerotic calcifications are noted although no focal stenosis is seen. SMA: Patent without evidence of aneurysm, dissection, vasculitis or significant stenosis. Renals: Mild atherosclerotic changes are noted in the proximal renals although no focal hemodynamically significant stenosis is seen. IMA: IMA is occluded secondary to prior bypass graft. Some collateral reconstitution of the IMA branches distally is noted. RIGHT Lower Extremity Inflow: As described above the right limb of the aortobifemoral bypass graft is occluded at its origin. Given the patient's clinical history of abrupt onset of lower extremity pain this is likely an acute occlusion. Runoff: The touchdown site is aneurysmal measuring up to 3.3 cm. This is enlarged when compared with the prior exam at which time it measured 2 cm. Additionally there is a secondary subcutaneous rounded bulge identified which measures 2.5 cm which likely represents aneurysmal dilatation of the distal touchdown site or a focal pseudoaneurysm although the lack of arterial inflow and opacification with contrast limits evaluation of this  area. The superficial femoral artery is occluded and demonstrates diffuse atherosclerotic calcifications. There is reconstitution  of distal profundus femoral artery branches via multiple muscular collaterals in the upper right thigh as well as the pelvis. Collateralization from the profundus femoral artery distally to the popliteal artery is noted. The popliteal artery is diminutive but patent. Diffuse atherosclerotic calcifications and multifocal stenoses are seen. Popliteal trifurcation is patent with three-vessel runoff to the level of the right ankle. The posterior tibial and anterior tibial arteries continue into the foot but are diminutive in nature. LEFT Lower Extremity Inflow: Aortobifemoral bypass graft is noted. The left limb of the bypass graft is widely patent with a widely patent touchdown site into the distal common femoral artery and femoral bifurcation. Runoff: The runoff from the aortobifemoral bypass graft is primarily into the profundus femoral arteries as the superficial femoral artery is occluded at its origin. Multiple muscular collaterals reconstitute the junction of the superficial femoral artery and popliteal artery at the level of the adductor canal. Popliteal trifurcation is patent. Three-vessel runoff to the mid calf is noted with the anterior tibial and posterior tibial arteries continuing into the left foot. Veins: No specific venous abnormality is noted. Review of the MIP images confirms the above findings. NON-VASCULAR Lower chest: Diffuse emphysematous changes are noted throughout the lung bases. No focal infiltrate or sizable effusion is noted. Hepatobiliary: Considerable reflux into the hepatic venous system is noted. No focal mass is noted. The gallbladder is within normal limits. Pancreas: Small cystic structure is noted within the head of the pancreas which is stable from the prior exam and consistent with a benign etiology. The remainder of the pancreas is unremarkable. Spleen:  Scattered small hypodensities are noted throughout the spleen likely representing small hemangiomas. Adrenals/Urinary Tract: Adrenal glands are within normal limits. Kidneys are well visualized bilaterally within normal enhancement pattern. No renal calculi are noted. Prominence of the collecting system on the left is noted with fullness and proximal hydroureter. This extends to the level of the crossover the aortobifemoral bypass graft. The more distal left ureter is within normal limits. The bladder is well distended. Stomach/Bowel: No obstructive or inflammatory changes of the colon are noted. The appendix is not well visualized although no inflammatory changes to suggest appendicitis are seen. Small bowel shows no obstructive changes. Stomach is partially distended. Some wall thickening in the stomach is noted which may be related to a degree of gastritis. Lymphatic: No significant lymphadenopathy is noted. Reproductive: Prostate is unremarkable. Other: No abdominal wall hernia or abnormality. No abdominopelvic ascites. Musculoskeletal: Degenerative changes of lumbar spine are noted. No acute bony abnormality is seen. IMPRESSION: VASCULAR Changes consistent with aortobifemoral bypass graft with occlusion of the right limb at its origin. This occlusion continues to the level of the tuck chin down site in the right groin. Aneurysmal dilatation at the touch down site is noted with a focal outpouching of density which likely represents a pseudoaneurysm although evaluation is difficult due to lack of contrast opacification. 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 Reconstitution of the profundus femoral artery branches is seen with subsequent distal reconstitution of the popliteal artery just above the knee joint. Three-vessel runoff to the ankle is noted as described. Patent left limb of the aortobifemoral bypass graft. 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 Antegrade  flow into the profundus femoral branches is seen with reconstitution of the junction of the superficial femoral artery and popliteal artery on the left. Popliteal trifurcation is patent as described. NON-VASCULAR Scattered small hypodensities likely representing splenic hemangiomas. Prominence of the left  renal collecting system and proximal left ureter related to extrinsic compression from the left limb of the aortobifemoral bypass graft. No stone is noted. Prominent wall thickening within the stomach suggestive of gastritis. Clinical correlation is recommended. Aortic Atherosclerosis (ICD10-I70.0) and Emphysema (ICD10-J43.9). Critical Value/emergent results were called by telephone at the time of interpretation on 02/09/2021 at 6:30 pm to Dr. Marda Stalker , who verbally acknowledged these results. Electronically Signed   By: Inez Catalina M.D.   On: 03/07/2021 18:46    Procedures Procedures   CRITICAL CARE Performed by: Gwenyth Allegra Marji Kuehnel Total critical care time: 45 minutes Critical care time was exclusive of separately billable procedures and treating other patients. Critical care was necessary to treat or prevent imminent or life-threatening deterioration. Critical care was time spent personally by me on the following activities: development of treatment plan with patient and/or surrogate as well as nursing, discussions with consultants, evaluation of patient's response to treatment, examination of patient, obtaining history from patient or surrogate, ordering and performing treatments and interventions, ordering and review of laboratory studies, ordering and review of radiographic studies, pulse oximetry and re-evaluation of patient's condition.   Medications Ordered in ED Medications  iohexol (OMNIPAQUE) 350 MG/ML injection 100 mL (100 mLs Intravenous Contrast Given 03/07/2021 1759)  iohexol (OMNIPAQUE) 350 MG/ML injection 50 mL (50 mLs Intravenous Contrast Given 03/04/2021 1801)    ED  Course  I have reviewed the triage vital signs and the nursing notes.  Pertinent labs & imaging results that were available during my care of the patient were reviewed by me and considered in my medical decision making (see chart for details).    MDM Rules/Calculators/A&P                          TRACKER MULLINGS is a 65 y.o. male with a complex past medical history including previous aortic occlusion, bilateral aortofemoral bypass surgery, hepatic cirrhosis, hyperlipidemia, hypertension, COPD, pulmonary hypertension, CAD with prior MI, and CHF who presents with worsening right proximal leg pain with associated right leg numbness as well as worsening shortness of breath and chest pain.  According to patient, this morning around 11 AM he had onset of pain in his right groin going down his right leg towards his knee as well as numbness in the right leg.  He also thinks it could look a little bit darker than the left leg.  He says that he has had bulges in his right groin ever since having his aortofemoral bypass surgery several years ago.  He was told that those will remain there but he says that today they started hurting worsen and he developed the numbness and discomfort in the leg.  He also says that he developed shortness of breath with chest tightness and the left central chest discomfort that feels like a pressure today.  He describes it as moderate but describes the pain in the leg is moderate to severe.  He denies any symptoms in his left leg.  He denies any pain in his arms.  He reports no radiation of the discomfort and denies any other abdominal pain.  Denies nausea, vomiting, constipation, or diarrhea.  He does report a chronic cough that he does not think is any worse than baseline.  He reports no fevers or chills and denies any trauma.  On exam, lungs are clear and chest is nontender.  No murmur appreciated.  On arrival patient did have tachycardia, tachypnea, and hypoxia  with oxygen  saturations around 83% on room air.  He does not take oxygen at home.  Will place on oxygen and his oxygen improved into the 90s.  Further exam, patient does have several bulges in his right inguinal/groin area that are tender to palpation.  They are not clearly pulsatile and he reports they have been there for several years.  At this time, low suspicion for boil or abscess given the length of time he has had the bulges.  More distally, patient's right foot is slightly darker compared to left and I could not detect a DP or PT pulse on palpation or Doppler compared to the left.  Clinically I am concerned that patient has multiple problems going on.  I am concerned we need to rule out a problem with his bypass such as an arterial thrombus or abnormality related to a possible pseudoaneurysm in his right groin.  Per previous work-up, we will get a C aorto bifemoral runoff imaging to look at the arterial side of thingsT however with his hypoxia, chest pain, shortness of breath, I am also concerned he is rule out a pulmonary embolism.  As he had last time we will get both the runoff test as well as a PE study and will get a right lower extremity venous ultrasound to rule out DVT.  We also get cardiac work-up started and leave him on oxygen.  We will get likely preadmission COVID/flu testing.  Due to his new hypoxia and concern for possible leg ischemia, anticipate admission when work-up is completed.  6:48 PM I reviewed the CT scan and there appears to be concern for arterial occlusion.  I called radiology quickly who confirmed the right arm of the aortobifemoral bypass has indeed occluded in the right leg.  They suspect that the bulge is either aneurysm or pseudoaneurysm in the right groin that does appear to have enlarged his last imaging.  They do not feel it looks like abscess or infection at this time.  I called vascular surgery and spoke to Dr. Donzetta Matters who agrees the patient needs heparinization and ED to ED  transfer for likely surgical management of the limb ischemia.  Spoke with Dr. Shirlyn Goltz who accepts the patient for ED to ED transfer.  Patient will be transferred for evaluation.  Otherwise the CT PE study did not show evidence of pulmonary embolism so is unclear exactly why the patient is so tachycardic, tachypneic, and hypoxic on arrival.  Anticipate admission after vascular evaluation.  COVID and flu are negative.   Final Clinical Impression(s) / ED Diagnoses Final diagnoses:  Limb ischemia  Arterial occlusion  Right leg numbness  Right leg pain  Right groin pain     Clinical Impression: 1. Limb ischemia   2. Arterial occlusion   3. Right leg numbness   4. Right leg pain   5. Right groin pain     Disposition: Admit  This note was prepared with assistance of Dragon voice recognition software. Occasional wrong-word or sound-a-like substitutions may have occurred due to the inherent limitations of voice recognition software.     Sascha Palma, Gwenyth Allegra, MD 03/04/2021 [phone]

## 2021-02-27 NOTE — ED Notes (Signed)
SpO2 85-88% RR 28-30, placed on 2lpm [place] SpO2 92%.

## 2021-02-27 NOTE — ED Notes (Addendum)
Pt did not want to take briefs off until he got up to OR. I explained to the pt that it is protocol for ED staff to remove all clothing from pt and put pt into a gown. Pt stated, "well, last time when I got up there they allowed me to take them off then." Pt continued to refuse to remove briefs until being prepped for the OR.

## 2021-02-27 NOTE — ED Notes (Signed)
Ultrasound tech in room  

## 2021-02-27 NOTE — H&P (Addendum)
H+P    History of Present Illness This is a 65 y.o. male history of aortobifemoral bypass for claudication.  He has chronically occluded SFAs.  I have seen him in the past for concern of itching and fluid collection in the right groin but there was no evidence of any issues at that time.  He now states for about a year he has had drainage that has been pus and blood from the right groin.  Today he developed numbness in the right leg this morning.  He stated that he could not feel his foot he could not walk because of this.  Subsequently presented to [facility] emergency department.  He was started on heparin.  He now states that he can feel his foot fine.  He has not had any drainage from the right groin today.  He denies any fevers or chills.  He is on aspirin he does not take blood thinners.  Currently on oxygen with some shortness of breath on arrival today but does not wear oxygen at home.  Past Medical History:  Diagnosis Date   Abnormal pulmonary finding    a. preadmission note indicates pt followed by Dr. Bethanie Dicker for CHF, lung mass, COPD, pulmonary mycosis with plan for 3 mo f/u with CT by 03/2017 note.   Alcohol abuse    Anemia    Arthritis    "right hip; lower back" (05/11/2017)   Chronic systolic CHF (congestive heart failure) (HCC)    a. EF 20-25% by echo 2017. b. 40-45% by echo in 01/2017.   COPD (chronic obstructive pulmonary disease) (HCC)    Dyspnea    GERD (gastroesophageal reflux disease)    History of blood transfusion 2004   "related to bleeding stomach ulcerts"   History of stomach ulcers 2004   Hypercholesterolemia    Hypertension    Malnutrition (HCC)    Moderate mitral regurgitation    Myocardial infarction [facility]) 2004   "think it was bleeding ulcers; not a heart attack" (05/11/2017), pt denies heart cath   Peripheral vascular disease (HCC)    Pre-diabetes    Pulmonary hypertension (HCC)    a. severe pulm HTN by echo 01/2017.   Tricuspid  regurgitation 2017    Past Surgical History:  Procedure Laterality Date   ABDOMINAL AORTOGRAM W/LOWER EXTREMITY N/A 04/01/2017   Procedure: ABDOMINAL AORTOGRAM W/LOWER EXTREMITY;  Surgeon: Fransisco Hertz, MD;  Location: [facility] INVASIVE CV LAB;  Service: Cardiovascular;  Laterality: N/A;  Bilateral   AORTA - BILATERAL FEMORAL ARTERY BYPASS GRAFT N/A 06/01/2017   Procedure: AORTA BIFEMORAL BYPASS GRAFT with Graft and Right Femoral Endartarectomy;  Surgeon: Fransisco Hertz, MD;  Location: [facility] OR;  Service: Vascular;  Laterality: N/A;   COLONOSCOPY     HEMORRHOIDECTOMY WITH HEMORRHOID BANDING     MASS EXCISION Left 04/13/2017   Procedure: EXCISION EPIDERMAL INCLUSION CYST LEFT GROIN;  Surgeon: Fransisco Hertz, MD;  Location: [facility] OR;  Service: Vascular;  Laterality: Left;   PERIPHERAL VASCULAR INTERVENTION Left    "groin; for clogged artery"    Allergies  Allergen Reactions   Penicillins Itching, Rash and Other (See Comments)    PATIENT HAS HAD A PCN REACTION WITH IMMEDIATE RASH, FACIAL/TONGUE/THROAT SWELLING, SOB, OR LIGHTHEADEDNESS WITH HYPOTENSION:  #  #  #  YES  #  #  #   Has patient had a PCN reaction causing severe rash involving mucus membranes or skin necrosis: No Has patient had a PCN reaction that required hospitalization:  No Has patient had a PCN reaction occurring within the last 10 years: No If all of the above answers are "NO", then may proceed with Cephalosporin use.     Prior to Admission medications   Medication Sig Start Date End Date Taking? Authorizing Provider  acetaminophen (TYLENOL) 500 MG tablet Take 2 tablets (1,000 mg total) by mouth every 6 (six) hours as needed. Patient taking differently: Take 250 mg by mouth daily as needed for moderate pain or headache. 02/17/16  Yes Roxy Horseman, PA-C  albuterol (PROVENTIL HFA;VENTOLIN HFA) 108 (90 Base) MCG/ACT inhaler Inhale 2 puffs into the lungs every 6 (six) hours as needed for wheezing or shortness of breath.   Yes [provider]  aspirin EC 81 MG tablet Take 81 mg by mouth daily.   Yes [provider]  BREZTRI AEROSPHERE 160-9-4.8 MCG/ACT AERO Inhale 1 puff into the lungs daily. 02/24/21  Yes [provider]  furosemide (LASIX) 20 MG tablet Take 1 tablet (20 mg total) by mouth daily. 05/15/17  Yes Mikhail, [place], DO  Multiple Vitamin (MULTIVITAMIN WITH MINERALS) TABS tablet Take 1 tablet by mouth daily.   Yes [provider]  omeprazole (PRILOSEC) 20 MG capsule Take 1 capsule (20 mg total) by mouth daily as needed (for indegestion). 05/14/17  Yes Mikhail, Nita Sells, DO  rosuvastatin (CRESTOR) 40 MG tablet Take 40 mg by mouth daily. 02/17/21  Yes [provider]  oxyCODONE-acetaminophen (PERCOCET/ROXICET) 5-325 MG tablet Take 1 tablet by mouth every 4 (four) hours as needed for severe pain. Patient not taking: Reported on 02/28/2021 08/22/20   Long, Arlyss Repress, MD    Social History   Socioeconomic History   Marital status: Married    Spouse name: Not on file   Number of children: Not on file   Years of education: Not on file   Highest education level: Not on file  Occupational History   Not on file  Tobacco Use   Smoking status: Every Day    Packs/day: 0.25    Years: 50.00    Pack years: 12.50    Types: Cigarettes   Smokeless tobacco: Never  Vaping Use   Vaping Use: Never used  Substance and Sexual Activity   Alcohol use: Yes    Alcohol/week: 8.0 standard drinks    Types: 8 Cans of beer per week    Comment: occ   Drug use: No   Sexual activity: Not on file  Other Topics Concern   Not on file  Social History Narrative   Not on file   Social Determinants of Health   Financial Resource Strain: Not on file  Food Insecurity: Not on file  Transportation Needs: Not on file  Physical Activity: Not on file  Stress: Not on file  Social Connections: Not on file  Intimate Partner Violence: Not on file     Family History  Problem Relation Age of Onset    Hypertension Sister    Hyperlipidemia Sister    Diabetes Sister    Peripheral Artery Disease Father     ROS: Numbness in the right leg has resolved, history of drainage of right groin   Physical Examination  Vitals:   03/01/2021 2035 02/16/2021 2045  BP: (!) 131/96 127/77  Pulse: 91 73  Resp: 20 20  Temp:    SpO2: 99% 100%   Body mass index is 17.73 kg/m.  Awake alert oriented His respirations are nonlabored on 3 L nasal cannula Abdomen is soft and nontender Left  common femoral pulses palpable Right common femoral as pictured below, there is very strong pulsation with 2 areas that appear pseudoaneurysmal and 1 area that is darkened and appears that it has drained with a scab on it There are no signals in the right foot but the foot is sensory and motor intact There is a weak peroneal signal on the left side and left foot is sensorimotor intact    CBC    Component Value Date/Time   WBC 8.4 03/05/2021 1647   RBC 4.84 02/06/2021 1647   HGB 16.6 02/12/2021 1647   HCT 47.9 02/07/2021 1647   PLT 151 02/09/2021 1647   MCV 99.0 02/25/2021 1647   MCH 34.3 (H) 03/01/2021 1647   MCHC 34.7 02/16/2021 1647   RDW 14.7 02/20/2021 1647   LYMPHSABS 1.2 03/06/2021 1647   MONOABS 0.8 03/07/2021 1647   EOSABS 0.0 03/02/2021 1647   BASOSABS 0.1 03/02/2021 1647    BMET    Component Value Date/Time   NA 135 02/26/2021 1647   K 3.9 02/07/2021 1647   CL 103 02/24/2021 1647   CO2 21 (L) 02/22/2021 1647   GLUCOSE 116 (H) 02/24/2021 1647   BUN 9 02/13/2021 1647   CREATININE 1.08 02/23/2021 1647   CALCIUM 9.1 02/17/2021 1647   GFRNONAA >60 02/17/2021 1647   GFRAA >60 06/21/2017 0305    COAGS: Lab Results  Component Value Date   INR 1.1 02/24/2021   INR 1.21 06/19/2017   INR 1.19 06/01/2017     Non-Invasive Vascular Imaging:   CTA IMPRESSION: VASCULAR   Changes consistent with aortobifemoral bypass graft with occlusion of the right limb at its origin. This occlusion  continues to the level of the tuck chin down site in the right groin. Aneurysmal dilatation at the touch down site is noted with a focal outpouching of density which likely represents a pseudoaneurysm although evaluation is difficult due to lack of contrast opacification.   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 Reconstitution of the profundus femoral artery branches is seen with subsequent distal reconstitution of the popliteal artery just above the knee joint. Three-vessel runoff to the ankle is noted as described.   Patent left limb of the aortobifemoral bypass graft.   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 Antegrade flow into the profundus femoral branches is seen with reconstitution of the junction of the superficial femoral artery and popliteal artery on the left. Popliteal trifurcation is patent as described.  ASSESSMENT/PLAN: This is a 65 y.o. 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  By physical exam the limb is patent but possibly he has thrombosed the outflow which prevented the limb from filling on CT.  There is evidence of 2 pseudoaneurysms which possibly are infectious in etiology on the right with patient notable history of drainage from the right groin although no drainage today.  It does appear that he is at exceedingly high risk of bleeding from these pseudoaneurysms and so I have recommended urgent operative repair.  This will include possible thrombectomy of the profundus will likely need reconnecting of the graft with new graft and possibly left to right femorofemoral bypass.  If this is an infectious process he will need long-term antibiotics and consideration of muscle flap coverage.  Patient would like to delay the surgery I think this would be exceedingly high risk.  Ultimately he is agreeable to proceed and I discussed the risks including  the risks of  bleeding requiring transfusion, the risk to his heart with unknown underlying coronary disease, the risk for prolonged ventilation given need for oxygen at rest currently and the risk of limb loss.  He demonstrates good understanding and consent was signed.  Alyne Martinson C. Randie Heinz, MD [facility] [place] Office: [phone] Pager: [phone]

## 2021-02-28 ENCOUNTER — Encounter (HOSPITAL_COMMUNITY): Payer: Self-pay | Admitting: Vascular Surgery

## 2021-02-28 DIAGNOSIS — I998 Other disorder of circulatory system: Secondary | ICD-10-CM | POA: Diagnosis present

## 2021-02-28 DIAGNOSIS — T82898A Other specified complication of vascular prosthetic devices, implants and grafts, initial encounter: Secondary | ICD-10-CM | POA: Diagnosis not present

## 2021-02-28 DIAGNOSIS — I724 Aneurysm of artery of lower extremity: Secondary | ICD-10-CM | POA: Diagnosis not present

## 2021-02-28 DIAGNOSIS — I743 Embolism and thrombosis of arteries of the lower extremities: Secondary | ICD-10-CM | POA: Diagnosis not present

## 2021-02-28 LAB — BASIC METABOLIC PANEL
Anion gap: 9 (ref 5–15)
BUN: 5 mg/dL — ABNORMAL LOW (ref 8–23)
CO2: 22 mmol/L (ref 22–32)
Calcium: 8 mg/dL — ABNORMAL LOW (ref 8.9–10.3)
Chloride: 104 mmol/L (ref 98–111)
Creatinine, Ser: 0.83 mg/dL (ref 0.61–1.24)
GFR, Estimated: >60 mL/min (ref >60)
Glucose, Bld: 119 mg/dL — ABNORMAL HIGH (ref 70–99)
Potassium: 3.6 mmol/L (ref 3.5–5.1)
Sodium: 135 mmol/L (ref 135–145)

## 2021-02-28 LAB — LIPID PANEL
Cholesterol: 119 mg/dL (ref 0–200)
HDL: 72 mg/dL (ref >40)
LDL Cholesterol: 33 mg/dL (ref 0–99)
Total CHOL/HDL Ratio: 1.7 RATIO
Triglycerides: 68 mg/dL (ref <150)
VLDL: 14 mg/dL (ref 0–40)

## 2021-02-28 LAB — CBC
HCT: 42.4 % (ref 39.0–52.0)
Hemoglobin: 14.2 g/dL (ref 13.0–17.0)
MCH: 33.4 pg (ref 26.0–34.0)
MCHC: 33.5 g/dL (ref 30.0–36.0)
MCV: 99.8 fL (ref 80.0–100.0)
Platelets: 160 10*3/uL (ref 150–400)
RBC: 4.25 MIL/uL (ref 4.22–5.81)
RDW: 14.7 % (ref 11.5–15.5)
WBC: 12.6 10*3/uL — ABNORMAL HIGH (ref 4.0–10.5)
nRBC: 0 % (ref 0.0–0.2)

## 2021-02-28 LAB — POCT I-STAT 7, (LYTES, BLD GAS, ICA,H+H)
Acid-base deficit: 3 mmol/L — ABNORMAL HIGH (ref 0.0–2.0)
Bicarbonate: 23.3 mmol/L (ref 20.0–28.0)
Calcium, Ion: 1.23 mmol/L (ref 1.15–1.40)
HCT: 42 % (ref 39.0–52.0)
Hemoglobin: 14.3 g/dL (ref 13.0–17.0)
O2 Saturation: 100 %
Patient temperature: 36.2
Potassium: 3.6 mmol/L (ref 3.5–5.1)
Sodium: 137 mmol/L (ref 135–145)
TCO2: 25 mmol/L (ref 22–32)
pCO2 arterial: 42 mmHg (ref 32.0–48.0)
pH, Arterial: 7.348 — ABNORMAL LOW (ref 7.350–7.450)
pO2, Arterial: 357 mmHg — ABNORMAL HIGH (ref 83.0–108.0)

## 2021-02-28 MED ORDER — SODIUM CHLORIDE 0.9 % IV SOLN
500.0000 mL | Freq: Once | INTRAVENOUS | Status: AC | PRN
  Administered 2021-03-14: 500 mL via INTRAVENOUS

## 2021-02-28 MED ORDER — HYDROMORPHONE HCL 1 MG/ML IJ SOLN: 0.2500 mg | INTRAMUSCULAR | Status: DC | PRN

## 2021-02-28 MED ORDER — FUROSEMIDE 20 MG PO TABS
20.0000 mg | ORAL_TABLET | Freq: Every day | ORAL | Status: DC
  Administered 2021-02-28 – 2021-03-12 (×12): 20 mg via ORAL
  Filled 2021-02-28 (×14): qty 1

## 2021-02-28 MED ORDER — ALBUTEROL SULFATE (2.5 MG/3ML) 0.083% IN NEBU
2.5000 mg | INHALATION_SOLUTION | Freq: Four times a day (QID) | RESPIRATORY_TRACT | Status: DC | PRN
  Filled 2021-02-28: qty 3

## 2021-02-28 MED ORDER — PHENOL 1.4 % MT LIQD: 1.0000 | OROMUCOSAL | Status: DC | PRN

## 2021-02-28 MED ORDER — BUDESON-GLYCOPYRROL-FORMOTEROL 160-9-4.8 MCG/ACT IN AERO
1.0000 | INHALATION_SPRAY | Freq: Every day | RESPIRATORY_TRACT | Status: DC
Start: 2021-02-28 — End: 2021-02-28

## 2021-02-28 MED ORDER — FLUTICASONE FUROATE-VILANTEROL 200-25 MCG/ACT IN AEPB
1.0000 | INHALATION_SPRAY | Freq: Every day | RESPIRATORY_TRACT | Status: DC
  Administered 2021-03-01 – 2021-03-13 (×12): 1 via RESPIRATORY_TRACT
  Filled 2021-02-28: qty 28

## 2021-02-28 MED ORDER — SODIUM CHLORIDE 0.9 % IV SOLN
2.0000 g | Freq: Once | INTRAVENOUS | Status: AC
  Administered 2021-02-28: 05:00:00 2 g via INTRAVENOUS
  Filled 2021-02-28: qty 2

## 2021-02-28 MED ORDER — HYDRALAZINE HCL 20 MG/ML IJ SOLN: 5.0000 mg | INTRAMUSCULAR | Status: DC | PRN

## 2021-02-28 MED ORDER — DOCUSATE SODIUM 100 MG PO CAPS
100.0000 mg | ORAL_CAPSULE | Freq: Every day | ORAL | Status: DC
  Administered 2021-03-01: 09:00:00 100 mg via ORAL
  Filled 2021-02-28 (×2): qty 1

## 2021-02-28 MED ORDER — VANCOMYCIN HCL IN DEXTROSE 1-5 GM/200ML-% IV SOLN
1000.0000 mg | INTRAVENOUS | Status: DC
  Administered 2021-03-01 – 2021-03-05 (×4): 1000 mg via INTRAVENOUS
  Filled 2021-02-28 (×4): qty 200

## 2021-02-28 MED ORDER — ONDANSETRON HCL 4 MG/2ML IJ SOLN
INTRAMUSCULAR | Status: DC | PRN
  Administered 2021-02-28: 4 mg via INTRAVENOUS

## 2021-02-28 MED ORDER — ADULT MULTIVITAMIN W/MINERALS CH
1.0000 | ORAL_TABLET | Freq: Every day | ORAL | Status: DC
  Administered 2021-02-28 – 2021-03-12 (×11): 1 via ORAL
  Filled 2021-02-28 (×14): qty 1

## 2021-02-28 MED ORDER — SODIUM CHLORIDE 0.9 % IV SOLN
2.0000 g | Freq: Two times a day (BID) | INTRAVENOUS | Status: DC
  Administered 2021-02-28 – 2021-03-07 (×14): 2 g via INTRAVENOUS
  Filled 2021-02-28 (×14): qty 2

## 2021-02-28 MED ORDER — LABETALOL HCL 5 MG/ML IV SOLN: 10.0000 mg | INTRAVENOUS | Status: DC | PRN

## 2021-02-28 MED ORDER — ACETAMINOPHEN 325 MG RE SUPP
325.0000 mg | RECTAL | Status: DC | PRN
  Filled 2021-02-28: qty 2

## 2021-02-28 MED ORDER — OXYCODONE-ACETAMINOPHEN 5-325 MG PO TABS
1.0000 | ORAL_TABLET | ORAL | Status: DC | PRN
  Administered 2021-02-28: 21:00:00 1 via ORAL
  Administered 2021-02-28 – 2021-03-05 (×11): 2 via ORAL
  Administered 2021-03-06 (×2): 1 via ORAL
  Administered 2021-03-07: 22:00:00 2 via ORAL
  Administered 2021-03-07 (×2): 1 via ORAL
  Administered 2021-03-08 – 2021-03-12 (×17): 2 via ORAL
  Filled 2021-02-28 (×6): qty 2
  Filled 2021-02-28: qty 1
  Filled 2021-02-28 (×2): qty 2
  Filled 2021-02-28: qty 1
  Filled 2021-02-28: qty 2
  Filled 2021-02-28: qty 1
  Filled 2021-02-28 (×4): qty 2
  Filled 2021-02-28: qty 1
  Filled 2021-02-28 (×2): qty 2
  Filled 2021-02-28: qty 1
  Filled 2021-02-28 (×5): qty 2
  Filled 2021-02-28: qty 1
  Filled 2021-02-28 (×8): qty 2
  Filled 2021-02-28: qty 1
  Filled 2021-02-28: qty 2

## 2021-02-28 MED ORDER — NICOTINE 7 MG/24HR TD PT24
7.0000 mg | MEDICATED_PATCH | Freq: Every day | TRANSDERMAL | Status: DC
  Administered 2021-02-28 – 2021-03-13 (×13): 7 mg via TRANSDERMAL
  Filled 2021-02-28 (×19): qty 1

## 2021-02-28 MED ORDER — MAGNESIUM SULFATE 2 GM/50ML IV SOLN: 2.0000 g | Freq: Every day | INTRAVENOUS | Status: DC | PRN

## 2021-02-28 MED ORDER — PROTAMINE SULFATE 10 MG/ML IV SOLN
INTRAVENOUS | Status: DC | PRN
  Administered 2021-02-28: 25 mg via INTRAVENOUS

## 2021-02-28 MED ORDER — ASPIRIN EC 81 MG PO TBEC
81.0000 mg | DELAYED_RELEASE_TABLET | Freq: Every day | ORAL | Status: DC
  Administered 2021-02-28 – 2021-03-12 (×12): 81 mg via ORAL
  Filled 2021-02-28 (×14): qty 1

## 2021-02-28 MED ORDER — KETOROLAC TROMETHAMINE 15 MG/ML IJ SOLN
15.0000 mg | Freq: Four times a day (QID) | INTRAMUSCULAR | Status: AC | PRN
  Administered 2021-02-28 – 2021-03-01 (×3): 15 mg via INTRAVENOUS
  Filled 2021-02-28 (×3): qty 1

## 2021-02-28 MED ORDER — MORPHINE SULFATE (PF) 2 MG/ML IV SOLN
2.0000 mg | INTRAVENOUS | Status: DC | PRN
  Administered 2021-03-01 – 2021-03-11 (×14): 2 mg via INTRAVENOUS
  Filled 2021-02-28 (×14): qty 1

## 2021-02-28 MED ORDER — ONDANSETRON HCL 4 MG/2ML IJ SOLN
4.0000 mg | Freq: Four times a day (QID) | INTRAMUSCULAR | Status: DC | PRN
  Administered 2021-03-01 – 2021-03-13 (×4): 4 mg via INTRAVENOUS
  Filled 2021-02-28 (×4): qty 2

## 2021-02-28 MED ORDER — UMECLIDINIUM BROMIDE 62.5 MCG/ACT IN AEPB
1.0000 | INHALATION_SPRAY | Freq: Every day | RESPIRATORY_TRACT | Status: DC
Start: 2021-02-28 — End: 2021-03-15
  Administered 2021-03-01 – 2021-03-13 (×11): 1 via RESPIRATORY_TRACT
  Filled 2021-02-28 (×2): qty 7

## 2021-02-28 MED ORDER — ALUM & MAG HYDROXIDE-SIMETH 200-200-20 MG/5ML PO SUSP
15.0000 mL | ORAL | Status: DC | PRN
  Administered 2021-03-12: 16:00:00 30 mL via ORAL
  Filled 2021-02-28 (×2): qty 30

## 2021-02-28 MED ORDER — PANTOPRAZOLE SODIUM 40 MG PO TBEC
40.0000 mg | DELAYED_RELEASE_TABLET | Freq: Every day | ORAL | Status: DC
  Administered 2021-02-28 – 2021-03-12 (×12): 40 mg via ORAL
  Filled 2021-02-28 (×14): qty 1

## 2021-02-28 MED ORDER — POLYETHYLENE GLYCOL 3350 17 G PO PACK
17.0000 g | PACK | Freq: Every day | ORAL | Status: DC | PRN
  Administered 2021-02-28 – 2021-03-12 (×3): 17 g via ORAL
  Filled 2021-02-28 (×3): qty 1

## 2021-02-28 MED ORDER — DEXAMETHASONE SODIUM PHOSPHATE 10 MG/ML IJ SOLN
INTRAMUSCULAR | Status: DC | PRN
  Administered 2021-02-28: 10 mg via INTRAVENOUS

## 2021-02-28 MED ORDER — CHLORHEXIDINE GLUCONATE CLOTH 2 % EX PADS: 6.0000 | MEDICATED_PAD | Freq: Every day | CUTANEOUS | Status: DC

## 2021-02-28 MED ORDER — SUGAMMADEX SODIUM 200 MG/2ML IV SOLN
INTRAVENOUS | Status: DC | PRN
  Administered 2021-02-28: 200 mg via INTRAVENOUS

## 2021-02-28 MED ORDER — ACETAMINOPHEN 325 MG PO TABS
325.0000 mg | ORAL_TABLET | ORAL | Status: DC | PRN
  Administered 2021-03-11: 650 mg via ORAL
  Administered 2021-03-12: 325 mg via ORAL
  Administered 2021-03-12: 16:00:00 650 mg via ORAL
  Filled 2021-02-28 (×3): qty 2

## 2021-02-28 MED ORDER — SODIUM CHLORIDE 0.9 % IV SOLN: INTRAVENOUS | Status: DC

## 2021-02-28 MED ORDER — HEPARIN SODIUM (PORCINE) 5000 UNIT/ML IJ SOLN
5000.0000 [IU] | Freq: Three times a day (TID) | INTRAMUSCULAR | Status: DC
  Administered 2021-02-28 – 2021-03-18 (×50): 5000 [IU] via SUBCUTANEOUS
  Filled 2021-02-28 (×50): qty 1

## 2021-02-28 MED ORDER — BISACODYL 10 MG RE SUPP
10.0000 mg | Freq: Every day | RECTAL | Status: DC | PRN
  Administered 2021-03-01 – 2021-03-03 (×2): 10 mg via RECTAL
  Filled 2021-02-28 (×2): qty 1

## 2021-02-28 MED ORDER — 0.9 % SODIUM CHLORIDE (POUR BTL) OPTIME
TOPICAL | Status: DC | PRN
  Administered 2021-02-28: 01:00:00 2500 mL

## 2021-02-28 MED ORDER — METOPROLOL TARTRATE 5 MG/5ML IV SOLN: 2.0000 mg | INTRAVENOUS | Status: DC | PRN

## 2021-02-28 MED ORDER — GUAIFENESIN-DM 100-10 MG/5ML PO SYRP: 15.0000 mL | ORAL_SOLUTION | ORAL | Status: DC | PRN

## 2021-02-28 MED ORDER — POTASSIUM CHLORIDE CRYS ER 20 MEQ PO TBCR: 20.0000 meq | EXTENDED_RELEASE_TABLET | Freq: Every day | ORAL | Status: DC | PRN

## 2021-02-28 MED ORDER — ROSUVASTATIN CALCIUM 20 MG PO TABS
40.0000 mg | ORAL_TABLET | Freq: Every day | ORAL | Status: DC
  Administered 2021-02-28 – 2021-03-12 (×12): 40 mg via ORAL
  Filled 2021-02-28 (×14): qty 2

## 2021-02-28 MED ORDER — PHENYLEPHRINE HCL (PRESSORS) 10 MG/ML IV SOLN
INTRAVENOUS | Status: DC | PRN
  Administered 2021-02-28: 40 ug via INTRAVENOUS

## 2021-02-28 NOTE — Progress Notes (Addendum)
°  Progress Note    02/28/2021 7:35 AM 1 Day Post-Op  Subjective:  no complaints; wants to sit up.  Afebrile 100's-120's systolic HR 70's-100 NSR 98% 4LO2NC  Abx:  vanc and cefepime  Vitals:   02/28/21 0413 02/28/21 0714  BP: 105/64 107/67  Pulse: 93 85  Resp: 16 19  Temp: 98.6 F (37 C) 98 F (36.7 C)  SpO2: 98% 98%    Physical Exam: Cardiac:  regular Lungs:  non labored Incisions:  right groin with wound vac with good seal Extremities:  brisk right DP/PT doppler signals and right calf is very soft and non tender.    CBC    Component Value Date/Time   WBC 12.6 (H) 02/28/2021 0315   RBC 4.25 02/28/2021 0315   HGB 14.2 02/28/2021 0315   HCT 42.4 02/28/2021 0315   PLT 160 02/28/2021 0315   MCV 99.8 02/28/2021 0315   MCH 33.4 02/28/2021 0315   MCHC 33.5 02/28/2021 0315   RDW 14.7 02/28/2021 0315   LYMPHSABS 1.2 03/03/2021 1647   MONOABS 0.8 03/06/2021 1647   EOSABS 0.0 03/02/2021 1647   BASOSABS 0.1 02/25/2021 1647    BMET    Component Value Date/Time   NA 135 02/28/2021 0315   K 3.6 02/28/2021 0315   CL 104 02/28/2021 0315   CO2 22 02/28/2021 0315   GLUCOSE 119 (H) 02/28/2021 0315   BUN 5 (L) 02/28/2021 0315   CREATININE 0.83 02/28/2021 0315   CALCIUM 8.0 (L) 02/28/2021 0315   GFRNONAA >60 02/28/2021 0315   GFRAA >60 06/21/2017 0305    INR    Component Value Date/Time   INR 1.1 02/13/2021 1647     Intake/Output Summary (Last 24 hours) at 02/28/2021 0735 Last data filed at 02/28/2021 0516 Gross per 24 hour  Intake 2278.07 ml  Output 1000 ml  Net 1278.07 ml     Assessment/Plan:  65 y.o. male is s/p:  1.  Reexploration right groin greater than 30 days 2.  Repair of right common femoral pseudoaneurysm with 8 mm Dacron interposition graft from right limb of aortobifemoral bypass to profunda branches 3.  Ligation of right common femoral artery 4.  Sartorial muscle flap coverage of both right femoral graft 5.  Negative pressure dressing  placement right groin wound  1 Day Post-Op   -pt with brisk right PT/DP doppler signals -wound vac with good seal. -gram stain is in progress - continue broad spectrum abx currently -mobilize oob today. -DVT prophylaxis:  sq heparin    Doreatha Massed, PA-C [facility] [phone] 02/28/2021 7:35 AM   I have independently interviewed and examined patient and agree with PA assessment and plan above.  Right groin wound VAC in place.  He is now complaining of of back pain.  I have added Toradol 30 mg for 3 doses given that his renal function is normal.  Out of bed as tolerates.  Will need wound VAC change this weekend and likely set up for home.  Will involve ID once operative cultures have resulted.  Debria Broecker C. Randie Heinz, MD [facility] [place] Office: [phone] Pager: [phone]

## 2021-02-28 NOTE — TOC Initial Note (Signed)
Transition of Care (TOC) - Initial/Assessment Note  Donn Pierini RN, BSN Transitions of Care Unit 4E- RN Case Manager See Treatment Team for direct phone #    Patient Details  Name: Wayne [name] MRN: [id number] Date of Birth: Jun 28, 1955  Transition of [facility]) CM/SW Contact:    Darrold Span, RN Phone Number: 02/28/2021, 3:05 PM  Clinical Narrative:                 Pt s/p femoral aneurysm repair with wound VAC placement. From Home. Noted orders for DME- home wound VAC and HHRN.  [employer] home wound VAC form has been signed and faxed to [facility] # [facility] [place] off today and will f/u on Monday- form on chart if needed) CM in to speak with pt at bedside for [facility] needs, pt states he is in pain, uncomfortable at present, not really best time to discuss transition needs. Briefly introduced self and role, and explained potential needs for discharge. List provided for [facility] choice Per CMS guidelines from medicare.gov website with star ratings (copy placed in shadow chart) - TOC will follow up with pt at a better time for [facility] needs and coordination of transition needs.   Bedside RN informed pt would like something for pain.   Expected Discharge Plan: Home w Home Health Services Barriers to Discharge: Continued Medical Work up   Patient Goals and CMS Choice Patient states their goals for this hospitalization and ongoing recovery are:: go home CMS Medicare.gov Compare Post Acute Care list provided to:: Patient Choice offered to / list presented to : Patient  Expected Discharge Plan and Services Expected Discharge Plan: Home w Home Health Services   Discharge Planning Services: CM Consult Post Acute Care Choice: Durable Medical Equipment, Home Health Living arrangements for the past 2 months: Single Family Home                 DME Arranged: Vac DME Agency: [employer] Date DME Agency Contacted: 02/28/21   Representative spoke with at DME Agency: [facility] Arranged: RN          Prior Living  Arrangements/Services Living arrangements for the past 2 months: Single Family Home Lives with:: Self Patient language and need for interpreter reviewed:: Yes Do you feel safe going back to the place where you live?: Yes      Need for Family Participation in Patient Care: Yes (Comment)     Criminal Activity/Legal Involvement Pertinent to Current Situation/Hospitalization: No - Comment as needed  Activities of Daily Living Home Assistive Devices/Equipment: None ADL Screening (condition at time of admission) Patient's cognitive ability adequate to safely complete daily activities?: Yes Is the patient deaf or have difficulty hearing?: No Does the patient have difficulty seeing, even when wearing glasses/contacts?: No Does the patient have difficulty concentrating, remembering, or making decisions?: No Patient able to express need for assistance with ADLs?: Yes Does the patient have difficulty dressing or bathing?: No Independently performs ADLs?: Yes (appropriate for developmental age) Does the patient have difficulty walking or climbing stairs?: No Weakness of Legs: None Weakness of Arms/Hands: None  Permission Sought/Granted                  Emotional Assessment Appearance:: Appears stated age Attitude/Demeanor/Rapport: Other (comment) (uncomfortable/pain) Affect (typically observed): Irritable, Restless Orientation: : Oriented to Self, Oriented to Place, Oriented to  Time, Oriented to Situation Alcohol / Substance Use: Not Applicable Psych Involvement: No (comment)  Admission diagnosis:  Arterial occlusion [I70.90] Right leg pain [  M79.604] Right leg numbness [R20.0] Right groin pain [R10.31] Limb ischemia [I99.8] 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] Ischemia of extremity [I99.8] Patient Active Problem List   Diagnosis Date Noted   Ischemia of extremity 02/28/2021   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   Elbow joint effusion, left 09/04/2020   Hepatic cirrhosis  (HCC) 06/20/2017   Ileus  06/19/2017   Abdominal pain, acute, left lower quadrant 06/12/2017   Respiratory failure with hypoxia (HCC) 06/12/2017   Acute left lower quadrant pain 06/12/2017   Constipation 06/12/2017   PAH (pulmonary artery hypertension) (HCC)    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   Malnutrition of moderate degree 05/12/2017   COPD with acute exacerbation (HCC) 05/11/2017   Fever 05/11/2017   ETOH abuse 05/11/2017   Anemia    COPD (chronic obstructive pulmonary disease) (HCC)    Hypertension    Critical lower limb ischemia (HCC) 04/01/2017   Peripheral vascular disease (HCC) 03/22/2017   Simple chronic bronchitis (HCC) 03/22/2017   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 12/11/2016   Degenerative disc disease, lumbar 12/11/2016   Primary osteoarthritis of right hip 12/11/2016   Hypokalemia 01/19/2016   Chronic systolic heart failure (HCC) 01/18/2016   Acute respiratory failure with hypoxia (HCC) 01/18/2016   Bullous lesion 01/18/2016   Drinks beer 01/18/2016   Hyperlipidemia 01/18/2016   Hyponatremia 01/18/2016   Tobacco abuse 01/18/2016   PCP:  Clemencia Course, PA-C Pharmacy:   [id number] - [place], [place] - [address] [place] [address] Phone: [phone] Fax: [phone]     Social Determinants of Health (SDOH) Interventions    Readmission Risk Interventions No flowsheet data found.

## 2021-02-28 NOTE — Op Note (Signed)
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 Randie Heinz, MD Assistant: Doreatha Massed, PA Procedure Performed: 1.  Reexploration right groin greater than 30 days 2.  Repair of right common femoral pseudoaneurysm with 8 mm Dacron interposition graft from right limb of aortobifemoral bypass to profunda branches 3.  Ligation of right common femoral artery 4.  Sartorial muscle flap coverage of both right femoral graft 5.  Negative pressure dressing placement right groin wound  Indications: 65 year old male with history of aortobifemoral bypass graft.  Yesterday he developed numbness in his leg he underwent CT angio which demonstrated occlusion of the right limb of the aortobifemoral bypass graft with what appeared to be occluded common femoral pseudoaneurysm.  He is now indicated for repair.  An assistant was necessary to facilitate exposure and expedite the case.  Findings: There were 2 areas of large pseudoaneurysmal degeneration.  The graft had completely dehisced from the common femoral artery.  The only area of outflow in his leg was into his common femoral artery cephalad into the external iliac artery.  I was able to isolate 2 profunda branches and perform endarterectomy of these to establish backbleeding.  The right limb of the aortobifemoral bypass graft was trimmed back to healthy appearing graft and interposition graft was placed from the limb down to the 2 profunda branches which were spatulated together.  At completion there was good signal in both profunda branches.  Given that I had excised the 2 pseudoaneurysms there was no skin coverage for the graft will send with muscle was isolated and flapped over the graft.  The wound was partially closed with staples and a negative pressure dressing was placed on top of  the muscle flap.   Procedure:  The patient was identified in the holding area and taken to the operating was put supine operative table general anesthesia was induced.  He was sterilely prepped and draped in the bilateral groins and abdomen in the usual fashion, antibiotics were ministered timeout was called.  I began with incision above the 2 obvious areas of pseudoaneurysm.  I carried this down laterally to both pseudoaneurysms.  I then traced medially around them as well.  I was able to get above the pseudoaneurysms and under the inguinal ligament and I divided part of the inguinal ligament.  I was able to get a clamp down around the graft and clamped this and the pseudoaneurysm is no longer had pulsatility.  I then turned my attention distally where I was able to expose the SFA.  The femoral vein was exposed medially and protected.  5000 units of heparin was administered.  I clamped the inflow of the graft.  I open the pseudoaneurysms there was significant thrombus and some turbid appearing fluid and this was sent for culture.  The graft was trimmed back off the common femoral artery.  The only bleeding I had in the wound was from the external iliac artery bleeding down and a 4 Fogarty was placed with a stopcock to control the bleeding.  I then debrided all of the pseudoaneurysm cavity back to healthy tissue.  There were no profunda branches identifiable.  I debrided the graft back to healthy tissue as well.  I then traced the SFA back to where I found profunda branches.  I traced this back in the common femoral artery and they were unfortunately occluded.  I endarterectomized them proximally and then embolectomized 1 and was able to get very strong backbleeding and endarterectomized the other reaching far down with hemostats and I was able to get backbleeding from that as well and these were clamped.  An 8 mm dacryon tube graft was then brought to the field.  This was sewn end-to-end to the limb of the graft  proximally with 5-0 Prolene suture and then distally I spatulated the 2 profunda branches spatulated the graft and sewed end-to-end with 6-0 Prolene suture.  At the completion of this I had good signals in both branches of the profunda.  With this I elected to ligate the common femoral artery to prevent the backbleeding from the external iliac artery.  I did this with 5-0 Prolene suture and ultimately a large 2-0 Vicryl suture.  This controlled the bleeding well.  I still had good pulsatility in the graft and pulsatility in both profunda branches.  I administered 25 mg of protamine.  I thoroughly irrigated the wound.  I closed some of the inferior aspect of the incision with running 3-0 Vicryl.  I then placed staples inferior to this.  I was not going to be able to close all of this.  Through the incision I then mobilized the sartorius muscle from lateral to medial.  It was taken off the anterior superior iliac spine and bluntly mobilized medially.  I mobilized the to the inguinal ligament and tacked it with interrupted 2-0 Vicryl sutures to cover the graft.  A negative pressure dressing was then placed.  He was awakened from anesthesia having tolerated procedure well without immediate complication.  All counts were correct to completion.   EBL: 300cc  Shalicia Craghead C. Randie Heinz, MD [facility] Office: [phone] Pager: [phone]

## 2021-02-28 NOTE — Progress Notes (Signed)
Pharmacy Antibiotic Note  Wayne [name] is a 65 y.o. 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  Pharmacy has been consulted for vancomycin and cefepime dosing.  Plan: Vancomycin 1000 mg IV Q36H. Goal AUC 400-550.  Expected AUC 512.  SCr 1.08.  Cefepime 2g IV Q12H.  Height: 5\' 3"  (160 cm) Weight: 45.4 kg (100 lb 1.4 oz) IBW/kg (Calculated) : 56.9  Temp (24hrs), Avg:98.2 F (36.8 C), Min:98.2 F (36.8 C), Max:98.3 F (36.8 C)  Recent Labs  Lab 02/14/2021 1647 02/21/2021 1909  WBC 8.4  --   CREATININE 1.08  --   LATICACIDVEN 1.6 1.1    Estimated Creatinine Clearance: 42.6 mL/min (by C-G formula based on SCr of 1.08 mg/dL).    Allergies  Allergen Reactions   Penicillins Itching, Rash and Other (See Comments)    PATIENT HAS HAD A PCN REACTION WITH IMMEDIATE RASH, FACIAL/TONGUE/THROAT SWELLING, SOB, OR LIGHTHEADEDNESS WITH HYPOTENSION:  #  #  #  YES  #  #  #   Has patient had a PCN reaction causing severe rash involving mucus membranes or skin necrosis: No Has patient had a PCN reaction that required hospitalization: No Has patient had a PCN reaction occurring within the last 10 years: No If all of the above answers are "NO", then may proceed with Cephalosporin use.      Thank you for allowing pharmacy to be a part of this patients care.  03/01/21, PharmD, BCPS  02/28/2021 3:20 AM

## 2021-02-28 NOTE — Evaluation (Signed)
Occupational Therapy Evaluation Patient Details Name: Wayne [name] MRN: [id number] DOB: [birth year] Today's Date: 02/28/2021   History of Present Illness Pt is a 65 y.o. male admitted 02/17/2021 for R groin reexploration, repair of R common femoral pseudoaneurysm, sartorial muscle flap coverage with R groin wound vac placement. PMH includes CHF, COPD, HTN, PVD, pulmonary HTN, arthritis, malnutrition.   Clinical Impression   Patient is s/p R groining reexploration surgery resulting in functional limitations due to the deficits listed below (see OT problem list). Pt currently limited by back pain and reports extreme cold. Pt with room temp increased, barrier or blankets placed on window seal, x2 warm blankets placed on patient. Pt reports feeling better and less cold at end of session. Pt very internally fixated.  Patient will benefit from skilled OT acutely to increase independence and safety with ADLS to allow discharge HHOT.       Recommendations for follow up therapy are one component of a multi-disciplinary discharge planning process, led by the attending physician.  Recommendations may be updated based on patient status, additional functional criteria and insurance authorization.   Follow Up Recommendations  Home health OT    Assistance Recommended at Discharge Frequent or constant Supervision/Assistance  Functional Status Assessment  Patient has had a recent decline in their functional status and demonstrates the ability to make significant improvements in function in a reasonable and predictable amount of time.  Equipment Recommendations  BSC/3in1    Recommendations for Other Services       Precautions / Restrictions Precautions Precautions: Fall;Other (comment) Precaution Comments: R groin wound vac Restrictions Weight Bearing Restrictions: No      Mobility Bed Mobility Overal bed mobility: Modified Independent             General bed mobility comments: long  sitting with use of arms for    Transfers                   General transfer comment: declined due to being cold, back hurting and restless.      Balance                                           ADL either performed or assessed with clinical judgement   ADL Overall ADL's : Needs assistance/impaired Eating/Feeding: Set up;Bed level Eating/Feeding Details (indicate cue type and reason): taking medication one pill at a time showing fine motor Grooming: Wash/dry face;Modified independent;Bed level   Upper Body Bathing: Set up;Sitting   Lower Body Bathing: Moderate assistance                 Toileting - Clothing Manipulation Details (indicate cue type and reason): tech assisting pt to the bathroom just prior to arrival and reports extensive assistance needed to get him to and from bathroom.       General ADL Comments: pt very diaphoretic on arrival after bathroom transfer. BP taken 129/65 and after session 145/73 (93) pt with continued Lower back pain and unable to describe with guided cues     Vision         Perception     Praxis      Pertinent Vitals/Pain Pain Assessment: Faces Faces Pain Scale: Hurts whole lot Pain Location: back ( lower back area) Pain Descriptors / Indicators: Discomfort;Constant;Grimacing;Guarding;Other (Comment);Restless (states "my kidneys feel like they are going to pop out") Pain Intervention(s):  Monitored during session;Repositioned;RN gave pain meds during session;Limited activity within patient's tolerance     Hand Dominance     Extremity/Trunk Assessment Upper Extremity Assessment Upper Extremity Assessment: Generalized weakness   Lower Extremity Assessment Lower Extremity Assessment: Defer to PT evaluation   Cervical / Trunk Assessment Cervical / Trunk Assessment: Other exceptions Cervical / Trunk Exceptions: currently with back pain limiting.   Communication Communication Communication: No  difficulties   Cognition Arousal/Alertness: Awake/alert Behavior During Therapy: Restless Overall Cognitive Status: Difficult to assess                                 General Comments: pt very internally distracted and making needs know very directly.     General Comments  RA 95% fixated on pain in back    Exercises     Shoulder Instructions      Home Living Family/patient expects to be discharged to:: Private residence Living Arrangements: Alone Available Help at Discharge: Family;Available PRN/intermittently Type of Home: House Home Access: Stairs to enter [employer] of Steps: 3 Entrance Stairs-Rails: None Home Layout: One level     Bathroom Shower/Tub: Chief Strategy Officer: Standard     Home Equipment: Cane - single point   Additional Comments: "I think I have a cane laying around somewhere." Sounds like multiple family members nearby available to assist if needed, but pt adamant about doing things independently      Prior Functioning/Environment Prior Level of Function : Independent/Modified Independent             Mobility Comments: Pt reports indep without DME          OT Problem List: Decreased activity tolerance;Impaired balance (sitting and/or standing);Decreased safety awareness;Decreased knowledge of use of DME or AE;Decreased knowledge of precautions;Cardiopulmonary status limiting activity;Pain      OT Treatment/Interventions: Self-care/ADL training;Therapeutic exercise;Energy conservation;DME and/or AE instruction;Manual therapy;Modalities;Therapeutic activities;Balance training;Patient/family education    OT Goals(Current goals can be found in the care plan section) Acute Rehab OT Goals Patient Stated Goal: to get back pain to stop OT Goal Formulation: With patient Time For Goal Achievement: 03/17/2021 Potential to Achieve Goals: Good  OT Frequency: Min 2X/week   Barriers to D/C: Decreased caregiver  support  lives alone but reports extended family in the area       Co-evaluation              AM-PAC OT "6 Clicks" Daily Activity     Outcome Measure Help from another person eating meals?: None Help from another person taking care of personal grooming?: None Help from another person toileting, which includes using toliet, bedpan, or urinal?: A Little Help from another person bathing (including washing, rinsing, drying)?: A Little Help from another person to put on and taking off regular upper body clothing?: A Little Help from another person to put on and taking off regular lower body clothing?: A Lot 6 Click Score: 19   End of Session Nurse Communication: Mobility status;Precautions  Activity Tolerance: Patient tolerated treatment well Patient left: in bed;with call bell/phone within reach;with bed alarm set  OT Visit Diagnosis: Unsteadiness on feet (R26.81);Muscle weakness (generalized) (M62.81);Pain                Time: 5573-2202 OT Time Calculation (min): 25 min Charges:  OT General Charges $OT Visit: 1 Visit OT Evaluation $OT Eval Moderate Complexity: 1 Mod   Brynn, OTR/L  Acute  Rehabilitation Services Pager: [phone] Office: [phone] .   Mateo Flow 02/28/2021, 1:48 PM

## 2021-02-28 NOTE — Evaluation (Signed)
Physical Therapy Evaluation Patient Details Name: Wayne [name] MRN: [id number] DOB: Apr 05, 1955 Today's Date: 02/28/2021  History of Present Illness  Pt is a 65 y.o. male admitted 02/22/2021 for R groin reexploration, repair of R common femoral pseudoaneurysm, sartorial muscle flap coverage with R groin wound vac placement. PMH includes CHF, COPD, HTN, PVD, pulmonary HTN, arthritis, malnutrition.   Clinical Impression  Pt presents with an overall decrease in functional mobility secondary to above. PTA, pt independent without DME, lives alone, values his independence. Today, pt moving well with supervision to min guard for balance; pt endorses expected post-op soreness. Pt would benefit from continued acute PT services to maximize functional mobility and independence prior to d/c home.     Recommendations for follow up therapy are one component of a multi-disciplinary discharge planning process, led by the attending physician.  Recommendations may be updated based on patient status, additional functional criteria and insurance authorization.  Follow Up Recommendations No PT follow up    Assistance Recommended at Discharge PRN  Functional Status Assessment Patient has had a recent decline in their functional status and demonstrates the ability to make significant improvements in function in a reasonable and predictable amount of time.  Equipment Recommendations  None recommended by PT    Recommendations for Other Services       Precautions / Restrictions Precautions Precautions: Fall;Other (comment) Precaution Comments: R groin wound vac Restrictions Weight Bearing Restrictions: No      Mobility  Bed Mobility Overal bed mobility: Modified Independent                  Transfers Overall transfer level: Needs assistance Equipment used: None Transfers: Sit to/from Stand;Bed to chair/wheelchair/BSC Sit to Stand: Supervision   Step pivot transfers: Supervision        General transfer comment: Supervision for safety and lines    Ambulation/Gait                  Stairs            Wheelchair Mobility    Modified Rankin (Stroke Patients Only)       Balance Overall balance assessment: Needs assistance   Sitting balance-Leahy Scale: Good Sitting balance - Comments: required assist to don socks with c/o R groin soreness; reports he normally props foot against bed rail to don socks/shoes     Standing balance-Leahy Scale: Fair Standing balance comment: performing ADL tasks static standing at EOB without overt instability                             Pertinent Vitals/Pain Pain Assessment: Faces Faces Pain Scale: Hurts a little bit Pain Location: R groin, L ankle Pain Descriptors / Indicators: Sore;Numbness    Home Living Family/patient expects to be discharged to:: Private residence Living Arrangements: Alone Available Help at Discharge: Family;Available PRN/intermittently Type of Home: House Home Access: Stairs to enter Entrance Stairs-Rails: None Entrance Stairs-Number of Steps: 3   Home Layout: One level Home Equipment: Cane - single point Additional Comments: "I think I have a cane laying around somewhere." Sounds like multiple family members nearby available to assist if needed, but pt adamant about doing things independently    Prior Function Prior Level of Function : Independent/Modified Independent             Mobility Comments: Pt reports indep without DME       Hand Dominance  Extremity/Trunk Assessment   Upper Extremity Assessment Upper Extremity Assessment: Generalized weakness    Lower Extremity Assessment Lower Extremity Assessment: Generalized weakness       Communication   Communication: No difficulties  Cognition Arousal/Alertness: Awake/alert Behavior During Therapy: WFL for tasks assessed/performed Overall Cognitive Status: Within Functional Limits for tasks  assessed                                 General Comments: WFL for majority of simple tasks, not formally assessed        General Comments General comments (skin integrity, edema, etc.): SpO2 95% on RA (inconsistent pleth reading), HR 90    Exercises     Assessment/Plan    PT Assessment Patient needs continued PT services  PT Problem List Decreased activity tolerance;Decreased balance;Decreased mobility;Decreased strength;Decreased knowledge of use of DME       PT Treatment Interventions Gait training;DME instruction;Stair training;Functional mobility training;Therapeutic activities;Therapeutic exercise;Balance training;Patient/family education    PT Goals (Current goals can be found in the Care Plan section)  Acute Rehab PT Goals Patient Stated Goal: I'm going to go home and keep doing what I'm able to do myself PT Goal Formulation: With patient Time For Goal Achievement: 03/28/2021 Potential to Achieve Goals: Good    Frequency Min 3X/week   Barriers to discharge Decreased caregiver support      Co-evaluation               AM-PAC PT "6 Clicks" Mobility  Outcome Measure Help needed turning from your back to your side while in a flat bed without using bedrails?: None Help needed moving from lying on your back to sitting on the side of a flat bed without using bedrails?: None Help needed moving to and from a bed to a chair (including a wheelchair)?: A Little Help needed standing up from a chair using your arms (e.g., wheelchair or bedside chair)?: A Little Help needed to walk in hospital room?: A Little Help needed climbing 3-5 steps with a railing? : A Little 6 Click Score: 20    End of Session   Activity Tolerance: Patient tolerated treatment well Patient left: in chair;with call bell/phone within reach Nurse Communication: Mobility status;Other (comment) (NT aware pt needs new chair alarm box batteries, bed change and gown change as  bed/linens/lines haven't changed since return from OR yesterday) PT Visit Diagnosis: Other abnormalities of gait and mobility (R26.89);Pain    Time: 8466-5993 PT Time Calculation (min) (ACUTE ONLY): 25 min   Charges:   PT Evaluation $PT Eval Low Complexity: 1 Low         Wayne [name], PT, DPT Acute Rehabilitation Services  Pager [phone] Office [phone]  Wayne [name] 02/28/2021, 8:31 AM

## 2021-02-28 NOTE — Transfer of Care (Signed)
Immediate Anesthesia Transfer of Care Note  Patient: Wayne [name]  Procedure(s) Performed: Repair of Right Femoral artery possible femoral bypass (Right: Groin) INTERPOSITION AORTA BIFEMORAL TO PROFUNDA BYPASS GRAFT (Right: Groin) MUSCLE FLAP CLOSURE (Right: Groin) APPLICATION OF WOUND VAC (Right: Groin)  Patient Location: PACU  Anesthesia Type:General  Level of Consciousness: drowsy  Airway & Oxygen Therapy: Patient Spontanous Breathing  Post-op Assessment: Report given to RN and Post -op Vital signs reviewed and stable  Post vital signs: Reviewed and stable  Last Vitals:  Vitals Value Taken Time  BP 129/70 02/28/21 0131  Temp    Pulse 77 02/28/21 0141  Resp 12 02/28/21 0141  SpO2 99 % 02/28/21 0141  Vitals shown include unvalidated device data.  Last Pain:  Vitals:   02/09/2021 1627  TempSrc: Oral  PainSc:          Complications: No notable events documented.

## 2021-02-28 NOTE — Anesthesia Postprocedure Evaluation (Signed)
Anesthesia Post Note  Patient: Wayne [name]  Procedure(s) Performed: Repair of Right Femoral artery possible femoral bypass (Right: Groin) INTERPOSITION AORTA BIFEMORAL TO PROFUNDA BYPASS GRAFT (Right: Groin) MUSCLE FLAP CLOSURE (Right: Groin) APPLICATION OF WOUND VAC (Right: Groin)     Patient location during evaluation: PACU Anesthesia Type: General Level of consciousness: awake and alert Pain management: pain level controlled Vital Signs Assessment: post-procedure vital signs reviewed and stable Respiratory status: spontaneous breathing, nonlabored ventilation and respiratory function stable Cardiovascular status: blood pressure returned to baseline and stable Postop Assessment: no apparent nausea or vomiting Anesthetic complications: no   No notable events documented.  Last Vitals:  Vitals:   02/28/21 0215 02/28/21 0413  BP: 122/68 105/64  Pulse: 78 93  Resp: 16 16  Temp: 36.8 C 37 C  SpO2: 100% 98%    Last Pain:  Vitals:   02/28/21 0516  TempSrc:   PainSc: 0-No pain                 Lauraann Missey,W. EDMOND

## 2021-03-01 LAB — CBC
HCT: 35.8 % — ABNORMAL LOW (ref 39.0–52.0)
Hemoglobin: 11.9 g/dL — ABNORMAL LOW (ref 13.0–17.0)
MCH: 33.5 pg (ref 26.0–34.0)
MCHC: 33.2 g/dL (ref 30.0–36.0)
MCV: 100.8 fL — ABNORMAL HIGH (ref 80.0–100.0)
Platelets: 165 10*3/uL (ref 150–400)
RBC: 3.55 MIL/uL — ABNORMAL LOW (ref 4.22–5.81)
RDW: 14.5 % (ref 11.5–15.5)
WBC: 10 10*3/uL (ref 4.0–10.5)
nRBC: 0 % (ref 0.0–0.2)

## 2021-03-01 LAB — BASIC METABOLIC PANEL
Anion gap: 7 (ref 5–15)
BUN: 12 mg/dL (ref 8–23)
CO2: 24 mmol/L (ref 22–32)
Calcium: 8.4 mg/dL — ABNORMAL LOW (ref 8.9–10.3)
Chloride: 100 mmol/L (ref 98–111)
Creatinine, Ser: 1.17 mg/dL (ref 0.61–1.24)
GFR, Estimated: >60 mL/min (ref >60)
Glucose, Bld: 118 mg/dL — ABNORMAL HIGH (ref 70–99)
Potassium: 4.1 mmol/L (ref 3.5–5.1)
Sodium: 131 mmol/L — ABNORMAL LOW (ref 135–145)

## 2021-03-01 MED ORDER — SODIUM CHLORIDE 0.9% FLUSH: 9.0000 mL | INTRAVENOUS | Status: DC | PRN

## 2021-03-01 MED ORDER — MORPHINE SULFATE 1 MG/ML IV SOLN PCA
INTRAVENOUS | Status: DC
  Filled 2021-03-01: qty 30

## 2021-03-01 MED ORDER — DIPHENHYDRAMINE HCL 50 MG/ML IJ SOLN
12.5000 mg | Freq: Four times a day (QID) | INTRAMUSCULAR | Status: DC | PRN
  Administered 2021-03-01 – 2021-03-02 (×2): 12.5 mg via INTRAVENOUS
  Filled 2021-03-01 (×2): qty 1

## 2021-03-01 MED ORDER — NALOXONE HCL 0.4 MG/ML IJ SOLN: 0.4000 mg | INTRAMUSCULAR | Status: DC | PRN

## 2021-03-01 MED ORDER — DIPHENHYDRAMINE HCL 12.5 MG/5ML PO ELIX
12.5000 mg | ORAL_SOLUTION | Freq: Four times a day (QID) | ORAL | Status: DC | PRN
  Filled 2021-03-01: qty 5

## 2021-03-01 NOTE — Progress Notes (Signed)
Patient refused PCA pump when setting up in room. Needed second nurse to verify and patient stated he did not want a malfunctioning machine hooked up to him. States "I would rather sit in pain, too many people are dying in the hospital". Educated patient on parameters placed by doctor in [facility], patient continued to refuse. PRN pain medications will be administered as needed.  Wasted 43ml of morphine with second nurse Sherrilyn Rist, RN.

## 2021-03-01 NOTE — Progress Notes (Addendum)
°  Progress Note    03/01/2021 8:30 AM 2 Days Post-Op  Subjective:  complaining of back and abdominal pain   Vitals:   03/01/21 0410 03/01/21 0740  BP: (!) 126/57 (!) 110/47  Pulse: 69 70  Resp: 15 18  Temp: 98.8 F (37.1 C) 98.8 F (37.1 C)  SpO2: 98% 93%   Physical Exam: Cardiac:  regular Lungs:  non labored Incisions:  right groin with wound vac to suction. Staples intact and well appearing, no swelling or hematoma Extremities:  well perfused and warm with doppler Dp/ PT signals Abdomen:  flat Neurologic: alert and oriented  CBC    Component Value Date/Time   WBC 10.0 03/01/2021 0245   RBC 3.55 (L) 03/01/2021 0245   HGB 11.9 (L) 03/01/2021 0245   HCT 35.8 (L) 03/01/2021 0245   PLT 165 03/01/2021 0245   MCV 100.8 (H) 03/01/2021 0245   MCH 33.5 03/01/2021 0245   MCHC 33.2 03/01/2021 0245   RDW 14.5 03/01/2021 0245   LYMPHSABS 1.2 02/19/2021 1647   MONOABS 0.8 02/10/2021 1647   EOSABS 0.0 02/28/2021 1647   BASOSABS 0.1 02/10/2021 1647    BMET    Component Value Date/Time   NA 131 (L) 03/01/2021 0245   K 4.1 03/01/2021 0245   CL 100 03/01/2021 0245   CO2 24 03/01/2021 0245   GLUCOSE 118 (H) 03/01/2021 0245   BUN 12 03/01/2021 0245   CREATININE 1.17 03/01/2021 0245   CALCIUM 8.4 (L) 03/01/2021 0245   GFRNONAA >60 03/01/2021 0245   GFRAA >60 06/21/2017 0305    INR    Component Value Date/Time   INR 1.1 03/03/2021 1647     Intake/Output Summary (Last 24 hours) at 03/01/2021 0830 Last data filed at 03/01/2021 0741 Gross per 24 hour  Intake 109.72 ml  Output 1175 ml  Net -1065.28 ml     Assessment/Plan:  65 y.o. male is s/p: 1.  Reexploration right groin greater than 30 days 2.  Repair of right common femoral pseudoaneurysm with 8 mm Dacron interposition graft from right limb of aortobifemoral bypass to profunda branches 3.  Ligation of right common femoral artery 4.  Sartorial muscle flap coverage of both right femoral graft 5.  Negative  pressure dressing placement right groin wound  2 Days Post-Op   Right leg remains well perfused with brisk right PT/Dp doppler signals Right groin with wound vac to suction Surgical cultures still pending. Continue broad spectrum abx Continues to have back and abd pain thinks secondary to No BM. Given Colace, Miralax and Ducolax suppository HHRN and OT orders placed Home wound VAC order placed Will need 3x/week wound VAC changes on d/c Plan to change wound VAC Monday 12/26 OOB mobilize as tolerated  DVT prophylaxis:  sq heparin   Graceann Congress, PA-C [facility] [phone] 03/01/2021 8:30 AM  VASCULAR STAFF ADDENDUM: I have independently interviewed and examined the patient. I agree with the above.  Mobilize as able.  PT/OT/OOB. Continue broad-spectrum antibiotics.  Awaiting culture data. Will change VAC on Monday.  Rande Brunt. Lenell Antu, MD [facility] of [facility] Phone Number: [phone] 03/01/2021 11:39 AM '

## 2021-03-01 NOTE — Progress Notes (Signed)
Mobility Specialist Progress Note    03/01/21 1414  Mobility  Activity Refused mobility   Pt resting and said he was cold. Will f/u as schedule permits.   [facility] Mobility Specialist  M.S. Primary Phone: [phone] M.S. Secondary Phone: [phone]

## 2021-03-02 ENCOUNTER — Inpatient Hospital Stay (HOSPITAL_COMMUNITY): Payer: Medicare Other

## 2021-03-02 LAB — RENAL FUNCTION PANEL
Albumin: 2.8 g/dL — ABNORMAL LOW (ref 3.5–5.0)
Anion gap: 8 (ref 5–15)
BUN: 10 mg/dL (ref 8–23)
CO2: 25 mmol/L (ref 22–32)
Calcium: 8.6 mg/dL — ABNORMAL LOW (ref 8.9–10.3)
Chloride: 100 mmol/L (ref 98–111)
Creatinine, Ser: 1.04 mg/dL (ref 0.61–1.24)
GFR, Estimated: >60 mL/min (ref >60)
Glucose, Bld: 117 mg/dL — ABNORMAL HIGH (ref 70–99)
Phosphorus: 2.6 mg/dL (ref 2.5–4.6)
Potassium: 3.7 mmol/L (ref 3.5–5.1)
Sodium: 133 mmol/L — ABNORMAL LOW (ref 135–145)

## 2021-03-02 LAB — AEROBIC CULTURE W GRAM STAIN (SUPERFICIAL SPECIMEN): Culture: NO GROWTH

## 2021-03-02 MED ORDER — KETOROLAC TROMETHAMINE 30 MG/ML IJ SOLN
30.0000 mg | Freq: Four times a day (QID) | INTRAMUSCULAR | Status: AC | PRN
  Administered 2021-03-02 – 2021-03-03 (×3): 30 mg via INTRAVENOUS
  Filled 2021-03-02 (×3): qty 1

## 2021-03-02 NOTE — Progress Notes (Signed)
Mobility Specialist Progress Note    03/02/21 1228  Mobility  Activity Contraindicated/medical hold   RN advised to hold off. Will f/u as schedule permits.   [facility] Mobility Specialist  M.S. Primary Phone: [phone] M.S. Secondary Phone: [phone]

## 2021-03-02 NOTE — Progress Notes (Addendum)
°  Progress Note    03/02/2021 7:46 AM 3 Days Post-Op  Subjective:  severe back pain. Says he hasnt eaten because too much pain   Vitals:   03/01/21 2320 03/02/21 0328  BP: (!) 95/55 115/65  Pulse: 73 81  Resp: 18 18  Temp: 98.6 F (37 C) 98.8 F (37.1 C)  SpO2: 96% 94%   Physical Exam: Cardiac:  regular Lungs:  non labored Incisions:  right groin incision intact, VAC with good seal. Serosanguinous drainage in canister Extremities:  well perfused. Doppler right PT/ DP signals Abdomen:  flat, soft Neurologic: alert and oriented  CBC    Component Value Date/Time   WBC 10.0 03/01/2021 0245   RBC 3.55 (L) 03/01/2021 0245   HGB 11.9 (L) 03/01/2021 0245   HCT 35.8 (L) 03/01/2021 0245   PLT 165 03/01/2021 0245   MCV 100.8 (H) 03/01/2021 0245   MCH 33.5 03/01/2021 0245   MCHC 33.2 03/01/2021 0245   RDW 14.5 03/01/2021 0245   LYMPHSABS 1.2 02/22/2021 1647   MONOABS 0.8 02/11/2021 1647   EOSABS 0.0 02/18/2021 1647   BASOSABS 0.1 02/20/2021 1647    BMET    Component Value Date/Time   NA 133 (L) 03/02/2021 0101   K 3.7 03/02/2021 0101   CL 100 03/02/2021 0101   CO2 25 03/02/2021 0101   GLUCOSE 117 (H) 03/02/2021 0101   BUN 10 03/02/2021 0101   CREATININE 1.04 03/02/2021 0101   CALCIUM 8.6 (L) 03/02/2021 0101   GFRNONAA >60 03/02/2021 0101   GFRAA >60 06/21/2017 0305    INR    Component Value Date/Time   INR 1.1 02/20/2021 1647     Intake/Output Summary (Last 24 hours) at 03/02/2021 0746 Last data filed at 03/02/2021 0600 Gross per 24 hour  Intake 550 ml  Output 1650 ml  Net -1100 ml     Assessment/Plan:  66 y.o. male is s/p  1.  Reexploration right groin greater than 30 days 2.  Repair of right common femoral pseudoaneurysm with 8 mm Dacron interposition graft from right limb of aortobifemoral bypass to profunda branches 3.  Ligation of right common femoral artery 4.  Sartorial muscle flap coverage of both right femoral graft 5.  Negative pressure  dressing placement right groin wound  3 Days Post-Op   Right leg remains well perfused with brisk right PT/Dp doppler signals Right groin with wound vac to suction Surgical cultures still pending. Continue broad spectrum abx Continues to have back and abd pain. Will obtain Xray Refused PCA - see RN note. Will try Toradol 30 mg again today to see if this helps Had small BM yesterday Continue bowel regimen Plan to change wound VAC tomorrow 12/26 Encouraged patient to mobilize    DVT prophylaxis:  sq heparin   Graceann Congress, PA-C [facility] [phone] 03/02/2021 7:46 AM  VASCULAR STAFF ADDENDUM: I have independently interviewed and examined the patient. I agree with the above.   Rande Brunt. Lenell Antu, MD [facility] of [facility] Phone Number: [phone] 03/02/2021 11:21 AM

## 2021-03-02 NOTE — Progress Notes (Signed)
PHARMACIST LIPID MONITORING   Wayne [name] is a 65 y.o. male admitted on Mar 05, 2021 with history of aortobifemoral bypass for claudication.  Pharmacy has been consulted to optimize lipid-lowering therapy with the indication of secondary prevention for clinical ASCVD.  Recent Labs:  Lipid Panel (last 6 months):   Lab Results  Component Value Date   CHOL 119 02/28/2021   TRIG 68 02/28/2021   HDL 72 02/28/2021   CHOLHDL 1.7 02/28/2021   VLDL 14 02/28/2021   LDLCALC 33 02/28/2021    Hepatic function panel (last 6 months):   Lab Results  Component Value Date   AST 28 2021-03-05   ALT 18 03-05-21   ALKPHOS 74 05-Mar-2021   BILITOT 0.6 2021/03/05    SCr (since admission):   Serum creatinine: 1.04 mg/dL 93/81/82 9937 Estimated creatinine clearance: 44.3 mL/min  Current therapy and lipid therapy tolerance Current lipid-lowering therapy: Rosuvastatin 40 mg QD Previous lipid-lowering therapies (if applicable): NA Documented or reported allergies or intolerances to lipid-lowering therapies (if applicable): none  Assessment:   LDL is 33, no change in lipid lowering therapy is needed.  Plan:    Continue Crestor 40 mg daily  Thank you for allowing pharmacy to participate in this patient's care.  Enos Fling, PharmD PGY1 Pharmacy Resident 03/02/2021 10:21 AM Check [email or web address] for unit specific pharmacy number

## 2021-03-03 ENCOUNTER — Inpatient Hospital Stay (HOSPITAL_COMMUNITY): Payer: Medicare Other

## 2021-03-03 LAB — BPAM RBC
Blood Product Expiration Date: 202212312359
Blood Product Expiration Date: 202212312359
Blood Product Expiration Date: 202212312359
Blood Product Expiration Date: 202212312359
Blood Product Expiration Date: 202301102359
Blood Product Expiration Date: 202301112359
Blood Product Expiration Date: 202301112359
Blood Product Expiration Date: 202301112359
ISSUE DATE / TIME: 202212222349
ISSUE DATE / TIME: 202212222349
ISSUE DATE / TIME: 202212241146
ISSUE DATE / TIME: 202212251027
Unit Type and Rh: 6200
Unit Type and Rh: 6200
Unit Type and Rh: 6200
Unit Type and Rh: 6200
Unit Type and Rh: 6200
Unit Type and Rh: 6200
Unit Type and Rh: 6200
Unit Type and Rh: 6200

## 2021-03-03 LAB — TYPE AND SCREEN
ABO/RH(D): A POS
Antibody Screen: NEGATIVE
Unit division: 0
Unit division: 0
Unit division: 0
Unit division: 0
Unit division: 0
Unit division: 0
Unit division: 0
Unit division: 0

## 2021-03-03 LAB — CBC
HCT: 35.1 % — ABNORMAL LOW (ref 39.0–52.0)
Hemoglobin: 11.9 g/dL — ABNORMAL LOW (ref 13.0–17.0)
MCH: 34.3 pg — ABNORMAL HIGH (ref 26.0–34.0)
MCHC: 33.9 g/dL (ref 30.0–36.0)
MCV: 101.2 fL — ABNORMAL HIGH (ref 80.0–100.0)
Platelets: 176 10*3/uL (ref 150–400)
RBC: 3.47 MIL/uL — ABNORMAL LOW (ref 4.22–5.81)
RDW: 14.3 % (ref 11.5–15.5)
WBC: 8.5 10*3/uL (ref 4.0–10.5)
nRBC: 0 % (ref 0.0–0.2)

## 2021-03-03 MED ORDER — DOCUSATE SODIUM 100 MG PO CAPS
200.0000 mg | ORAL_CAPSULE | Freq: Every day | ORAL | Status: DC
  Administered 2021-03-03 – 2021-03-12 (×7): 200 mg via ORAL
  Filled 2021-03-03 (×10): qty 2

## 2021-03-03 NOTE — TOC Progression Note (Addendum)
Transition of Care [facility]) - Progression Note    Patient Details  Name: Wayne [name] MRN: [id number] Date of Birth: [birth year]  [facility] CM/SW Contact  Bess Kinds, RN Phone Number: [phone] 03/03/2021, 2:26 PM  Clinical Narrative:     Attempted to set up [facility] RN for wound vac changes. Unable to find accepting agency.   [facility] declined d/t staffing [facility] declined d/t staffing [facility] - left message [facility] - out of network [facility] - declined d/t staffing [facility] - declined d/t staffing [facility] [place], declined d/t staffing [employer] - declined d/t staffing [phone] - declined d/t staffing [facility] - declined d/t staffing [facility] - declined d/t staffing [facility]) - declined d/t staffing  Can patient go to wound care center or MD office for dressing changes?  Expected Discharge Plan: Home w Home Health Services Barriers to Discharge: Continued Medical Work up  Expected Discharge Plan and Services Expected Discharge Plan: Home w Home Health Services   Discharge Planning Services: CM Consult Post Acute Care Choice: Durable Medical Equipment, Home Health Living arrangements for the past 2 months: Single Family Home                 DME Arranged: Vac DME Agency: [facility] Date DME Agency Contacted: 02/28/21   Representative spoke with at DME Agency: [place] [facility] Arranged: RN           Social Determinants of Health (SDOH) Interventions    Readmission Risk Interventions No flowsheet data found.

## 2021-03-03 NOTE — Progress Notes (Addendum)
Mobility Specialist: Progress Note   03/03/21 1155  Mobility  Activity Transferred:  Bed to chair  Level of Assistance Standby assist, set-up cues, supervision of patient - no hands on  Assistive Device None  Distance Ambulated (ft) 2 ft  Mobility Out of bed to chair with meals  Mobility Response Tolerated well  Mobility performed by Mobility specialist  Bed Position Chair  $Mobility charge 1 Mobility   Pre-Mobility: 108 HR, 100/59 BP, 92% SpO2 During Mobility: 130s-140sHR Post-Mobility: 108 HR, 91% SpO2  Pt reluctant but agreeable to transfer to the chair to eat lunch. Pt independent to sit EOB as well as to stand. After sitting EOB pt's HR increased to 130s-140s bpm but returned to low 100s bpm after transfer. Standby assist for line management, pt had no c/o throughout. Informed pt PT would be coming by to see him later for ambulation. Pt has call bell at his side and family present in the room.   [facility] Luci Bellucci Mobility Specialist Mobility Specialist 4 [place]: [phone] Mobility Specialist 2 [place] and 6 [place]: [phone]

## 2021-03-03 NOTE — Progress Notes (Signed)
Pharmacy Antibiotic Note  Wayne [name] is a 65 y.o. 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  Pharmacy has been consulted for vancomycin and cefepime dosing.  SCr stable overall. WBC trending down. Cultures still pending. Afebrile.   Plan: Continue Vancomycin 1000 mg IV Q36H. Goal AUC 400-550.  Expected AUC 512.  SCr 1.08 (currently stable at 1.04).  Continue Cefepime 2g IV Q12H. Follow-up cultures and ability to narrow therapy.   Height: 5\' 3"  (160 cm) Weight: 43.8 kg (96 lb 9 oz) IBW/kg (Calculated) : 56.9  Temp (24hrs), Avg:98.2 F (36.8 C), Min:97.7 F (36.5 C), Max:98.7 F (37.1 C)  Recent Labs  Lab 03/07/2021 1647 02/18/2021 1909 02/28/21 0315 03/01/21 0245 03/02/21 0101 03/03/21 0932  WBC 8.4  --  12.6* 10.0  --  8.5  CREATININE 1.08  --  0.83 1.17 1.04  --   LATICACIDVEN 1.6 1.1  --   --   --   --     Estimated Creatinine Clearance: 42.7 mL/min (by C-G formula based on SCr of 1.04 mg/dL).    Allergies  Allergen Reactions   Penicillins Itching, Rash and Other (See Comments)    PATIENT HAS HAD A PCN REACTION WITH IMMEDIATE RASH, FACIAL/TONGUE/THROAT SWELLING, SOB, OR LIGHTHEADEDNESS WITH HYPOTENSION:  #  #  #  YES  #  #  #   Has patient had a PCN reaction causing severe rash involving mucus membranes or skin necrosis: No Has patient had a PCN reaction that required hospitalization: No Has patient had a PCN reaction occurring within the last 10 years: No If all of the above answers are "NO", then may proceed with Cephalosporin use.      Thank you for allowing pharmacy to be a part of this patients care.  03/05/21, PharmD, BCPS, BCCCP Clinical Pharmacist Please refer to [facility] for [facility] Pharmacy numbers 03/03/2021 2:56 PM

## 2021-03-03 NOTE — Care Management Important Message (Signed)
Important Message  Patient Details  Name: Wayne [name] MRN: [id number] Date of Birth: 1955-03-15   Medicare Important Message Given:  Yes     Renie Ora 03/03/2021, 10:30 AM

## 2021-03-03 NOTE — Progress Notes (Signed)
Physical Therapy Discharge Patient Details Name: Wayne [name] MRN: [id number] DOB: [birth year] Today's Date: 03/03/2021 Time: 5686-1683 PT Time Calculation (min) (ACUTE ONLY): 14 min  Patient discharged from PT services secondary to goals met and no further PT needs identified.  Please see latest therapy progress note for current level of functioning and progress toward goals.    Progress and discharge plan discussed with patient and/or caregiver: Patient/Caregiver agrees with plan  GP     [name] [facility] 03/03/2021, 1:55 PM

## 2021-03-03 NOTE — Progress Notes (Signed)
Physical Therapy Treatment Patient Details Name: Wayne [name] MRN: [id number] DOB: [birth year] Today's Date: 03/03/2021   History of Present Illness Pt is a 65 y.o. male admitted 02/11/2021 for R groin reexploration, repair of R common femoral pseudoaneurysm, sartorial muscle flap coverage with R groin wound vac placement. PMH includes CHF, COPD, HTN, PVD, pulmonary HTN, arthritis, malnutrition.    PT Comments    Pt moving well when he opts to do so. He readily agreed to ambulation and did well and does not require any skilled PT. He only needed someone to manage his VAC. Will DC from PT due to no skilled needs. Should continue to mobilize with mobility specialist and other staff to carry/manage his VAC. If liberated from lines could ambulate on his own.    Recommendations for follow up therapy are one component of a multi-disciplinary discharge planning process, led by the attending physician.  Recommendations may be updated based on patient status, additional functional criteria and insurance authorization.  Follow Up Recommendations  No PT follow up     Assistance Recommended at Discharge PRN  Equipment Recommendations  None recommended by PT    Recommendations for Other Services       Precautions / Restrictions Precautions Precautions: Other (comment) Precaution Comments: R groin wound vac     Mobility  Bed Mobility               General bed mobility comments: Pt up in chair on arrival and sitting EOB after session.    Transfers Overall transfer level: Modified independent Equipment used: None Transfers: Sit to/from Stand Sit to Stand: Modified independent (Device/Increase time)           General transfer comment: Steady to rise    Ambulation/Gait Ambulation/Gait assistance: Supervision Gait Distance (Feet): 175 Feet Assistive device: None Gait Pattern/deviations: WFL(Within Functional Limits) Gait velocity: decr Gait velocity interpretation: 1.31 -  2.62 ft/sec, indicative of limited community ambulator   General Gait Details: supervision only to carry VAC and heart monitor. Pt with steady gait with 1 standing rest break due to COPD   Stairs             Wheelchair Mobility    Modified Rankin (Stroke Patients Only)       Balance Overall balance assessment: Mild deficits observed, not formally tested                                          Cognition Arousal/Alertness: Awake/alert Behavior During Therapy: WFL for tasks assessed/performed Overall Cognitive Status: Within Functional Limits for tasks assessed                                          Exercises      General Comments General comments (skin integrity, edema, etc.): VSS on RA      Pertinent Vitals/Pain Pain Assessment: Faces Faces Pain Scale: Hurts little more Pain Location: back Pain Descriptors / Indicators: Discomfort Pain Intervention(s): Limited activity within patient's tolerance    Home Living                          Prior Function            PT Goals (current goals can now be found in  the care plan section) Progress towards PT goals: Goals met/education completed, patient discharged from PT    Frequency           PT Plan Other (comment) (DC from PT due to no skilled needs)    Co-evaluation              AM-PAC PT "6 Clicks" Mobility   Outcome Measure  Help needed turning from your back to your side while in a flat bed without using bedrails?: None Help needed moving from lying on your back to sitting on the side of a flat bed without using bedrails?: None Help needed moving to and from a bed to a chair (including a wheelchair)?: None Help needed standing up from a chair using your arms (e.g., wheelchair or bedside chair)?: None Help needed to walk in hospital room?: None Help needed climbing 3-5 steps with a railing? : None 6 Click Score: 24    End of Session    Activity Tolerance: Patient tolerated treatment well Patient left: in bed;with call bell/phone within reach   PT Visit Diagnosis: Other abnormalities of gait and mobility (R26.89)     Time: 1594-7076 PT Time Calculation (min) (ACUTE ONLY): 14 min  Charges:  $Gait Training: 8-22 mins                     [place] Pager [phone] Office [place] 03/03/2021, 1:50 PM

## 2021-03-03 NOTE — Progress Notes (Addendum)
°  Progress Note    03/03/2021 7:16 AM 4 Days Post-Op  Subjective:  back is still hurting, somewhat better than yesterday   Vitals:   03/02/21 2338 03/03/21 0407  BP: 106/60 119/67  Pulse: 76 74  Resp: 20 13  Temp: 97.9 F (36.6 C) 98.4 F (36.9 C)  SpO2: 94% 99%   Physical Exam: Cardiac:  regular Lungs:  non labored Incisions:  right groin incision with staples intact. Some venous bleeding when VAC initially removed. No further bleeding or drainage on compression around area. Wound well appearing. VAC reapplied  Extremities:  well perfused and warm. Doppler right PT/ Dp signals Abdomen:  flat, soft, non distended Neurologic: alert and oriented  CBC    Component Value Date/Time   WBC 10.0 03/01/2021 0245   RBC 3.55 (L) 03/01/2021 0245   HGB 11.9 (L) 03/01/2021 0245   HCT 35.8 (L) 03/01/2021 0245   PLT 165 03/01/2021 0245   MCV 100.8 (H) 03/01/2021 0245   MCH 33.5 03/01/2021 0245   MCHC 33.2 03/01/2021 0245   RDW 14.5 03/01/2021 0245   LYMPHSABS 1.2 02/08/2021 1647   MONOABS 0.8 02/25/2021 1647   EOSABS 0.0 02/12/2021 1647   BASOSABS 0.1 02/26/2021 1647    BMET    Component Value Date/Time   NA 133 (L) 03/02/2021 0101   K 3.7 03/02/2021 0101   CL 100 03/02/2021 0101   CO2 25 03/02/2021 0101   GLUCOSE 117 (H) 03/02/2021 0101   BUN 10 03/02/2021 0101   CREATININE 1.04 03/02/2021 0101   CALCIUM 8.6 (L) 03/02/2021 0101   GFRNONAA >60 03/02/2021 0101   GFRAA >60 06/21/2017 0305    INR    Component Value Date/Time   INR 1.1 03/08/2021 1647     Intake/Output Summary (Last 24 hours) at 03/03/2021 0716 Last data filed at 03/03/2021 0600 Gross per 24 hour  Intake 1162.84 ml  Output 1000 ml  Net 162.84 ml     Assessment/Plan:  65 y.o. male is s/p 1.  Reexploration right groin greater than 30 days 2.  Repair of right common femoral pseudoaneurysm with 8 mm Dacron interposition graft from right limb of aortobifemoral bypass to profunda branches 3.   Ligation of right common femoral artery 4.  Sartorial muscle flap coverage of both right femoral graft 5.  Negative pressure dressing placement right groin wound   4 Days Post-Op   Right groin incision well appearing. VAC changed Will get pt on MWF schedule per [facility] RN RLE well perfused with doppler PT/DP signals Try to wean off IV pain medication KUB and Lumbar Xray just showing large stool burden Continue bowel regimen Encouraged oob to chair and mobilization   DVT prophylaxis:  sq heparin   Graceann Congress, PA-C [facility] [phone] 03/03/2021 7:16 AM  VASCULAR STAFF ADDENDUM: I have independently interviewed and examined the patient. I agree with the above.  Unremarkable KUB / Lumbar XR yesterday. Will check CT to evaluate RP for hematoma or other cause of persistent back pain.  Rande Brunt. Lenell Antu, MD [facility] of [facility] Phone Number: [phone] 03/03/2021 9:27 AM

## 2021-03-04 LAB — CULTURE, BLOOD (ROUTINE X 2)
Culture: NO GROWTH
Culture: NO GROWTH
Special Requests: ADEQUATE
Special Requests: ADEQUATE

## 2021-03-04 MED ORDER — MAGNESIUM HYDROXIDE 400 MG/5ML PO SUSP
30.0000 mL | Freq: Every day | ORAL | Status: DC
  Administered 2021-03-04 – 2021-03-13 (×6): 30 mL via ORAL
  Filled 2021-03-04 (×8): qty 30

## 2021-03-04 NOTE — Progress Notes (Signed)
Patient setting on B.S.C. trying to have B.M. H.R. going up 120-140 S.T. when back to bed H. R. Came down to 90-116 . Patient voiced no complaints. Cont. To monitor patient and rhythm. Lupita Leash R.N. aware.

## 2021-03-04 NOTE — Progress Notes (Signed)
Occupational Therapy Treatment Patient Details Name: Wayne [name] MRN: [id number] DOB: Dec 05, 1955 Today's Date: 03/04/2021   History of present illness Pt is a 65 y.o. male admitted 02/12/2021 for R groin reexploration, repair of R common femoral pseudoaneurysm, sartorial muscle flap coverage with R groin wound vac placement. PMH includes CHF, COPD, HTN, PVD, pulmonary HTN, arthritis, malnutrition.   OT comments  Pt demonstrated ability to toilet, groom in standing and don and doff socks with set up to supervision. Educated in benefits of mobility, ambulated in room pushing IV pole with OT managing wound vac. Off 02 during mobility, but difficulty getting accurate reading with sensor due to pt's cold hands. Pt not likely to need any follow up OT upon discharge, progressing well.    Recommendations for follow up therapy are one component of a multi-disciplinary discharge planning process, led by the attending physician.  Recommendations may be updated based on patient status, additional functional criteria and insurance authorization.    Follow Up Recommendations  No OT follow up    Assistance Recommended at Discharge Intermittent Supervision/Assistance  Equipment Recommendations  None recommended by OT    Recommendations for Other Services      Precautions / Restrictions Precautions Precautions: Other (comment) Precaution Comments: R groin wound vac       Mobility Bed Mobility               General bed mobility comments: seated EOB at beginning and end of session, declined sitting in chair    Transfers Overall transfer level: Modified independent Equipment used: None                     Balance Overall balance assessment: Mild deficits observed, not formally tested   Sitting balance-Leahy Scale: Normal Sitting balance - Comments: no LOB donning socks     Standing balance-Leahy Scale: Good                             ADL either performed or  assessed with clinical judgement   ADL Overall ADL's : Needs assistance/impaired Eating/Feeding: Set up;Sitting Eating/Feeding Details (indicate cue type and reason): requested assistance to open containers due to 02 sensor on R first finger Grooming: Wash/dry hands;Standing;Supervision/safety       Lower Body Bathing: Set up;Sitting/lateral leans       Lower Body Dressing: Set up;Sitting/lateral leans   Toilet Transfer: Supervision/safety;Stand-pivot;BSC/3in1   Toileting- Clothing Manipulation and Hygiene: Supervision/safety;Sit to/from stand         General ADL Comments: Pt reports a sedentary lifestyle. Educated in benefits of mobility for back pain, to maintain strength and endurance and for bowel function. Pt then agreed to walk in room.    Extremity/Trunk Assessment              Vision       Perception     Praxis      Cognition Arousal/Alertness: Awake/alert Behavior During Therapy: WFL for tasks assessed/performed Overall Cognitive Status: Within Functional Limits for tasks assessed                                            Exercises     Shoulder Instructions       General Comments      Pertinent Vitals/ Pain       Pain Assessment:  Faces Faces Pain Scale: Hurts a little bit Pain Location: back Pain Descriptors / Indicators: Discomfort Pain Intervention(s): Repositioned;Monitored during session  Home Living                                          Prior Functioning/Environment              Frequency           Progress Toward Goals  OT Goals(current goals can now be found in the care plan section)  Progress towards OT goals: Progressing toward goals  Acute Rehab OT Goals OT Goal Formulation: With patient Time For Goal Achievement: 03-23-21 Potential to Achieve Goals: Good  Plan Discharge plan needs to be updated    Co-evaluation                 AM-PAC OT "6 Clicks" Daily  Activity     Outcome Measure   Help from another person eating meals?: A Little Help from another person taking care of personal grooming?: A Little Help from another person toileting, which includes using toliet, bedpan, or urinal?: A Little Help from another person bathing (including washing, rinsing, drying)?: A Little Help from another person to put on and taking off regular upper body clothing?: None Help from another person to put on and taking off regular lower body clothing?: A Little 6 Click Score: 19    End of Session    OT Visit Diagnosis: Muscle weakness (generalized) (M62.81)   Activity Tolerance Patient tolerated treatment well   Patient Left in bed;with call bell/phone within reach;with bed alarm set   Nurse Communication          Time: 1000-1025 OT Time Calculation (min): 25 min  Charges: OT General Charges $OT Visit: 1 Visit OT Treatments $Self Care/Home Management : 23-37 mins  Martie Round, OTR/L Acute Rehabilitation Services Pager: [phone] Office: [phone]  Evern Bio 03/04/2021, 10:33 AM

## 2021-03-04 NOTE — Progress Notes (Addendum)
[facility] [place]  Subjective  - No new complaints.  Continues to have numbness over left thigh since surgery.   Objective (!) 90/58 88 (!) 97.4 F (36.3 C) (Oral) 20 95%  Intake/Output Summary (Last 24 hours) at 03/04/2021 0748 Last data filed at 03/04/2021 0539 Gross per 24 hour  Intake 940 ml  Output 1100 ml  Net -160 ml    Doppler signals intact DP/PT B Right groin with wound vac to suction Tachycardia with hypotension. HGB 11.9 03/03/21 Vac Op 50 cc bloody drainage last 24 hours.  Vac change MWF.    Assessment/Planning: POD # 5  65 y.o. male is s/p 1.  Reexploration right groin greater than 30 days 2.  Repair of right common femoral pseudoaneurysm with 8 mm Dacron interposition graft from right limb of aortobifemoral bypass to profunda branches 3.  Ligation of right common femoral artery 4.  Sartorial muscle flap coverage of both right femoral graft 5.  Negative pressure dressing placement right groin wound  Asymptomatic Hypotension.  No dizziness with stable HGB 11.9.  HR 90+ BP systolic 90's Will continue to observe Repeat cbc and Bmet in am. He has had small BM's and is refusing further bowel intervention Urine OP stable with Cr 1.04 WNL   ABSCESS   Special Requests RIGHT GROIN   Gram Stain FEW WBC PRESENT, PREDOMINANTLY MONONUCLEAR  RARE GRAM POSITIVE COCCI  RARE GRAM NEGATIVE RODS   Culture NO GROWTH 3 DAYS NO ANAEROBES ISOLATED; CULTURE IN PROGRESS FOR 5 DAYS  Performed at [facility], [address] [place], [place] [address]   Report Status PENDING    On IV antibiotics   CT  IMPRESSION: 1. No evidence of retroperitoneal or pelvic hematoma 2. Interval bypass grafting of right femoral pseudoaneurysm, the patency of which is not evaluated on this noncontrast examination. Overlying sartorius muscle flap coverage of right groin. 3. Sequela of previous aorto bi femoral bypass grafting with nonspecific ill-defined  stranding about the bypass graft material, similar to CTA runoff performed 02/21/2021. The patency of the bypass graft is not evaluated on this noncontrast examination. 4. Large colonic stool burden without evidence of enteric obstruction. 5. Aortic Atherosclerosis (ICD10-I70.0).    Wayne [name] 03/04/2021 7:48 AM --  Laboratory Lab Results: Recent Labs    03/03/21 0932  WBC 8.5  HGB 11.9*  HCT 35.1*  PLT 176   BMET Recent Labs    03/02/21 0101  NA 133*  K 3.7  CL 100  CO2 25  GLUCOSE 117*  BUN 10  CREATININE 1.04  CALCIUM 8.6*    COAG Lab Results  Component Value Date   INR 1.1 02/25/2021   INR 1.21 06/19/2017   INR 1.19 06/01/2017   No results found for: PTT   VASCULAR STAFF ADDENDUM: I have independently interviewed and examined the patient. I agree with the above.  CT unremarkable - no clear cause for back pain. Mobilize as able.  Continue VAC to groin. Will ask ID for recommendations once culture data returns.  Rande Brunt. Lenell Antu, MD [facility] Phone Number: [phone] 03/04/2021 8:07 AM

## 2021-03-04 NOTE — Plan of Care (Signed)

## 2021-03-04 NOTE — Progress Notes (Signed)
Mobility Specialist Progress Note:   03/04/21 1327  Mobility  Activity Refused mobility   Pt said he already got up today and is cold so not wanting to ambulate right now. Will try and follow-up later.   [facility] Designer, jewellery Phone [phone] Secondary Phone [phone]

## 2021-03-05 ENCOUNTER — Inpatient Hospital Stay (HOSPITAL_COMMUNITY): Payer: Medicare Other

## 2021-03-05 LAB — BASIC METABOLIC PANEL
Anion gap: 7 (ref 5–15)
BUN: 8 mg/dL (ref 8–23)
CO2: 22 mmol/L (ref 22–32)
Calcium: 8 mg/dL — ABNORMAL LOW (ref 8.9–10.3)
Chloride: 105 mmol/L (ref 98–111)
Creatinine, Ser: 0.89 mg/dL (ref 0.61–1.24)
GFR, Estimated: >60 mL/min (ref >60)
Glucose, Bld: 85 mg/dL (ref 70–99)
Potassium: 3.5 mmol/L (ref 3.5–5.1)
Sodium: 134 mmol/L — ABNORMAL LOW (ref 135–145)

## 2021-03-05 LAB — AEROBIC/ANAEROBIC CULTURE W GRAM STAIN (SURGICAL/DEEP WOUND): Culture: NO GROWTH

## 2021-03-05 LAB — CBC
HCT: 27.7 % — ABNORMAL LOW (ref 39.0–52.0)
Hemoglobin: 9.6 g/dL — ABNORMAL LOW (ref 13.0–17.0)
MCH: 34.4 pg — ABNORMAL HIGH (ref 26.0–34.0)
MCHC: 34.7 g/dL (ref 30.0–36.0)
MCV: 99.3 fL (ref 80.0–100.0)
Platelets: 181 10*3/uL (ref 150–400)
RBC: 2.79 MIL/uL — ABNORMAL LOW (ref 4.22–5.81)
RDW: 14.3 % (ref 11.5–15.5)
WBC: 14.3 10*3/uL — ABNORMAL HIGH (ref 4.0–10.5)
nRBC: 0 % (ref 0.0–0.2)

## 2021-03-05 LAB — VANCOMYCIN, TROUGH: Vancomycin Tr: 6 ug/mL — ABNORMAL LOW (ref 15–20)

## 2021-03-05 LAB — ANAEROBIC CULTURE W GRAM STAIN

## 2021-03-05 NOTE — Consult Note (Signed)
WOC Nurse Consult Note: Reason for Consult:New consult for NPWT (VAC) changes.  MD and PA notes indicate that they will change VAC today.  I page vascular surgery service and they confirm they will change today. Supplies are at bedside.  Will stand by if needed after first post op dressing change today.  Wound type:Right groin surgical wound Pressure Injury POA: NA Will stand by as needed going forward  Maple Hudson MSN, RN, FNP-BC CWON Wound, Ostomy, Continence Nurse Pager [phone]

## 2021-03-05 NOTE — Progress Notes (Signed)
° °  Right groin wound 3 cm x 3cm x 1 cm deep Wound vac change MWF  Mosetta Pigeon PA-C

## 2021-03-05 NOTE — Progress Notes (Addendum)
[facility] [place]  Subjective  - States he feels weak   Objective (!) 101/51 86 98.2 F (36.8 C) (Oral) 16 97%  Intake/Output Summary (Last 24 hours) at 03/05/2021 0729 Last data filed at 03/05/2021 0541 Gross per 24 hour  Intake 1482 ml  Output 2010 ml  Net -528 ml    Wound vac output frank blood appearance > 800 cc total with drop in HGB 11.9 now 9.6 Hypotensive with HR in 100's    Assessment/Planning: POD # 6  65 y.o. male is s/p 1.  Reexploration right groin greater than 30 days 2.  Repair of right common femoral pseudoaneurysm with 8 mm Dacron interposition graft from right limb of aortobifemoral bypass to profunda branches 3.  Ligation of right common femoral artery 4.  Sartorial muscle flap coverage of both right femoral graft 5.  Negative pressure dressing placement right groin wound  Symptomatic hypotension 101/51 with blood loss anemia now 9.6 plan to change wound vac today with Dr. Lenell Antu. Good urine OP > 1900 cc last 24 hours Negative net fluid since admission.   Leukocytosis 14.3 IS to bedside, on 2L [place] O2 SAT 97 %   Mosetta Pigeon 03/05/2021 7:29 AM --  Laboratory Lab Results: Recent Labs    03/03/21 0932 03/05/21 0106  WBC 8.5 14.3*  HGB 11.9* 9.6*  HCT 35.1* 27.7*  PLT 176 181   BMET Recent Labs    03/05/21 0106  NA 134*  K 3.5  CL 105  CO2 22  GLUCOSE 85  BUN 8  CREATININE 0.89  CALCIUM 8.0*    COAG Lab Results  Component Value Date   INR 1.1 02/19/2021   INR 1.21 06/19/2017   INR 1.19 06/01/2017   No results found for: PTT   VASCULAR STAFF ADDENDUM: I have independently interviewed and examined the patient. I agree with the above.  Looks good. VAC changed at bedside. Healing appropriately. New leukocytosis. Unclear source. Remains on broad spectrum Abx. No culture data as yet.  Check UA, CXR.  Will ask ID to see once culture data is finalized.  Rande Brunt. Lenell Antu, MD [facility] Phone Number: [phone] 03/05/2021 9:58 AM

## 2021-03-05 NOTE — Plan of Care (Signed)
  Problem: Education: Goal: Knowledge of General Education information will improve Description: Including pain rating scale, medication(s)/side effects and non-pharmacologic comfort measures Outcome: Progressing   Problem: Health Behavior/Discharge Planning: Goal: Ability to manage health-related needs will improve Outcome: Progressing   Problem: Clinical Measurements: Goal: Respiratory complications will improve Outcome: Progressing   Problem: Activity: Goal: Risk for activity intolerance will decrease Outcome: Progressing   Problem: Nutrition: Goal: Adequate nutrition will be maintained Outcome: Progressing   Problem: Coping: Goal: Level of anxiety will decrease Outcome: Progressing   Problem: Elimination: Goal: Will not experience complications related to bowel motility Outcome: Progressing   Problem: Pain Managment: Goal: General experience of comfort will improve Outcome: Progressing   

## 2021-03-05 NOTE — Progress Notes (Signed)
Mobility Specialist: Progress Note   03/05/21 1447  Mobility  Activity Ambulated in hall  Level of Assistance Contact guard assist, steadying assist  Assistive Device None  Distance Ambulated (ft) 230 ft  Mobility Ambulated with assistance in hallway  Mobility Response Tolerated well  Mobility performed by Mobility specialist  $Mobility charge 1 Mobility   During Mobility: 117 HR Post-Mobility: 98 HR, 103/59 BP, 91% SpO2  Pt ambulated on RA and maintained >90% throughout with some SOB halfway through ambulation. Pt back to bed after walk with call bell in his lap. [place] placed back on pt after returning to bed for comfort.  [facility] Kyelle Urbas Mobility Specialist Mobility Specialist [address] [place]: [phone] Mobility Specialist 2 [place] and [address] [place]: [phone]

## 2021-03-06 ENCOUNTER — Other Ambulatory Visit (HOSPITAL_COMMUNITY): Payer: Self-pay

## 2021-03-06 DIAGNOSIS — T827XXA Infection and inflammatory reaction due to other cardiac and vascular devices, implants and grafts, initial encounter: Secondary | ICD-10-CM | POA: Diagnosis not present

## 2021-03-06 DIAGNOSIS — I998 Other disorder of circulatory system: Secondary | ICD-10-CM

## 2021-03-06 DIAGNOSIS — I7090 Unspecified atherosclerosis: Secondary | ICD-10-CM

## 2021-03-06 DIAGNOSIS — I724 Aneurysm of artery of lower extremity: Secondary | ICD-10-CM | POA: Diagnosis not present

## 2021-03-06 LAB — URINALYSIS, COMPLETE (UACMP) WITH MICROSCOPIC
Bilirubin Urine: NEGATIVE
Glucose, UA: NEGATIVE mg/dL
Ketones, ur: 5 mg/dL — AB
Leukocytes,Ua: NEGATIVE
Nitrite: NEGATIVE
Protein, ur: NEGATIVE mg/dL
Specific Gravity, Urine: 1.009 (ref 1.005–1.030)
pH: 6 (ref 5.0–8.0)

## 2021-03-06 LAB — CBC
HCT: 30.6 % — ABNORMAL LOW (ref 39.0–52.0)
Hemoglobin: 10.2 g/dL — ABNORMAL LOW (ref 13.0–17.0)
MCH: 34.2 pg — ABNORMAL HIGH (ref 26.0–34.0)
MCHC: 33.3 g/dL (ref 30.0–36.0)
MCV: 102.7 fL — ABNORMAL HIGH (ref 80.0–100.0)
Platelets: 235 K/uL (ref 150–400)
RBC: 2.98 MIL/uL — ABNORMAL LOW (ref 4.22–5.81)
RDW: 14.7 % (ref 11.5–15.5)
WBC: 15.7 K/uL — ABNORMAL HIGH (ref 4.0–10.5)
nRBC: 0 % (ref 0.0–0.2)

## 2021-03-06 LAB — VANCOMYCIN, PEAK: Vancomycin Pk: 29 ug/mL — ABNORMAL LOW (ref 30–40)

## 2021-03-06 MED ORDER — EPINEPHRINE 0.3 MG/0.3ML IJ SOAJ
0.3000 mg | Freq: Once | INTRAMUSCULAR | Status: DC | PRN
  Filled 2021-03-06: qty 0.6

## 2021-03-06 MED ORDER — VANCOMYCIN HCL IN DEXTROSE 1-5 GM/200ML-% IV SOLN
1000.0000 mg | INTRAVENOUS | Status: DC
  Administered 2021-03-06: 23:00:00 1000 mg via INTRAVENOUS
  Filled 2021-03-06: qty 200

## 2021-03-06 MED ORDER — AMOXICILLIN 500 MG PO CAPS
500.0000 mg | ORAL_CAPSULE | Freq: Once | ORAL | Status: AC
  Administered 2021-03-06: 15:00:00 500 mg via ORAL
  Filled 2021-03-06: qty 1

## 2021-03-06 MED ORDER — DIPHENHYDRAMINE HCL 50 MG/ML IJ SOLN: 25.0000 mg | Freq: Once | INTRAMUSCULAR | Status: DC | PRN

## 2021-03-06 NOTE — Progress Notes (Addendum)
Antimicrobial Stewardship Pharmacy Note:  PCN allergy clarification- amoxicillin test dose  ID Pharmacy and Consult team discussed with patient about documented penicillin allergy. He reports that this reaction was in his 35s when he was prescribed an antibiotic by his dentist. He developed itching during this encounter. He states that he has not tried to take penicillin since this event. We discussed the possibility of the oral amoxicillin challenge and if he would be willing to try this since it has been >10 years ago. The patient agreed to try the amoxicillin oral challenge. I discussed with RN to monitor patient closely after administration. Will update on administered and monitored.  Patient tolerated oral amoxicillin well. No itching or rash reported. Wayne [name] states that he felt fine after administration. Will remove allergy from patient profile.   Shirlee More, PharmD PGY2 Infectious Diseases Pharmacy Resident   Please check [email or web address] for unit-specific pharmacy phone numbers

## 2021-03-06 NOTE — Progress Notes (Signed)
Provided pt Incentive spirometer and educated pt its benefit and how to use it correctly. Pt verbalized understanding and demonstrating it correctly.  Lawson Radar, RN

## 2021-03-06 NOTE — Consult Note (Signed)
[facility]    Date of Admission:  02/27/2021     Total days of antibiotics 7   Vancomycin   Cefepime               Reason for Consult: vascular graft infection     Referring Provider: Randie Heinz Primary Care Provider: Clemencia Course, PA-C   Assessment: Wayne [name] is a 65 y.o. male with history of COPD, CAD, CHF, tobacco use, and chronic claudication associated with AOBF graft that was placed in 2019.   Taken to OR for emergent management of totally occluded right femoral arterial graft / critical limb ischemia. Two large pseudoaneurysms noted with significant thrombus material and turbid appearing fluid - Wayne [name] reports that the site has had some intermittent drainage for the last year and itching to these two bulged areas over the graft. Devitalized graft was excised to healthy appearing graft material and new graft material placed to restore blood flow. Intraoperative gram stain + for GPCs on multiple specimens and GNRs on one. Unfortunately there has been no growth from any specimen.   We spent a lot of time discussing the possibility of a PICC line for Wayne [name] at home - though it was quickly realized he would not be an ideal candidate to safely and reliably manage this without better home support / care outside of what a home health nurse could provide. Will for now continue current antibiotics while we assess amoxicillin challenge to construct a good broad oral regimen. Given no systemic illness/bacteremia, completed surgical management and now 8d of IV therapy seems we are at a good point to transition to PO.   Regarding penicillin allergy - after long discussion he was agreeable to trial amoxicillin PO challenge. With GPCs and chronic infection would want to ensure we have enterococcus covered which is difficult to do easily without an expensive option with more toxic side effects. Will look into linezolid or tedezolid paired with  cefdinir for acute management with hopes we can transition to augmentin for chronic suppression to get through 6 months of treatment given retained graft component.    Plan: Continue IV vanc/cefepime for now Amoxicillin test dose to assess possible allergy Looking into options for longer course of oral treatment and suppression thereafter.      Principal Problem:   Femoral artery aneurysm (HCC) Active Problems:   Ischemia of extremity    amoxicillin  500 mg Oral Once   aspirin EC  81 mg Oral Daily   docusate sodium  200 mg Oral Daily   fluticasone furoate-vilanterol  1 puff Inhalation Daily   And   umeclidinium bromide  1 puff Inhalation Daily   furosemide  20 mg Oral Daily   heparin  5,000 Units Subcutaneous Q8H   magnesium hydroxide  30 mL Oral Daily   multivitamin with minerals  1 tablet Oral Daily   nicotine  7 mg Transdermal Daily   pantoprazole  40 mg Oral Daily   rosuvastatin  40 mg Oral Daily    HPI: Wayne [name] is a 65 y.o. male admitted from home for management over right leg numbness and pain.   Original graft was placed in March 2019 with Dr. Imogene Burn. Reported that he has for the last year now had itching and some drainage from this site.  Described that he had pretty sudden onset pain and numbness of the RLE. He also states he reported a pulsation  sensation to the chronic nodules in the right groin that he has not experienced before. Found to have critical limb ischemia with pseudoanurysms associated with chronically claudicated Rt femoral bypass graft.  In ER noted to have hypoxia, tachypnea and tachycardia - recovered on oxygen. CT PE study without any PE evidence to explain pulmonary symptoms. He currently is still maintained on Oxygen but has no respiratory complaints at rest. CTA revealed total arterial occlusion of AOBF graft with pseudoaneurysms. Started on heparin and taken to OR for urgent operative repair with Dr. Randie Heinz.   Operative Note: 02/28/21 Two  large areas of pseudoaneurysmal degeneration with complete dehiscence of the graft from the common femoral artery. The right limb of the graft was trimmed back to healthy appearing graft and interposition graft was placed from the limb down to the 2 profunda branches after endarterectomy was performed. There was some turbid appearing fluid in the pseudoaneurysms amongst a large thrombus burden.   Wayne [name] also notes a history of penicillin reaction in his 87s when a dentist gave him some penicillin. He also recalls a hospitalization in 2017 for shortness of breath though he was not sure if this was due to amoxicillin or percocet or something else.  He has done well with cephalosporins thusfar this admission. His leg feels much better and only a little soreness from the incision/ vac.   He states he lives by himself and does not have great access to daily help from family/friends. PT and OT following and so far without any recommendations for any follow up rehab or home PT.    Review of Systems: Review of Systems  Constitutional:  Negative for chills, fever, malaise/fatigue and weight loss.  HENT:  Negative for sore throat.   Respiratory:  Negative for cough and sputum production.   Cardiovascular:  Negative for chest pain and leg swelling.  Gastrointestinal:  Negative for abdominal pain, diarrhea and vomiting.  Genitourinary:  Negative for dysuria and flank pain.  Musculoskeletal:  Negative for joint pain (Rt groin discomfort with VAC), myalgias and neck pain.  Skin:  Negative for rash.  Neurological:  Negative for dizziness, tingling and headaches.  Psychiatric/Behavioral:  Negative for depression and substance abuse. The patient is not nervous/anxious and does not have insomnia.    Past Medical History:  Diagnosis Date   Abnormal pulmonary finding    a. preadmission note indicates pt followed by Dr. Bethanie Dicker for CHF, lung mass, COPD, pulmonary mycosis with plan for 3 mo f/u with CT  by 03/2017 note.   Alcohol abuse    Anemia    Arthritis    "right hip; lower back" (05/11/2017)   Chronic systolic CHF (congestive heart failure) (HCC)    a. EF 20-25% by echo 2017. b. 40-45% by echo in 01/2017.   COPD (chronic obstructive pulmonary disease) (HCC)    Dyspnea    GERD (gastroesophageal reflux disease)    History of blood transfusion 2004   "related to bleeding stomach ulcerts"   History of stomach ulcers 2004   Hypercholesterolemia    Hypertension    Malnutrition (HCC)    Moderate mitral regurgitation    Myocardial infarction [facility]) 2004   "think it was bleeding ulcers; not a heart attack" (05/11/2017), pt denies heart cath   Peripheral vascular disease (HCC)    Pre-diabetes    Pulmonary hypertension (HCC)    a. severe pulm HTN by echo 01/2017.   Tricuspid regurgitation 2017    Social History   Tobacco Use  Smoking status: Every Day    Packs/day: 0.25    Years: 50.00    Pack years: 12.50    Types: Cigarettes   Smokeless tobacco: Never  Vaping Use   Vaping Use: Never used  Substance Use Topics   Alcohol use: Yes    Alcohol/week: 8.0 standard drinks    Types: 8 Cans of beer per week    Comment: occ   Drug use: No    Family History  Problem Relation Age of Onset   Hypertension Sister    Hyperlipidemia Sister    Diabetes Sister    Peripheral Artery Disease Father    Allergies  Allergen Reactions   Penicillins Itching, Rash and Other (See Comments)    PATIENT HAS HAD A PCN REACTION WITH IMMEDIATE RASH, FACIAL/TONGUE/THROAT SWELLING, SOB, OR LIGHTHEADEDNESS WITH HYPOTENSION:  #  #  #  YES  #  #  #   Has patient had a PCN reaction causing severe rash involving mucus membranes or skin necrosis: No Has patient had a PCN reaction that required hospitalization: No Has patient had a PCN reaction occurring within the last 10 years: No If all of the above answers are "NO", then may proceed with Cephalosporin use.     OBJECTIVE: Blood pressure (!) 94/59,  pulse 88, temperature 98.5 F (36.9 C), temperature source Oral, resp. rate 20, height 5\' 3"  (1.6 m), weight 43.8 kg, SpO2 100 %.  Physical Exam Vitals reviewed.  Constitutional:      Appearance: He is well-developed.     Comments: Resting comfortably in bed in no distress.   HENT:     Mouth/Throat:     Dentition: Normal dentition. No dental abscesses.  Cardiovascular:     Rate and Rhythm: Normal rate and regular rhythm.     Heart sounds: Normal heart sounds.  Pulmonary:     Effort: Pulmonary effort is normal.     Breath sounds: Normal breath sounds.     Comments: Wearing  for low flow oxygen.  Abdominal:     General: There is no distension.     Palpations: Abdomen is soft.     Tenderness: There is no abdominal tenderness.  Musculoskeletal:        General: Tenderness (Rt groin with surgical incision) present. No swelling.  Lymphadenopathy:     Cervical: No cervical adenopathy.  Skin:    General: Skin is warm and dry.     Findings: No rash.  Neurological:     Mental Status: He is alert and oriented to person, place, and time.  Psychiatric:        Judgment: Judgment normal.     Comments: Pleasant     Lab Results Lab Results  Component Value Date   WBC 15.7 (H) 03/06/2021   HGB 10.2 (L) 03/06/2021   HCT 30.6 (L) 03/06/2021   MCV 102.7 (H) 03/06/2021   PLT 235 03/06/2021    Lab Results  Component Value Date   CREATININE 0.89 03/05/2021   BUN 8 03/05/2021   NA 134 (L) 03/05/2021   K 3.5 03/05/2021   CL 105 03/05/2021   CO2 22 03/05/2021    Lab Results  Component Value Date   ALT 18 02/24/2021   AST 28 02/08/2021   ALKPHOS 74 03/01/2021   BILITOT 0.6 03/01/2021     Microbiology: Recent Results (from the past 240 hour(s))  Blood culture (routine x 2)     Status: None   Collection Time: 02/26/2021  4:45 PM  Specimen: BLOOD LEFT FOREARM  Result Value Ref Range Status   Specimen Description   Final    BLOOD LEFT FOREARM Performed at [facility],  [address] [place], [place] [address]    Special Requests   Final    BOTTLES DRAWN AEROBIC AND ANAEROBIC Blood Culture adequate volume Performed at [facility], [address] [place], [place] [address]    Culture   Final    NO GROWTH 5 DAYS Performed at [facility], [address], [place], [place] [address]    Report Status 03/04/2021 FINAL  Final  Resp Panel by RT-PCR (Flu A&B, Covid) Nasopharyngeal Swab     Status: None   Collection Time: 03/05/2021  4:46 PM   Specimen: Nasopharyngeal Swab; Nasopharyngeal(NP) swabs in vial transport medium  Result Value Ref Range Status   SARS Coronavirus 2 by RT PCR NEGATIVE NEGATIVE Final    Comment: (NOTE) SARS-CoV-2 target nucleic acids are NOT DETECTED.  The SARS-CoV-2 RNA is generally detectable in upper respiratory specimens during the acute phase of infection. The lowest concentration of SARS-CoV-2 viral copies this assay can detect is 138 copies/mL. A negative result does not preclude SARS-Cov-2 infection and should not be used as the sole basis for treatment or other patient management decisions. A negative result may occur with  improper specimen collection/handling, submission of specimen other than nasopharyngeal swab, presence of viral mutation(s) within the areas targeted by this assay, and inadequate number of viral copies(<138 copies/mL). A negative result must be combined with clinical observations, patient history, and epidemiological information. The expected result is Negative.  Fact Sheet for Patients:  BloggerCourse.com  Fact Sheet for Healthcare Providers:  SeriousBroker.it  This test is no t yet approved or cleared by the [place] FDA and  has been authorized for detection and/or diagnosis of SARS-CoV-2 by FDA under an Emergency Use Authorization (EUA). This EUA will remain  in effect (meaning this test can be used) for the duration of  the COVID-19 declaration under Section 564(b)(1) of the Act, 21 U.S.C.section 360bbb-3(b)(1), unless the authorization is terminated  or revoked sooner.       Influenza A by PCR NEGATIVE NEGATIVE Final   Influenza B by PCR NEGATIVE NEGATIVE Final    Comment: (NOTE) The Xpert Xpress SARS-CoV-2/FLU/RSV plus assay is intended as an aid in the diagnosis of influenza from Nasopharyngeal swab specimens and should not be used as a sole basis for treatment. Nasal washings and aspirates are unacceptable for Xpert Xpress SARS-CoV-2/FLU/RSV testing.  Fact Sheet for Patients: BloggerCourse.com  Fact Sheet for Healthcare Providers: SeriousBroker.it  This test is not yet approved or cleared by the [place] FDA and has been authorized for detection and/or diagnosis of SARS-CoV-2 by FDA under an Emergency Use Authorization (EUA). This EUA will remain in effect (meaning this test can be used) for the duration of the COVID-19 declaration under Section 564(b)(1) of the Act, 21 U.S.C. section 360bbb-3(b)(1), unless the authorization is terminated or revoked.  Performed at [facility], [address] [place], [place] [address]   Blood culture (routine x 2)     Status: None   Collection Time: 03/07/2021  4:50 PM   Specimen: BLOOD RIGHT FOREARM  Result Value Ref Range Status   Specimen Description   Final    BLOOD RIGHT FOREARM Performed at [facility], [address], [place], [place] [address]    Special Requests  Final    BOTTLES DRAWN AEROBIC AND ANAEROBIC Blood Culture adequate volume Performed at [facility], [address] [place], [place] [address]    Culture   Final    NO GROWTH 5 DAYS Performed at [facility], [address] [place], [place] [address]    Report Status 03/04/2021 FINAL  Final  Aerobic/Anaerobic Culture w Gram Stain (surgical/deep wound)     Status: None   Collection  Time: 03/09/2021 11:46 PM   Specimen: Groin, Right; Abscess  Result Value Ref Range Status   Specimen Description ABSCESS  Final   Special Requests RIGHT GROIN  Final   Gram Stain   Final    FEW WBC PRESENT, PREDOMINANTLY MONONUCLEAR RARE GRAM POSITIVE COCCI RARE GRAM NEGATIVE RODS    Culture   Final    No growth aerobically or anaerobically. Performed at [facility], [address] [place], [place] [address]    Report Status 03/05/2021 FINAL  Final  Aerobic Culture w Gram Stain (superficial specimen)     Status: None   Collection Time: March 09, 2021 11:53 PM   Specimen: PATH Vessel; Other  Result Value Ref Range Status   Specimen Description TISSUE  Final   Special Requests RIGHT AORTA BIFEMORAL GRAFT  Final   Gram Stain   Final    FEW WBC PRESENT, PREDOMINANTLY MONONUCLEAR RARE GRAM POSITIVE COCCI    Culture   Final    NO GROWTH 2 DAYS Performed at [facility], [address] [place], [place] [address]    Report Status 03/02/2021 FINAL  Final  Anaerobic culture w Gram Stain     Status: None   Collection Time: 03/09/2021 11:53 PM   Specimen: PATH Vessel; Other  Result Value Ref Range Status   Specimen Description TISSUE  Final   Special Requests RIGHT AORTA BIFEMORAL GRAFT  Final   Culture   Final    NO ANAEROBES ISOLATED Performed at [facility], [address] [place], [place] [address]    Report Status 03/05/2021 FINAL  Final    Rexene Alberts, MSN, NP-C [facility] Pager: [phone]  03/06/2021 1:56 PM

## 2021-03-06 NOTE — Progress Notes (Signed)
Occupational Therapy Treatment and Discharge Patient Details Name: Wayne [name] MRN: [id number] DOB: [birth year] Today's Date: 03/06/2021   History of present illness Pt is a 65 y.o. male admitted 02/24/2021 for R groin reexploration, repair of R common femoral pseudoaneurysm, sartorial muscle flap coverage with R groin wound vac placement. PMH includes CHF, COPD, HTN, PVD, pulmonary HTN, arthritis, malnutrition.   OT comments  Pt is functioning independently in ADL and ADL transfers. No further OT needs.    Recommendations for follow up therapy are one component of a multi-disciplinary discharge planning process, led by the attending physician.  Recommendations may be updated based on patient status, additional functional criteria and insurance authorization.    Follow Up Recommendations  No OT follow up    Assistance Recommended at Discharge PRN  Equipment Recommendations  None recommended by OT    Recommendations for Other Services      Precautions / Restrictions Precautions Precautions: Other (comment) Restrictions Weight Bearing Restrictions: No       Mobility Bed Mobility               General bed mobility comments: seated EOB at beginning and end of session, declined sitting in chair    Transfers Overall transfer level: Independent Equipment used: None                     Balance     Sitting balance-Leahy Scale: Normal       Standing balance-Leahy Scale: Good                             ADL either performed or assessed with clinical judgement   ADL       Grooming: Independent;Standing       Lower Body Bathing: Independent;Sit to/from stand       Lower Body Dressing: Independent;Sit to/from stand   Toilet Transfer: Independent;Ambulation;Regular Toilet   Toileting- Water quality scientist and Hygiene: Independent;Sit to/from stand       Functional mobility during ADLs: Independent      Extremity/Trunk Assessment               Vision       Perception     Praxis      Cognition Arousal/Alertness: Awake/alert Behavior During Therapy: WFL for tasks assessed/performed Overall Cognitive Status: Within Functional Limits for tasks assessed                                            Exercises     Shoulder Instructions       General Comments      Pertinent Vitals/ Pain       Pain Assessment: Faces Pain Score: 0-No pain  Home Living                                          Prior Functioning/Environment              Frequency           Progress Toward Goals  OT Goals(current goals can now be found in the care plan section)  Progress towards OT goals: Goals met/education completed, patient discharged from [place] All goals met and education  completed, patient discharged from OT services    Co-evaluation                 AM-PAC OT "6 Clicks" Daily Activity     Outcome Measure   Help from another person eating meals?: None Help from another person taking care of personal grooming?: None Help from another person toileting, which includes using toliet, bedpan, or urinal?: None Help from another person bathing (including washing, rinsing, drying)?: None Help from another person to put on and taking off regular upper body clothing?: None Help from another person to put on and taking off regular lower body clothing?: None 6 Click Score: 24    End of Session    OT Visit Diagnosis: Muscle weakness (generalized) (M62.81)   Activity Tolerance Patient tolerated treatment well   Patient Left in bed;with call bell/phone within reach;with bed alarm set   Nurse Communication          Time: 5789-7847 OT Time Calculation (min): 15 min  Charges:    Nestor Lewandowsky, OTR/L Acute Rehabilitation Services Pager: [phone] Office: [phone]  Malka So 03/06/2021, 1:59 PM

## 2021-03-06 NOTE — Progress Notes (Signed)
Pharmacy Antibiotic Note  Wayne [name] is a 65 y.o. 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  Pharmacy has been consulted for vancomycin and cefepime dosing.  12/29 AM update:  AUC is sub-therapeutic  Renal function stable  Plan: Inc vancomycin to 1000 mg IV q24h >>New estimated AUC: 557 Cont Cefepime 2g IV q12h Re-check levels as needed  Height: 5\' 3"  (160 cm) Weight: 43.8 kg (96 lb 9 oz) IBW/kg (Calculated) : 56.9  Temp (24hrs), Avg:98.1 F (36.7 C), Min:97.5 F (36.4 C), Max:98.5 F (36.9 C)  Recent Labs  Lab 02/28/2021 1647 02/11/2021 1909 02/28/21 0315 03/01/21 0245 03/02/21 0101 03/03/21 0932 03/05/21 0106 03/05/21 2123 03/06/21 0208  WBC 8.4  --  12.6* 10.0  --  8.5 14.3*  --   --   CREATININE 1.08  --  0.83 1.17 1.04  --  0.89  --   --   LATICACIDVEN 1.6 1.1  --   --   --   --   --   --   --   VANCOTROUGH  --   --   --   --   --   --   --  6*  --   VANCOPEAK  --   --   --   --   --   --   --   --  29*     Estimated Creatinine Clearance: 49.9 mL/min (by C-G formula based on SCr of 0.89 mg/dL).    Allergies  Allergen Reactions   Penicillins Itching, Rash and Other (See Comments)    PATIENT HAS HAD A PCN REACTION WITH IMMEDIATE RASH, FACIAL/TONGUE/THROAT SWELLING, SOB, OR LIGHTHEADEDNESS WITH HYPOTENSION:  #  #  #  YES  #  #  #   Has patient had a PCN reaction causing severe rash involving mucus membranes or skin necrosis: No Has patient had a PCN reaction that required hospitalization: No Has patient had a PCN reaction occurring within the last 10 years: No If all of the above answers are "NO", then may proceed with Cephalosporin use.     30, PharmD, BCPS Clinical Pharmacist Phone: [phone]

## 2021-03-06 NOTE — Progress Notes (Signed)
Received call from CCMD notified pt's HR=200 s. Not sustained. Pt asymptomatic. Will continue to monitor the pt.   Lawson Radar, RN

## 2021-03-06 NOTE — TOC Benefit Eligibility Note (Signed)
Patient Product/process development scientist completed.    The patient is currently admitted and upon discharge could be taking cefdinir (Omnicef) 300 mg capsules.  The current 42 day co-pay is, $0.00.   The patient is currently admitted and upon discharge could be taking Sivextro Tablets.  Non Formulary  The patient is insured through [employer] Medicare Part D    Roland Earl, CPhT Pharmacy Patient Advocate Specialist [facility] Patient Advocate Team Direct Number: [phone]  Fax: [phone]

## 2021-03-06 NOTE — Progress Notes (Addendum)
Mobility Specialist: Progress Note   03/06/21 1301  Mobility  Activity Ambulated in hall  Level of Assistance Independent  Assistive Device None  Distance Ambulated (ft) 230 ft  Mobility Ambulated independently in hallway  Mobility Response Tolerated well  Mobility performed by Mobility specialist  $Mobility charge 1 Mobility   Pre-Mobility on 2 L/min [place]: 99 HR, 98% SpO2 During Mobility on RA: 130 HR, 92% SpO2 Post-Mobility on RA: 125 HR, 90/46 (60) BP, 97% SpO2  Pt independent with mobility, occasionally putting hand on railing in the hallway but more for comfort than balance. Pt stopped x1 for standing break d/t feeling SOB, otherwise no other c/o. Pt back to bed after walk with call bell and phone at his side.   [facility] Richetta Cubillos Mobility Specialist Mobility Specialist [address] [place]: [phone] Mobility Specialist [address] [place] and [address] [place]: [phone]

## 2021-03-06 NOTE — Progress Notes (Addendum)
°  Progress Note    03/06/2021 7:45 AM 7 Days Post-Op  Subjective:  No new complaints   Vitals:   03/05/21 2334 03/06/21 0326  BP: 92/61 106/64  Pulse: 76 74  Resp: 20 16  Temp: (!) 97.5 F (36.4 C) 98.5 F (36.9 C)  SpO2: 99% 98%   Physical Exam: Lungs:  non labored Incisions:  R groin with vac in place Extremities:  DP and PT signal by doppler bilaterally Abdomen: soft Neurologic: A&O  CBC    Component Value Date/Time   WBC 14.3 (H) 03/05/2021 0106   RBC 2.79 (L) 03/05/2021 0106   HGB 9.6 (L) 03/05/2021 0106   HCT 27.7 (L) 03/05/2021 0106   PLT 181 03/05/2021 0106   MCV 99.3 03/05/2021 0106   MCH 34.4 (H) 03/05/2021 0106   MCHC 34.7 03/05/2021 0106   RDW 14.3 03/05/2021 0106   LYMPHSABS 1.2 02/26/2021 1647   MONOABS 0.8 03/05/2021 1647   EOSABS 0.0 02/21/2021 1647   BASOSABS 0.1 02/12/2021 1647    BMET    Component Value Date/Time   NA 134 (L) 03/05/2021 0106   K 3.5 03/05/2021 0106   CL 105 03/05/2021 0106   CO2 22 03/05/2021 0106   GLUCOSE 85 03/05/2021 0106   BUN 8 03/05/2021 0106   CREATININE 0.89 03/05/2021 0106   CALCIUM 8.0 (L) 03/05/2021 0106   GFRNONAA >60 03/05/2021 0106   GFRAA >60 06/21/2017 0305    INR    Component Value Date/Time   INR 1.1 03/03/2021 1647     Intake/Output Summary (Last 24 hours) at 03/06/2021 0745 Last data filed at 03/05/2021 1900 Gross per 24 hour  Intake 920 ml  Output 1200 ml  Net -280 ml     Assessment/Plan:  65 y.o. male is s/p repair of R CFA pseudoaneurysm 7 Days Post-Op   BLE well perfused by doppler exam R groin vac with good seal; next change is tomorrow [facility] RN consulted for home vac Leukocytosis: encouraged IS; UA pending Final cultures without growth however gram positive cocci and gram negative rods on gram stain; will ask ID for help with antibiotic regimen Home when [facility] arranged and antibiotic plan in place   Emilie Rutter, PA-C [facility] Vein  Specialists [phone] 03/06/2021 7:45 AM   VASCULAR STAFF ADDENDUM: I have independently interviewed and examined the patient. I agree with the above.  Await ID plan. Needs HHN for VAC.  Likely dispo in next 24-48h.  Rande Brunt. Lenell Antu, MD [facility] of [facility] Phone Number: [phone] 03/06/2021 3:43 PM

## 2021-03-06 NOTE — Progress Notes (Signed)
SATURATION QUALIFICATIONS: (PRN) Patient Saturations on Room Air at Rest = 88%  Patient Saturations on 2L=96%  Pt still need oxygen PRN.

## 2021-03-06 NOTE — Progress Notes (Signed)
Mobility Specialist: Progress Note SATURATION QUALIFICATIONS: (This note is used to comply with regulatory documentation for home oxygen)  Patient Saturations on Room Air at Rest = 97%  Patient Saturations on Room Air while Ambulating = 92%  Patient Saturations on 0 Liters of oxygen while Ambulating = 92%  Research scientist (life sciences) Specialist 4 [place]: [phone] Mobility Specialist 2 [place] and 6 [place]: [phone]

## 2021-03-07 DIAGNOSIS — I724 Aneurysm of artery of lower extremity: Secondary | ICD-10-CM | POA: Diagnosis not present

## 2021-03-07 LAB — CBC
HCT: 29 % — ABNORMAL LOW (ref 39.0–52.0)
Hemoglobin: 9.7 g/dL — ABNORMAL LOW (ref 13.0–17.0)
MCH: 33.8 pg (ref 26.0–34.0)
MCHC: 33.4 g/dL (ref 30.0–36.0)
MCV: 101 fL — ABNORMAL HIGH (ref 80.0–100.0)
Platelets: 266 10*3/uL (ref 150–400)
RBC: 2.87 MIL/uL — ABNORMAL LOW (ref 4.22–5.81)
RDW: 14.6 % (ref 11.5–15.5)
WBC: 18.3 10*3/uL — ABNORMAL HIGH (ref 4.0–10.5)
nRBC: 0 % (ref 0.0–0.2)

## 2021-03-07 MED ORDER — DOXYCYCLINE HYCLATE 100 MG PO TABS
100.0000 mg | ORAL_TABLET | Freq: Two times a day (BID) | ORAL | Status: DC
  Administered 2021-03-07 (×2): 100 mg via ORAL
  Filled 2021-03-07 (×3): qty 1

## 2021-03-07 MED ORDER — AMOXICILLIN-POT CLAVULANATE 875-125 MG PO TABS
1.0000 | ORAL_TABLET | Freq: Two times a day (BID) | ORAL | Status: DC
  Administered 2021-03-07 (×2): 1 via ORAL
  Filled 2021-03-07 (×3): qty 1

## 2021-03-07 NOTE — Consult Note (Signed)
WOC Nurse Consult Note: Vascular team following for assessment and plan of care.  Requested to change Vac dressing Wound type: Right groin with full thickness post-op wound; 3X3X.3cm, 100% beefy red.  Pt was medicated for pain prior to the procedure and tolerated with minimal amt discomfort.  Applied barrier ring to wound edges to attempt to maintain a seal, then one piece black foam to cont suction.  Staples intact and approximated to wound edges and covered with barrier ring to protect.  Previous cannister was full of old bloody drainage so new one applied.  There was 75cc of blood-tinged drainage since the previous marking.  No active bleeding noted during dressing change.  WOC team will plan to change the Vac dressing again on Mon if patient is still in the hospital at that time.  Cammie Mcgee MSN, RN, CWOCN, [place], CNS [phone]

## 2021-03-07 NOTE — Progress Notes (Signed)
Mobility Specialist: Progress Note   03/07/21 1721  Mobility  Activity Ambulated in hall  Level of Assistance Independent  Distance Ambulated (ft) 150 ft  Mobility Ambulated independently in hallway  Mobility Response Tolerated fair  Mobility performed by Mobility specialist  $Mobility charge 1 Mobility   Pre-Mobility on 2 L/min [place]: 102 HR, 98% SpO2 Post-Mobility on RA: 86 HR, 97% SpO2  Pt to BR, void successful, then agreeable to ambulation. Pt stopped x1 for brief standing break d/t SOB, as well as back and abdominal pain. Pt back to bed after walk with call bell and phone at his side.   [facility] [place] Mobility Specialist Mobility Specialist 4 [place]: [phone] Mobility Specialist 2 [place] and 6 [place]: [phone]

## 2021-03-07 NOTE — Progress Notes (Addendum)
°  Progress Note    03/07/2021 7:29 AM 8 Days Post-Op  Subjective:  no new complaints   Vitals:   03/07/21 0002 03/07/21 0407  BP: (!) 105/55 (!) 90/53  Pulse: 83 86  Resp: 16 20  Temp: 98.5 F (36.9 C) 99 F (37.2 C)  SpO2: 100% 96%   Physical Exam: Lungs:  non labored Incisions:  R groin with vac Extremities:  DP and PT by doppler bilaterally Neurologic: A&O  CBC    Component Value Date/Time   WBC 18.3 (H) 03/07/2021 0125   RBC 2.87 (L) 03/07/2021 0125   HGB 9.7 (L) 03/07/2021 0125   HCT 29.0 (L) 03/07/2021 0125   PLT 266 03/07/2021 0125   MCV 101.0 (H) 03/07/2021 0125   MCH 33.8 03/07/2021 0125   MCHC 33.4 03/07/2021 0125   RDW 14.6 03/07/2021 0125   LYMPHSABS 1.2 02/07/2021 1647   MONOABS 0.8 02/23/2021 1647   EOSABS 0.0 02/23/2021 1647   BASOSABS 0.1 02/10/2021 1647    BMET    Component Value Date/Time   NA 134 (L) 03/05/2021 0106   K 3.5 03/05/2021 0106   CL 105 03/05/2021 0106   CO2 22 03/05/2021 0106   GLUCOSE 85 03/05/2021 0106   BUN 8 03/05/2021 0106   CREATININE 0.89 03/05/2021 0106   CALCIUM 8.0 (L) 03/05/2021 0106   GFRNONAA >60 03/05/2021 0106   GFRAA >60 06/21/2017 0305    INR    Component Value Date/Time   INR 1.1 02/26/2021 1647     Intake/Output Summary (Last 24 hours) at 03/07/2021 0729 Last data filed at 03/07/2021 6270 Gross per 24 hour  Intake 580.06 ml  Output 800 ml  Net -219.94 ml     Assessment/Plan:  65 y.o. male is s/p repair of R CFA pseudoaneurysm 8 Days Post-Op   BLE warm and well perfused R groin with good seal; this will be changed today Leukocytosis; encouraged IS, ID consulted for antibiotic regimen; if leukocytosis does not improve, could consider CT c/a/p w contrast Awaiting arrangements for [facility] RN and home vac    Emilie Rutter, PA-C [facility] [phone] 03/07/2021 7:29 AM  VASCULAR STAFF ADDENDUM: I agree with the above.   Rande Brunt. Lenell Antu, MD Vascular and Vein  Specialists of [facility] Phone Number: [phone] 03/07/2021 3:00 PM

## 2021-03-07 NOTE — TOC Progression Note (Signed)
Transition of Care (TOC) - Progression Note  Sander Radon, BSN Transitions of Care Unit 4E- RN Case Manager See Treatment Team for direct phone #    Patient Details  Name: Wayne [name] MRN: [id number] Date of Birth: May 15, 1955  Transition of Care [facility]) CM/SW Contact  Zenda Alpers Lenn Sink, RN Phone Number: 03/07/2021, 2:19 PM  Clinical Narrative:    Follow up done for [facility] needs- per French Ana with [employer] home [facility] still approved- and ready VAC will be available when pt ready for transition home- French Ana will need to be contacted when pt is ready for discharge so paperwork can be sent to release the ready VAC.  Also heard from [place] with [place] and they will be able to accept referral and assist for home [facility] needs. However they will not be able to do a start of care until next week either Wed or Friday. Misty Stanley will confirm prior to discharge.  Pt will need to stay in house at least until Monday for next [facility] drsg change, call made to [facility] V[facility] to update on d/c plan- per vascular pt has elevated WBC and is currently not medically stable for transition home- will f/u first of next week.   TOC will continue to follow   Expected Discharge Plan: Home w Home Health Services Barriers to Discharge: Continued Medical Work up  Expected Discharge Plan and Services Expected Discharge Plan: Home w Home Health Services   Discharge Planning Services: CM Consult Post Acute Care Choice: Durable Medical Equipment, Home Health Living arrangements for the past 2 months: Single Family Home                 DME Arranged: Vac DME Agency: [facility] Date DME Agency Contacted: 02/28/21   Representative spoke with at DME Agency: French Ana [facility] Arranged: RN [facility] Agency: [facility] Date [facility] Agency Contacted: 03/07/21 Time HH Agency Contacted: 1230 Representative spoke with at [facility] Agency: Misty Stanley   Social Determinants of Health (SDOH) Interventions    Readmission Risk Interventions No flowsheet data  found.

## 2021-03-07 NOTE — Progress Notes (Signed)
[facility]   Reason for visit: Follow up on graft infection  Interval History: underwent penicillin challenge and no rash, no concerns.   Remains afebrile.   WBC 18.3.    Physical Exam: Constitutional:  Vitals:   03/07/21 0829 03/07/21 1123  BP:  (!) 100/58  Pulse:  90  Resp:  16  Temp:  97.7 F (36.5 C)  SpO2: 94% 100%   patient appears in NAD Respiratory: Normal respiratory effort; CTA B Cardiovascular: RRR GI: soft, nt, nd  Review of Systems: Constitutional: negative for fevers, chills, and malaise Respiratory: negative for cough, stridor Cardiovascular: negative for chest pressure/discomfort Integument/breast: negative for rash  Lab Results  Component Value Date   WBC 18.3 (H) 03/07/2021   HGB 9.7 (L) 03/07/2021   HCT 29.0 (L) 03/07/2021   MCV 101.0 (H) 03/07/2021   PLT 266 03/07/2021    Lab Results  Component Value Date   CREATININE 0.89 03/05/2021   BUN 8 03/05/2021   NA 134 (L) 03/05/2021   K 3.5 03/05/2021   CL 105 03/05/2021   CO2 22 03/05/2021    Lab Results  Component Value Date   ALT 18 02/07/2021   AST 28 02/10/2021   ALKPHOS 74 03/01/2021     Microbiology: Recent Results (from the past 240 hour(s))  Blood culture (routine x 2)     Status: None   Collection Time: 02/14/2021  4:45 PM   Specimen: BLOOD LEFT FOREARM  Result Value Ref Range Status   Specimen Description   Final    BLOOD LEFT FOREARM Performed at [facility], [address] [place], [place] [address]    Special Requests   Final    BOTTLES DRAWN AEROBIC AND ANAEROBIC Blood Culture adequate volume Performed at [facility], [address] [place], [place] [address]    Culture   Final    NO GROWTH 5 DAYS Performed at [facility], [address] [place], [place] [address]    Report Status 03/04/2021 FINAL  Final  Resp Panel by RT-PCR (Flu A&B, Covid) Nasopharyngeal Swab     Status: None   Collection Time:  02/06/2021  4:46 PM   Specimen: Nasopharyngeal Swab; Nasopharyngeal(NP) swabs in vial transport medium  Result Value Ref Range Status   SARS Coronavirus 2 by RT PCR NEGATIVE NEGATIVE Final    Comment: (NOTE) SARS-CoV-2 target nucleic acids are NOT DETECTED.  The SARS-CoV-2 RNA is generally detectable in upper respiratory specimens during the acute phase of infection. The lowest concentration of SARS-CoV-2 viral copies this assay can detect is 138 copies/mL. A negative result does not preclude SARS-Cov-2 infection and should not be used as the sole basis for treatment or other patient management decisions. A negative result may occur with  improper specimen collection/handling, submission of specimen other than nasopharyngeal swab, presence of viral mutation(s) within the areas targeted by this assay, and inadequate number of viral copies(<138 copies/mL). A negative result must be combined with clinical observations, patient history, and epidemiological information. The expected result is Negative.  Fact Sheet for Patients:  BloggerCourse.com  Fact Sheet for Healthcare Providers:  SeriousBroker.it  This test is no t yet approved or cleared by the [place] FDA and  has been authorized for detection and/or diagnosis of SARS-CoV-2 by FDA under an Emergency Use Authorization (EUA). This EUA will remain  in effect (meaning this test can be used) for the duration of the COVID-19  declaration under Section 564(b)(1) of the Act, 21 U.S.C.section 360bbb-3(b)(1), unless the authorization is terminated  or revoked sooner.       Influenza A by PCR NEGATIVE NEGATIVE Final   Influenza B by PCR NEGATIVE NEGATIVE Final    Comment: (NOTE) The Xpert Xpress SARS-CoV-2/FLU/RSV plus assay is intended as an aid in the diagnosis of influenza from Nasopharyngeal swab specimens and should not be used as a sole basis for treatment. Nasal washings  and aspirates are unacceptable for Xpert Xpress SARS-CoV-2/FLU/RSV testing.  Fact Sheet for Patients: BloggerCourse.com  Fact Sheet for Healthcare Providers: SeriousBroker.it  This test is not yet approved or cleared by the [place] FDA and has been authorized for detection and/or diagnosis of SARS-CoV-2 by FDA under an Emergency Use Authorization (EUA). This EUA will remain in effect (meaning this test can be used) for the duration of the COVID-19 declaration under Section 564(b)(1) of the Act, 21 U.S.C. section 360bbb-3(b)(1), unless the authorization is terminated or revoked.  Performed at [facility], [address] [place], [place] [address]   Blood culture (routine x 2)     Status: None   Collection Time: 02/26/2021  4:50 PM   Specimen: BLOOD RIGHT FOREARM  Result Value Ref Range Status   Specimen Description   Final    BLOOD RIGHT FOREARM Performed at [facility], [address] [place], [place] [address]    Special Requests   Final    BOTTLES DRAWN AEROBIC AND ANAEROBIC Blood Culture adequate volume Performed at [facility], [address] [place], [place] [address]    Culture   Final    NO GROWTH 5 DAYS Performed at [facility], [address], [place], [place] [address]    Report Status 03/04/2021 FINAL  Final  Aerobic/Anaerobic Culture w Gram Stain (surgical/deep wound)     Status: None   Collection Time: 03/07/2021 11:46 PM   Specimen: Groin, Right; Abscess  Result Value Ref Range Status   Specimen Description ABSCESS  Final   Special Requests RIGHT GROIN  Final   Gram Stain   Final    FEW WBC PRESENT, PREDOMINANTLY MONONUCLEAR RARE GRAM POSITIVE COCCI RARE GRAM NEGATIVE RODS    Culture   Final    No growth aerobically or anaerobically. Performed at [facility], [address] [place], [place] [address]    Report Status 03/05/2021 FINAL  Final   Aerobic Culture w Gram Stain (superficial specimen)     Status: None   Collection Time: 02/21/2021 11:53 PM   Specimen: PATH Vessel; Other  Result Value Ref Range Status   Specimen Description TISSUE  Final   Special Requests RIGHT AORTA BIFEMORAL GRAFT  Final   Gram Stain   Final    FEW WBC PRESENT, PREDOMINANTLY MONONUCLEAR RARE GRAM POSITIVE COCCI    Culture   Final    NO GROWTH 2 DAYS Performed at [facility], [address] [place], [place] [address]    Report Status 03/02/2021 FINAL  Final  Anaerobic culture w Gram Stain     Status: None   Collection Time: 03/03/2021 11:53 PM   Specimen: PATH Vessel; Other  Result Value Ref Range Status   Specimen Description TISSUE  Final   Special Requests RIGHT AORTA BIFEMORAL GRAFT  Final   Culture   Final    NO ANAEROBES ISOLATED Performed at [facility], [address] [place],  [place] [address]    Report Status 03/05/2021 FINAL  Final    Impression/Plan:  1. Right femoral arterial graft-associated infection, s/p debridement with some graft retention and new graft placement - no positive cultures from the debridement and gram stain noted with GPC and GNRs.  Broad differential of organisms. I agree that treatment with oral therapy ideal in him and will use doxycycline and amoxicillin/clavulanate.  Due to the remaining graft material and new material, plan for a prolonged course of antibiotics for 6 months followed by suppression antibiotics after that.    2.  Penicillin allergy - tolerated challenge well and no issues.  He is now delisted and will use amoxicillin as above.    3.  Leukocytosis - some increase but he remains afebrile and clinically appears well.  Seems to be reactive.  Not having any particular pain at the site.    Follow up arranged with me 03/28/21

## 2021-03-07 NOTE — TOC Progression Note (Signed)
Transition of Care (TOC) - Progression Note  Donn Pierini RN, BSN Transitions of Care Unit 4E- RN Case Manager See Treatment Team for direct phone #    Patient Details  Name: Wayne [name] MRN: [id number] Date of Birth: 07/12/1955  Transition of Care [facility]) CM/SW Contact  Zenda Alpers, Lenn Sink, RN Phone Number: 03/07/2021, 10:11 AM  Clinical Narrative:    [place] Monday with French Ana at [facility]- pt has been approved for home wound VAC- [facility] will have readyVAC available for home [facility] needs when pt ready for discharge- French Ana will need to be contact to complete paperwork for readyVAC and have VAC available for transition home (VAC is here at hospital for this purpose). Awaiting final decision for abx needs ?IV abx for home. Have reached out to [facility] with [employer] for possible IV abx needs and she will follow for any coordination needs, have also reached back out to [place] with [facility] for [facility] needs- will f/u back up on Friday to see if [facility] can assist with [facility].    Expected Discharge Plan: Home w Home Health Services Barriers to Discharge: Continued Medical Work up  Expected Discharge Plan and Services Expected Discharge Plan: Home w Home Health Services   Discharge Planning Services: CM Consult Post Acute Care Choice: Durable Medical Equipment, Home Health Living arrangements for the past 2 months: Single Family Home                 DME Arranged: Vac DME Agency: [facility] Date DME Agency Contacted: 02/28/21   Representative spoke with at DME Agency: French Ana [facility] Arranged: RN           Social Determinants of Health (SDOH) Interventions    Readmission Risk Interventions No flowsheet data found.

## 2021-03-08 ENCOUNTER — Inpatient Hospital Stay (HOSPITAL_COMMUNITY): Payer: Medicare Other

## 2021-03-08 ENCOUNTER — Encounter (HOSPITAL_COMMUNITY): Admission: EM | Disposition: E | Payer: Self-pay | Source: Home / Self Care | Attending: Vascular Surgery

## 2021-03-08 ENCOUNTER — Encounter (HOSPITAL_COMMUNITY): Payer: Self-pay | Admitting: Vascular Surgery

## 2021-03-08 ENCOUNTER — Inpatient Hospital Stay (HOSPITAL_COMMUNITY): Payer: Medicare Other | Admitting: Certified Registered Nurse Anesthetist

## 2021-03-08 DIAGNOSIS — I724 Aneurysm of artery of lower extremity: Secondary | ICD-10-CM | POA: Diagnosis not present

## 2021-03-08 HISTORY — PX: LAPAROSCOPY: SHX197

## 2021-03-08 LAB — CBC
HCT: 28.8 % — ABNORMAL LOW (ref 39.0–52.0)
Hemoglobin: 9.7 g/dL — ABNORMAL LOW (ref 13.0–17.0)
MCH: 33.7 pg (ref 26.0–34.0)
MCHC: 33.7 g/dL (ref 30.0–36.0)
MCV: 100 fL (ref 80.0–100.0)
Platelets: 313 10*3/uL (ref 150–400)
RBC: 2.88 MIL/uL — ABNORMAL LOW (ref 4.22–5.81)
RDW: 14.5 % (ref 11.5–15.5)
WBC: 19.4 10*3/uL — ABNORMAL HIGH (ref 4.0–10.5)
nRBC: 0 % (ref 0.0–0.2)

## 2021-03-08 LAB — LACTIC ACID, PLASMA: Lactic Acid, Venous: 1.5 mmol/L (ref 0.5–1.9)

## 2021-03-08 SURGERY — LAPAROSCOPY, DIAGNOSTIC
Anesthesia: General | Site: Abdomen

## 2021-03-08 MED ORDER — PROMETHAZINE HCL 25 MG/ML IJ SOLN: 6.2500 mg | INTRAMUSCULAR | Status: DC | PRN

## 2021-03-08 MED ORDER — DEXAMETHASONE SODIUM PHOSPHATE 10 MG/ML IJ SOLN
INTRAMUSCULAR | Status: DC | PRN
  Administered 2021-03-08: 5 mg via INTRAVENOUS

## 2021-03-08 MED ORDER — ROCURONIUM BROMIDE 10 MG/ML (PF) SYRINGE
PREFILLED_SYRINGE | INTRAVENOUS | Status: DC | PRN
  Administered 2021-03-08: 60 mg via INTRAVENOUS

## 2021-03-08 MED ORDER — ROCURONIUM BROMIDE 10 MG/ML (PF) SYRINGE
PREFILLED_SYRINGE | INTRAVENOUS | Status: AC
  Filled 2021-03-08: qty 10

## 2021-03-08 MED ORDER — ALBUMIN HUMAN 5 % IV SOLN: INTRAVENOUS | Status: DC | PRN

## 2021-03-08 MED ORDER — MIDAZOLAM HCL 2 MG/2ML IJ SOLN
INTRAMUSCULAR | Status: AC
  Filled 2021-03-08: qty 2

## 2021-03-08 MED ORDER — HYDROMORPHONE HCL 1 MG/ML IJ SOLN
0.2500 mg | INTRAMUSCULAR | Status: DC | PRN
Start: 2021-03-08 — End: 2021-03-08

## 2021-03-08 MED ORDER — CHLORHEXIDINE GLUCONATE 0.12 % MT SOLN: 15.0000 mL | Freq: Once | OROMUCOSAL | Status: AC

## 2021-03-08 MED ORDER — DEXAMETHASONE SODIUM PHOSPHATE 10 MG/ML IJ SOLN
INTRAMUSCULAR | Status: AC
  Filled 2021-03-08: qty 1

## 2021-03-08 MED ORDER — ONDANSETRON HCL 4 MG/2ML IJ SOLN
INTRAMUSCULAR | Status: AC
  Filled 2021-03-08: qty 2

## 2021-03-08 MED ORDER — PHENYLEPHRINE HCL-NACL 20-0.9 MG/250ML-% IV SOLN
INTRAVENOUS | Status: DC | PRN
  Administered 2021-03-08: 40 ug/min via INTRAVENOUS

## 2021-03-08 MED ORDER — FENTANYL CITRATE (PF) 250 MCG/5ML IJ SOLN
INTRAMUSCULAR | Status: DC | PRN
  Administered 2021-03-08: 50 ug via INTRAVENOUS

## 2021-03-08 MED ORDER — PHENYLEPHRINE 40 MCG/ML (10ML) SYRINGE FOR IV PUSH (FOR BLOOD PRESSURE SUPPORT)
PREFILLED_SYRINGE | INTRAVENOUS | Status: AC
  Filled 2021-03-08: qty 10

## 2021-03-08 MED ORDER — BUPIVACAINE HCL (PF) 0.25 % IJ SOLN
INTRAMUSCULAR | Status: AC
  Filled 2021-03-08: qty 30

## 2021-03-08 MED ORDER — BUPIVACAINE HCL 0.25 % IJ SOLN
INTRAMUSCULAR | Status: DC | PRN
  Administered 2021-03-08: 4 mL

## 2021-03-08 MED ORDER — IOHEXOL 350 MG/ML SOLN
100.0000 mL | Freq: Once | INTRAVENOUS | Status: AC | PRN
  Administered 2021-03-08: 100 mL via INTRAVENOUS

## 2021-03-08 MED ORDER — VANCOMYCIN HCL 750 MG/150ML IV SOLN
750.0000 mg | INTRAVENOUS | Status: DC
  Administered 2021-03-08 (×2): 750 mg via INTRAVENOUS
  Filled 2021-03-08 (×3): qty 150

## 2021-03-08 MED ORDER — LIDOCAINE 2% (20 MG/ML) 5 ML SYRINGE
INTRAMUSCULAR | Status: AC
  Filled 2021-03-08: qty 5

## 2021-03-08 MED ORDER — ONDANSETRON HCL 4 MG/2ML IJ SOLN
INTRAMUSCULAR | Status: DC | PRN
  Administered 2021-03-08: 4 mg via INTRAVENOUS

## 2021-03-08 MED ORDER — LACTATED RINGERS IV SOLN: INTRAVENOUS | Status: DC

## 2021-03-08 MED ORDER — CHLORHEXIDINE GLUCONATE 0.12 % MT SOLN
OROMUCOSAL | Status: AC
  Administered 2021-03-08: 15 mL via OROMUCOSAL
  Filled 2021-03-08: qty 15

## 2021-03-08 MED ORDER — PROPOFOL 10 MG/ML IV BOLUS
INTRAVENOUS | Status: DC | PRN
  Administered 2021-03-08: 80 mg via INTRAVENOUS

## 2021-03-08 MED ORDER — LIDOCAINE 2% (20 MG/ML) 5 ML SYRINGE
INTRAMUSCULAR | Status: DC | PRN
  Administered 2021-03-08: 60 mg via INTRAVENOUS

## 2021-03-08 MED ORDER — MEPERIDINE HCL 25 MG/ML IJ SOLN: 6.2500 mg | INTRAMUSCULAR | Status: DC | PRN

## 2021-03-08 MED ORDER — SUCCINYLCHOLINE CHLORIDE 200 MG/10ML IV SOSY
PREFILLED_SYRINGE | INTRAVENOUS | Status: AC
  Filled 2021-03-08: qty 10

## 2021-03-08 MED ORDER — SODIUM CHLORIDE 0.9 % IV SOLN: INTRAVENOUS | Status: DC

## 2021-03-08 MED ORDER — OXYCODONE HCL 5 MG PO TABS: 5.0000 mg | ORAL_TABLET | Freq: Once | ORAL | Status: DC | PRN

## 2021-03-08 MED ORDER — OXYCODONE HCL 5 MG/5ML PO SOLN: 5.0000 mg | Freq: Once | ORAL | Status: DC | PRN

## 2021-03-08 MED ORDER — 0.9 % SODIUM CHLORIDE (POUR BTL) OPTIME
TOPICAL | Status: DC | PRN
  Administered 2021-03-08: 1000 mL

## 2021-03-08 MED ORDER — PROPOFOL 10 MG/ML IV BOLUS
INTRAVENOUS | Status: AC
  Filled 2021-03-08: qty 20

## 2021-03-08 MED ORDER — FENTANYL CITRATE (PF) 250 MCG/5ML IJ SOLN
INTRAMUSCULAR | Status: AC
  Filled 2021-03-08: qty 5

## 2021-03-08 MED ORDER — MIDAZOLAM HCL 2 MG/2ML IJ SOLN
INTRAMUSCULAR | Status: DC | PRN
  Administered 2021-03-08: 1 mg via INTRAVENOUS

## 2021-03-08 MED ORDER — SUGAMMADEX SODIUM 200 MG/2ML IV SOLN
INTRAVENOUS | Status: DC | PRN
  Administered 2021-03-08: 200 mg via INTRAVENOUS

## 2021-03-08 MED ORDER — PHENYLEPHRINE 40 MCG/ML (10ML) SYRINGE FOR IV PUSH (FOR BLOOD PRESSURE SUPPORT)
PREFILLED_SYRINGE | INTRAVENOUS | Status: DC | PRN
  Administered 2021-03-08: 120 ug via INTRAVENOUS
  Administered 2021-03-08: 160 ug via INTRAVENOUS

## 2021-03-08 MED ORDER — MIDAZOLAM HCL 2 MG/2ML IJ SOLN: 0.5000 mg | Freq: Once | INTRAMUSCULAR | Status: DC | PRN

## 2021-03-08 MED ORDER — SODIUM CHLORIDE 0.9 % IV SOLN
3.0000 g | Freq: Four times a day (QID) | INTRAVENOUS | Status: DC
  Administered 2021-03-08 – 2021-03-09 (×5): 3 g via INTRAVENOUS
  Filled 2021-03-08 (×7): qty 8

## 2021-03-08 MED ORDER — LACTATED RINGERS IV SOLN: INTRAVENOUS | Status: DC | PRN

## 2021-03-08 SURGICAL SUPPLY — 56 items
BAG COUNTER SPONGE SURGICOUNT (BAG) ×3 IMPLANT
BLADE CLIPPER SURG (BLADE) IMPLANT
CANISTER SUCT 3000ML PPV (MISCELLANEOUS) ×3 IMPLANT
CHLORAPREP W/TINT 26 (MISCELLANEOUS) ×3 IMPLANT
COVER SURGICAL LIGHT HANDLE (MISCELLANEOUS) ×3 IMPLANT
DERMABOND ADVANCED (GAUZE/BANDAGES/DRESSINGS) ×1
DERMABOND ADVANCED .7 DNX12 (GAUZE/BANDAGES/DRESSINGS) ×2 IMPLANT
DRAPE LAPAROSCOPIC ABDOMINAL (DRAPES) ×3 IMPLANT
DRAPE WARM FLUID 44X44 (DRAPES) ×3 IMPLANT
DRSG OPSITE POSTOP 4X10 (GAUZE/BANDAGES/DRESSINGS) IMPLANT
DRSG OPSITE POSTOP 4X8 (GAUZE/BANDAGES/DRESSINGS) ×2 IMPLANT
ELECT BLADE 6.5 EXT (BLADE) ×2 IMPLANT
ELECT CAUTERY BLADE 6.4 (BLADE) ×3 IMPLANT
ELECT REM PT RETURN 9FT ADLT (ELECTROSURGICAL) ×3
ELECTRODE REM PT RTRN 9FT ADLT (ELECTROSURGICAL) ×2 IMPLANT
GLOVE SRG 8 PF TXTR STRL LF DI (GLOVE) ×2 IMPLANT
GLOVE SURG ENC MOIS LTX SZ7.5 (GLOVE) ×3 IMPLANT
GLOVE SURG UNDER POLY LF SZ8 (GLOVE) ×3
GOWN STRL REUS W/ TWL LRG LVL3 (GOWN DISPOSABLE) ×4 IMPLANT
GOWN STRL REUS W/ TWL XL LVL3 (GOWN DISPOSABLE) ×2 IMPLANT
GOWN STRL REUS W/TWL LRG LVL3 (GOWN DISPOSABLE) ×6
GOWN STRL REUS W/TWL XL LVL3 (GOWN DISPOSABLE) ×3
HANDLE SUCTION POOLE (INSTRUMENTS) ×2 IMPLANT
KIT BASIN OR (CUSTOM PROCEDURE TRAY) ×3 IMPLANT
KIT TURNOVER KIT B (KITS) ×3 IMPLANT
LIGASURE IMPACT 36 18CM CVD LR (INSTRUMENTS) IMPLANT
NDL INSUFFLATION 14GA 120MM (NEEDLE) ×1 IMPLANT
NEEDLE INSUFFLATION 14GA 120MM (NEEDLE) ×3 IMPLANT
NS IRRIG 1000ML POUR BTL (IV SOLUTION) ×6 IMPLANT
PACK GENERAL/GYN (CUSTOM PROCEDURE TRAY) ×3 IMPLANT
PAD ARMBOARD 7.5X6 YLW CONV (MISCELLANEOUS) ×6 IMPLANT
PENCIL SMOKE EVACUATOR (MISCELLANEOUS) ×3 IMPLANT
SCISSORS LAP 5X35 DISP (ENDOMECHANICALS) IMPLANT
SET IRRIG TUBING LAPAROSCOPIC (IRRIGATION / IRRIGATOR) IMPLANT
SET TUBE SMOKE EVAC HIGH FLOW (TUBING) ×3 IMPLANT
SLEEVE ENDOPATH XCEL 5M (ENDOMECHANICALS) ×5 IMPLANT
SPECIMEN JAR LARGE (MISCELLANEOUS) IMPLANT
SPONGE T-LAP 18X18 ~~LOC~~+RFID (SPONGE) ×4 IMPLANT
STAPLER VISISTAT 35W (STAPLE) ×3 IMPLANT
SUCTION POOLE HANDLE (INSTRUMENTS) ×3
SUT MNCRL AB 4-0 PS2 18 (SUTURE) ×3 IMPLANT
SUT PDS AB 1 TP1 54 (SUTURE) ×2 IMPLANT
SUT SILK 2 0 SH CR/8 (SUTURE) ×1 IMPLANT
SUT SILK 2 0 TIES 10X30 (SUTURE) ×3 IMPLANT
SUT SILK 3 0 SH CR/8 (SUTURE) ×1 IMPLANT
SUT SILK 3 0 TIES 10X30 (SUTURE) ×3 IMPLANT
TOWEL GREEN STERILE (TOWEL DISPOSABLE) ×3 IMPLANT
TOWEL GREEN STERILE FF (TOWEL DISPOSABLE) ×3 IMPLANT
TRAY FOLEY MTR SLVR 16FR STAT (SET/KITS/TRAYS/PACK) ×3 IMPLANT
TRAY LAPAROSCOPIC MC (CUSTOM PROCEDURE TRAY) ×3 IMPLANT
TROCAR XCEL 12X100 BLDLESS (ENDOMECHANICALS) IMPLANT
TROCAR XCEL BLUNT TIP 100MML (ENDOMECHANICALS) IMPLANT
TROCAR XCEL NON-BLD 11X100MML (ENDOMECHANICALS) IMPLANT
TROCAR XCEL NON-BLD 5MMX100MML (ENDOMECHANICALS) ×3 IMPLANT
WARMER LAPAROSCOPE (MISCELLANEOUS) ×3 IMPLANT
YANKAUER SUCT BULB TIP NO VENT (SUCTIONS) ×2 IMPLANT

## 2021-03-08 NOTE — Transfer of Care (Signed)
Immediate Anesthesia Transfer of Care Note  Patient: Wayne [name]  Procedure(s) Performed: LAPAROSCOPY DIAGNOSTIC (Abdomen)  Patient Location: PACU  Anesthesia Type:General  Level of Consciousness: awake  Airway & Oxygen Therapy: Patient Spontanous Breathing and Patient connected to face mask oxygen  Post-op Assessment: Report given to RN and Post -op Vital signs reviewed and stable  Post vital signs: Reviewed and stable  Last Vitals:  Vitals Value Taken Time  BP 138/69 02/16/2021 1614  Temp    Pulse 69 02/23/2021 1628  Resp 10 02/19/2021 1628  SpO2 100 % 02/15/2021 1628  Vitals shown include unvalidated device data.  Last Pain:  Vitals:   03/07/2021 1439  TempSrc: Oral  PainSc: 9       Patients Stated Pain Goal: 0 (03/05/21 2120)  Complications: No notable events documented.

## 2021-03-08 NOTE — Anesthesia Postprocedure Evaluation (Signed)
Anesthesia Post Note  Patient: Wayne [name]  Procedure(s) Performed: LAPAROSCOPY DIAGNOSTIC (Abdomen)     Patient location during evaluation: PACU Anesthesia Type: General Level of consciousness: patient cooperative, oriented and sedated Pain management: pain level controlled Vital Signs Assessment: post-procedure vital signs reviewed and stable Respiratory status: spontaneous breathing, nonlabored ventilation, respiratory function stable and patient connected to nasal cannula oxygen Cardiovascular status: blood pressure returned to baseline and stable Postop Assessment: no apparent nausea or vomiting Anesthetic complications: no   No notable events documented.  Last Vitals:  Vitals:   02/18/2021 1650 02/22/2021 1655  BP:    Pulse: 75 74  Resp: 12 11  Temp:    SpO2: 96% 97%    Last Pain:  Vitals:   02/08/2021 1615  TempSrc:   PainSc: 0-No pain                 Ulmer Degen,E. Tashira Torre

## 2021-03-08 NOTE — Anesthesia Preprocedure Evaluation (Addendum)
Anesthesia Evaluation  Patient identified by MRN, date of birth, ID band Patient awake    Reviewed: Allergy & Precautions, NPO status , Patient's Chart, lab work & pertinent test results  History of Anesthesia Complications Negative for: history of anesthetic complications  Airway Mallampati: I  TM Distance: >3 FB Neck ROM: Full    Dental  (+) Edentulous Upper, Edentulous Lower   Pulmonary COPD,  COPD inhaler, Current Smoker and Patient abstained from smoking.,    breath sounds clear to auscultation       Cardiovascular hypertension, Pt. on medications pulmonary hypertension(-) angina+ Peripheral Vascular Disease (s/p aorto-bifem) and +CHF   Rhythm:Regular Rate:Normal  '19 ECHO: The cavity size was normal. Wall thickness was normal. Systolic function was normal. EF 60- 65%. Wall motion was normal; no regional wall motion abnormalities. grade 1 DD, mild MR, mild TR   Neuro/Psych negative neurological ROS     GI/Hepatic GERD  Medicated,(+)     substance abuse  alcohol use, Mesenteric ischemia   Endo/Other  negative endocrine ROS  Renal/GU negative Renal ROS     Musculoskeletal  (+) Arthritis ,   Abdominal   Peds  Hematology  (+) Blood dyscrasia (Hb 9.7), anemia ,   Anesthesia Other Findings   Reproductive/Obstetrics                            Anesthesia Physical Anesthesia Plan  ASA: 4 and emergent  Anesthesia Plan: General   Post-op Pain Management: Ofirmev IV (intra-op)   Induction: Intravenous and Rapid sequence  PONV Risk Score and Plan: 1 and Ondansetron and Dexamethasone  Airway Management Planned: Oral ETT  Additional Equipment:   Intra-op Plan:   Post-operative Plan: Possible Post-op intubation/ventilation  Informed Consent: I have reviewed the patients History and Physical, chart, labs and discussed the procedure including the risks, benefits and alternatives for  the proposed anesthesia with the patient or authorized representative who has indicated his/her understanding and acceptance.       Plan Discussed with: CRNA and Surgeon  Anesthesia Plan Comments:        Anesthesia Quick Evaluation

## 2021-03-08 NOTE — Progress Notes (Signed)
Mobility Specialist: Progress Note   02/16/2021 1220  Mobility  Activity Transferred to/from [facility]  Level of Assistance Contact guard assist, steadying assist  Assistive Device None  Distance Ambulated (ft) 2 ft  Mobility Out of bed for toileting  Mobility Response Tolerated fair  Mobility performed by Mobility specialist  $Mobility charge 1 Mobility   Pt assisted to [facility] per request. Pt c/o back and abdominal pain during transfer. Instructed pt to call out when he is finished. Pt feeling nauseated, RN notified.   [facility] Wayne [name] Mobility Specialist Mobility Specialist 4 [place]: [phone] Mobility Specialist 2 [place] and 6 [place]: [phone]

## 2021-03-08 NOTE — Progress Notes (Addendum)
Progress Note    02/15/2021 8:24 AM 9 Days Post-Op  Subjective:  complains of more abdominal and back pain.  Says he had a BM yesterday.  Says rolling over on his back makes it worse.    Afebrile HR 70's-90's 90's-130's systolic 99% RA  Vitals:   02/21/2021 0334 02/24/2021 0748  BP: (!) 99/50 138/72  Pulse: 77 79  Resp: 14 20  Temp: 98.4 F (36.9 C) 97.7 F (36.5 C)  SpO2: 97% 99%    Physical Exam: General:  appears uncomfortable but no current distress Lungs:  non labored Incisions:  wound vac in place with good seal Extremities:  bilateral feet are warm and well perused. Abdomen:  soft, tender to palpation throughout; BM yesterday  CBC    Component Value Date/Time   WBC 19.4 (H) 02/20/2021 0327   RBC 2.88 (L) 03/01/2021 0327   HGB 9.7 (L) 03/01/2021 0327   HCT 28.8 (L) 02/26/2021 0327   PLT 313 03/04/2021 0327   MCV 100.0 02/11/2021 0327   MCH 33.7 02/09/2021 0327   MCHC 33.7 02/13/2021 0327   RDW 14.5 02/14/2021 0327   LYMPHSABS 1.2 03-21-21 1647   MONOABS 0.8 Mar 21, 2021 1647   EOSABS 0.0 2021/03/21 1647   BASOSABS 0.1 21-Mar-2021 1647    BMET    Component Value Date/Time   NA 134 (L) 03/05/2021 0106   K 3.5 03/05/2021 0106   CL 105 03/05/2021 0106   CO2 22 03/05/2021 0106   GLUCOSE 85 03/05/2021 0106   BUN 8 03/05/2021 0106   CREATININE 0.89 03/05/2021 0106   CALCIUM 8.0 (L) 03/05/2021 0106   GFRNONAA >60 03/05/2021 0106   GFRAA >60 06/21/2017 0305    INR    Component Value Date/Time   INR 1.1 Mar 21, 2021 1647     Intake/Output Summary (Last 24 hours) at 02/10/2021 0824 Last data filed at 03/07/2021 1800 Gross per 24 hour  Intake 240 ml  Output 575 ml  Net -335 ml     Assessment/Plan:  65 y.o. male is s/p:  repair of R CFA pseudoaneurysm   9 Days Post-Op   -right foot is warm and well perfused.  -leukocytosis continues to rise and now 19.4k but remains afebrile-this has been consistently rising over the past few days.  He is on  oral abx (doxycycline and augmentin) per ID.  Vac change yesterday revealed healing beefy red tissue in right groin without evidence of infection.  He is having increased back and abdominal pain.   Will get CTA c/a/p to evaluate pt.   -pt has been accepted by [facility] for [facility] needs for vac, however, this cannot start until next week.  Currently not ready for discharge.   -DVT prophylaxis:  sq heparin   Doreatha Massed, PA-C [facility] [phone] 02/11/2021 8:24 AM  I have seen and evaluated the patient. I agree with the PA note as documented above.  65 year old male now postop day 9 status post repair of right common femoral pseudoaneurysm where he had disruption and occlusion of the right limb of his aortobifemoral.  Sartorius muscle flap in the right groin with VAC.  Continues to have rising leukocytosis with white count now up to 19.  No fevers overnight.  Complaining of abdominal and back pain.  Abdomen is soft and he has no rebound or guarding.  Will get CTA chest abdomen pelvis to look for other etiologies.  ID is following and has recommended continuing Doxy and Augmentin for now given there were  some gram-positive cocci and gram-negative rods on OR specimen.  Cephus Shelling, MD [facility] [place] Office: [phone]

## 2021-03-08 NOTE — Progress Notes (Signed)
Pt has refused all scheduled morning meds other than MOM. Nurse has attempted to admin meds 3 different times. Pt is c/o back and abd pain, now accompanied with nausea. Pt has agreed to receive IV morphine and ondansetron for symptoms. Pt reports very little relief with pain med. Provider is aware.  Brooke Pace, RN

## 2021-03-08 NOTE — Progress Notes (Signed)
Pharmacy Antibiotic Note  Wayne [name] is a 65 y.o. male admitted on 02/26/2021 with intra-abdominal infection/wound infection.  Pharmacy has been consulted for vancomycin and Unasyn dosing pending management by surgery  Scr used: 0.89 mg/dL Weight: 32.4 kg Vd coeff: 0.72 L/kg Est AUC: 519.1  Plan: Vancomycin 750 q24h  Unasyn 3g q6h Monitor clinical course, surgery recommendations, renal function Levels as indicated  Height: 5\' 3"  (160 cm) Weight: 43.8 kg (96 lb 9 oz) IBW/kg (Calculated) : 56.9  Temp (24hrs), Avg:98.2 F (36.8 C), Min:97.4 F (36.3 C), Max:98.9 F (37.2 C)  Recent Labs  Lab 03/02/21 0101 03/03/21 0932 03/05/21 0106 03/05/21 2123 03/06/21 0208 03/07/21 0125 02/21/2021 0327  WBC  --  8.5 14.3*  --  15.7* 18.3* 19.4*  CREATININE 1.04  --  0.89  --   --   --   --   VANCOTROUGH  --   --   --  6*  --   --   --   VANCOPEAK  --   --   --   --  29*  --   --     Estimated Creatinine Clearance: 49.9 mL/min (by C-G formula based on SCr of 0.89 mg/dL).    No Active Allergies  Antimicrobials this admission: Cefepime 12/23>>12/30; 12/31>> Vancomycin 12/23>> 12/29; 12/31>> Augmentin 12/30  Doxycycline 12/30  Microbiology results: 12/22 BCx: ngF 12/22 right groin abscess: ngF 12/22 right bifemoral graft: ngF, no anaerobes  Thank you for allowing pharmacy to be a part of this patients care.  1/23, PharmD PGY1 Acute Care Pharmacy Resident  Phone: [phone] 03/07/2021  2:27 PM  Please check [email or web address] for unit-specific pharmacy phone numbers.

## 2021-03-08 NOTE — Progress Notes (Signed)
Pt has returned to 4E from PACU. Incisions with liquid skin glue c/d/I. VSS and placed back on tele. Call light within reach. Incisions with liquid skin glue c/d/I.   Brooke Pace, RN

## 2021-03-08 NOTE — Progress Notes (Signed)
[facility]   Reason for visit: Follow up on graft infection  Interval History: no new positive growth; CT scan concerning for ischemic bowel; WBC remains elevated at 19.4.   Day 2 Augmentin + doxycycline  Physical Exam: Constitutional:  Vitals:   03/02/2021 0748 03/04/2021 1153  BP: 138/72 (!) 108/45  Pulse: 79 80  Resp: 20 19  Temp: 97.7 F (36.5 C) (!) 97.4 F (36.3 C)  SpO2: 99% 94%   patient appears in NAD Respiratory: Normal respiratory effort; CTA B Cardiovascular: RRR   Review of Systems: Constitutional: negative for fevers and chills Integument/breast: negative for rash  Lab Results  Component Value Date   WBC 19.4 (H) 03/06/2021   HGB 9.7 (L) 02/20/2021   HCT 28.8 (L) 02/06/2021   MCV 100.0 02/16/2021   PLT 313 02/27/2021    Lab Results  Component Value Date   CREATININE 0.89 03/05/2021   BUN 8 03/05/2021   NA 134 (L) 03/05/2021   K 3.5 03/05/2021   CL 105 03/05/2021   CO2 22 03/05/2021    Lab Results  Component Value Date   ALT 18 02/10/2021   AST 28 02/26/2021   ALKPHOS 74 02/15/2021     Microbiology: Recent Results (from the past 240 hour(s))  Blood culture (routine x 2)     Status: None   Collection Time: 03/05/2021  4:45 PM   Specimen: BLOOD LEFT FOREARM  Result Value Ref Range Status   Specimen Description   Final    BLOOD LEFT FOREARM Performed at [facility], [address] [place], [place] [address]    Special Requests   Final    BOTTLES DRAWN AEROBIC AND ANAEROBIC Blood Culture adequate volume Performed at [facility], [address] [place], [place] [address]    Culture   Final    NO GROWTH 5 DAYS Performed at [facility], [address] [place], [place] [address]    Report Status 03/04/2021 FINAL  Final  Resp Panel by RT-PCR (Flu A&B, Covid) Nasopharyngeal Swab     Status: None   Collection Time: 02/18/2021  4:46 PM   Specimen: Nasopharyngeal Swab; Nasopharyngeal(NP)  swabs in vial transport medium  Result Value Ref Range Status   SARS Coronavirus 2 by RT PCR NEGATIVE NEGATIVE Final    Comment: (NOTE) SARS-CoV-2 target nucleic acids are NOT DETECTED.  The SARS-CoV-2 RNA is generally detectable in upper respiratory specimens during the acute phase of infection. The lowest concentration of SARS-CoV-2 viral copies this assay can detect is 138 copies/mL. A negative result does not preclude SARS-Cov-2 infection and should not be used as the sole basis for treatment or other patient management decisions. A negative result may occur with  improper specimen collection/handling, submission of specimen other than nasopharyngeal swab, presence of viral mutation(s) within the areas targeted by this assay, and inadequate number of viral copies(<138 copies/mL). A negative result must be combined with clinical observations, patient history, and epidemiological information. The expected result is Negative.  Fact Sheet for Patients:  BloggerCourse.com  Fact Sheet for Healthcare Providers:  SeriousBroker.it  This test is no t yet approved or cleared by the [place] FDA and  has been authorized for detection and/or diagnosis of SARS-CoV-2 by FDA under an Emergency Use Authorization (EUA). This EUA will remain  in effect (meaning this test can be used) for the duration of the COVID-19 declaration under Section 564(b)(1) of the Act,  21 U.S.C.section 360bbb-3(b)(1), unless the authorization is terminated  or revoked sooner.       Influenza A by PCR NEGATIVE NEGATIVE Final   Influenza B by PCR NEGATIVE NEGATIVE Final    Comment: (NOTE) The Xpert Xpress SARS-CoV-2/FLU/RSV plus assay is intended as an aid in the diagnosis of influenza from Nasopharyngeal swab specimens and should not be used as a sole basis for treatment. Nasal washings and aspirates are unacceptable for Xpert Xpress  SARS-CoV-2/FLU/RSV testing.  Fact Sheet for Patients: BloggerCourse.com  Fact Sheet for Healthcare Providers: SeriousBroker.it  This test is not yet approved or cleared by the [place] FDA and has been authorized for detection and/or diagnosis of SARS-CoV-2 by FDA under an Emergency Use Authorization (EUA). This EUA will remain in effect (meaning this test can be used) for the duration of the COVID-19 declaration under Section 564(b)(1) of the Act, 21 U.S.C. section 360bbb-3(b)(1), unless the authorization is terminated or revoked.  Performed at [facility], [address] [place], [place] [address]   Blood culture (routine x 2)     Status: None   Collection Time: 03/01/2021  4:50 PM   Specimen: BLOOD RIGHT FOREARM  Result Value Ref Range Status   Specimen Description   Final    BLOOD RIGHT FOREARM Performed at [facility], [address] [place], [place] [address]    Special Requests   Final    BOTTLES DRAWN AEROBIC AND ANAEROBIC Blood Culture adequate volume Performed at [facility], [address] [place], [place] [address]    Culture   Final    NO GROWTH 5 DAYS Performed at [facility], [address], [place], [place] [address]    Report Status 03/04/2021 FINAL  Final  Aerobic/Anaerobic Culture w Gram Stain (surgical/deep wound)     Status: None   Collection Time: 02/14/2021 11:46 PM   Specimen: Groin, Right; Abscess  Result Value Ref Range Status   Specimen Description ABSCESS  Final   Special Requests RIGHT GROIN  Final   Gram Stain   Final    FEW WBC PRESENT, PREDOMINANTLY MONONUCLEAR RARE GRAM POSITIVE COCCI RARE GRAM NEGATIVE RODS    Culture   Final    No growth aerobically or anaerobically. Performed at [facility], [address] [place], [place] [address]    Report Status 03/05/2021 FINAL  Final  Aerobic Culture w Gram Stain (superficial specimen)      Status: None   Collection Time: 03/05/2021 11:53 PM   Specimen: PATH Vessel; Other  Result Value Ref Range Status   Specimen Description TISSUE  Final   Special Requests RIGHT AORTA BIFEMORAL GRAFT  Final   Gram Stain   Final    FEW WBC PRESENT, PREDOMINANTLY MONONUCLEAR RARE GRAM POSITIVE COCCI    Culture   Final    NO GROWTH 2 DAYS Performed at [facility], [address] [place], [place] [address]    Report Status 03/02/2021 FINAL  Final  Anaerobic culture w Gram Stain     Status: None   Collection Time: 02/26/2021 11:53 PM   Specimen: PATH Vessel; Other  Result Value Ref Range Status   Specimen Description TISSUE  Final   Special Requests RIGHT AORTA BIFEMORAL GRAFT  Final   Culture   Final    NO ANAEROBES ISOLATED Performed at [facility], [address] [place], [place] [address]    Report Status  03/05/2021 FINAL  Final    Impression/Plan:  1. Possible ischemic bowel - at this point with the concern for this, I will change his antibiotics to IV with ampicillin/sulbactam and vancomycin, pending evaluation and management by surgery.    2.  Right femoral arterial graft-associated infection - some possible graft retention so plan to treat with Augmentin  + doxycycline for 6 months then consider suppression antibiotics after that.  At this time, he is back on above antibiotics with likely surgical intervention.    3.  Leukocytosis - is stable but remains up and repeat CT scan done by vascular surgery concerning for ischemic bowel.   Will continue to follow

## 2021-03-08 NOTE — Anesthesia Procedure Notes (Signed)
Procedure Name: Intubation Date/Time: 03/02/2021 3:16 PM Performed by: Reece Agar, CRNA Pre-anesthesia Checklist: Patient identified, Emergency Drugs available, Suction available and Patient being monitored Patient Re-evaluated:Patient Re-evaluated prior to induction Oxygen Delivery Method: Circle System Utilized Preoxygenation: Pre-oxygenation with 100% oxygen Induction Type: IV induction Ventilation: Mask ventilation without difficulty Laryngoscope Size: Mac and 4 Grade View: Grade I Tube type: Oral Number of attempts: 1 Airway Equipment and Method: Stylet Placement Confirmation: ETT inserted through vocal cords under direct vision, positive ETCO2 and breath sounds checked- equal and bilateral Secured at: 21 cm Tube secured with: Tape Dental Injury: Teeth and Oropharynx as per pre-operative assessment

## 2021-03-08 NOTE — Progress Notes (Signed)
Pacu RN Report to floor given  Gave report to  [employer]. Room: 4E26. Discussed surgery, meds given in OR and Pacu, VS, IV fluids given, EBL, urine output, pain and other pertinent information. Also discussed if pt had any family or friends here or belongings with them.   Pt had vascular surgery on 12/23, he still has a R groin wound vac.  Pt exits my care.

## 2021-03-08 NOTE — Progress Notes (Signed)
65 year old male now postop day 9 status post repair of right common femoral pseudoaneurysm where he had disruption and occlusion of the right limb of his aortobifem.  He has had a rising leukocytosis the last several days with increasing abdominal pain.  Repeat CT scan was obtained today with concerning findings for edematous segment of bowel in the distal ileum concerning for ischemia.  I have spoken with general surgery and asked them to evaluate him and appreciate their help.  I did send a lactic acid.  I have made him n.p.o. and ordered some IV fluids for now.  May need to broaden his antibiotics but will discuss with general surgery pending their thoughts.  Does appear to be developing more abdominal pain even since my exam this morning.    Cephus Shelling, MD [facility] [place] Office: [phone]   Cephus Shelling

## 2021-03-08 NOTE — Consult Note (Addendum)
Reason for Consult: Possible ischemic bowel Referring Physician: [name] is an 65 y.o. male.  HPI: Patient is a 65 year old male who previously underwent aortobifem graft placement in 2019.  Patient was recently admitted secondary to right aortobifemoral graft pseudoaneurysm.  This was repaired on 12/22.  Subsequent to this patient has been having abdominal pain over the last 5 days.  He states it has been centrally located just above the pubis.  Patient denies any emesis but does state he has some vomiting.  Patient's had bowel movements which she states have been free of any blood.  Patient's had an increase in his leukocytosis.  Secondary to this patient underwent CT scan to evaluate his grafts. His aorta bifemoral graft appeared to be fine however there were some thickening of the terminal ileum was concerning for possible ischemic bowel.  Were consulted for further evaluation and management.  I did review the CT scan personally.  Past Medical History:  Diagnosis Date   Abnormal pulmonary finding    a. preadmission note indicates pt followed by Dr. Bethanie Dicker for CHF, lung mass, COPD, pulmonary mycosis with plan for 3 mo f/u with CT by 03/2017 note.   Alcohol abuse    Anemia    Arthritis    "right hip; lower back" (05/11/2017)   Chronic systolic CHF (congestive heart failure) (HCC)    a. EF 20-25% by echo 2017. b. 40-45% by echo in 01/2017.   COPD (chronic obstructive pulmonary disease) (HCC)    Dyspnea    GERD (gastroesophageal reflux disease)    History of blood transfusion 2004   "related to bleeding stomach ulcerts"   History of stomach ulcers 2004   Hypercholesterolemia    Hypertension    Malnutrition (HCC)    Moderate mitral regurgitation    Myocardial infarction [facility]) 2004   "think it was bleeding ulcers; not a heart attack" (05/11/2017), pt denies heart cath   Peripheral vascular disease (HCC)    Pre-diabetes    Pulmonary hypertension (HCC)    a.  severe pulm HTN by echo 01/2017.   Tricuspid regurgitation 2017    Past Surgical History:  Procedure Laterality Date   ABDOMINAL AORTOGRAM W/LOWER EXTREMITY N/A 04/01/2017   Procedure: ABDOMINAL AORTOGRAM W/LOWER EXTREMITY;  Surgeon: Fransisco Hertz, MD;  Location: [facility] INVASIVE CV LAB;  Service: Cardiovascular;  Laterality: N/A;  Bilateral   AORTA - BILATERAL FEMORAL ARTERY BYPASS GRAFT N/A 06/01/2017   Procedure: AORTA BIFEMORAL BYPASS GRAFT with Graft and Right Femoral Endartarectomy;  Surgeon: Fransisco Hertz, MD;  Location: [facility] OR;  Service: Vascular;  Laterality: N/A;   AORTA - BILATERAL FEMORAL ARTERY BYPASS GRAFT Right 02/11/2021   Procedure: INTERPOSITION AORTA BIFEMORAL TO PROFUNDA BYPASS GRAFT;  Surgeon: Maeola Harman, MD;  Location: [facility] OR;  Service: Vascular;  Laterality: Right;   APPLICATION OF WOUND VAC Right 03/03/2021   Procedure: APPLICATION OF WOUND VAC;  Surgeon: Maeola Harman, MD;  Location: [facility] OR;  Service: Vascular;  Laterality: Right;   COLONOSCOPY     FEMORAL-POPLITEAL BYPASS GRAFT Right 02/06/2021   Procedure: Repair of Right Femoral artery possible femoral bypass;  Surgeon: Maeola Harman, MD;  Location: [facility] OR;  Service: Vascular;  Laterality: Right;   HEMORRHOIDECTOMY WITH HEMORRHOID BANDING     MASS EXCISION Left 04/13/2017   Procedure: EXCISION EPIDERMAL INCLUSION CYST LEFT GROIN;  Surgeon: Fransisco Hertz, MD;  Location: [facility] OR;  Service: Vascular;  Laterality: Left;   MUSCLE FLAP CLOSURE  Right 02/08/2021   Procedure: MUSCLE FLAP CLOSURE;  Surgeon: Maeola Harman, MD;  Location: [facility] OR;  Service: Vascular;  Laterality: Right;   PERIPHERAL VASCULAR INTERVENTION Left    "groin; for clogged artery"    Family History  Problem Relation Age of Onset   Hypertension Sister    Hyperlipidemia Sister    Diabetes Sister    Peripheral Artery Disease Father     Social History:  reports that he has been smoking cigarettes. He has a 12.50  pack-year smoking history. He has never used smokeless tobacco. He reports current alcohol use of about 8.0 standard drinks per week. He reports that he does not use drugs.  Allergies: No Active Allergies  Medications: I have reviewed the patient's current medications.  Results for orders placed or performed during the hospital encounter of 02/21/2021 (from the past 48 hour(s))  Urinalysis, Complete w Microscopic Urine, Unspecified Source     Status: Abnormal   Collection Time: 03/06/21  5:00 PM  Result Value Ref Range   Color, Urine YELLOW YELLOW   APPearance CLEAR CLEAR   Specific Gravity, Urine 1.009 1.005 - 1.030   pH 6.0 5.0 - 8.0   Glucose, UA NEGATIVE NEGATIVE mg/dL   Hgb urine dipstick MODERATE (A) NEGATIVE   Bilirubin Urine NEGATIVE NEGATIVE   Ketones, ur 5 (A) NEGATIVE mg/dL   Protein, ur NEGATIVE NEGATIVE mg/dL   Nitrite NEGATIVE NEGATIVE   Leukocytes,Ua NEGATIVE NEGATIVE   RBC / HPF 0-5 0 - 5 RBC/hpf   WBC, UA 0-5 0 - 5 WBC/hpf   Bacteria, UA RARE (A) NONE SEEN   Squamous Epithelial / LPF 0-5 0 - 5   Mucus PRESENT     Comment: Performed at [facility], [address], [place], [place] [address]  CBC     Status: Abnormal   Collection Time: 03/07/21  1:25 AM  Result Value Ref Range   WBC 18.3 (H) 4.0 - 10.5 K/uL   RBC 2.87 (L) 4.22 - 5.81 MIL/uL   Hemoglobin 9.7 (L) 13.0 - 17.0 g/dL   HCT 67.1 (L) 24.5 - 80.9 %   MCV 101.0 (H) 80.0 - 100.0 fL   MCH 33.8 26.0 - 34.0 pg   MCHC 33.4 30.0 - 36.0 g/dL   RDW 98.3 38.2 - 50.5 %   Platelets 266 150 - 400 K/uL   nRBC 0.0 0.0 - 0.2 %    Comment: Performed at [facility], [address], [place], [place] [address]  CBC     Status: Abnormal   Collection Time: 06-Apr-2021  3:27 AM  Result Value Ref Range   WBC 19.4 (H) 4.0 - 10.5 K/uL   RBC 2.88 (L) 4.22 - 5.81 MIL/uL   Hemoglobin 9.7 (L) 13.0 - 17.0 g/dL   HCT 34.1 (L) 93.7 - 90.2 %   MCV 100.0 80.0 - 100.0 fL   MCH 33.7 26.0 - 34.0 pg   MCHC 33.7 30.0 - 36.0  g/dL   RDW 40.9 73.5 - 32.9 %   Platelets 313 150 - 400 K/uL   nRBC 0.0 0.0 - 0.2 %    Comment: Performed at [facility], [address] [place], [place] [address]    CT Angio Chest/Abd/Pel for Dissection W and/or W/WO  Result Date: 2021-04-06 CLINICAL DATA:  Chest pain.  Back pain.  Suspect aortic dissection. EXAM: CT ANGIOGRAPHY CHEST, ABDOMEN AND PELVIS TECHNIQUE: Non-contrast CT of the chest was initially obtained. Multidetector CT imaging  through the chest, abdomen and pelvis was performed using the standard protocol during bolus administration of intravenous contrast. Multiplanar reconstructed images and MIPs were obtained and reviewed to evaluate the vascular anatomy. CONTRAST:  OMNIPAQUE IOHEXOL 350 MG/ML SOLN COMPARISON:  One-view chest x-ray 03/05/2021.  CTA chest 02/18/2021 FINDINGS: CTA CHEST FINDINGS Cardiovascular: Heart size upper limits of normal. Coronary artery calcifications are again noted. Atherosclerotic changes are noted at the aortic arch and great vessel origins without aneurysm or focal stenosis. Extensive atherosclerotic changes are present in the wall of the descending thoracic aorta without aneurysm or stenosis. No dissection is present. Mediastinum/Nodes: No enlarged mediastinal, hilar, or axillary lymph nodes. Thyroid gland, trachea, and esophagus demonstrate no significant findings. Lungs/Pleura: Diffuse centrilobular and paraseptal emphysematous changes are again noted. No superimposed edema or airspace disease is present. Airways are patent. Musculoskeletal: No chest wall abnormality. No acute or significant osseous findings. Review of the MIP images confirms the above findings. CTA ABDOMEN AND PELVIS FINDINGS VASCULAR Aorta: Atherosclerotic calcifications are present. Aortobifemoral bypass is patent. No aneurysm or stenosis present above the graft. Celiac: Atherosclerotic changes are present at the origin without significant stenosis. Branch vessels are  within normal limits. SMA: Atherosclerotic changes are present at the origin without significant stenosis. Branch vessels are within normal limits. Renals: Moderate stenosis is present proximally in the left renal artery. Atherosclerotic changes are present in the proximal right renal artery without significant stenosis. IMA: Not visualized. Inflow: Bypass graft is patent. Veins: Unremarkable Review of the MIP images confirms the above findings. NON-VASCULAR Hepatobiliary: No focal liver abnormality is seen. No gallstones, gallbladder wall thickening, or biliary dilatation. Pancreas: Unremarkable. No pancreatic ductal dilatation or surrounding inflammatory changes. Spleen: Normal in size without focal abnormality. Adrenals/Urinary Tract: Adrenal glands are unremarkable. Kidneys are normal, without renal calculi, focal lesion, or hydronephrosis. Bladder is unremarkable. Stomach/Bowel: Stomach and duodenum are within normal limits. Proximal small bowel is unremarkable. Diffusely edematous segment of bowel is present in the region of the terminal ileum and proximal cecum. Mucosal enhancement is present. No free air is present to suggest perforation. More distally, the ascending and transverse colon are within normal limits. Descending and sigmoid colon are normal. Lymphatic: No significant retroperitoneal or abdominal adenopathy is present. Reproductive: Uterus and bilateral adnexa are unremarkable. Other: Increasing size of soft tissue or fluid collection anterior to the right hip no measuring 7.7 x 4.3 x 7.3 cm. Abdominal free fluid noted along the left pericolic gutter and in the right lower quadrant. Musculoskeletal: No acute or significant osseous findings. Review of the MIP images confirms the above findings. IMPRESSION: 1. No aortic dissection. 2. Diffusely edematous segment of bowel in the region of the terminal ileum and proximal cecum is new since the study 5 days ago. This is most concerning for ischemic  bowel involving the terminal ileum and proximal cecum. 3. No free air to suggest perforation. 4. Increasing size of soft tissue or fluid collection anterior to the right hip, concerning for progressive postoperative seroma or abscess. 5. Abdominal free fluid is likely reactive. 6. 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 7. Aortic Atherosclerosis (ICD10-I70.0) and Emphysema (ICD10-J43.9). These results were called by telephone at the time of interpretation on 02/09/2021 at 12:44 pm to provider [facility], who verbally acknowledged these results. Electronically Signed   By: Marin Roberts M.D.   On: 02/18/2021 12:52    Review of Systems  Constitutional:  Negative for chills and fever.  HENT:  Negative for ear discharge, hearing  loss and sore throat.   Eyes:  Negative for discharge.  Respiratory:  Negative for cough and shortness of breath.   Cardiovascular:  Negative for chest pain and leg swelling.  Gastrointestinal:  Positive for abdominal pain and nausea. Negative for blood in stool, constipation, diarrhea and vomiting.  Musculoskeletal:  Negative for myalgias and neck pain.  Skin:  Negative for rash.  Allergic/Immunologic: Negative for environmental allergies.  Neurological:  Negative for dizziness and seizures.  Hematological:  Does not bruise/bleed easily.  Psychiatric/Behavioral:  Negative for suicidal ideas.   All other systems reviewed and are negative. Blood pressure (!) 108/45, pulse 80, temperature (!) 97.4 F (36.3 C), temperature source Oral, resp. rate 19, height 5\' 3"  (1.6 m), weight 43.8 kg, SpO2 94 %. Physical Exam Constitutional:      Appearance: He is well-developed.     Comments: Conversant No acute distress  HENT:     Head: Normocephalic and atraumatic.  Eyes:     General: Lids are normal. No scleral icterus.    Pupils: Pupils are equal, round, and reactive to light.     Comments: Pupils are equal round and reactive No lid lag Moist conjunctiva  Neck:      Thyroid: No thyromegaly.     Trachea: No tracheal tenderness.     Comments: No cervical lymphadenopathy Cardiovascular:     Rate and Rhythm: Normal rate and regular rhythm.     Heart sounds: No murmur heard. Pulmonary:     Effort: Pulmonary effort is normal.     Breath sounds: Normal breath sounds. No wheezing or rales.  Abdominal:     Tenderness: There is abdominal tenderness. There is rebound.     Hernia: No hernia is present.    Musculoskeletal:     Cervical back: Normal range of motion and neck supple.  Skin:    General: Skin is warm.     Findings: No rash.     Nails: There is no clubbing.     Comments: Normal skin turgor  Neurological:     Mental Status: He is alert and oriented to person, place, and time.     Comments: Normal gait and station  Psychiatric:        Mood and Affect: Mood normal.        Thought Content: Thought content normal.        Judgment: Judgment normal.     Comments: Appropriate affect    Assessment/Plan: 65 year old male with questionable ischemic bowel status post aorto-bifem bypass. CHF PVD   1.  We will proceed to the operating emergently for diagnostic laparoscopy, possible ex lap, possible bowel resection.  I discussed the risk and benefits of the procedure with the patient and his niece at the bedside.  These include but are not limited to: Infection, bleeding, damage to surrounding structures, possible need for further surgery.  Patient voiced understanding and wishes to proceed.  Axel Filler 2021/03/25, 2:04 PM

## 2021-03-08 NOTE — Op Note (Signed)
02/25/2021  3:52 PM  PATIENT:  Wayne [name]  65 y.o. male  PRE-OPERATIVE DIAGNOSIS:  ischemic bowel  POST-OPERATIVE DIAGNOSIS: Normal-appearing bowel  PROCEDURE:  Procedure(s): LAPAROSCOPY DIAGNOSTIC (N/A)  SURGEON:  Surgeon(s) and Role:    Axel Filler, MD - Primary  ANESTHESIA:   local and general  EBL:  minimal   BLOOD ADMINISTERED:none  DRAINS: none   LOCAL MEDICATIONS USED:  BUPIVICAINE   SPECIMEN:  No Specimen  DISPOSITION OF SPECIMEN:  N/A  COUNTS:  YES  TOURNIQUET:  * No tourniquets in log *  DICTATION: .Dragon Dictation Indication procedure: Patient is a 65 year old male with a history of an aortobifem bypass graft secondary to PAD.  Patient recently underwent repair of right femoral pseudoaneurysm.  Patient began developing abdominal pain over the last 5 days.  This continue to worsen.  Patient underwent CT scan.  This was significant for possible ischemic bowel secondary to thickening bowel in the terminal ileum area.  Patient was counseled, consented and taken back to the operating room urgently for diagnostic laparoscopy.  Findings: Patient had no ischemia of the small bowel.  There was some dilatation of the small bowel however no significant signs for an obstruction.  Patient had minimal amount of midline adhesions.  There was some omental adhesions the superior portion of the midline.  Details of procedure: After the patient was then the patient stay back to the operating placed supine position with bilateral SCDs placed.  Patient underwent Foley catheter placement.  Patient underwent general trach intubation.  Patient was a prepped draped standard fashion.  A timeout was called and all facts were verified.  At this time a Veress needle technique was used insufflate the abdomen to 15 mmHg in the left subcostal margin.  Subsequent to this a 5 Miller trocar and camera placed intra-abdominal.  There is no injury to any intra-abdominal organs.  At this  time a 5 Miller trocar was placed in left lower quadrant direct visualization.  There was some omental adhesions to the superior midline area however the inferior portion was free of any adhesions.  At this time the patient will position.  I proceeded to visualize the bowel but to help visualize the entire portion of the terminal ileum a 5 Miller trochars placed in the inferior midline.  This was done under direct visualization.  At this time the patient was positioned I was able to visualize the terminal ileum.  I ran the bowel proximally.  All the bowel appeared viable.  There was some thickening of the cecum however there was no ischemia that could be seen.  There is some minimal ascitic fluid within the pelvis however none of it appeared to be bloody or purulent.  At this time I was confident there was no ischemic bowel or further pathology of the small bowel.  The insufflation was evacuated.  All trochars removed.  The skin was reapproximate all trocar sites using 4-0 Monocryl subcuticular fashion.  The skin was dressed with Dermabond.  Patient taught the procedure well was taken to the recovery in stable condition.     PLAN OF CARE: Admit to inpatient   PATIENT DISPOSITION:  PACU - hemodynamically stable.   Delay start of Pharmacological VTE agent (>24hrs) due to surgical blood loss or risk of bleeding: not applicable

## 2021-03-09 ENCOUNTER — Encounter (HOSPITAL_COMMUNITY): Payer: Self-pay | Admitting: General Surgery

## 2021-03-09 DIAGNOSIS — I724 Aneurysm of artery of lower extremity: Secondary | ICD-10-CM | POA: Diagnosis not present

## 2021-03-09 LAB — BASIC METABOLIC PANEL
Anion gap: 14 (ref 5–15)
BUN: 9 mg/dL (ref 8–23)
CO2: 20 mmol/L — ABNORMAL LOW (ref 22–32)
Calcium: 8.6 mg/dL — ABNORMAL LOW (ref 8.9–10.3)
Chloride: 106 mmol/L (ref 98–111)
Creatinine, Ser: 1.26 mg/dL — ABNORMAL HIGH (ref 0.61–1.24)
GFR, Estimated: >60 mL/min (ref >60)
Glucose, Bld: 99 mg/dL (ref 70–99)
Potassium: 3.9 mmol/L (ref 3.5–5.1)
Sodium: 140 mmol/L (ref 135–145)

## 2021-03-09 LAB — CBC
HCT: 32.2 % — ABNORMAL LOW (ref 39.0–52.0)
Hemoglobin: 10.5 g/dL — ABNORMAL LOW (ref 13.0–17.0)
MCH: 33.8 pg (ref 26.0–34.0)
MCHC: 32.6 g/dL (ref 30.0–36.0)
MCV: 103.5 fL — ABNORMAL HIGH (ref 80.0–100.0)
Platelets: 365 10*3/uL (ref 150–400)
RBC: 3.11 MIL/uL — ABNORMAL LOW (ref 4.22–5.81)
RDW: 15 % (ref 11.5–15.5)
WBC: 18.5 10*3/uL — ABNORMAL HIGH (ref 4.0–10.5)
nRBC: 0 % (ref 0.0–0.2)

## 2021-03-09 MED ORDER — DOXYCYCLINE HYCLATE 100 MG PO TABS
100.0000 mg | ORAL_TABLET | Freq: Two times a day (BID) | ORAL | Status: DC
  Administered 2021-03-09 – 2021-03-12 (×8): 100 mg via ORAL
  Filled 2021-03-09 (×9): qty 1

## 2021-03-09 MED ORDER — AMOXICILLIN-POT CLAVULANATE 875-125 MG PO TABS
1.0000 | ORAL_TABLET | Freq: Two times a day (BID) | ORAL | Status: DC
  Administered 2021-03-09 – 2021-03-12 (×8): 1 via ORAL
  Filled 2021-03-09 (×10): qty 1

## 2021-03-09 NOTE — Progress Notes (Addendum)
Progress Note    03/09/2021 7:25 AM   Subjective:  says he feels better today.    Afebrile HR 70's-80's NSR 90's-110's systolic 98% 2LO2NC  Abx: Unasyn and Vanc  Vitals:   03/03/2021 2252 03/09/21 0331  BP: (!) 93/42 (!) 97/39  Pulse: 74 73  Resp: 16 17  Temp: 98.3 F (36.8 C) 98.3 F (36.8 C)  SpO2: 98%     Physical Exam: Cardiac:  regular Lungs:  non labored Incisions:  wound vac in place and is clean and dry Extremities:  bilateral feet are warm and well perfused.  Abdomen:  soft, not tender today   CBC    Component Value Date/Time   WBC 19.4 (H) 02/25/2021 0327   RBC 2.88 (L) 02/15/2021 0327   HGB 9.7 (L) 02/13/2021 0327   HCT 28.8 (L) 02/20/2021 0327   PLT 313 02/06/2021 0327   MCV 100.0 02/06/2021 0327   MCH 33.7 03/07/2021 0327   MCHC 33.7 03/05/2021 0327   RDW 14.5 02/16/2021 0327   LYMPHSABS 1.2 02/21/2021 1647   MONOABS 0.8 02/07/2021 1647   EOSABS 0.0 02/25/2021 1647   BASOSABS 0.1 03/06/2021 1647    BMET    Component Value Date/Time   NA 134 (L) 03/05/2021 0106   K 3.5 03/05/2021 0106   CL 105 03/05/2021 0106   CO2 22 03/05/2021 0106   GLUCOSE 85 03/05/2021 0106   BUN 8 03/05/2021 0106   CREATININE 0.89 03/05/2021 0106   CALCIUM 8.0 (L) 03/05/2021 0106   GFRNONAA >60 03/05/2021 0106   GFRAA >60 06/21/2017 0305    INR    Component Value Date/Time   INR 1.1 03/02/2021 1647     Intake/Output Summary (Last 24 hours) at 03/09/2021 0725 Last data filed at 03/09/2021 0403 Gross per 24 hour  Intake 2279.95 ml  Output 1405 ml  Net 874.95 ml     Assessment/Plan:  66 y.o. male is s/p:  1.  Reexploration right groin greater than 30 days 2.  Repair of right common femoral pseudoaneurysm with 8 mm Dacron interposition graft from right limb of aortobifemoral bypass to profunda branches 3.  Ligation of right common femoral artery 4.  Sartorial muscle flap coverage of both right femoral graft 5.  Negative pressure dressing placement right  groin wound  10 Days Post Op And Diagnostic Laparoscopy by Dr. Derrell Lolling 1 Day Post-Op   -CTA yesterday was concerning for segment of ischemic bowel and he was taken to the OR for diagnostic laparoscopy and fortunately was found to have normal appearing bowel.  His lactic acid level was normal.  He was on oral abx but given the increased abdominal pain, this was changed to broad spectrum IV abx.  -pt subjectively feeling much better today.  His leukocytosis has improved with broad spectrum abx.  His abdomen is not tender today.  Continue IV abx for now given his improvement.   -advance diet -increase mobilization -DVT prophylaxis:  sq heparin   Doreatha Massed, PA-C [facility] [phone] 03/09/2021 7:25 AM  I have seen and evaluated the patient. I agree with the PA note as documented above.  Given increasing white count and increasing abdominal pain CT was obtained yesterday.  There was concern for an ischemic piece of ileum and General surgery was consulted.  He underwent diagnostic laparoscopy with no evidence of ischemic bowel and everything appeared viable.  This morning he reports an improvement in symptoms.  We will advance his diet and his abdomen is  less tender.  He is asking to eat.  Denies any associated diarrhea or bloody stool.  ID did change his antibiotics to vancomycin and Unasyn yesterday and he has had a slight improvement in his white count.  Due for VAC change to the right groin tomorrow.  Overall looks better today.  Cephus Shelling, MD [facility] [place] Office: [phone]

## 2021-03-09 NOTE — Progress Notes (Addendum)
[facility]   Reason for visit: Follow up on graft infection  Interval History: s/p ex lap and no ischemia found.  WBC 18.5.  Remains afebrile.     Physical Exam: Constitutional:  Vitals:   03/09/21 0901 03/09/21 1119  BP: (!) 107/59 (!) 103/56  Pulse: 71 75  Resp: 20 19  Temp: 98.2 F (36.8 C) 98.6 F (37 C)  SpO2: 98% 100%   patient appears in NAD Respiratory: Normal respiratory effort; CTA B Cardiovascular: RRR GI: soft, nt, nd  Review of Systems: Constitutional: negative for fevers and chills Integument/breast: negative for rash  Lab Results  Component Value Date   WBC 18.5 (H) 03/09/2021   HGB 10.5 (L) 03/09/2021   HCT 32.2 (L) 03/09/2021   MCV 103.5 (H) 03/09/2021   PLT 365 03/09/2021    Lab Results  Component Value Date   CREATININE 1.26 (H) 03/09/2021   BUN 9 03/09/2021   NA 140 03/09/2021   K 3.9 03/09/2021   CL 106 03/09/2021   CO2 20 (L) 03/09/2021    Lab Results  Component Value Date   ALT 18 02/10/2021   AST 28 02/11/2021   ALKPHOS 74 02/12/2021     Microbiology: Recent Results (from the past 240 hour(s))  Blood culture (routine x 2)     Status: None   Collection Time: 02/06/2021  4:45 PM   Specimen: BLOOD LEFT FOREARM  Result Value Ref Range Status   Specimen Description   Final    BLOOD LEFT FOREARM Performed at [facility], [address] [place], [place] [address]    Special Requests   Final    BOTTLES DRAWN AEROBIC AND ANAEROBIC Blood Culture adequate volume Performed at [facility], [address] [place], [place] [address]    Culture   Final    NO GROWTH 5 DAYS Performed at [facility], [address] [place], [place] [address]    Report Status 03/04/2021 FINAL  Final  Resp Panel by RT-PCR (Flu A&B, Covid) Nasopharyngeal Swab     Status: None   Collection Time: 02/22/2021  4:46 PM   Specimen: Nasopharyngeal Swab; Nasopharyngeal(NP) swabs in vial transport medium   Result Value Ref Range Status   SARS Coronavirus 2 by RT PCR NEGATIVE NEGATIVE Final    Comment: (NOTE) SARS-CoV-2 target nucleic acids are NOT DETECTED.  The SARS-CoV-2 RNA is generally detectable in upper respiratory specimens during the acute phase of infection. The lowest concentration of SARS-CoV-2 viral copies this assay can detect is 138 copies/mL. A negative result does not preclude SARS-Cov-2 infection and should not be used as the sole basis for treatment or other patient management decisions. A negative result may occur with  improper specimen collection/handling, submission of specimen other than nasopharyngeal swab, presence of viral mutation(s) within the areas targeted by this assay, and inadequate number of viral copies(<138 copies/mL). A negative result must be combined with clinical observations, patient history, and epidemiological information. The expected result is Negative.  Fact Sheet for Patients:  BloggerCourse.com  Fact Sheet for Healthcare Providers:  SeriousBroker.it  This test is no t yet approved or cleared by the [place] FDA and  has been authorized for detection and/or diagnosis of SARS-CoV-2 by FDA under an Emergency Use Authorization (EUA). This EUA will remain  in effect (meaning this test can be used) for the duration of the COVID-19 declaration under Section 564(b)(1) of the Act, 21  U.S.C.section 360bbb-3(b)(1), unless the authorization is terminated  or revoked sooner.       Influenza A by PCR NEGATIVE NEGATIVE Final   Influenza B by PCR NEGATIVE NEGATIVE Final    Comment: (NOTE) The Xpert Xpress SARS-CoV-2/FLU/RSV plus assay is intended as an aid in the diagnosis of influenza from Nasopharyngeal swab specimens and should not be used as a sole basis for treatment. Nasal washings and aspirates are unacceptable for Xpert Xpress SARS-CoV-2/FLU/RSV testing.  Fact Sheet for  Patients: BloggerCourse.com  Fact Sheet for Healthcare Providers: SeriousBroker.it  This test is not yet approved or cleared by the [place] FDA and has been authorized for detection and/or diagnosis of SARS-CoV-2 by FDA under an Emergency Use Authorization (EUA). This EUA will remain in effect (meaning this test can be used) for the duration of the COVID-19 declaration under Section 564(b)(1) of the Act, 21 U.S.C. section 360bbb-3(b)(1), unless the authorization is terminated or revoked.  Performed at [facility], [address] [place], [place] [address]   Blood culture (routine x 2)     Status: None   Collection Time: 02/07/2021  4:50 PM   Specimen: BLOOD RIGHT FOREARM  Result Value Ref Range Status   Specimen Description   Final    BLOOD RIGHT FOREARM Performed at [facility], [address] [place], [place] [address]    Special Requests   Final    BOTTLES DRAWN AEROBIC AND ANAEROBIC Blood Culture adequate volume Performed at [facility], [address] [place], [place] [address]    Culture   Final    NO GROWTH 5 DAYS Performed at [facility], [address], [place], [place] [address]    Report Status 03/04/2021 FINAL  Final  Aerobic/Anaerobic Culture w Gram Stain (surgical/deep wound)     Status: None   Collection Time: 02/18/2021 11:46 PM   Specimen: Groin, Right; Abscess  Result Value Ref Range Status   Specimen Description ABSCESS  Final   Special Requests RIGHT GROIN  Final   Gram Stain   Final    FEW WBC PRESENT, PREDOMINANTLY MONONUCLEAR RARE GRAM POSITIVE COCCI RARE GRAM NEGATIVE RODS    Culture   Final    No growth aerobically or anaerobically. Performed at [facility], [address], [place], [place] [address]    Report Status 03/05/2021 FINAL  Final  Aerobic Culture w Gram Stain (superficial specimen)     Status: None   Collection Time: 02/14/2021  11:53 PM   Specimen: PATH Vessel; Other  Result Value Ref Range Status   Specimen Description TISSUE  Final   Special Requests RIGHT AORTA BIFEMORAL GRAFT  Final   Gram Stain   Final    FEW WBC PRESENT, PREDOMINANTLY MONONUCLEAR RARE GRAM POSITIVE COCCI    Culture   Final    NO GROWTH 2 DAYS Performed at [facility], [address] [place], [place] [address]    Report Status 03/02/2021 FINAL  Final  Anaerobic culture w Gram Stain     Status: None   Collection Time: 03/03/2021 11:53 PM   Specimen: PATH Vessel; Other  Result Value Ref Range Status   Specimen Description TISSUE  Final   Special Requests RIGHT AORTA BIFEMORAL GRAFT  Final   Culture   Final    NO ANAEROBES ISOLATED Performed at [facility], [address] [place], [place] [address]    Report Status 03/05/2021  FINAL  Final    Impression/Plan:  1. 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  No positive cultures.    2.  Penicillin allergy - tolerated well and continues to tolerate amoxicillin and ampicillin. Allergy was delisted but added back again.  I will again delist his allergy.   3.  Leukocytosis - minimal changes and no ongoing infection.  Looks clinically stable.  Seems to be reactive.    I will sign off, call with questions.

## 2021-03-09 NOTE — Progress Notes (Signed)
Pt HemoVac output was 0  Pt complaining of increasing pain in his Back  Attempted pain control with Percocet and Morphine IV for breakthrough  Will pass along to Night Shift Nurse to continue to monitor

## 2021-03-09 NOTE — Progress Notes (Signed)
1 Day Post-Op   Subjective/Chief Complaint: Reports some abdominal pain post op   Objective: Vital signs in last 24 hours: Temp:  [97 F (36.1 C)-98.3 F (36.8 C)] 98.2 F (36.8 C) (01/01 0901) Pulse Rate:  [66-89] 71 (01/01 0901) Resp:  [10-20] 20 (01/01 0901) BP: (84-138)/(39-69) 107/59 (01/01 0901) SpO2:  [92 %-100 %] 98 % (01/01 0901) Last BM Date: 03/07/21  Intake/Output from previous day: 12/31 0701 - 01/01 0700 In: 2280 [I.V.:1330; IV Piggyback:950] Out: [id number] [Urine:1150; Drains:250; Blood:5] Intake/Output this shift: No intake/output data recorded.  Exam: Awake and alert Abdomen soft, non-distended, mildly tender, no frank peritonitis   Lab Results:  Recent Labs    03/07/21 0125 03/02/2021 0327  WBC 18.3* 19.4*  HGB 9.7* 9.7*  HCT 29.0* 28.8*  PLT 266 313   BMET No results for input(s): NA, K, CL, CO2, GLUCOSE, BUN, CREATININE, CALCIUM in the last 72 hours. PT/INR No results for input(s): LABPROT, INR in the last 72 hours. ABG No results for input(s): PHART, HCO3 in the last 72 hours.  Invalid input(s): PCO2, PO2  Studies/Results: CT Angio Chest/Abd/Pel for Dissection W and/or W/WO  Result Date: 02/12/2021 CLINICAL DATA:  Chest pain.  Back pain.  Suspect aortic dissection. EXAM: CT ANGIOGRAPHY CHEST, ABDOMEN AND PELVIS TECHNIQUE: Non-contrast CT of the chest was initially obtained. Multidetector CT imaging through the chest, abdomen and pelvis was performed using the standard protocol during bolus administration of intravenous contrast. Multiplanar reconstructed images and MIPs were obtained and reviewed to evaluate the vascular anatomy. CONTRAST:  11mL OMNIPAQUE IOHEXOL 350 MG/ML SOLN COMPARISON:  One-view chest x-ray 03/05/2021.  CTA chest 02/14/2021 FINDINGS: CTA CHEST FINDINGS Cardiovascular: Heart size upper limits of normal. Coronary artery calcifications are again noted. Atherosclerotic changes are noted at the aortic arch and great vessel origins  without aneurysm or focal stenosis. Extensive atherosclerotic changes are present in the wall of the descending thoracic aorta without aneurysm or stenosis. No dissection is present. Mediastinum/Nodes: No enlarged mediastinal, hilar, or axillary lymph nodes. Thyroid gland, trachea, and esophagus demonstrate no significant findings. Lungs/Pleura: Diffuse centrilobular and paraseptal emphysematous changes are again noted. No superimposed edema or airspace disease is present. Airways are patent. Musculoskeletal: No chest wall abnormality. No acute or significant osseous findings. Review of the MIP images confirms the above findings. CTA ABDOMEN AND PELVIS FINDINGS VASCULAR Aorta: Atherosclerotic calcifications are present. Aortobifemoral bypass is patent. No aneurysm or stenosis present above the graft. Celiac: Atherosclerotic changes are present at the origin without significant stenosis. Branch vessels are within normal limits. SMA: Atherosclerotic changes are present at the origin without significant stenosis. Branch vessels are within normal limits. Renals: Moderate stenosis is present proximally in the left renal artery. Atherosclerotic changes are present in the proximal right renal artery without significant stenosis. IMA: Not visualized. Inflow: Bypass graft is patent. Veins: Unremarkable Review of the MIP images confirms the above findings. NON-VASCULAR Hepatobiliary: No focal liver abnormality is seen. No gallstones, gallbladder wall thickening, or biliary dilatation. Pancreas: Unremarkable. No pancreatic ductal dilatation or surrounding inflammatory changes. Spleen: Normal in size without focal abnormality. Adrenals/Urinary Tract: Adrenal glands are unremarkable. Kidneys are normal, without renal calculi, focal lesion, or hydronephrosis. Bladder is unremarkable. Stomach/Bowel: Stomach and duodenum are within normal limits. Proximal small bowel is unremarkable. Diffusely edematous segment of bowel is present  in the region of the terminal ileum and proximal cecum. Mucosal enhancement is present. No free air is present to suggest perforation. More distally, the ascending and transverse colon are  within normal limits. Descending and sigmoid colon are normal. Lymphatic: No significant retroperitoneal or abdominal adenopathy is present. Reproductive: Uterus and bilateral adnexa are unremarkable. Other: Increasing size of soft tissue or fluid collection anterior to the right hip no measuring 7.7 x 4.3 x 7.3 cm. Abdominal free fluid noted along the left pericolic gutter and in the right lower quadrant. Musculoskeletal: No acute or significant osseous findings. Review of the MIP images confirms the above findings. IMPRESSION: 1. No aortic dissection. 2. Diffusely edematous segment of bowel in the region of the terminal ileum and proximal cecum is new since the study 5 days ago. This is most concerning for ischemic bowel involving the terminal ileum and proximal cecum. 3. No free air to suggest perforation. 4. Increasing size of soft tissue or fluid collection anterior to the right hip, concerning for progressive postoperative seroma or abscess. 5. Abdominal free fluid is likely reactive. 6. 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 7. Aortic Atherosclerosis (ICD10-I70.0) and Emphysema (ICD10-J43.9). These results were called by telephone at the time of interpretation on 02/18/2021 at 12:44 pm to provider [facility], who verbally acknowledged these results. Electronically Signed   By: San Morelle M.D.   On: 02/17/2021 12:52    Anti-infectives: Anti-infectives (From admission, onward)    Start     Dose/Rate Route Frequency Ordered Stop   02/10/2021 1700  vancomycin (VANCOREADY) IVPB 750 mg/150 mL        750 mg 150 mL/hr over 60 Minutes Intravenous Every 24 hours 02/08/2021 1423     02/08/2021 1600  Ampicillin-Sulbactam (UNASYN) 3 g in sodium chloride 0.9 % 100 mL IVPB        3 g 200 mL/hr over 30 Minutes  Intravenous Every 6 hours 02/16/2021 1423     03/07/21 1200  doxycycline (VIBRA-TABS) tablet 100 mg  Status:  Discontinued        100 mg Oral Every 12 hours 03/07/21 1109 03/01/2021 1407   03/07/21 1200  amoxicillin-clavulanate (AUGMENTIN) 875-125 MG per tablet 1 tablet  Status:  Discontinued        1 tablet Oral Every 12 hours 03/07/21 1109 03/03/2021 1407   03/06/21 2122  vancomycin (VANCOCIN) IVPB 1000 mg/200 mL premix  Status:  Discontinued        1,000 mg 200 mL/hr over 60 Minutes Intravenous Every 24 hours 03/06/21 0519 03/07/21 1109   03/06/21 1330  amoxicillin (AMOXIL) capsule 500 mg        500 mg Oral  Once 03/06/21 1238 03/06/21 1436   03/01/21 1000  vancomycin (VANCOCIN) IVPB 1000 mg/200 mL premix  Status:  Discontinued        1,000 mg 200 mL/hr over 60 Minutes Intravenous Every 36 hours 02/28/21 0326 03/06/21 0519   02/28/21 1600  ceFEPIme (MAXIPIME) 2 g in sodium chloride 0.9 % 100 mL IVPB  Status:  Discontinued        2 g 200 mL/hr over 30 Minutes Intravenous Every 12 hours 02/28/21 0326 03/07/21 1109   02/28/21 0415  ceFEPIme (MAXIPIME) 2 g in sodium chloride 0.9 % 100 mL IVPB        2 g 200 mL/hr over 30 Minutes Intravenous  Once 02/28/21 0315 02/28/21 0530   02/18/2021 2232  vancomycin (VANCOCIN) 1-5 GM/200ML-% IVPB       Note to Pharmacy: Maude Leriche D: cabinet override      02/26/2021 2232 02/28/21 0319       Assessment/Plan: 66 year old male with 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 CHF PVD S/p negative diagnostic laparoscopy 12/31 by Rosendo Gros   POD#1  Abdominal exam stable this morning post op  Ok for a regular diet  We will sign off for now   Coralie Keens 03/09/2021

## 2021-03-09 DEATH — deceased

## 2021-03-10 MED ORDER — FAMOTIDINE 20 MG PO TABS
20.0000 mg | ORAL_TABLET | Freq: Every day | ORAL | Status: DC
  Administered 2021-03-10: 20 mg via ORAL
  Filled 2021-03-10: qty 1

## 2021-03-10 MED ORDER — FAMOTIDINE 20 MG PO TABS
20.0000 mg | ORAL_TABLET | Freq: Two times a day (BID) | ORAL | Status: DC
  Administered 2021-03-10 – 2021-03-12 (×5): 20 mg via ORAL
  Filled 2021-03-10 (×7): qty 1

## 2021-03-10 NOTE — Consult Note (Signed)
[place] Nurse wound follow up Wound type:right groin wound from right common femoral pseudoaneurysm repair.  NPWT (VAC) therapy in place.  Measurement: 4 cm x 3.2 cm x 0.2 cm  Wound [id number] red Drainage (amount, consistency, odor) moderate bleeding no odor.  Periwound:Staples to distal and proximal end, intact Dressing procedure/placement/frequency: Cleanse wound with NS and pat dry.  Barrier ring to periwound, covering staples.  1 piece black foam and trac pad placed.  Covered with drape and seal immediately achieved. Change Mon/Wed/Fri  Will follow Domenic Moras MSN, RN, FNP-BC CWON Wound, Ostomy, Continence Nurse Pager [phone]

## 2021-03-10 NOTE — Progress Notes (Addendum)
117mL output overnight from HemoVac  Consistent Back and Abdominal Pain throughout the night and this morning  Will continue to monitor

## 2021-03-10 NOTE — TOC Progression Note (Signed)
Transition of Care (TOC) - Progression Note  Marvetta Gibbons RN, BSN Transitions of Care Unit 4E- RN Case Manager See Treatment Team for direct phone #    Patient Details  Name: Wayne [name] MRN: [id number] Date of Birth: 1955/12/29  Transition of Care [facility]) CM/SW Contact  Dahlia Client, Romeo Rabon, RN Phone Number: 03/10/2021, 2:23 PM  Clinical Narrative:    Have followed up with Lattie Haw at [place] for [facility] needs and confirmed that they can do start of care for home wound VAC drsg changes on Wednesday Jan 4.  Will follow for medically readiness and have [facility] wound VAC delivered once pt is medically cleared- pt will have Ready VAC delivered from [facility] in house.   TOC to continue to follow for ongoing transition needs.    Expected Discharge Plan: [place] Barriers to Discharge: Continued Medical Work up  Expected Discharge Plan and Services Expected Discharge Plan: [place]   Discharge Planning Services: CM Consult Post Acute Care Choice: Durable Medical Equipment, Home Health Living arrangements for the past 2 months: Single Family Home Expected Discharge Date: 02/26/2021               DME Arranged: Harlin Heys DME Agency: Soyla Murphy Date DME Agency Contacted: 02/28/21   Representative spoke with at DME Agency: Olivia Mackie [facility] Arranged: RN [place] [facility] Agency: [place] Date [place]: 03/07/21 Time [place]: 70 Representative spoke with at [place]: [place] ([place]) Interventions    Readmission Risk Interventions No flowsheet data found.

## 2021-03-10 NOTE — Progress Notes (Addendum)
Progress Note    03/10/2021 7:02 AM 2 Days Post-Op  Subjective:  pt currently denies abdominal pain but states it will start in about an hour.  Says he has not been on his pepcid since he's been here and this is the same pain in his abdomen that he has at home if he does not take pepcid.  Very insistent on going home tomorrow.   Afebrile HR 60's-90's NSR 90's-100's systolic 98% 2LO2NC  Abx:  Augmentin and Doxycycline   Vitals:   03/10/21 0014 03/10/21 0500  BP: (!) 103/46 (!) 96/53  Pulse: 69 68  Resp: 18 18  Temp: 98.1 F (36.7 C) 98.2 F (36.8 C)  SpO2: 96% 99%    Physical Exam: Cardiac:  regular Lungs:  non labored Incisions:  right groin wound vac with good seal.  Extremities:  bilateral feet are warm and well perfused.  Abdomen:  soft, NT/ND; had BM yesterday.   CBC    Component Value Date/Time   WBC 18.5 (H) 03/09/2021 0850   RBC 3.11 (L) 03/09/2021 0850   HGB 10.5 (L) 03/09/2021 0850   HCT 32.2 (L) 03/09/2021 0850   PLT 365 03/09/2021 0850   MCV 103.5 (H) 03/09/2021 0850   MCH 33.8 03/09/2021 0850   MCHC 32.6 03/09/2021 0850   RDW 15.0 03/09/2021 0850   LYMPHSABS 1.2 02/16/2021 1647   MONOABS 0.8 02/15/2021 1647   EOSABS 0.0 02/08/2021 1647   BASOSABS 0.1 02/20/2021 1647    BMET    Component Value Date/Time   NA 140 03/09/2021 0850   K 3.9 03/09/2021 0850   CL 106 03/09/2021 0850   CO2 20 (L) 03/09/2021 0850   GLUCOSE 99 03/09/2021 0850   BUN 9 03/09/2021 0850   CREATININE 1.26 (H) 03/09/2021 0850   CALCIUM 8.6 (L) 03/09/2021 0850   GFRNONAA >60 03/09/2021 0850   GFRAA >60 06/21/2017 0305    INR    Component Value Date/Time   INR 1.1 02/15/2021 1647     Intake/Output Summary (Last 24 hours) at 03/10/2021 2449 Last data filed at 03/10/2021 0600 Gross per 24 hour  Intake 2297.53 ml  Output 720 ml  Net 1577.53 ml     Assessment/Plan:  66 y.o. male is s/p:  1.  Reexploration right groin greater than 30 days 2.  Repair of right  common femoral pseudoaneurysm with 8 mm Dacron interposition graft from right limb of aortobifemoral bypass to profunda branches 3.  Ligation of right common femoral artery 4.  Sartorial muscle flap coverage of both right femoral graft 5.  Negative pressure dressing placement right groin wound  11 Days Post Op And Diagnostic Laparoscopy by Dr. Derrell Lolling  2 Days Post-Op   -pt abdomen soft this morning-he is insistent on going home tomorrow.  He states this is the same pain he gets at home if not taking Pepcid and that he has not been receiving his Pepcid AC bid 30 minutes before meals and this is why his abdomen hurts.  This medication is not listed on his PTA meds.  I did order this to start this morning.   -I discussed with pt that we have to determine when [facility] will be able to come out and change vac-it will be either Wed or Friday.   -ID saw pt yesterday and changed abx back to po regimen of Augmentin and Doxycycline. -check CBC and BMP in the morning -incisions are healing nicely. -DVT prophylaxis:  sq heparin -pt needs to be up out  of bed tid at meal times and ambulating in halls   [place], [place] [facility] [phone] 03/10/2021 7:02 AM  I have seen and evaluated the patient. I agree with the PA note as documented above.  States he wants to eat.  We will advance to regular diet.  Negative laparoscopy for bowel ischemia over the weekend and appreciate general surgery. Antibiotics were changed back to p.o. Augmentin and doxycycline yesterday by ID after negative laparoscopy (briefly put on IV vanc and unasyn over the weekend).  Scheduled for VAC change today to the right groin.  Repeat labs pending today.  Cephus Shelling, MD [facility] [place] Office: [phone]

## 2021-03-11 ENCOUNTER — Inpatient Hospital Stay (HOSPITAL_COMMUNITY): Payer: Medicare Other

## 2021-03-11 LAB — BASIC METABOLIC PANEL
Anion gap: 10 (ref 5–15)
BUN: 6 mg/dL — ABNORMAL LOW (ref 8–23)
CO2: 24 mmol/L (ref 22–32)
Calcium: 8 mg/dL — ABNORMAL LOW (ref 8.9–10.3)
Chloride: 102 mmol/L (ref 98–111)
Creatinine, Ser: 0.88 mg/dL (ref 0.61–1.24)
GFR, Estimated: >60 mL/min (ref >60)
Glucose, Bld: 85 mg/dL (ref 70–99)
Potassium: 2.7 mmol/L — CL (ref 3.5–5.1)
Sodium: 136 mmol/L (ref 135–145)

## 2021-03-11 LAB — CBC
HCT: 29.9 % — ABNORMAL LOW (ref 39.0–52.0)
Hemoglobin: 10.1 g/dL — ABNORMAL LOW (ref 13.0–17.0)
MCH: 34.1 pg — ABNORMAL HIGH (ref 26.0–34.0)
MCHC: 33.8 g/dL (ref 30.0–36.0)
MCV: 101 fL — ABNORMAL HIGH (ref 80.0–100.0)
Platelets: 399 10*3/uL (ref 150–400)
RBC: 2.96 MIL/uL — ABNORMAL LOW (ref 4.22–5.81)
RDW: 14.6 % (ref 11.5–15.5)
WBC: 8.9 10*3/uL (ref 4.0–10.5)
nRBC: 0 % (ref 0.0–0.2)

## 2021-03-11 MED ORDER — POTASSIUM CHLORIDE CRYS ER 20 MEQ PO TBCR
40.0000 meq | EXTENDED_RELEASE_TABLET | Freq: Two times a day (BID) | ORAL | Status: AC
  Administered 2021-03-11 (×2): 40 meq via ORAL
  Filled 2021-03-11 (×2): qty 2

## 2021-03-11 MED ORDER — MORPHINE SULFATE (PF) 2 MG/ML IV SOLN
2.0000 mg | INTRAVENOUS | Status: DC | PRN
  Administered 2021-03-11 – 2021-03-14 (×14): 2 mg via INTRAVENOUS
  Filled 2021-03-11 (×14): qty 1

## 2021-03-11 NOTE — Discharge Summary (Incomplete)
Discharge Summary    Wayne [name] February 03, 1956 66 y.o. 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 male history of aortobifemoral bypass for claudication.  He has chronically occluded SFAs.  I have seen him in the past for concern of itching and fluid collection in the right groin but there was no evidence of any issues at that time.  He now states for about a year he has had drainage that has been pus and blood from the right groin.  Today he developed numbness in the right leg this morning.  He stated that he could not feel his foot he could not walk because of this.  Subsequently presented to [facility] emergency department.  He was started on heparin.  He now states that he can feel his foot fine.  He has not had any drainage from the right groin today.  He denies any fevers or chills.  He is on aspirin he does not take blood thinners.  Currently on oxygen with some shortness of breath on arrival today but does not wear oxygen at home.  Hospital Course:  The patient was admitted to the hospital and taken to the operating room and underwent 1.  Reexploration right groin greater than 30 days 2.  Repair of right common femoral pseudoaneurysm with 8 mm Dacron interposition graft from right limb of aortobifemoral bypass to profunda branches 3.  Ligation of right common femoral artery 4.  Sartorial muscle flap coverage of both right femoral graft 5.  Negative pressure dressing placement right groin wound  Findings: There were 2 areas of large pseudoaneurysmal degeneration.  The graft had completely dehisced from the common femoral artery.  The only area of outflow in  his leg was into his common femoral artery cephalad into the external iliac artery.  I was able to isolate 2 profunda branches and perform endarterectomy of these to establish backbleeding.  The right limb of the aortobifemoral bypass graft was trimmed back to healthy appearing graft and interposition graft was placed from the limb down to the 2 profunda branches which were spatulated together.  At completion there was good signal in both profunda branches.  Given that I had excised the 2 pseudoaneurysms there was no skin coverage for the graft will send with muscle was isolated and flapped over the graft.  The wound was partially closed with staples and a negative pressure dressing was placed on top of the muscle flap.  ***  He was taken to the operating room on 02/10/2021 and underwent: Diagnostic laparoscopy by Dr. Magnus Ivan for possible ischemic bowel.    Findings: Patient had no ischemia of the small bowel.  There was some dilatation of the small bowel however no significant signs for an obstruction.  Patient had minimal amount of midline adhesions.  There was some omental adhesions the superior portion of the midline.    The pt tolerated the procedure well and was transported to the PACU in good condition.   By the next day, pt was feeling much better with little to no abdominal or back pain.  ***   CBC    Component Value Date/Time   WBC 8.9 03/11/2021 0320   RBC 2.96 (L) 03/11/2021 0320  HGB 10.1 (L) 03/11/2021 0320   HCT 29.9 (L) 03/11/2021 0320   PLT 399 03/11/2021 0320   MCV 101.0 (H) 03/11/2021 0320   MCH 34.1 (H) 03/11/2021 0320   MCHC 33.8 03/11/2021 0320   RDW 14.6 03/11/2021 0320   LYMPHSABS 1.2 02/08/2021 1647   MONOABS 0.8 02/13/2021 1647   EOSABS 0.0 02/11/2021 1647   BASOSABS 0.1 03/05/2021 1647    BMET    Component Value Date/Time   NA 136 03/11/2021 0320   K 2.7 (LL) 03/11/2021 0320   CL 102 03/11/2021 0320   CO2 24 03/11/2021 0320   GLUCOSE 85 03/11/2021  0320   BUN 6 (L) 03/11/2021 0320   CREATININE 0.88 03/11/2021 0320   CALCIUM 8.0 (L) 03/11/2021 0320   GFRNONAA >60 03/11/2021 0320   GFRAA >60 06/21/2017 0305      Discharge Instructions     Diet - low sodium heart healthy   Complete by: As directed    Increase activity slowly   Complete by: As directed        Discharge Diagnosis:  Arterial occlusion [I70.90] Right leg pain [M79.604] Right leg numbness [R20.0] Right groin pain [R10.31] Limb ischemia [I99.8] 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] Ischemia of extremity [I99.8]  Secondary Diagnosis: Patient Active Problem List   Diagnosis Date Noted   Ischemia of extremity 02/28/2021   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   Elbow joint effusion, left 09/04/2020   Hepatic cirrhosis (HCC) 06/20/2017   Ileus  06/19/2017   Abdominal pain, acute, left lower quadrant 06/12/2017   Respiratory failure with hypoxia (HCC) 06/12/2017   Acute left lower quadrant pain 06/12/2017   Constipation 06/12/2017   PAH (pulmonary artery hypertension) (HCC)    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   Malnutrition of moderate degree 05/12/2017   COPD with acute exacerbation (HCC) 05/11/2017   Fever 05/11/2017   ETOH abuse 05/11/2017   Anemia    COPD (chronic obstructive pulmonary disease) (HCC)    Hypertension    Critical lower limb ischemia (HCC) 04/01/2017   Peripheral vascular disease (HCC) 03/22/2017   Simple chronic bronchitis (HCC) 03/22/2017   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 12/11/2016   Degenerative disc disease, lumbar 12/11/2016   Primary osteoarthritis of right hip 12/11/2016   Hypokalemia 01/19/2016   Chronic systolic heart failure (HCC) 01/18/2016   Acute respiratory failure with hypoxia (HCC) 01/18/2016   Bullous lesion 01/18/2016   Drinks beer 01/18/2016   Hyperlipidemia 01/18/2016   Hyponatremia 01/18/2016   Tobacco abuse 01/18/2016   Past Medical History:  Diagnosis Date   Abnormal pulmonary finding     a. preadmission note indicates pt followed by Dr. Bethanie Dicker for CHF, lung mass, COPD, pulmonary mycosis with plan for 3 mo f/u with CT by 03/2017 note.   Alcohol abuse    Anemia    Arthritis    "right hip; lower back" (05/11/2017)   Chronic systolic CHF (congestive heart failure) (HCC)    a. EF 20-25% by echo 2017. b. 40-45% by echo in 01/2017.   COPD (chronic obstructive pulmonary disease) (HCC)    Dyspnea    GERD (gastroesophageal reflux disease)    History of blood transfusion 2004   "related to bleeding stomach ulcerts"   History of stomach ulcers 2004   Hypercholesterolemia    Hypertension    Malnutrition (HCC)    Moderate mitral regurgitation    Myocardial infarction [facility]) 2004   "think it was bleeding ulcers; not a heart attack" (05/11/2017), pt  denies heart cath   Peripheral vascular disease (HCC)    Pre-diabetes    Pulmonary hypertension (HCC)    a. severe pulm HTN by echo 01/2017.   Tricuspid regurgitation 2017     Allergies as of 03/11/2021   No Active Allergies   Med Rec must be completed prior to using this [facility]***        Durable Medical Equipment  (From admission, onward)           Start     Ordered   02/28/21 0215  For home use only DME Negative pressure wound device  Once       Question Answer Comment  Frequency of dressing change 3 times per week   Length of need 3 Months   Dressing type Foam   Amount of suction 125 mm/Hg   Pressure application Continuous pressure   Supplies 10 canisters and 15 dressings per month for duration of therapy      02/28/21 0214            Prescriptions given:  Roxicet #20 No Refill  Augmentin 875-125mg  bid #60 six refills  Doxycycline 100mg  bid #60 six refills  Instructions: 1.  No driving until returning to see MD in the office and while taking pain medication 2.  No heavy lifting x 4 weeks 3.  Wash with soap and water daily.  Wound vac to be changed M/W/F  Disposition: home with  [facility]  Patient's condition: is Good  Follow up: 1. Dr. [facility] in *** weeks   Randie Heinz, PA-C [facility] [phone] 03/11/2021  8:48 AM

## 2021-03-11 NOTE — Progress Notes (Addendum)
Progress Note    03/11/2021 7:20 AM 3 Days Post-Op  Subjective:  pt states he had abdominal pain and back pain last night and this morning.  Says ne feels like if he can get home and back to his medications and home regimen, it will get better.  He had a BM yesterday.  Some nausea with pain.    Afebrile HR 60's-80's NSR [id number] systolic [id number]  Vitals:   03/11/21 0027 03/11/21 0428  BP: (!) 122/58 (!) 92/55  Pulse: 86 72  Resp: 18 20  Temp: (!) 97.5 F (36.4 C) 98.2 F (36.8 C)  SpO2: 92% 96%    Physical Exam: General:  no distress Lungs:  non labored Incisions:  right groin with wound vac in place with good seal Extremities:  bilateral feet warm and well perfused.  Abdomen:  soft, NT to palpation  CBC    Component Value Date/Time   WBC 8.9 03/11/2021 0320   RBC 2.96 (L) 03/11/2021 0320   HGB 10.1 (L) 03/11/2021 0320   HCT 29.9 (L) 03/11/2021 0320   PLT 399 03/11/2021 0320   MCV 101.0 (H) 03/11/2021 0320   MCH 34.1 (H) 03/11/2021 0320   MCHC 33.8 03/11/2021 0320   RDW 14.6 03/11/2021 0320   LYMPHSABS 1.2 02/16/2021 1647   MONOABS 0.8 02/11/2021 1647   EOSABS 0.0 03/02/2021 1647   BASOSABS 0.1 03/03/2021 1647    BMET    Component Value Date/Time   NA 136 03/11/2021 0320   K 2.7 (LL) 03/11/2021 0320   CL 102 03/11/2021 0320   CO2 24 03/11/2021 0320   GLUCOSE 85 03/11/2021 0320   BUN 6 (L) 03/11/2021 0320   CREATININE 0.88 03/11/2021 0320   CALCIUM 8.0 (L) 03/11/2021 0320   GFRNONAA >60 03/11/2021 0320   GFRAA >60 06/21/2017 0305    INR    Component Value Date/Time   INR 1.1 02/09/2021 1647     Intake/Output Summary (Last 24 hours) at 03/11/2021 0720 Last data filed at 03/11/2021 0431 Gross per 24 hour  Intake 1272.65 ml  Output 1650 ml  Net -377.35 ml     Assessment/Plan:  66 y.o. male is s/p:  1.  Reexploration right groin greater than 30 days 2.  Repair of right common femoral pseudoaneurysm with 8 mm Dacron interposition graft  from right limb of aortobifemoral bypass to profunda branches 3.  Ligation of right common femoral artery 4.  Sartorial muscle flap coverage of both right femoral graft 5.  Negative pressure dressing placement right groin wound  12 Days Post Op And Diagnostic Laparoscopy by Dr. Rosendo Gros  3 Days Post-Op   -still with abdominal pain and back pain but having BM's and leukocytosis is resolved today.  He does not have any rebound tenderness and abdomen is not tender to palpation.  Diagnostic laparoscopy 3 days ago revealed normal bowel and no evidence of ischemia.   -needs to mobilize out of bed and walk.  Insistent on going home today but he is still requiring IV Morphine.  HH will be available tomorrow to start vac changes.   He will need to be off IV pain medication prior to dc. -hypokalemia with K+ of 2.7-will supplement today -DVT prophylaxis:  sq heparin -ID recommending Augmentin and Doxycycline x 6 months and then re-evaluate.  He does have f/u with Dr. Linus Salmons later this month.  -Dr. Stanford Breed to see later this morning.    Leontine Locket, PA-C [facility] [phone] 03/11/2021 7:20 AM  VASCULAR STAFF ADDENDUM: I have independently interviewed and examined the patient. I agree with the above.  No clear source for persistent pain.  OK for home once K+ corrected and off IV pain medicine. Leukocytosis has resolved. ID recommends PO Abx x 6 months.  Yevonne Aline. Stanford Breed, MD [facility] Phone Number: [phone] 03/11/2021 1:20 PM

## 2021-03-11 NOTE — Progress Notes (Signed)
Pt is yelling out for stomach pain, MD notified, pain medication ordered and given with no relief. Dr. Durwin Nora came at the bedside and assess the patient.

## 2021-03-11 NOTE — Care Management Important Message (Signed)
Important Message  Patient Details  Name: Wayne [name] MRN: [id number] Date of Birth: 10/01/1955   Medicare Important Message Given:  Yes     Renie Ora 03/11/2021, 11:30 AM

## 2021-03-11 NOTE — Progress Notes (Signed)
Mobility Specialist: Progress Note   03/11/21 1140  Mobility  Activity Refused mobility   Pt refused mobility stating he isn't going to walk today, RN present in the room.   [facility] Adeyemi Hamad Mobility Specialist Mobility Specialist 4 [place]: [phone] Mobility Specialist 2 [place] and 6 [place]: [phone]

## 2021-03-11 NOTE — Progress Notes (Addendum)
Pt educated per PA request on IV pain medication use and not being able to go home, I personally reached out to our mobility specialist to walk with the patient, pt refused and says "I will move tomorrow", I then educated him on O2 use and coming off of it, pt refused because he is in pain and his breathing is not good. I have medicated the patient for his pain. I also turned the oxygen down to 0, leaving the cannula in his nose so we can monitor sats. After turning it off patients O2 dropped to 84, put him back on 1L and now 91.     Chrisandra Carota, RN 03/11/2021 11:42 AM

## 2021-03-11 NOTE — Progress Notes (Signed)
Pt experienced nonstop pain today with oral pain med's, hunched over in bed, calling out.   Chrisandra Carota, RN 03/11/2021 6:39 PM

## 2021-03-11 NOTE — Progress Notes (Signed)
° °  VASCULAR SURGERY ASSESSMENT & PLAN:   ABDOMINAL PAIN: Called because of persistent abdominal pain.  This patient has a complicated history.  He had presented with an occlusion of the right limb of his aortofemoral bypass graft.  On 02/28/2021 he underwent repair of a right common femoral pseudoaneurysm with an 8 mm interposition Dacron graft from the right limb of his aortofemoral bypass graft to the deep femoral artery.  He also had a sartorius muscle flap.   The patient was having chest pain and abdominal pain on 02/14/2021 and underwent a CT of the chest abdomen pelvis.  This showed an edematous segment of bowel in the terminal ileum and proximal cecum that had not been present compared to a study 5 days prior to this.  This was concerning for ischemic bowel.  On that same day, he underwent diagnostic laparoscopy by Dr. Derrell Lolling.  There was no evidence of bowel ischemia.  There was some dilation of the small bowel but no significant signs of obstruction.  The patient developed recurrent abdominal pain last night and also complains of some back pain.  This pain has persisted throughout the day.  I think his persistent symptoms warrant a repeat CT scan of the abdomen pelvis which I have ordered.   SUBJECTIVE:   The patient was resting comfortably when I arrived initially at the bedside.  Once he was awoken he was complaining of abdominal pain and back pain.  He is a very poor historian and was difficult to get a good feel for the location of his pain.   PHYSICAL EXAM:   Vitals:   03/11/21 0846 03/11/21 0855 03/11/21 1114 03/11/21 1651  BP: 116/65  131/64 123/72  Pulse: 75 87 89 100  Resp: 19 (!) 21 18 18   Temp: 98.1 F (36.7 C)  (!) 97.4 F (36.3 C) 98.9 F (37.2 C)  TempSrc: Oral  Oral Oral  SpO2: 100% 98% 94% 98%  Weight:      Height:       On exam his abdomen is mildly tender.  There is no guarding.  LABS:   Lab Results  Component Value Date   WBC 8.9 03/11/2021   HGB  10.1 (L) 03/11/2021   HCT 29.9 (L) 03/11/2021   MCV 101.0 (H) 03/11/2021   PLT 399 03/11/2021   Lab Results  Component Value Date   CREATININE 0.88 03/11/2021   Lab Results  Component Value Date   INR 1.1 03/07/2021    PROBLEM LIST:    Principal Problem:   Femoral artery aneurysm (HCC) Active Problems:   Ischemia of extremity   CURRENT MEDS:    amoxicillin-clavulanate  1 tablet Oral Q12H   aspirin EC  81 mg Oral Daily   docusate sodium  200 mg Oral Daily   doxycycline  100 mg Oral Q12H   famotidine  20 mg Oral BID AC   fluticasone furoate-vilanterol  1 puff Inhalation Daily   And   umeclidinium bromide  1 puff Inhalation Daily   furosemide  20 mg Oral Daily   heparin  5,000 Units Subcutaneous Q8H   magnesium hydroxide  30 mL Oral Daily   multivitamin with minerals  1 tablet Oral Daily   nicotine  7 mg Transdermal Daily   pantoprazole  40 mg Oral Daily   rosuvastatin  40 mg Oral Daily    03/01/2021 Office: [phone] 03/11/2021

## 2021-03-11 NOTE — Progress Notes (Signed)
Noted pt still on IV pain meds and not medically ready for transition home today, have updated [facility], and will follow up with them for start of care needs when pt medically stable for discharge. Latricia Heft will tentatively plan for Friday 03/30/2021

## 2021-03-12 DIAGNOSIS — R112 Nausea with vomiting, unspecified: Secondary | ICD-10-CM

## 2021-03-12 DIAGNOSIS — Z9889 Other specified postprocedural states: Secondary | ICD-10-CM

## 2021-03-12 DIAGNOSIS — R109 Unspecified abdominal pain: Secondary | ICD-10-CM

## 2021-03-12 LAB — BASIC METABOLIC PANEL
Anion gap: 11 (ref 5–15)
BUN: 6 mg/dL — ABNORMAL LOW (ref 8–23)
CO2: 24 mmol/L (ref 22–32)
Calcium: 8.5 mg/dL — ABNORMAL LOW (ref 8.9–10.3)
Chloride: 102 mmol/L (ref 98–111)
Creatinine, Ser: 1.02 mg/dL (ref 0.61–1.24)
GFR, Estimated: >60 mL/min (ref >60)
Glucose, Bld: 97 mg/dL (ref 70–99)
Potassium: 3.5 mmol/L (ref 3.5–5.1)
Sodium: 137 mmol/L (ref 135–145)

## 2021-03-12 LAB — LACTIC ACID, PLASMA: Lactic Acid, Venous: 1.1 mmol/L (ref 0.5–1.9)

## 2021-03-12 LAB — CBC
HCT: 32.3 % — ABNORMAL LOW (ref 39.0–52.0)
Hemoglobin: 11.1 g/dL — ABNORMAL LOW (ref 13.0–17.0)
MCH: 34.3 pg — ABNORMAL HIGH (ref 26.0–34.0)
MCHC: 34.4 g/dL (ref 30.0–36.0)
MCV: 99.7 fL (ref 80.0–100.0)
Platelets: 453 10*3/uL — ABNORMAL HIGH (ref 150–400)
RBC: 3.24 MIL/uL — ABNORMAL LOW (ref 4.22–5.81)
RDW: 14.6 % (ref 11.5–15.5)
WBC: 12 10*3/uL — ABNORMAL HIGH (ref 4.0–10.5)
nRBC: 0 % (ref 0.0–0.2)

## 2021-03-12 NOTE — Progress Notes (Addendum)
Mobility Specialist: Progress Note   03/12/21 1214  Mobility  Activity Ambulated in hall  Level of Assistance Contact guard assist, steadying assist  Assistive Device Front wheel walker  Distance Ambulated (ft) 80 ft  Mobility Ambulated with assistance in hallway  Mobility Response Tolerated well  Mobility performed by Mobility specialist  $Mobility charge 1 Mobility   Pre-Mobility: >90% SpO2 During Mobility: 128 HR, >90% SpO2 Post-Mobility: 86 HR, 90% SpO2  Pt ambulated on RA and sats maintaining >90% throughout. After returning to the room, pt desat to 86%, quickly recovered. Pt back in bed with call bell at his side. Some bleeding noted at R groin, RN notified.   [facility] Coda Mathey Mobility Specialist Mobility Specialist 4 [place]: [phone] Mobility Specialist 2 [place] and 6 [place]: [phone]

## 2021-03-12 NOTE — Consult Note (Addendum)
WOC Nurse wound follow up Vac dressing removed this AM by Doreatha Massed, PA. W/D dressing placed, waiting for MD to come and assess. If vac needs to be restarted, will return to start.  Please re-consult if needed.   Renaldo Reel Katrinka Blazing, MSN, RN, CMSRN, Angus Seller, [facility] Wound Treatment Associate Pager [phone]

## 2021-03-12 NOTE — Progress Notes (Addendum)
Progress Note    03/12/2021 7:23 AM 4 Days Post-Op  Subjective:  still c/o abdominal and back pain.  Says it is in the right and middle of the upper part of his abdomen and back.  He says he had this pain prior to admission and had it under control with a medication of Pepcid combined with a laxative.  He says this pain is worse since his surgery.   He had a BM yesterday-does not know if there was blood present.    RN states that if she takes the O2 off of him, he drops to the mid 80's.  He did not desat walking with mobility a few days ago.   Afebrile HR 70's-90's NSR 100's-120's systolic 97% RA (on O2 this am)  Vitals:   03/11/21 2341 03/12/21 0406  BP: (!) 121/59 (!) 104/54  Pulse: 85 82  Resp: 14 14  Temp:    SpO2: 96% 97%    Physical Exam: Cardiac:  regular Lungs:  non labored Incisions:  right groin with some venous bleeding from the lateral aspect of the wound.  The wound overall looks healthy.    Extremities:  monophasic doppler signals right AT/PT/peroneal.  Bilateral feet are warm and well perfused.   Abdomen:  soft; negative for rebound tenderness; +BM yesterday  CBC    Component Value Date/Time   WBC 12.0 (H) 03/12/2021 0502   RBC 3.24 (L) 03/12/2021 0502   HGB 11.1 (L) 03/12/2021 0502   HCT 32.3 (L) 03/12/2021 0502   PLT 453 (H) 03/12/2021 0502   MCV 99.7 03/12/2021 0502   MCH 34.3 (H) 03/12/2021 0502   MCHC 34.4 03/12/2021 0502   RDW 14.6 03/12/2021 0502   LYMPHSABS 1.2 02/18/2021 1647   MONOABS 0.8 02/13/2021 1647   EOSABS 0.0 02/19/2021 1647   BASOSABS 0.1 02/16/2021 1647    BMET    Component Value Date/Time   NA 137 03/12/2021 0502   K 3.5 03/12/2021 0502   CL 102 03/12/2021 0502   CO2 24 03/12/2021 0502   GLUCOSE 97 03/12/2021 0502   BUN 6 (L) 03/12/2021 0502   CREATININE 1.02 03/12/2021 0502   CALCIUM 8.5 (L) 03/12/2021 0502   GFRNONAA >60 03/12/2021 0502   GFRAA >60 06/21/2017 0305    INR    Component Value Date/Time   INR 1.1  02/14/2021 1647     Intake/Output Summary (Last 24 hours) at 03/12/2021 0723 Last data filed at 03/12/2021 0548 Gross per 24 hour  Intake 2109.89 ml  Output 1225 ml  Net 884.89 ml     Assessment/Plan:  66 y.o. male is s/p:  1.  Reexploration right groin greater than 30 days 2.  Repair of right common femoral pseudoaneurysm with 8 mm Dacron interposition graft from right limb of aortobifemoral bypass to profunda branches 3.  Ligation of right common femoral artery 4.  Sartorial muscle flap coverage of both right femoral graft 5.  Negative pressure dressing placement right groin wound  13 Days Post Op And Diagnostic Laparoscopy by Dr. Derrell Lolling   4 Days Post-Op   -pt's wound looks clean.  There is some venous ooze from the mid lateral aspect of the wound.  He appears to have fullness laterally and distal to the wound that I have not appreciated before today.  A wet to dry dressing was placed until it can be inspected by MD.  Hgb actually improving -leukocytosis-WBC starting to creep up again and is 12k today.   CT scan obtained  last evening revealed extensive mesenteric edema that has progressed since previous scan.  There is no free intraperitoneal gas.  He does have multiple mildly dilated fluid filled loops of bowel seen throughout.  Will re-consult general surgery given change in pt status.  I have made pt npo.  He has only had a sip of water this morning.  -hypokalemia improved with supplementation yesterday to 3.5 -DVT prophylaxis:  SQ heparin   Doreatha Massed, PA-C [facility] [phone] 03/12/2021 7:23 AM  I have independently interviewed and examine patient and agree with PA assessment and plan above.  He does not appear to have any bleeding from his right groin on exam this afternoon.  Certainly if there is any further concern he will require operative exploration.  His abdomen remains tender and distended and general surgery evaluation much appreciated as  lactic acid appears to be reassuring.  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  Continue to monitor closely all the labs and vital signs are currently stable.  Crystall Donaldson C. Randie Heinz, MD [facility] [place] Office: [phone] Pager: [phone]

## 2021-03-12 NOTE — Progress Notes (Signed)
Patient ID: Wayne [name], male   DOB: 08-31-1955, 66 y.o.   MRN: [id number] [facility] Surgery Progress Note  4 Days Post-Op  Subjective: CC-  Patient underwent repair of right aortobifemoral graft pseudoaneurysm 02/11/2021. Our team was called 12/31 when a postop CT scan was concerning for possible ischemic bowel. He was taken to the OR for diagnostic laparoscopy where he was found to have Normal-appearing bowel. Diet was advanced and we signed off. Since that time his WBC was trending down (19.4>>18.5>>8.9) and pain apparently improving. Last night he complained of worsening pain and noncontrast CT scan was obtained reporting Progressive mesenteric edema, Marked bowel wall thickening, however, previously noted within the right lower quadrant of the abdomen appears to of resolved. WBC 12 today, VSS. Today the patient states his pain is better than last night. States "that's what it does." He will having worsening pain each night then it improves. Pain is mostly in his upper abdomen and radiates into his back. He reports some nausea, no emesis. Passing flatus. Last BM 2 days ago. Tolerated a diet yesterday, he is NPO today.  Objective: Vital signs in last 24 hours: Temp:  [97.3 F (36.3 C)-98.9 F (37.2 C)] 97.6 F (36.4 C) (01/04 0751) Pulse Rate:  [77-100] 88 (01/04 0900) Resp:  [14-19] 19 (01/04 0900) BP: (104-131)/(54-72) 124/61 (01/04 0751) SpO2:  [94 %-98 %] 96 % (01/04 0751) Last BM Date: 03/09/21  Intake/Output from previous day: 01/03 0701 - 01/04 0700 In: 2109.9 [P.O.:218; I.V.:1891.9] Out: 1225 [Urine:1200; Drains:25] Intake/Output this shift: No intake/output data recorded.  PE: Gen:  Alert, NAD Card:  RRR Pulm:  rate and effort normal Abd: soft, nondistended, mild upper abdominal TTP in the left and epigastrium without rebound or guarding, lap incisions clean/ LLQ incision with some serous drainage  Lab Results:  Recent Labs    03/11/21 0320 03/12/21 0502   WBC 8.9 12.0*  HGB 10.1* 11.1*  HCT 29.9* 32.3*  PLT 399 453*   BMET Recent Labs    03/11/21 0320 03/12/21 0502  NA 136 137  K 2.7* 3.5  CL 102 102  CO2 24 24  GLUCOSE 85 97  BUN 6* 6*  CREATININE 0.88 1.02  CALCIUM 8.0* 8.5*   PT/INR No results for input(s): LABPROT, INR in the last 72 hours. CMP     Component Value Date/Time   NA 137 03/12/2021 0502   K 3.5 03/12/2021 0502   CL 102 03/12/2021 0502   CO2 24 03/12/2021 0502   GLUCOSE 97 03/12/2021 0502   BUN 6 (L) 03/12/2021 0502   CREATININE 1.02 03/12/2021 0502   CALCIUM 8.5 (L) 03/12/2021 0502   PROT 7.4 02/20/2021 1647   ALBUMIN 2.8 (L) 03/02/2021 0101   AST 28 02/24/2021 1647   ALT 18 02/19/2021 1647   ALKPHOS 74 02/14/2021 1647   BILITOT 0.6 02/09/2021 1647   GFRNONAA >60 03/12/2021 0502   GFRAA >60 06/21/2017 0305   Lipase     Component Value Date/Time   LIPASE 24 06/11/2017 1502       Studies/Results: CT ABDOMEN PELVIS WO CONTRAST  Result Date: 03/11/2021 CLINICAL DATA:  Status post aortobifemoral bypass grafting now with postoperative abdominal pain EXAM: CT ABDOMEN AND PELVIS WITHOUT CONTRAST TECHNIQUE: Multidetector CT imaging of the abdomen and pelvis was performed following the standard protocol without IV contrast. COMPARISON:  02/24/2021 FINDINGS: Lower chest: Visualized lung bases are clear bilaterally. The cardiac size is within normal limits. Stable trace pericardial effusion. Moderate coronary artery  calcification. Hepatobiliary: No focal liver abnormality is seen. No gallstones, gallbladder wall thickening, or biliary dilatation. Pancreas: Cystic lesion within the head of the pancreas is unchanged from multiple prior examinations, though not well characterized on this exam. The pancreas is otherwise unremarkable. Spleen: Unremarkable Adrenals/Urinary Tract: The adrenal glands are unremarkable. The kidneys are normal in size and position. Minimal left hydronephrosis and proximal hydroureter has  developed at the level of the aortobifemoral bypass graft. The kidneys are otherwise unremarkable. Bladder is unremarkable. Stomach/Bowel: Extensive mesenteric edema is again identified and appears progressive since prior examination. Marked bowel wall thickening, however, previously noted within the right lower quadrant of the abdomen appears to of resolved. Multiple mildly dilated fluid-filled loops of bowel are seen throughout the abdomen without discrete point of transition suggesting a mild ileus. There is no free intraperitoneal gas. Mild ascites is stable. Vascular/Lymphatic: Extensive atherosclerotic calcification within the native aortoiliac vasculature. Aortobifemoral bypass grafting has been performed. Asymmetric soft tissue swelling and subcutaneous hematoma is seen within the right inguinal region with a wound VAC appliance in place, similar to prior examination. Patency of the graft is not well assessed on this noncontrast examination. No pathologic adenopathy within the abdomen and pelvis. Reproductive: Prostate is unremarkable. Other: No abdominal wall hernia. Musculoskeletal: No acute bone abnormality. No lytic or blastic bone lesion. IMPRESSION: Progressive mesenteric edema. Stable ascites. Mesenteric vascular patency is not well assessed on this noncontrast examination. Status post aortobifemoral bypass grafting. Again, noncontrast technique precludes evaluation of patency. Stable soft tissue swelling and subcutaneous hematoma with applied wound VAC within the right inguinal region. Moderate coronary artery calcification. Stable pericardial effusion. Minimal left hydronephrosis, possibly related to mass effect upon the ureter adjacent to the left limb of the aortobifemoral bypass graft. Aortic Atherosclerosis (ICD10-I70.0). Electronically Signed   By: Fidela Salisbury M.D.   On: 03/11/2021 22:14    Anti-infectives: Anti-infectives (From admission, onward)    Start     Dose/Rate Route Frequency  Ordered Stop   03/09/21 1430  doxycycline (VIBRA-TABS) tablet 100 mg        100 mg Oral Every 12 hours 03/09/21 1342     03/09/21 1430  amoxicillin-clavulanate (AUGMENTIN) 875-125 MG per tablet 1 tablet        1 tablet Oral Every 12 hours 03/09/21 1342     02/19/2021 1700  vancomycin (VANCOREADY) IVPB 750 mg/150 mL  Status:  Discontinued        750 mg 150 mL/hr over 60 Minutes Intravenous Every 24 hours 03/07/2021 1423 03/09/21 1342   02/26/2021 1600  Ampicillin-Sulbactam (UNASYN) 3 g in sodium chloride 0.9 % 100 mL IVPB  Status:  Discontinued        3 g 200 mL/hr over 30 Minutes Intravenous Every 6 hours 02/08/2021 1423 03/09/21 1342   03/07/21 1200  doxycycline (VIBRA-TABS) tablet 100 mg  Status:  Discontinued        100 mg Oral Every 12 hours 03/07/21 1109 02/16/2021 1407   03/07/21 1200  amoxicillin-clavulanate (AUGMENTIN) 875-125 MG per tablet 1 tablet  Status:  Discontinued        1 tablet Oral Every 12 hours 03/07/21 1109 03/03/2021 1407   03/06/21 2122  vancomycin (VANCOCIN) IVPB 1000 mg/200 mL premix  Status:  Discontinued        1,000 mg 200 mL/hr over 60 Minutes Intravenous Every 24 hours 03/06/21 0519 03/07/21 1109   03/06/21 1330  amoxicillin (AMOXIL) capsule 500 mg        500 mg Oral  Once 03/06/21 1238 03/06/21 1436   03/01/21 1000  vancomycin (VANCOCIN) IVPB 1000 mg/200 mL premix  Status:  Discontinued        1,000 mg 200 mL/hr over 60 Minutes Intravenous Every 36 hours 02/28/21 0326 03/06/21 0519   02/28/21 1600  ceFEPIme (MAXIPIME) 2 g in sodium chloride 0.9 % 100 mL IVPB  Status:  Discontinued        2 g 200 mL/hr over 30 Minutes Intravenous Every 12 hours 02/28/21 0326 03/07/21 1109   02/28/21 0415  ceFEPIme (MAXIPIME) 2 g in sodium chloride 0.9 % 100 mL IVPB        2 g 200 mL/hr over 30 Minutes Intravenous  Once 02/28/21 0315 02/28/21 0530   02/28/2021 2232  vancomycin (VANCOCIN) 1-5 GM/200ML-% IVPB       Note to Pharmacy: Maude Leriche D: cabinet override      03/01/2021 2232  02/28/21 0319        Assessment/Plan S/p repair of right aortobifemoral graft pseudoaneurysm 02/26/2021 Dr. Donzetta Matters S/p diagnostic laparoscopy 02/13/2021 Dr. Rosendo Gros - negative for ischemic bowel - Noncontrast CT scan from last night is difficult to discern but reports progressive mesenteric edema, marked bowel wall thickening previously noted within the right lower quadrant of the abdomen appears to of resolved. WBC did trend up today from yesterday, but prior to this has been elevated 18-19. His vital signs are stable. Will check lactic acid. Keep NPO for now.  ID - currently augmentin/ doxycycline 1/1>> FEN - NPO, IVF VTE - sq heparin Foley - none  HTN HLD GERD COPD CHF Pulmonary HTN Hx alcohol abuse Hx PUD   LOS: 13 days    Wellington [place], [facility] 03/12/2021, 9:13 AM Please see Amion for pager number during day hours 7:00am-4:30pm

## 2021-03-13 DIAGNOSIS — K9189 Other postprocedural complications and disorders of digestive system: Secondary | ICD-10-CM

## 2021-03-13 DIAGNOSIS — R1084 Generalized abdominal pain: Secondary | ICD-10-CM

## 2021-03-13 DIAGNOSIS — I724 Aneurysm of artery of lower extremity: Secondary | ICD-10-CM | POA: Diagnosis not present

## 2021-03-13 DIAGNOSIS — K5901 Slow transit constipation: Secondary | ICD-10-CM

## 2021-03-13 DIAGNOSIS — R140 Abdominal distension (gaseous): Secondary | ICD-10-CM | POA: Diagnosis not present

## 2021-03-13 DIAGNOSIS — K551 Chronic vascular disorders of intestine: Secondary | ICD-10-CM

## 2021-03-13 DIAGNOSIS — R935 Abnormal findings on diagnostic imaging of other abdominal regions, including retroperitoneum: Secondary | ICD-10-CM

## 2021-03-13 LAB — CBC
HCT: 34.5 % — ABNORMAL LOW (ref 39.0–52.0)
Hemoglobin: 11.4 g/dL — ABNORMAL LOW (ref 13.0–17.0)
MCH: 33.4 pg (ref 26.0–34.0)
MCHC: 33 g/dL (ref 30.0–36.0)
MCV: 101.2 fL — ABNORMAL HIGH (ref 80.0–100.0)
Platelets: 523 10*3/uL — ABNORMAL HIGH (ref 150–400)
RBC: 3.41 MIL/uL — ABNORMAL LOW (ref 4.22–5.81)
RDW: 15 % (ref 11.5–15.5)
WBC: 12.1 10*3/uL — ABNORMAL HIGH (ref 4.0–10.5)
nRBC: 0 % (ref 0.0–0.2)

## 2021-03-13 LAB — COMPREHENSIVE METABOLIC PANEL
ALT: 21 U/L (ref 0–44)
AST: 24 U/L (ref 15–41)
Albumin: 2.8 g/dL — ABNORMAL LOW (ref 3.5–5.0)
Alkaline Phosphatase: 57 U/L (ref 38–126)
Anion gap: 11 (ref 5–15)
BUN: 8 mg/dL (ref 8–23)
CO2: 23 mmol/L (ref 22–32)
Calcium: 8.7 mg/dL — ABNORMAL LOW (ref 8.9–10.3)
Chloride: 103 mmol/L (ref 98–111)
Creatinine, Ser: 1.08 mg/dL (ref 0.61–1.24)
GFR, Estimated: >60 mL/min (ref >60)
Glucose, Bld: 131 mg/dL — ABNORMAL HIGH (ref 70–99)
Potassium: 3.3 mmol/L — ABNORMAL LOW (ref 3.5–5.1)
Sodium: 137 mmol/L (ref 135–145)
Total Bilirubin: 1 mg/dL (ref 0.3–1.2)
Total Protein: 6.3 g/dL — ABNORMAL LOW (ref 6.5–8.1)

## 2021-03-13 MED ORDER — PANTOPRAZOLE SODIUM 40 MG PO TBEC
40.0000 mg | DELAYED_RELEASE_TABLET | Freq: Two times a day (BID) | ORAL | Status: DC
  Filled 2021-03-13: qty 1

## 2021-03-13 MED ORDER — SODIUM CHLORIDE 0.9 % IV SOLN: INTRAVENOUS | Status: DC

## 2021-03-13 MED ORDER — FLEET ENEMA 7-19 GM/118ML RE ENEM
1.0000 | ENEMA | Freq: Two times a day (BID) | RECTAL | Status: DC
  Administered 2021-03-14: 1 via RECTAL
  Filled 2021-03-13: qty 1

## 2021-03-13 NOTE — Consult Note (Addendum)
Consultation  Referring Provider:   Dr. Donzetta Matters with vascular surgery Primary Care Physician:  Kathreen Devoid, [place] Primary Gastroenterologist: [facility]    (unassigned here)    Reason for Consultation: Abdominal pain            HPI:   Wayne [name] is a 66 y.o. male with a past medical history as listed below including COPD, GERD, MI, pulmonary hypertension and multiple others, who initially presented to the hospital on 02/26/2021 andadmitted by the vascular service.  Had a history of chronically occluded SFAs and was concerned about some pus and blood which had chronically been draining from his right groin for about a year.  He had initially presented in [facility] at the ER for losing feeling in his foot, had been seen and started on heparin and could feel his foot fine and it was recommended he have urgent operative repair of his thrombosed outflow and 2 pseudoaneurysms.  He has since undergone surgery and we are consulted now in regards to a new complaint of abdominal pain which is developed since being admitted.    03/02/2021 repair of right aortobifemoral graft and pseudoaneurysm by Dr. Donzetta Matters.    03/02/2021 abdominal x-ray with nonobstructive bowel gas pattern.    02/21/2021 CT of the angio chest/abdomen/pelvis showed diffusely edematous segment of bowel in the region of the terminal ileum and proximal cecum which was new since the study 5 days prior most concerning for ischemic bowel involving the terminal ileum and proximal cecum.  Increasing size of soft tissue or fluid collection anterior to the right hip concerning for progressive postoperative seroma or abscess.  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  Abdominal free fluid thought reactive.    03/07/2021 diagnostic laparoscopically for question of ischemic bowel which was negative.    03/11/2021 CT the abdomen pelvis without contrast showed progressive mesenteric edema, stable ascites, mesenteric vascular patency was  not well assessed.  Status post aortobifemoral bypass grafting.  Stable soft tissue swelling and subcutaneous hematoma.  Moderate coronary artery calcification and stable pericardial effusion.  Minimal left hydronephrosis, possibly related to mass-effect upon the ureter adjacent to the left limb of the aortobifemoral bypass graft.  There was note the patient had marked bowel wall thickening however previously noted within the right lower quadrant of the abdomen appeared to be resolved.  Multiple mildly dilated fluid-filled loops of bowel were seen throughout the abdomen without discrete point of transition suggesting a mild ileus.  Mild ascites is stable.    03/13/2021 patient seen by general surgery.  They discussed that he continued to complain of pain which seemed out of proportion to exam.  Noted to have improving labs and a normal lactic acid.  Some concern that eating seem to make his pain worse but noted that CTA showed open vascularity so concern for chronic mesenteric ischemia was low.  Thought some distention in his abdomen secondary to ascites noted on recent CT scan with no evidence of ileus.    Today, the patient explains that he has excruciating abdominal pain.  This is worse when he takes a bite of anything and is burning and sharp, this prohibits him from eating much of anything.  Along with this has been experiencing some nausea and vomiting over the past few days.  He tells me that sometimes he is able to take his pills and other times he cannot.  Also describes that he has not had a bowel movement in  3 days which she feels like is making things worse.  Apparently tried some of the "liquids" they gave him to try and make him have a bowel movement but this made his symptoms worse with increased bloating.  He does not want to take any of them anymore.    Does describe history of similar symptoms about a year ago when seeing [facility] medical GI, apparently had an EGD and colonoscopy at the time and  was started on some "pills" which seemed to help all of his symptoms go away.  This was not a problem when he came into the hospital.    Patient's largest complaint at time my interview is of pain in general.  He wants to be put on a pain pump.    Denies fever, chills, blood in his stool or symptoms that awaken him from sleep.  Past Medical History:  Diagnosis Date   Abnormal pulmonary finding    a. preadmission note indicates pt followed by Dr. Gwenevere Ghazi for CHF, lung mass, COPD, pulmonary mycosis with plan for 3 mo f/u with CT by 03/2017 note.   Alcohol abuse    Anemia    Arthritis    "right hip; lower back" (05/11/2017)   Chronic systolic CHF (congestive heart failure) ([place])    a. EF 20-25% by echo 2017. b. 40-45% by echo in 01/2017.   COPD (chronic obstructive pulmonary disease) (HCC)    Dyspnea    GERD (gastroesophageal reflux disease)    History of blood transfusion 2004   "related to bleeding stomach ulcerts"   History of stomach ulcers 2004   Hypercholesterolemia    Hypertension    Malnutrition (HCC)    Moderate mitral regurgitation    Myocardial infarction [facility]) 2004   "think it was bleeding ulcers; not a heart attack" (05/11/2017), pt denies heart cath   Peripheral vascular disease ([place])    Pre-diabetes    Pulmonary hypertension ([place])    a. severe pulm HTN by echo 01/2017.   Tricuspid regurgitation 2017    Past Surgical History:  Procedure Laterality Date   ABDOMINAL AORTOGRAM W/LOWER EXTREMITY N/A 04/01/2017   Procedure: ABDOMINAL AORTOGRAM W/LOWER EXTREMITY;  Surgeon: Conrad Marenisco, MD;  Location: [place] CV [facility];  Service: Cardiovascular;  Laterality: N/A;  Bilateral   AORTA - BILATERAL FEMORAL ARTERY BYPASS GRAFT N/A 06/01/2017   Procedure: AORTA BIFEMORAL BYPASS GRAFT with Graft and Right Femoral Endartarectomy;  Surgeon: Conrad Colquitt, MD;  Location: [place];  Service: Vascular;  Laterality: N/A;   AORTA - BILATERAL FEMORAL ARTERY BYPASS  GRAFT Right 02/21/2021   Procedure: INTERPOSITION AORTA BIFEMORAL TO PROFUNDA BYPASS GRAFT;  Surgeon: Waynetta Sandy, MD;  Location: [place];  Service: Vascular;  Laterality: Right;   APPLICATION OF WOUND VAC Right 02/10/2021   Procedure: APPLICATION OF WOUND VAC;  Surgeon: Waynetta Sandy, MD;  Location: [place];  Service: Vascular;  Laterality: Right;   COLONOSCOPY     FEMORAL-POPLITEAL BYPASS GRAFT Right 02/26/2021   Procedure: Repair of Right Femoral artery possible femoral bypass;  Surgeon: Waynetta Sandy, MD;  Location: [place];  Service: Vascular;  Laterality: Right;   [place] N/A 02/26/2021   Procedure: LAPAROSCOPY DIAGNOSTIC;  Surgeon: Ralene Ok, MD;  Location: [place];  Service: General;  Laterality: N/A;   MASS EXCISION Left 04/13/2017   Procedure: EXCISION EPIDERMAL INCLUSION CYST LEFT GROIN;  Surgeon: Conrad Victor, MD;  Location: [place];  Service:  Vascular;  Laterality: Left;   MUSCLE FLAP CLOSURE Right 02/11/2021   Procedure: MUSCLE FLAP CLOSURE;  Surgeon: Maeola Harman, MD;  Location: [facility] OR;  Service: Vascular;  Laterality: Right;   PERIPHERAL VASCULAR INTERVENTION Left    "groin; for clogged artery"    Family History  Problem Relation Age of Onset   Hypertension Sister    Hyperlipidemia Sister    Diabetes Sister    Peripheral Artery Disease Father     Social History   Tobacco Use   Smoking status: Every Day    Packs/day: 0.25    Years: 50.00    Pack years: 12.50    Types: Cigarettes   Smokeless tobacco: Never  Vaping Use   Vaping Use: Never used  Substance Use Topics   Alcohol use: Yes    Alcohol/week: 8.0 standard drinks    Types: 8 Cans of beer per week    Comment: occ   Drug use: No    Prior to Admission medications   Medication Sig Start Date End Date Taking? Authorizing Provider  acetaminophen (TYLENOL) 500 MG tablet Take 2 tablets (1,000 mg  total) by mouth every 6 (six) hours as needed. Patient taking differently: Take 250 mg by mouth daily as needed for moderate pain or headache. 02/17/16  Yes Roxy Horseman, PA-C  albuterol (PROVENTIL HFA;VENTOLIN HFA) 108 (90 Base) MCG/ACT inhaler Inhale 2 puffs into the lungs every 6 (six) hours as needed for wheezing or shortness of breath.   Yes [provider]  aspirin EC 81 MG tablet Take 81 mg by mouth daily.   Yes [provider]  BREZTRI AEROSPHERE 160-9-4.8 MCG/ACT AERO Inhale 1 puff into the lungs daily. 02/24/21  Yes [provider]  famotidine (PEPCID) 20 MG tablet Take 20 mg by mouth 2 (two) times daily before a meal.   Yes [provider]  furosemide (LASIX) 20 MG tablet Take 1 tablet (20 mg total) by mouth daily. 05/15/17  Yes Mikhail, [place], DO  Multiple Vitamin (MULTIVITAMIN WITH MINERALS) TABS tablet Take 1 tablet by mouth daily.   Yes [provider]  omeprazole (PRILOSEC) 20 MG capsule Take 1 capsule (20 mg total) by mouth daily as needed (for indegestion). 05/14/17  Yes Mikhail, Nita Sells, DO  rosuvastatin (CRESTOR) 40 MG tablet Take 40 mg by mouth daily. 02/17/21  Yes [provider]    Current Facility-Administered Medications  Medication Dose Route Frequency Provider Last Rate Last Admin   0.9 %  sodium chloride infusion  500 mL Intravenous Once PRN Rhyne, Samantha J, PA-C       0.9 %  sodium chloride infusion   Intravenous Continuous Cephus Shelling, MD 75 mL/hr at 03/13/21 0916 New Bag at 03/13/21 0916   acetaminophen (TYLENOL) tablet 325-650 mg  325-650 mg Oral Q4H PRN Dara Lords, PA-C   650 mg at 03/12/21 1623   Or   acetaminophen (TYLENOL) suppository 325-650 mg  325-650 mg Rectal Q4H PRN Rhyne, Samantha J, PA-C       albuterol (PROVENTIL) (2.5 MG/3ML) 0.083% nebulizer solution 2.5 mg  2.5 mg Inhalation Q6H PRN Rhyne, Samantha J, PA-C       alum & mag hydroxide-simeth (MAALOX/MYLANTA) 200-200-20  MG/5ML suspension 15-30 mL  15-30 mL Oral Q2H PRN Rhyne, Samantha J, PA-C   30 mL at 03/12/21 1623   amoxicillin-clavulanate (AUGMENTIN) 875-125 MG per tablet 1 tablet  1 tablet Oral Q12H Gardiner Barefoot, MD   1 tablet at 03/12/21 2040  aspirin EC tablet 81 mg  81 mg Oral Daily Dara Lords, PA-C   81 mg at 03/12/21 0912   bisacodyl (DULCOLAX) suppository 10 mg  10 mg Rectal Daily PRN Dara Lords, PA-C   10 mg at 03/03/21 2048   diphenhydrAMINE (BENADRYL) injection 25 mg  25 mg Intravenous Once PRN Kathlynn Grate, DO       docusate sodium (COLACE) capsule 200 mg  200 mg Oral Daily Baglia, Corrina, PA-C   200 mg at 03/12/21 0911   doxycycline (VIBRA-TABS) tablet 100 mg  100 mg Oral Q12H Gardiner Barefoot, MD   100 mg at 03/12/21 2040   EPINEPHrine (EPI-PEN) injection 0.3 mg  0.3 mg Intramuscular Once PRN Kathlynn Grate, DO       famotidine (PEPCID) tablet 20 mg  20 mg Oral BID AC Maeola Harman, MD   20 mg at 03/12/21 1659   fluticasone furoate-vilanterol (BREO ELLIPTA) 200-25 MCG/ACT 1 puff  1 puff Inhalation Daily Rhyne, Ames Coupe, PA-C   1 puff at 03/13/21 0834   And   umeclidinium bromide (INCRUSE ELLIPTA) 62.5 MCG/ACT 1 puff  1 puff Inhalation Daily Rhyne, Ames Coupe, PA-C   1 puff at 03/13/21 0834   furosemide (LASIX) tablet 20 mg  20 mg Oral Daily Rhyne, Samantha J, PA-C   20 mg at 03/12/21 0912   guaiFENesin-dextromethorphan (ROBITUSSIN DM) 100-10 MG/5ML syrup 15 mL  15 mL Oral Q4H PRN Rhyne, Samantha J, PA-C       heparin injection 5,000 Units  5,000 Units Subcutaneous Q8H Rhyne, Ames Coupe, PA-C   5,000 Units at 03/12/21 2040   hydrALAZINE (APRESOLINE) injection 5 mg  5 mg Intravenous Q20 Min PRN Rhyne, Samantha J, PA-C       labetalol (NORMODYNE) injection 10 mg  10 mg Intravenous Q10 min PRN Rhyne, Samantha J, PA-C       magnesium hydroxide (MILK OF MAGNESIA) suspension 30 mL  30 mL Oral Daily Leonie Douglas, MD   30 mL at 03/13/21 1610    magnesium sulfate IVPB 2 g 50 mL  2 g Intravenous Daily PRN Rhyne, Samantha J, PA-C       metoprolol tartrate (LOPRESSOR) injection 2-5 mg  2-5 mg Intravenous Q2H PRN Rhyne, Samantha J, PA-C       morphine 2 MG/ML injection 2 mg  2 mg Intravenous Q2H PRN Chuck Hint, MD   2 mg at 03/13/21 1119   multivitamin with minerals tablet 1 tablet  1 tablet Oral Daily Rhyne, Ames Coupe, PA-C   1 tablet at 03/12/21 9604   nicotine (NICODERM CQ - dosed in mg/24 hr) patch 7 mg  7 mg Transdermal Daily Maeola Harman, MD   7 mg at 03/13/21 0922   ondansetron (ZOFRAN) injection 4 mg  4 mg Intravenous Q6H PRN Rhyne, Ames Coupe, PA-C   4 mg at 03/13/21 1028   oxyCODONE-acetaminophen (PERCOCET/ROXICET) 5-325 MG per tablet 1-2 tablet  1-2 tablet Oral Q4H PRN Dara Lords, PA-C   2 tablet at 03/12/21 2041   pantoprazole (PROTONIX) EC tablet 40 mg  40 mg Oral Daily Rhyne, Samantha J, PA-C   40 mg at 03/12/21 0911   phenol (CHLORASEPTIC) mouth spray 1 spray  1 spray Mouth/Throat PRN Rhyne, Samantha J, PA-C       polyethylene glycol (MIRALAX / GLYCOLAX) packet 17 g  17 g Oral Daily PRN Rhyne, Samantha J, PA-C   17 g at 03/12/21 1623  rosuvastatin (CRESTOR) tablet 40 mg  40 mg Oral Daily Rhyne, Samantha J, PA-C   40 mg at 03/12/21 [id number]    Allergies as of 02/10/2021 - Review Complete 02/15/2021  Allergen Reaction Noted   Penicillins Itching, Rash, and Other (See Comments) 01/10/2016     Review of Systems:    Constitutional: No weight loss, fever or chills Skin: No rash  Cardiovascular: No chest pain  Respiratory: No SOB  Gastrointestinal: See HPI and otherwise negative Genitourinary: No dysuria Neurological: No headache, dizziness or syncope Musculoskeletal: No new muscle or joint pain Hematologic: No bleeding Psychiatric: No history of depression or anxiety    Physical Exam:  Vital signs in last 24 hours: Temp:  [97.7 F (36.5 C)-98.4 F (36.9 C)] 97.7 F (36.5 C)  (01/05 0914) Pulse Rate:  [91-116] 99 (01/05 0914) Resp:  [17-21] 21 (01/05 0914) BP: (142-157)/(63-89) 157/80 (01/05 0914) SpO2:  [90 %-98 %] 92 % (01/05 0914) Last BM Date: 03/09/21 General:   Pleasant Cachectic AA male appears to be in mild distress, Well developed, alert and cooperative Head:  Normocephalic and atraumatic. Eyes:   PEERL, EOMI. No icterus. Conjunctiva pink. Ears:  Normal auditory acuity. Neck:  Supple Throat: Oral cavity and pharynx without inflammation, swelling or lesion.  Lungs: Respirations even and unlabored. Lungs clear to auscultation bilaterally.   No wheezes, crackles, or rhonchi.  Heart: Normal S1, S2. No MRG. Regular rate and rhythm. No peripheral edema, cyanosis or pallor.  Abdomen:  Soft,  mild distension, Generalized ttp, worse in the epigastrum with involuntary guarding, decreased bowel sounds all four quadrants, No appreciable masses or hepatomegaly. Rectal:  Not performed.  Msk:  Symmetrical without gross deformities. Peripheral pulses intact.  Extremities:  Without edema, no deformity or joint abnormality.  Neurologic:  Alert and  oriented x4;  grossly normal neurologically.  Skin:   Dry and intact without significant lesions or rashes. Psychiatric: Demonstrates good judgement and reason without abnormal affect or behaviors.  LAB RESULTS: Recent Labs    03/11/21 0320 03/12/21 0502 03/13/21 0213  WBC 8.9 12.0* 12.1*  HGB 10.1* 11.1* 11.4*  HCT 29.9* 32.3* 34.5*  PLT 399 453* 523*   BMET Recent Labs    03/11/21 0320 03/12/21 0502  NA 136 137  K 2.7* 3.5  CL 102 102  CO2 24 24  GLUCOSE 85 97  BUN 6* 6*  CREATININE 0.88 1.02  CALCIUM 8.0* 8.5*   STUDIES: CT ABDOMEN PELVIS WO CONTRAST  Result Date: 03/11/2021 CLINICAL DATA:  Status post aortobifemoral bypass grafting now with postoperative abdominal pain EXAM: CT ABDOMEN AND PELVIS WITHOUT CONTRAST TECHNIQUE: Multidetector CT imaging of the abdomen and pelvis was performed following  the standard protocol without IV contrast. COMPARISON:  02/23/2021 FINDINGS: Lower chest: Visualized lung bases are clear bilaterally. The cardiac size is within normal limits. Stable trace pericardial effusion. Moderate coronary artery calcification. Hepatobiliary: No focal liver abnormality is seen. No gallstones, gallbladder wall thickening, or biliary dilatation. Pancreas: Cystic lesion within the head of the pancreas is unchanged from multiple prior examinations, though not well characterized on this exam. The pancreas is otherwise unremarkable. Spleen: Unremarkable Adrenals/Urinary Tract: The adrenal glands are unremarkable. The kidneys are normal in size and position. Minimal left hydronephrosis and proximal hydroureter has developed at the level of the aortobifemoral bypass graft. The kidneys are otherwise unremarkable. Bladder is unremarkable. Stomach/Bowel: Extensive mesenteric edema is again identified and appears progressive since prior examination. Marked bowel wall thickening, however, previously noted within the right  lower quadrant of the abdomen appears to of resolved. Multiple mildly dilated fluid-filled loops of bowel are seen throughout the abdomen without discrete point of transition suggesting a mild ileus. There is no free intraperitoneal gas. Mild ascites is stable. Vascular/Lymphatic: Extensive atherosclerotic calcification within the native aortoiliac vasculature. Aortobifemoral bypass grafting has been performed. Asymmetric soft tissue swelling and subcutaneous hematoma is seen within the right inguinal region with a wound VAC appliance in place, similar to prior examination. Patency of the graft is not well assessed on this noncontrast examination. No pathologic adenopathy within the abdomen and pelvis. Reproductive: Prostate is unremarkable. Other: No abdominal wall hernia. Musculoskeletal: No acute bone abnormality. No lytic or blastic bone lesion. IMPRESSION: Progressive mesenteric  edema. Stable ascites. Mesenteric vascular patency is not well assessed on this noncontrast examination. Status post aortobifemoral bypass grafting. Again, noncontrast technique precludes evaluation of patency. Stable soft tissue swelling and subcutaneous hematoma with applied wound VAC within the right inguinal region. Moderate coronary artery calcification. Stable pericardial effusion. Minimal left hydronephrosis, possibly related to mass effect upon the ureter adjacent to the left limb of the aortobifemoral bypass graft. Aortic Atherosclerosis (ICD10-I70.0). Electronically Signed   By: Fidela Salisbury M.D.   On: 03/11/2021 22:14      Impression / Plan:   Impression: 1.  Abdominal pain: Mostly epigastric, worse over the past 3 days, worse with eating, prior history of what sounds like gastritis; consider gastritis vs PUD vs other 2. GERD:  h/o outpatient of EGD in the past year at [facility], better per patient on "pill they gave him"; consider this contributing to above 3.  Constipation: Recent CT with question of ileus, patient is minimally distended on exam and has not had a bowel movement in 3 days, constipation is chronic for him but acutely worse since surgery and addition of pain meds 3.  Status post diagnostic laparoscopy: 03/04/2021 which was negative for ischemic bowel 4.  Status post repair of right aorto bifemoral graft pseudoaneurysm: 02/15/2021 5.  History of alcohol abuse 6.  Pulmonary hypertension 7.  CHF 8.  COPD  Plan: 1.  Would recommend minimizing pain medications as this will only worsen his ileus and constipation.  (Understanding this may be difficult in this patient who is requesting more pain meds at the moment) 2.  Increased Pantoprazole to 40 mg twice daily, twice daily AC 3.  Ordered 2 fleets enemas for the patient today 4.  Scheduled patient for an EGD tomorrow with Dr. Henrene Pastor.  Did discuss risks, benefits, limitations and alternatives and the patient agrees to  proceed.  Thank you for your kind consultation, we will continue to follow.  Wayne [name]  03/13/2021, 12:01 PM  GI ATTENDING  History, laboratories, x-rays personally reviewed.  Patient personally interviewed, seen, and examined.  Agree with comprehensive consultation note as outlined above.  I personally saw the patient and performed a substantive portion of this encounter, including a complete performance of at least one of the key components (MDM, Hx and/or Exam), in conjunction with the Advanced Practice Provider.  Complicated patient with multiple significant medical problems.  Appears chronically ill.  We are asked to see for abdominal pain in the setting of recent aortobifemoral graft pseudoaneurysm repair 10 days ago.  Given the timing of the pain and initial CT, it is quite likely he did have some sort of vascular insult to his bowel.  However, the bowel appeared normal on laparoscopy.  He complains of pain, asked for pain meds.  A  bit distended.  Difficult stool in the colon.  Mild ileus.  Suspect these may be the current reasons for abdominal discomfort.  His risk for ulcer disease.  Now on PPI.  Recommendations as follows: 1.  Fleets enemas for constipation.  He refuses p.o. laxatives 2.  Minimize eliminate pain medications as this would only exacerbate constipation and ileus 3.  Increase PPI to twice daily 4.  Diagnostic upper endoscopy tomorrow 5.  General surgery continue to follow  Docia Chuck. Geri Seminole., M.D. [facility] Division of Gastroenterology

## 2021-03-13 NOTE — Progress Notes (Addendum)
°  Progress Note    03/13/2021 9:21 AM 5 Days Post-Op  Subjective:  C/o pain.  Says there are too many pills to take. Says he is nauseated and vomited yesterday.   Afebrile HR 80's-100's NSR 140's-150's systolic 92% 2LO2NC   Vitals:   03/13/21 0834 03/13/21 0914  BP:  (!) 157/80  Pulse:  99  Resp:  (!) 21  Temp:  97.7 F (36.5 C)  SpO2: 90% 92%    Physical Exam: General:  appears uncomfortable Lungs:  non labored Incisions:  right groin dressing changed.  Wound is clean. No active bleeding. Extremities:  bilateral feet are warm and well perfused. Abdomen:  distended tender to palpation  CBC    Component Value Date/Time   WBC 12.1 (H) 03/13/2021 0213   RBC 3.41 (L) 03/13/2021 0213   HGB 11.4 (L) 03/13/2021 0213   HCT 34.5 (L) 03/13/2021 0213   PLT 523 (H) 03/13/2021 0213   MCV 101.2 (H) 03/13/2021 0213   MCH 33.4 03/13/2021 0213   MCHC 33.0 03/13/2021 0213   RDW 15.0 03/13/2021 0213   LYMPHSABS 1.2 2021-03-12 1647   MONOABS 0.8 03-12-21 1647   EOSABS 0.0 2021-03-12 1647   BASOSABS 0.1 03/12/2021 1647    BMET    Component Value Date/Time   NA 137 03/12/2021 0502   K 3.5 03/12/2021 0502   CL 102 03/12/2021 0502   CO2 24 03/12/2021 0502   GLUCOSE 97 03/12/2021 0502   BUN 6 (L) 03/12/2021 0502   CREATININE 1.02 03/12/2021 0502   CALCIUM 8.5 (L) 03/12/2021 0502   GFRNONAA >60 03/12/2021 0502   GFRAA >60 06/21/2017 0305    INR    Component Value Date/Time   INR 1.1 03/12/21 1647     Intake/Output Summary (Last 24 hours) at 03/13/2021 0921 Last data filed at 03/13/2021 0500 Gross per 24 hour  Intake 414.36 ml  Output 550 ml  Net -135.64 ml     Assessment/Plan:  66 y.o. male is s/p:  1.  Reexploration right groin greater than 30 days 2.  Repair of right common femoral pseudoaneurysm with 8 mm Dacron interposition graft from right limb of aortobifemoral bypass to profunda branches 3.  Ligation of right common femoral artery 4.  Sartorial muscle  flap coverage of both right femoral graft 5.  Negative pressure dressing placement right groin wound  14 Days Post Op And Diagnostic Laparoscopy by Dr. Derrell Lolling   5 Days Post-Op   -pt's wounds clean.  Leukocytosis stable today at 12k.  Appreciate general surgery recommendations.  Continues to have abdominal pain.  Continue Protonix and Pepcid.  May benefit from GI consult for continued pain.  Lactic acid normal yesterday with recent CTA which demonstrates widely patent celiac and SMA and chronically occluded hypogastric arteries bilaterally and inferior mesenteric artery.  -DVT prophylaxis:  sq heparin -Dr. Randie Heinz to see pt this morning    Doreatha Massed, PA-C [facility] [phone] 03/13/2021 9:21 AM  I have independently interviewed and examined patient and agree with PA assessment and plan above. Persistent abdominal pain. Appreciate general surgery assistance, will call GI to eval. Right groin has minimal venous oozing today but does not appear to be frankly bleeding. Will need nutrition soon.   Lilly Gasser C. Randie Heinz, MD [facility] [place] Office: [phone] Pager: [phone]

## 2021-03-13 NOTE — Progress Notes (Signed)
Pt has refused all PO meds today, with the exception of scheduled milk of magnesia. Abd pain 10/10 all day. PRN morphine and zofran given per pt request. MD aware.

## 2021-03-13 NOTE — Progress Notes (Signed)
Patient ID: Wayne [name], male   DOB: 1956/02/02, 66 y.o.   MRN: [id number] [facility] Surgery Progress Note  5 Days Post-Op  Subjective: Continues to c/o pain, which seems out of proportion as he is laying in bed in NAD.  RN reports she had to wake him up for pain meds yesterday when he would ask for more.  Says he isn't eating. Says he vomited, but RN was unaware of this and says it wasn't reported to her.   Objective: Vital signs in last 24 hours: Temp:  [97.7 F (36.5 C)-98.4 F (36.9 C)] 97.7 F (36.5 C) (01/05 0914) Pulse Rate:  [80-116] 99 (01/05 0914) Resp:  [15-21] 21 (01/05 0914) BP: (126-157)/(61-89) 157/80 (01/05 0914) SpO2:  [90 %-98 %] 92 % (01/05 0914) Last BM Date: 03/09/21  Intake/Output from previous day: 01/04 0701 - 01/05 0700 In: 414.4 [I.V.:414.4] Out: 550 [Urine:550] Intake/Output this shift: No intake/output data recorded.  PE: Gen:  Alert, NAD Abd: soft, mild distention, mild upper abdominal TTP in the left and epigastrium without rebound or guarding, lap incisions clean/ LLQ incision with some serous drainage  Lab Results:  Recent Labs    03/12/21 0502 03/13/21 0213  WBC 12.0* 12.1*  HGB 11.1* 11.4*  HCT 32.3* 34.5*  PLT 453* 523*   BMET Recent Labs    03/11/21 0320 03/12/21 0502  NA 136 137  K 2.7* 3.5  CL 102 102  CO2 24 24  GLUCOSE 85 97  BUN 6* 6*  CREATININE 0.88 1.02  CALCIUM 8.0* 8.5*   PT/INR No results for input(s): LABPROT, INR in the last 72 hours. CMP     Component Value Date/Time   NA 137 03/12/2021 0502   K 3.5 03/12/2021 0502   CL 102 03/12/2021 0502   CO2 24 03/12/2021 0502   GLUCOSE 97 03/12/2021 0502   BUN 6 (L) 03/12/2021 0502   CREATININE 1.02 03/12/2021 0502   CALCIUM 8.5 (L) 03/12/2021 0502   PROT 7.4 02/11/2021 1647   ALBUMIN 2.8 (L) 03/02/2021 0101   AST 28 02/08/2021 1647   ALT 18 02/16/2021 1647   ALKPHOS 74 02/18/2021 1647   BILITOT 0.6 03/05/2021 1647   GFRNONAA >60 03/12/2021 0502    GFRAA >60 06/21/2017 0305   Lipase     Component Value Date/Time   LIPASE 24 06/11/2017 1502       Studies/Results: CT ABDOMEN PELVIS WO CONTRAST  Result Date: 03/11/2021 CLINICAL DATA:  Status post aortobifemoral bypass grafting now with postoperative abdominal pain EXAM: CT ABDOMEN AND PELVIS WITHOUT CONTRAST TECHNIQUE: Multidetector CT imaging of the abdomen and pelvis was performed following the standard protocol without IV contrast. COMPARISON:  03/05/2021 FINDINGS: Lower chest: Visualized lung bases are clear bilaterally. The cardiac size is within normal limits. Stable trace pericardial effusion. Moderate coronary artery calcification. Hepatobiliary: No focal liver abnormality is seen. No gallstones, gallbladder wall thickening, or biliary dilatation. Pancreas: Cystic lesion within the head of the pancreas is unchanged from multiple prior examinations, though not well characterized on this exam. The pancreas is otherwise unremarkable. Spleen: Unremarkable Adrenals/Urinary Tract: The adrenal glands are unremarkable. The kidneys are normal in size and position. Minimal left hydronephrosis and proximal hydroureter has developed at the level of the aortobifemoral bypass graft. The kidneys are otherwise unremarkable. Bladder is unremarkable. Stomach/Bowel: Extensive mesenteric edema is again identified and appears progressive since prior examination. Marked bowel wall thickening, however, previously noted within the right lower quadrant of the abdomen appears to of  resolved. Multiple mildly dilated fluid-filled loops of bowel are seen throughout the abdomen without discrete point of transition suggesting a mild ileus. There is no free intraperitoneal gas. Mild ascites is stable. Vascular/Lymphatic: Extensive atherosclerotic calcification within the native aortoiliac vasculature. Aortobifemoral bypass grafting has been performed. Asymmetric soft tissue swelling and subcutaneous hematoma is seen  within the right inguinal region with a wound VAC appliance in place, similar to prior examination. Patency of the graft is not well assessed on this noncontrast examination. No pathologic adenopathy within the abdomen and pelvis. Reproductive: Prostate is unremarkable. Other: No abdominal wall hernia. Musculoskeletal: No acute bone abnormality. No lytic or blastic bone lesion. IMPRESSION: Progressive mesenteric edema. Stable ascites. Mesenteric vascular patency is not well assessed on this noncontrast examination. Status post aortobifemoral bypass grafting. Again, noncontrast technique precludes evaluation of patency. Stable soft tissue swelling and subcutaneous hematoma with applied wound VAC within the right inguinal region. Moderate coronary artery calcification. Stable pericardial effusion. Minimal left hydronephrosis, possibly related to mass effect upon the ureter adjacent to the left limb of the aortobifemoral bypass graft. Aortic Atherosclerosis (ICD10-I70.0). Electronically Signed   By: Fidela Salisbury M.D.   On: 03/11/2021 22:14    Anti-infectives: Anti-infectives (From admission, onward)    Start     Dose/Rate Route Frequency Ordered Stop   03/09/21 1430  doxycycline (VIBRA-TABS) tablet 100 mg        100 mg Oral Every 12 hours 03/09/21 1342     03/09/21 1430  amoxicillin-clavulanate (AUGMENTIN) 875-125 MG per tablet 1 tablet        1 tablet Oral Every 12 hours 03/09/21 1342     02/19/2021 1700  vancomycin (VANCOREADY) IVPB 750 mg/150 mL  Status:  Discontinued        750 mg 150 mL/hr over 60 Minutes Intravenous Every 24 hours 02/21/2021 1423 03/09/21 1342   03/07/2021 1600  Ampicillin-Sulbactam (UNASYN) 3 g in sodium chloride 0.9 % 100 mL IVPB  Status:  Discontinued        3 g 200 mL/hr over 30 Minutes Intravenous Every 6 hours 02/11/2021 1423 03/09/21 1342   03/07/21 1200  doxycycline (VIBRA-TABS) tablet 100 mg  Status:  Discontinued        100 mg Oral Every 12 hours 03/07/21 1109 02/09/2021 1407    03/07/21 1200  amoxicillin-clavulanate (AUGMENTIN) 875-125 MG per tablet 1 tablet  Status:  Discontinued        1 tablet Oral Every 12 hours 03/07/21 1109 02/21/2021 1407   03/06/21 2122  vancomycin (VANCOCIN) IVPB 1000 mg/200 mL premix  Status:  Discontinued        1,000 mg 200 mL/hr over 60 Minutes Intravenous Every 24 hours 03/06/21 0519 03/07/21 1109   03/06/21 1330  amoxicillin (AMOXIL) capsule 500 mg        500 mg Oral  Once 03/06/21 1238 03/06/21 1436   03/01/21 1000  vancomycin (VANCOCIN) IVPB 1000 mg/200 mL premix  Status:  Discontinued        1,000 mg 200 mL/hr over 60 Minutes Intravenous Every 36 hours 02/28/21 0326 03/06/21 0519   02/28/21 1600  ceFEPIme (MAXIPIME) 2 g in sodium chloride 0.9 % 100 mL IVPB  Status:  Discontinued        2 g 200 mL/hr over 30 Minutes Intravenous Every 12 hours 02/28/21 0326 03/07/21 1109   02/28/21 0415  ceFEPIme (MAXIPIME) 2 g in sodium chloride 0.9 % 100 mL IVPB        2 g 200 mL/hr  over 30 Minutes Intravenous  Once 02/28/21 0315 02/28/21 0530   02/26/2021 2232  vancomycin (VANCOCIN) 1-5 GM/200ML-% IVPB       Note to Pharmacy: Maude Leriche D: cabinet override      02/22/2021 2232 02/28/21 0319        Assessment/Plan S/p repair of right aortobifemoral graft pseudoaneurysm 02/20/2021 Dr. Donzetta Matters POD 5, S/p diagnostic laparoscopy 02/16/2021 Dr. Rosendo Gros - negative for ischemic bowel - labs are stable to improving.  Lactic acid is normal with an overall improved CT scan on 1/3.  His vitals are stable -pain seems out of proportion to physical exam.  He states eating seems be what makes his pain worse, but CTA showed open vascularity so concern for chronic mesenteric ischemia is low -suspect some distention is secondary to ascites noted on CT scan.  Not much evidence of ileus. -on PPI therapy -may need GI evaluation at some point to rule out PUD, etc as etiology.    ID - currently augmentin/ doxycycline 1/1>> FEN - regular diet VTE - sq heparin Foley  - none  HTN HLD GERD COPD CHF Pulmonary HTN Hx alcohol abuse Hx PUD   LOS: 14 days    [name], [facility] Surgery 03/13/2021, 9:54 AM Please see Amion for pager number during day hours 7:00am-4:30pm

## 2021-03-14 ENCOUNTER — Encounter (HOSPITAL_COMMUNITY): Payer: Self-pay | Admitting: Vascular Surgery

## 2021-03-14 ENCOUNTER — Inpatient Hospital Stay (HOSPITAL_COMMUNITY): Payer: Medicare Other

## 2021-03-14 ENCOUNTER — Inpatient Hospital Stay (HOSPITAL_COMMUNITY): Payer: Medicare Other | Admitting: Certified Registered Nurse Anesthetist

## 2021-03-14 ENCOUNTER — Encounter (HOSPITAL_COMMUNITY): Admission: EM | Disposition: E | Payer: Self-pay | Source: Home / Self Care | Attending: Vascular Surgery

## 2021-03-14 DIAGNOSIS — E43 Unspecified severe protein-calorie malnutrition: Secondary | ICD-10-CM | POA: Insufficient documentation

## 2021-03-14 HISTORY — PX: APPLICATION OF WOUND VAC: SHX5189

## 2021-03-14 HISTORY — PX: LAPAROTOMY: SHX154

## 2021-03-14 LAB — POCT I-STAT 7, (LYTES, BLD GAS, ICA,H+H)
Acid-Base Excess: 0 mmol/L (ref 0.0–2.0)
Acid-base deficit: 7 mmol/L — ABNORMAL HIGH (ref 0.0–2.0)
Acid-base deficit: 4 mmol/L — ABNORMAL HIGH (ref 0.0–2.0)
Acid-base deficit: 2 mmol/L (ref 0.0–2.0)
Acid-base deficit: 2 mmol/L (ref 0.0–2.0)
Bicarbonate: 19.6 mmol/L — ABNORMAL LOW (ref 20.0–28.0)
Bicarbonate: 23.4 mmol/L (ref 20.0–28.0)
Bicarbonate: 23.6 mmol/L (ref 20.0–28.0)
Bicarbonate: 24.3 mmol/L (ref 20.0–28.0)
Bicarbonate: 24.7 mmol/L (ref 20.0–28.0)
Calcium, Ion: 1.12 mmol/L — ABNORMAL LOW (ref 1.15–1.40)
Calcium, Ion: 1.15 mmol/L (ref 1.15–1.40)
Calcium, Ion: 1.19 mmol/L (ref 1.15–1.40)
Calcium, Ion: 1.26 mmol/L (ref 1.15–1.40)
Calcium, Ion: 1.26 mmol/L (ref 1.15–1.40)
HCT: 17 % — ABNORMAL LOW (ref 39.0–52.0)
HCT: 42 % (ref 39.0–52.0)
HCT: 48 % (ref 39.0–52.0)
HCT: 50 % (ref 39.0–52.0)
HCT: 50 % (ref 39.0–52.0)
Hemoglobin: 14.3 g/dL (ref 13.0–17.0)
Hemoglobin: 16.3 g/dL (ref 13.0–17.0)
Hemoglobin: 17 g/dL (ref 13.0–17.0)
Hemoglobin: 17 g/dL (ref 13.0–17.0)
Hemoglobin: 5.8 g/dL — CL (ref 13.0–17.0)
O2 Saturation: 100 %
O2 Saturation: 89 %
O2 Saturation: 93 %
O2 Saturation: 98 %
O2 Saturation: 98 %
Patient temperature: 35.4
Patient temperature: 36.8
Patient temperature: 37.1
Patient temperature: 96.1
Potassium: 2.8 mmol/L — ABNORMAL LOW (ref 3.5–5.1)
Potassium: 3.6 mmol/L (ref 3.5–5.1)
Potassium: 3.7 mmol/L (ref 3.5–5.1)
Potassium: 3.7 mmol/L (ref 3.5–5.1)
Potassium: 3.8 mmol/L (ref 3.5–5.1)
Sodium: 140 mmol/L (ref 135–145)
Sodium: 142 mmol/L (ref 135–145)
Sodium: 144 mmol/L (ref 135–145)
Sodium: 145 mmol/L (ref 135–145)
Sodium: 146 mmol/L — ABNORMAL HIGH (ref 135–145)
TCO2: 21 mmol/L — ABNORMAL LOW (ref 22–32)
TCO2: 25 mmol/L (ref 22–32)
TCO2: 25 mmol/L (ref 22–32)
TCO2: 26 mmol/L (ref 22–32)
TCO2: 26 mmol/L (ref 22–32)
pCO2 arterial: 35.4 mmHg (ref 32.0–48.0)
pCO2 arterial: 38.8 mmHg (ref 32.0–48.0)
pCO2 arterial: 40.6 mmHg (ref 32.0–48.0)
pCO2 arterial: 49.7 mmHg — ABNORMAL HIGH (ref 32.0–48.0)
pCO2 arterial: 53.9 mmHg — ABNORMAL HIGH (ref 32.0–48.0)
pH, Arterial: 7.254 — ABNORMAL LOW (ref 7.350–7.450)
pH, Arterial: 7.283 — ABNORMAL LOW (ref 7.350–7.450)
pH, Arterial: 7.305 — ABNORMAL LOW (ref 7.350–7.450)
pH, Arterial: 7.386 (ref 7.350–7.450)
pH, Arterial: 7.432 (ref 7.350–7.450)
pO2, Arterial: 111 mmHg — ABNORMAL HIGH (ref 83.0–108.0)
pO2, Arterial: 112 mmHg — ABNORMAL HIGH (ref 83.0–108.0)
pO2, Arterial: 240 mmHg — ABNORMAL HIGH (ref 83.0–108.0)
pO2, Arterial: 62 mmHg — ABNORMAL LOW (ref 83.0–108.0)
pO2, Arterial: 72 mmHg — ABNORMAL LOW (ref 83.0–108.0)

## 2021-03-14 LAB — CBC
HCT: 45.1 % (ref 39.0–52.0)
HCT: 49.1 % (ref 39.0–52.0)
Hemoglobin: 15.2 g/dL (ref 13.0–17.0)
Hemoglobin: 17.6 g/dL — ABNORMAL HIGH (ref 13.0–17.0)
MCH: 30.1 pg (ref 26.0–34.0)
MCH: 30.6 pg (ref 26.0–34.0)
MCHC: 33.7 g/dL (ref 30.0–36.0)
MCHC: 35.8 g/dL (ref 30.0–36.0)
MCV: 89.3 fL (ref 80.0–100.0)
MCV: 85.2 fL (ref 80.0–100.0)
Platelets: 199 10*3/uL (ref 150–400)
Platelets: 229 10*3/uL (ref 150–400)
RBC: 5.05 MIL/uL (ref 4.22–5.81)
RBC: 5.76 MIL/uL (ref 4.22–5.81)
RDW: 18.5 % — ABNORMAL HIGH (ref 11.5–15.5)
RDW: 20.6 % — ABNORMAL HIGH (ref 11.5–15.5)
WBC: 36.3 10*3/uL — ABNORMAL HIGH (ref 4.0–10.5)
WBC: 35.8 10*3/uL — ABNORMAL HIGH (ref 4.0–10.5)
nRBC: 0.1 % (ref 0.0–0.2)
nRBC: 0.2 % (ref 0.0–0.2)

## 2021-03-14 LAB — CBC WITH DIFFERENTIAL/PLATELET
Abs Immature Granulocytes: 0 10*3/uL (ref 0.00–0.07)
Basophils Absolute: 0 10*3/uL (ref 0.0–0.1)
Basophils Relative: 0 %
Eosinophils Absolute: 0 10*3/uL (ref 0.0–0.5)
Eosinophils Relative: 0 %
HCT: 29.1 % — ABNORMAL LOW (ref 39.0–52.0)
Hemoglobin: 9.8 g/dL — ABNORMAL LOW (ref 13.0–17.0)
Lymphocytes Relative: 3 %
Lymphs Abs: 1 10*3/uL (ref 0.7–4.0)
MCH: 34.5 pg — ABNORMAL HIGH (ref 26.0–34.0)
MCHC: 33.7 g/dL (ref 30.0–36.0)
MCV: 102.5 fL — ABNORMAL HIGH (ref 80.0–100.0)
Monocytes Absolute: 1.7 10*3/uL — ABNORMAL HIGH (ref 0.1–1.0)
Monocytes Relative: 5 %
Neutro Abs: 30.7 10*3/uL — ABNORMAL HIGH (ref 1.7–7.7)
Neutrophils Relative %: 92 %
Platelets: 511 10*3/uL — ABNORMAL HIGH (ref 150–400)
RBC: 2.84 MIL/uL — ABNORMAL LOW (ref 4.22–5.81)
RDW: 15.7 % — ABNORMAL HIGH (ref 11.5–15.5)
WBC: 33.4 10*3/uL — ABNORMAL HIGH (ref 4.0–10.5)
nRBC: 0 /100 WBC
nRBC: 0.1 % (ref 0.0–0.2)

## 2021-03-14 LAB — BASIC METABOLIC PANEL
Anion gap: 12 (ref 5–15)
Anion gap: 9 (ref 5–15)
BUN: 13 mg/dL (ref 8–23)
BUN: 11 mg/dL (ref 8–23)
CO2: 22 mmol/L (ref 22–32)
CO2: 24 mmol/L (ref 22–32)
Calcium: 8.8 mg/dL — ABNORMAL LOW (ref 8.9–10.3)
Calcium: 8.4 mg/dL — ABNORMAL LOW (ref 8.9–10.3)
Chloride: 105 mmol/L (ref 98–111)
Chloride: 110 mmol/L (ref 98–111)
Creatinine, Ser: 1.25 mg/dL — ABNORMAL HIGH (ref 0.61–1.24)
Creatinine, Ser: 0.89 mg/dL (ref 0.61–1.24)
GFR, Estimated: >60 mL/min (ref >60)
GFR, Estimated: >60 mL/min (ref >60)
Glucose, Bld: 149 mg/dL — ABNORMAL HIGH (ref 70–99)
Glucose, Bld: 163 mg/dL — ABNORMAL HIGH (ref 70–99)
Potassium: 3.4 mmol/L — ABNORMAL LOW (ref 3.5–5.1)
Potassium: 3.8 mmol/L (ref 3.5–5.1)
Sodium: 139 mmol/L (ref 135–145)
Sodium: 143 mmol/L (ref 135–145)

## 2021-03-14 LAB — BLOOD GAS, ARTERIAL
Acid-base deficit: 2.1 mmol/L — ABNORMAL HIGH (ref 0.0–2.0)
Bicarbonate: 22.6 mmol/L (ref 20.0–28.0)
FIO2: 28
O2 Saturation: 76 %
Patient temperature: 36.3
pCO2 arterial: 40 mmHg (ref 32.0–48.0)
pH, Arterial: 7.367 (ref 7.350–7.450)
pO2, Arterial: 43.3 mmHg — ABNORMAL LOW (ref 83.0–108.0)

## 2021-03-14 LAB — PREPARE RBC (CROSSMATCH)

## 2021-03-14 LAB — LACTIC ACID, PLASMA
Lactic Acid, Venous: 3.5 mmol/L (ref 0.5–1.9)
Lactic Acid, Venous: 5.4 mmol/L (ref 0.5–1.9)
Lactic Acid, Venous: 2.8 mmol/L (ref 0.5–1.9)

## 2021-03-14 LAB — GLUCOSE, CAPILLARY
Glucose-Capillary: 132 mg/dL — ABNORMAL HIGH (ref 70–99)
Glucose-Capillary: 140 mg/dL — ABNORMAL HIGH (ref 70–99)

## 2021-03-14 LAB — MRSA NEXT GEN BY PCR, NASAL: MRSA by PCR Next Gen: NOT DETECTED

## 2021-03-14 LAB — MAGNESIUM: Magnesium: 2.8 mg/dL — ABNORMAL HIGH (ref 1.7–2.4)

## 2021-03-14 SURGERY — ESOPHAGOGASTRODUODENOSCOPY (EGD) WITH PROPOFOL
Anesthesia: Monitor Anesthesia Care

## 2021-03-14 SURGERY — LAPAROTOMY, EXPLORATORY
Anesthesia: General

## 2021-03-14 MED ORDER — DOCUSATE SODIUM 50 MG/5ML PO LIQD: 100.0000 mg | Freq: Two times a day (BID) | ORAL | Status: DC

## 2021-03-14 MED ORDER — NOREPINEPHRINE 4 MG/250ML-% IV SOLN
0.0000 ug/min | INTRAVENOUS | Status: DC
  Administered 2021-03-14: 17 ug/min via INTRAVENOUS
  Administered 2021-03-14: 25 ug/min via INTRAVENOUS
  Administered 2021-03-15: 18 ug/min via INTRAVENOUS
  Administered 2021-03-15: 24 ug/min via INTRAVENOUS
  Administered 2021-03-15: 26 ug/min via INTRAVENOUS
  Administered 2021-03-15: 8 ug/min via INTRAVENOUS
  Administered 2021-03-15: 36 ug/min via INTRAVENOUS
  Administered 2021-03-16 (×2): 34 ug/min via INTRAVENOUS
  Administered 2021-03-16: 17 ug/min via INTRAVENOUS
  Filled 2021-03-14 (×3): qty 250
  Filled 2021-03-14: qty 500
  Filled 2021-03-14 (×5): qty 250

## 2021-03-14 MED ORDER — ORAL CARE MOUTH RINSE: 15.0000 mL | Freq: Once | OROMUCOSAL | Status: DC

## 2021-03-14 MED ORDER — PIPERACILLIN-TAZOBACTAM 3.375 G IVPB
3.3750 g | Freq: Three times a day (TID) | INTRAVENOUS | Status: DC
  Administered 2021-03-14: 3.375 g via INTRAVENOUS
  Filled 2021-03-14: qty 50

## 2021-03-14 MED ORDER — FENTANYL CITRATE (PF) 100 MCG/2ML IJ SOLN: 100.0000 ug | Freq: Once | INTRAMUSCULAR | Status: AC

## 2021-03-14 MED ORDER — LEVALBUTEROL HCL 0.63 MG/3ML IN NEBU
0.6300 mg | INHALATION_SOLUTION | Freq: Once | RESPIRATORY_TRACT | Status: AC
  Administered 2021-03-14: 0.63 mg via RESPIRATORY_TRACT
  Filled 2021-03-14: qty 3

## 2021-03-14 MED ORDER — LACTATED RINGERS IV SOLN: INTRAVENOUS | Status: DC

## 2021-03-14 MED ORDER — CHLORHEXIDINE GLUCONATE 0.12% ORAL RINSE (MEDLINE KIT)
15.0000 mL | Freq: Two times a day (BID) | OROMUCOSAL | Status: DC
  Administered 2021-03-14 – 2021-03-18 (×8): 15 mL via OROMUCOSAL

## 2021-03-14 MED ORDER — 0.9 % SODIUM CHLORIDE (POUR BTL) OPTIME
TOPICAL | Status: DC | PRN
  Administered 2021-03-14 (×2): 1000 mL

## 2021-03-14 MED ORDER — SODIUM BICARBONATE 8.4 % IV SOLN
INTRAVENOUS | Status: DC | PRN
  Administered 2021-03-14 (×2): 50 meq via INTRAVENOUS

## 2021-03-14 MED ORDER — AMIODARONE HCL IN DEXTROSE 360-4.14 MG/200ML-% IV SOLN
30.0000 mg/h | INTRAVENOUS | Status: DC
  Administered 2021-03-15 – 2021-03-18 (×7): 30 mg/h via INTRAVENOUS
  Filled 2021-03-14 (×7): qty 200

## 2021-03-14 MED ORDER — LACTATED RINGERS IV SOLN
INTRAVENOUS | Status: DC | PRN
Start: 2021-03-14 — End: 2021-03-14

## 2021-03-14 MED ORDER — ALBUMIN HUMAN 5 % IV SOLN
INTRAVENOUS | Status: DC | PRN
Start: 2021-03-14 — End: 2021-03-14

## 2021-03-14 MED ORDER — FENTANYL CITRATE (PF) 250 MCG/5ML IJ SOLN
INTRAMUSCULAR | Status: AC
  Filled 2021-03-14: qty 5

## 2021-03-14 MED ORDER — CHLORHEXIDINE GLUCONATE CLOTH 2 % EX PADS
6.0000 | MEDICATED_PAD | Freq: Every day | CUTANEOUS | Status: DC
  Administered 2021-03-15 – 2021-03-17 (×3): 6 via TOPICAL

## 2021-03-14 MED ORDER — VASOPRESSIN 20 UNIT/ML IV SOLN
INTRAVENOUS | Status: DC | PRN
  Administered 2021-03-14: 1 [IU] via INTRAVENOUS
  Administered 2021-03-14 (×2): 2 [IU] via INTRAVENOUS
  Administered 2021-03-14: 1 [IU] via INTRAVENOUS
  Administered 2021-03-14: 2 [IU] via INTRAVENOUS

## 2021-03-14 MED ORDER — POLYETHYLENE GLYCOL 3350 17 G PO PACK: 17.0000 g | PACK | Freq: Every day | ORAL | Status: DC

## 2021-03-14 MED ORDER — ONDANSETRON HCL 4 MG/2ML IJ SOLN
INTRAMUSCULAR | Status: DC | PRN
  Administered 2021-03-14: 4 mg via INTRAVENOUS

## 2021-03-14 MED ORDER — PANTOPRAZOLE SODIUM 40 MG IV SOLR
40.0000 mg | Freq: Two times a day (BID) | INTRAVENOUS | Status: DC
  Administered 2021-03-14 – 2021-03-18 (×7): 40 mg via INTRAVENOUS
  Filled 2021-03-14 (×7): qty 40

## 2021-03-14 MED ORDER — PROPOFOL 10 MG/ML IV BOLUS
INTRAVENOUS | Status: AC
  Filled 2021-03-14: qty 20

## 2021-03-14 MED ORDER — LACTATED RINGERS IV BOLUS
1000.0000 mL | Freq: Once | INTRAVENOUS | Status: AC
  Administered 2021-03-14: 1000 mL via INTRAVENOUS

## 2021-03-14 MED ORDER — MAGNESIUM HYDROXIDE 400 MG/5ML PO SUSP: 30.0000 mL | Freq: Every day | ORAL | Status: DC

## 2021-03-14 MED ORDER — DOBUTAMINE IN D5W 4-5 MG/ML-% IV SOLN
2.5000 ug/kg/min | INTRAVENOUS | Status: DC
  Administered 2021-03-14: 2.5 ug/kg/min via INTRAVENOUS
  Filled 2021-03-14: qty 250

## 2021-03-14 MED ORDER — NOREPINEPHRINE 4 MG/250ML-% IV SOLN
INTRAVENOUS | Status: AC
  Administered 2021-03-14: 2 ug/min via INTRAVENOUS
  Filled 2021-03-14: qty 250

## 2021-03-14 MED ORDER — LACTATED RINGERS IV BOLUS
500.0000 mL | Freq: Once | INTRAVENOUS | Status: AC
  Administered 2021-03-14: 500 mL via INTRAVENOUS

## 2021-03-14 MED ORDER — DEXAMETHASONE SODIUM PHOSPHATE 10 MG/ML IJ SOLN
INTRAMUSCULAR | Status: DC | PRN
  Administered 2021-03-14: 10 mg via INTRAVENOUS

## 2021-03-14 MED ORDER — ALBUMIN HUMAN 25 % IV SOLN
12.5000 g | Freq: Once | INTRAVENOUS | Status: AC
  Administered 2021-03-14: 12.5 g via INTRAVENOUS
  Filled 2021-03-14: qty 50

## 2021-03-14 MED ORDER — PIPERACILLIN-TAZOBACTAM 3.375 G IVPB
3.3750 g | Freq: Three times a day (TID) | INTRAVENOUS | Status: DC
  Administered 2021-03-14 – 2021-03-18 (×10): 3.375 g via INTRAVENOUS
  Filled 2021-03-14 (×10): qty 50

## 2021-03-14 MED ORDER — OXYCODONE-ACETAMINOPHEN 5-325 MG PO TABS: 1.0000 | ORAL_TABLET | ORAL | Status: DC | PRN

## 2021-03-14 MED ORDER — SODIUM CHLORIDE 0.9% IV SOLUTION: Freq: Once | INTRAVENOUS | Status: DC

## 2021-03-14 MED ORDER — FENTANYL CITRATE (PF) 250 MCG/5ML IJ SOLN
INTRAMUSCULAR | Status: DC | PRN
  Administered 2021-03-14 (×5): 50 ug via INTRAVENOUS

## 2021-03-14 MED ORDER — FENTANYL CITRATE PF 50 MCG/ML IJ SOSY
PREFILLED_SYRINGE | INTRAMUSCULAR | Status: AC
  Administered 2021-03-14: 100 ug
  Filled 2021-03-14: qty 2

## 2021-03-14 MED ORDER — FUROSEMIDE 20 MG PO TABS: 20.0000 mg | ORAL_TABLET | Freq: Every day | ORAL | Status: DC

## 2021-03-14 MED ORDER — LIDOCAINE 2% (20 MG/ML) 5 ML SYRINGE
INTRAMUSCULAR | Status: DC | PRN
  Administered 2021-03-14: 60 mg via INTRAVENOUS

## 2021-03-14 MED ORDER — AMIODARONE HCL IN DEXTROSE 360-4.14 MG/200ML-% IV SOLN
60.0000 mg/h | INTRAVENOUS | Status: AC
  Administered 2021-03-14 (×3): 60 mg/h via INTRAVENOUS
  Filled 2021-03-14 (×2): qty 200

## 2021-03-14 MED ORDER — ORAL CARE MOUTH RINSE
15.0000 mL | OROMUCOSAL | Status: DC
  Administered 2021-03-14 – 2021-03-18 (×38): 15 mL via OROMUCOSAL

## 2021-03-14 MED ORDER — FENTANYL CITRATE (PF) 100 MCG/2ML IJ SOLN: 25.0000 ug | INTRAMUSCULAR | Status: DC | PRN

## 2021-03-14 MED ORDER — FAMOTIDINE IN NACL 20-0.9 MG/50ML-% IV SOLN
20.0000 mg | Freq: Two times a day (BID) | INTRAVENOUS | Status: DC
  Administered 2021-03-14: 20 mg via INTRAVENOUS
  Filled 2021-03-14: qty 50

## 2021-03-14 MED ORDER — MIDAZOLAM HCL 2 MG/2ML IJ SOLN
2.0000 mg | INTRAMUSCULAR | Status: DC | PRN
  Administered 2021-03-15: 2 mg via INTRAVENOUS
  Filled 2021-03-14: qty 2

## 2021-03-14 MED ORDER — DOXYCYCLINE HYCLATE 100 MG PO TABS
100.0000 mg | ORAL_TABLET | Freq: Two times a day (BID) | ORAL | Status: DC
  Filled 2021-03-14 (×4): qty 1

## 2021-03-14 MED ORDER — ROCURONIUM BROMIDE 10 MG/ML (PF) SYRINGE
PREFILLED_SYRINGE | INTRAVENOUS | Status: DC | PRN
Start: 2021-03-14 — End: 2021-03-14
  Administered 2021-03-14 (×2): 50 mg via INTRAVENOUS

## 2021-03-14 MED ORDER — FENTANYL CITRATE PF 50 MCG/ML IJ SOSY: 50.0000 ug | PREFILLED_SYRINGE | Freq: Once | INTRAMUSCULAR | Status: DC

## 2021-03-14 MED ORDER — SODIUM CHLORIDE 0.9 % IV SOLN
3.0000 g | Freq: Four times a day (QID) | INTRAVENOUS | Status: DC
  Administered 2021-03-14: 3 g via INTRAVENOUS
  Filled 2021-03-14 (×4): qty 8

## 2021-03-14 MED ORDER — CALCIUM CHLORIDE 10 % IV SOLN
INTRAVENOUS | Status: DC | PRN
  Administered 2021-03-14: 200 mg via INTRAVENOUS
  Administered 2021-03-14: 300 mg via INTRAVENOUS

## 2021-03-14 MED ORDER — EPINEPHRINE 1 MG/10ML IJ SOSY
PREFILLED_SYRINGE | INTRAMUSCULAR | Status: DC | PRN
  Administered 2021-03-14 (×3): .1 mg via INTRAVENOUS
  Administered 2021-03-14: .2 mg via INTRAVENOUS
  Administered 2021-03-14 (×2): .1 mg via INTRAVENOUS

## 2021-03-14 MED ORDER — SUCCINYLCHOLINE CHLORIDE 200 MG/10ML IV SOSY
PREFILLED_SYRINGE | INTRAVENOUS | Status: DC | PRN
  Administered 2021-03-14: 140 mg via INTRAVENOUS

## 2021-03-14 MED ORDER — STERILE WATER FOR INJECTION IJ SOLN
50.0000 ng/kg/min | INTRAVENOUS | Status: DC
  Administered 2021-03-14 – 2021-03-15 (×2): 50 ng/kg/min via RESPIRATORY_TRACT
  Filled 2021-03-14 (×5): qty 5

## 2021-03-14 MED ORDER — FENTANYL 2500MCG IN NS 250ML (10MCG/ML) PREMIX INFUSION
200.0000 ug/h | INTRAVENOUS | Status: DC
  Administered 2021-03-14: 50 ug/h via INTRAVENOUS
  Administered 2021-03-15: 100 ug/h via INTRAVENOUS
  Administered 2021-03-15 – 2021-03-16 (×2): 150 ug/h via INTRAVENOUS
  Administered 2021-03-17: 175 ug/h via INTRAVENOUS
  Administered 2021-03-17: 150 ug/h via INTRAVENOUS
  Administered 2021-03-18: 175 ug/h via INTRAVENOUS
  Filled 2021-03-14 (×7): qty 250

## 2021-03-14 MED ORDER — PHENYLEPHRINE HCL-NACL 20-0.9 MG/250ML-% IV SOLN
INTRAVENOUS | Status: DC | PRN
Start: 2021-03-14 — End: 2021-03-14
  Administered 2021-03-14: 50 ug/min via INTRAVENOUS

## 2021-03-14 MED ORDER — ALBUMIN HUMAN 5 % IV SOLN
INTRAVENOUS | Status: AC
  Filled 2021-03-14: qty 250

## 2021-03-14 MED ORDER — ALUM & MAG HYDROXIDE-SIMETH 200-200-20 MG/5ML PO SUSP: 15.0000 mL | ORAL | Status: DC | PRN

## 2021-03-14 MED ORDER — SODIUM CHLORIDE 0.9% FLUSH: 10.0000 mL | INTRAVENOUS | Status: DC | PRN

## 2021-03-14 MED ORDER — SODIUM CHLORIDE 0.9% FLUSH
10.0000 mL | Freq: Two times a day (BID) | INTRAVENOUS | Status: DC
  Administered 2021-03-15: 10 mL
  Administered 2021-03-15: 20 mL
  Administered 2021-03-16: 10 mL
  Administered 2021-03-16: 20 mL
  Administered 2021-03-17 (×2): 40 mL
  Administered 2021-03-18: 10 mL

## 2021-03-14 MED ORDER — ETOMIDATE 2 MG/ML IV SOLN
INTRAVENOUS | Status: DC | PRN
Start: 2021-03-14 — End: 2021-03-14
  Administered 2021-03-14: 14 mg via INTRAVENOUS

## 2021-03-14 MED ORDER — CHLORHEXIDINE GLUCONATE 0.12 % MT SOLN: 15.0000 mL | Freq: Once | OROMUCOSAL | Status: DC

## 2021-03-14 MED ORDER — CHLORHEXIDINE GLUCONATE 0.12 % MT SOLN
OROMUCOSAL | Status: AC
  Administered 2021-03-14: 15 mL via OROMUCOSAL
  Filled 2021-03-14: qty 15

## 2021-03-14 MED ORDER — PHENYLEPHRINE HCL (PRESSORS) 10 MG/ML IV SOLN
INTRAVENOUS | Status: DC | PRN
Start: 2021-03-14 — End: 2021-03-14
  Administered 2021-03-14 (×2): 160 ug via INTRAVENOUS

## 2021-03-14 MED ORDER — FENTANYL BOLUS VIA INFUSION
50.0000 ug | INTRAVENOUS | Status: DC | PRN
  Administered 2021-03-14 (×2): 50 ug via INTRAVENOUS
  Administered 2021-03-15: 100 ug via INTRAVENOUS
  Administered 2021-03-15: 50 ug via INTRAVENOUS
  Filled 2021-03-14: qty 100

## 2021-03-14 MED ORDER — MIDAZOLAM HCL 2 MG/2ML IJ SOLN
2.0000 mg | INTRAMUSCULAR | Status: DC | PRN
  Administered 2021-03-15 (×2): 2 mg via INTRAVENOUS
  Filled 2021-03-14 (×4): qty 2

## 2021-03-14 SURGICAL SUPPLY — 39 items
BAG COUNTER SPONGE SURGICOUNT (BAG) ×3 IMPLANT
BLADE CLIPPER SURG (BLADE) IMPLANT
CANISTER SUCT 3000ML PPV (MISCELLANEOUS) ×3 IMPLANT
CANISTER WOUND CARE 500ML ATS (WOUND CARE) ×2 IMPLANT
CHLORAPREP W/TINT 26 (MISCELLANEOUS) ×3 IMPLANT
COVER SURGICAL LIGHT HANDLE (MISCELLANEOUS) ×3 IMPLANT
DRAPE LAPAROSCOPIC ABDOMINAL (DRAPES) ×3 IMPLANT
DRAPE WARM FLUID 44X44 (DRAPES) ×3 IMPLANT
DRSG OPSITE POSTOP 4X10 (GAUZE/BANDAGES/DRESSINGS) IMPLANT
DRSG OPSITE POSTOP 4X8 (GAUZE/BANDAGES/DRESSINGS) IMPLANT
ELECT BLADE 6.5 EXT (BLADE) IMPLANT
ELECT CAUTERY BLADE 6.4 (BLADE) ×3 IMPLANT
ELECT REM PT RETURN 9FT ADLT (ELECTROSURGICAL) ×3
ELECTRODE REM PT RTRN 9FT ADLT (ELECTROSURGICAL) ×2 IMPLANT
GLOVE SURG ENC MOIS LTX SZ6 (GLOVE) ×3 IMPLANT
GLOVE SURG UNDER LTX SZ6.5 (GLOVE) ×3 IMPLANT
GOWN STRL REUS W/ TWL LRG LVL3 (GOWN DISPOSABLE) ×4 IMPLANT
GOWN STRL REUS W/TWL LRG LVL3 (GOWN DISPOSABLE) ×6
HANDLE SUCTION POOLE (INSTRUMENTS) ×2 IMPLANT
KIT BASIN OR (CUSTOM PROCEDURE TRAY) ×3 IMPLANT
KIT TURNOVER KIT B (KITS) ×3 IMPLANT
LIGASURE IMPACT 36 18CM CVD LR (INSTRUMENTS) IMPLANT
NS IRRIG 1000ML POUR BTL (IV SOLUTION) ×6 IMPLANT
PACK GENERAL/GYN (CUSTOM PROCEDURE TRAY) ×3 IMPLANT
PAD ARMBOARD 7.5X6 YLW CONV (MISCELLANEOUS) ×3 IMPLANT
PENCIL SMOKE EVACUATOR (MISCELLANEOUS) ×3 IMPLANT
SPECIMEN JAR LARGE (MISCELLANEOUS) IMPLANT
SPONGE ABD ABTHERA ADVANCE (MISCELLANEOUS) ×1 IMPLANT
SPONGE T-LAP 18X18 ~~LOC~~+RFID (SPONGE) IMPLANT
STAPLER VISISTAT 35W (STAPLE) ×3 IMPLANT
SUCTION POOLE HANDLE (INSTRUMENTS) ×3
SUT PDS AB 1 TP1 96 (SUTURE) ×6 IMPLANT
SUT SILK 2 0 SH CR/8 (SUTURE) ×3 IMPLANT
SUT SILK 2 0 TIES 10X30 (SUTURE) ×3 IMPLANT
SUT SILK 3 0 SH CR/8 (SUTURE) ×3 IMPLANT
SUT SILK 3 0 TIES 10X30 (SUTURE) ×3 IMPLANT
SUT VIC AB 3-0 SH 18 (SUTURE) IMPLANT
TOWEL GREEN STERILE (TOWEL DISPOSABLE) ×3 IMPLANT
TRAY FOLEY MTR SLVR 16FR STAT (SET/KITS/TRAYS/PACK) IMPLANT

## 2021-03-14 NOTE — Anesthesia Postprocedure Evaluation (Signed)
Anesthesia Post Note  Patient: Wayne [name]  Procedure(s) Performed: EXPLORATORY LAPAROTOMY APPLICATION OF ABTHERA ABDOMINAL WOUND VAC     Patient location during evaluation: PACU Anesthesia Type: General Level of consciousness: awake Pain management: pain level controlled Vital Signs Assessment: post-procedure vital signs reviewed and stable Respiratory status: spontaneous breathing Cardiovascular status: stable Postop Assessment: no apparent nausea or vomiting Anesthetic complications: no   No notable events documented.  Last Vitals:  Vitals:   03/16/2021 1400 04/01/2021 1415  BP: 117/86   Pulse: (!) 125 68  Resp: (!) 22 (!) 22  Temp:    SpO2: (!) 89% 97%    Last Pain:  Vitals:   04/07/2021 0822  TempSrc: Oral  PainSc:                  Ritta Hammes

## 2021-03-14 NOTE — Progress Notes (Signed)
°   03/21/2021 0352  Vitals  Temp (!) 97.4 F (36.3 C)  Temp Source Oral  BP (!) 109/46  MAP (mmHg) (!) 64  BP Location Right Arm  BP Method Automatic  Patient Position (if appropriate) Lying  Pulse Rate (!) 124  Pulse Rate Source Monitor  ECG Heart Rate (!) 127  Resp (!) 29  Level of Consciousness  Level of Consciousness Alert  MEWS COLOR  MEWS Score Color Red  Oxygen Therapy  SpO2 92 %  O2 Device Nasal Cannula  O2 Flow Rate (L/min) 3 L/min  MEWS Score  MEWS Temp 0  MEWS Systolic 0  MEWS Pulse 2  MEWS RR 2  MEWS LOC 0  MEWS Score 4  Rapid Response Notification  Name of Rapid Response RN Notified MINDY RN  Date Rapid Response Notified 03/22/2021  Time Rapid Response Notified 0355   Called Mindy Rapid Response R.N. due to Resp. Rapid and H.R. staying up no urine output bladder scan 185. See Mindy R.N. notes

## 2021-03-14 NOTE — Progress Notes (Signed)
PHARMACY - TOTAL PARENTERAL NUTRITION CONSULT NOTE   Indication: Prolonged ileus  Plan: Due to consult being placed after noon today, will start TPN tomorrow Labs and RD consult placed  Thank you for involving pharmacy in this patient's care.  Loura Back, PharmD, BCPS Clinical Pharmacist Clinical phone for 03/23/2021 until 3p is [phone] 03/13/2021 3:57 PM  **Pharmacist phone directory can be found on [email or web address] listed under [facility]**

## 2021-03-14 NOTE — Anesthesia Preprocedure Evaluation (Addendum)
Anesthesia Evaluation  Patient identified by MRN, date of birth, ID band Patient awake  General Assessment Comment:Patient's status as per floor report has worsened since last evening with reported dead bowel. Dr. Chilton Si  Reviewed: Allergy & Precautions, NPO status   Airway Mallampati: II  TM Distance: >3 FB     Dental   Pulmonary Current Smoker and Patient abstained from smoking.,    breath sounds clear to auscultation       Cardiovascular hypertension, + CAD, + Past MI, + Peripheral Vascular Disease and +CHF   Rhythm:Regular Rate:Normal     Neuro/Psych    GI/Hepatic GERD  ,History noted Dr. Haskel Khan    Renal/GU      Musculoskeletal   Abdominal   Peds  Hematology   Anesthesia Other Findings   Reproductive/Obstetrics                             Anesthesia Physical Anesthesia Plan  ASA: 4  Anesthesia Plan: General   Post-op Pain Management:    Induction: Intravenous  PONV Risk Score and Plan: Ondansetron  Airway Management Planned: Oral ETT  Additional Equipment:   Intra-op Plan:   Post-operative Plan: Post-operative intubation/ventilation  Informed Consent:   Plan Discussed with: CRNA, Anesthesiologist and Surgeon  Anesthesia Plan Comments:         Anesthesia Quick Evaluation

## 2021-03-14 NOTE — Procedures (Signed)
Central Venous Catheter Insertion Procedure Note ZACHARIE PORTNER [id number] [birth year]  Procedure: Insertion of Central Venous Catheter Indications: Assessment of intravascular volume, Drug and/or fluid administration, and Frequent blood sampling  Procedure Details Consent: Unable to obtain consent because of emergent medical necessity. Time Out: Verified patient identification, verified procedure, site/side was marked, verified correct patient position, special equipment/implants available, medications/allergies/relevent history reviewed, required imaging and test results available.  Performed  Maximum sterile technique was used including  Skin prep: Chlorhexidine; local anesthetic administered A antimicrobial bonded/coated triple lumen catheter was placed in the left internal jugular vein using the Seldinger technique. Ultrasound guidance used.Yes.   Catheter placed to 20 cm. Blood aspirated via all 3 ports and then flushed x 3. Line sutured x 2 and dressing applied.  Evaluation Blood flow good Complications: No apparent complications Patient did tolerate procedure well. Chest X-ray ordered to verify placement.  CXR: pending.  Brett Canales Emonnie Cannady ACNP Acute Care Nurse Practitioner [facility] Pulmonary/Critical Care Please consult Amion 03/27/2021, 12:57 PM

## 2021-03-14 NOTE — Anesthesia Procedure Notes (Signed)
Arterial Line Insertion Start/End02/01/23 9:20 AM, 04/09/2021 9:25 AM Performed by: CRNA  Patient location: Pre-op. Preanesthetic checklist: patient identified, IV checked, site marked, risks and benefits discussed, surgical consent, monitors and equipment checked, pre-op evaluation, timeout performed and anesthesia consent Lidocaine 1% used for infiltration Left, radial was placed Catheter size: 20 G Hand hygiene performed  and maximum sterile barriers used   Attempts: 1 Procedure performed without using ultrasound guided technique. Following insertion, dressing applied and Biopatch. Post procedure assessment: normal and unchanged

## 2021-03-14 NOTE — Progress Notes (Signed)
Inhaled epoprostenol (VELETRI) Monitoring:  Current dose:  50 ng/kg/min and 5.23 mL/hr IBW: 52.3 kg Duration of each syringe: 9.5 hr Last syringe hung (date/time): ~20:10 and 04-03-2021  Next syringe due (date/time): 05:40 Backup on unit: Yes  Current plan:   Initiating therapy  Sheppard Coil PharmD., BCPS Clinical Pharmacist 03-Apr-2021 6:56 PM

## 2021-03-14 NOTE — Progress Notes (Signed)
Pharmacy Antibiotic Note  Wayne [name] is a 66 y.o. male admitted on 02/11/2021 with  intra-abdominal infection .  Pharmacy has been consulted for Zosyn dosing.  Plan: Zosyn 3.375g IV q8h (4 hour infusion). Discontinue Unasyn.  Monitor renal function, clinical status, and culture results.   Height: 5\' 1"  (154.9 cm) Weight: 50.3 kg (110 lb 14.3 oz) IBW/kg (Calculated) : 52.3  Temp (24hrs), Avg:97.4 F (36.3 C), Min:97 F (36.1 C), Max:97.6 F (36.4 C)  Recent Labs  Lab 02/21/2021 1319 03/09/21 0850 03/11/21 0320 03/12/21 0502 03/12/21 1042 03/13/21 0213 03/13/21 1406 04/04/2021 0628 03/30/2021 1213  WBC  --    < > 8.9 12.0*  --  12.1*  --  33.4* 36.3*  CREATININE  --    < > 0.88 1.02  --   --  1.08 1.25* 0.89  LATICACIDVEN 1.5  --   --   --  1.1  --   --  3.5* 5.4*   < > = values in this interval not displayed.    Estimated Creatinine Clearance: 57.3 mL/min (by C-G formula based on SCr of 0.89 mg/dL).    No Known Allergies  Thank you for allowing pharmacy to be a part of this patients care.  05/12/21, PharmD, BCPS, BCCCP Clinical Pharmacist Please refer to [facility] for [facility] Pharm[facility] numbers 04/04/2021 6:52 PM

## 2021-03-14 NOTE — Anesthesia Procedure Notes (Signed)
Procedure Name: Intubation Date/Time: 04/01/2021 10:08 AM Performed by: Clearnce Sorrel, CRNA Pre-anesthesia Checklist: Patient identified, Emergency Drugs available, Suction available and Patient being monitored Patient Re-evaluated:Patient Re-evaluated prior to induction Oxygen Delivery Method: Circle System Utilized Preoxygenation: Pre-oxygenation with 100% oxygen Induction Type: IV induction, Rapid sequence and Cricoid Pressure applied Ventilation: Mask ventilation without difficulty Laryngoscope Size: Mac and 4 Grade View: Grade I Tube type: Oral Tube size: 7.5 mm Number of attempts: 1 Airway Equipment and Method: Stylet and Oral airway Placement Confirmation: ETT inserted through vocal cords under direct vision, positive ETCO2 and breath sounds checked- equal and bilateral Secured at: 22 cm Tube secured with: Tape Dental Injury: Teeth and Oropharynx as per pre-operative assessment

## 2021-03-14 NOTE — Progress Notes (Signed)
Initial Nutrition Assessment  DOCUMENTATION CODES:   Severe malnutrition in context of chronic illness  INTERVENTION:   - TPN management per Pharmacy  Monitor magnesium, potassium, and phosphorus BID for at least 3 days, MD to replete as needed, as pt is at risk for refeeding syndrome given severe malnutrition, minimal PO intake since 03/06/21 (x 8 days)  - d/c MVI with minerals daily  NUTRITION DIAGNOSIS:   Severe Malnutrition related to chronic illness (CHF, COPD) as evidenced by severe muscle depletion, severe fat depletion.  GOAL:   Patient will meet greater than or equal to 90% of their needs  MONITOR:   Vent status, Labs, Skin, I & O's, Weight trends  REASON FOR ASSESSMENT:   Consult New TPN/TNA  ASSESSMENT:   66 year old male who presented to the ED on 12/22 with RLE numbness. PMH of aortobifemoral bypass for claudication in 2019, EtOH abuse, anemia, CHF, COPD, GERD, HTN, moderate mitral regurgitation, MI, PVD. Pt admitted with CT evidence of occluded R limb of his aortobifemoral graft with pseudoaneurysm in the groin.  12/23 - s/p re-exploration R groin, repair of R common femoral pseudoaneurysm, ligation of R common femoral artery, sartorial muscle flap coverage of both R femoral graft, VAC placement 12/31 - CT scan obtained with concern for edematous segment of bowel in the distal ileum concerning for ischemia, NPO, s/p emergent diagnostic laparoscopy showing normal appearing bowel 01/01 - clear liquids 01/02 - regular diet 01/03 - CT scan obtained revealing extensive mesenteric edema that has progressed 01/04 - briefly NPO then back to regular diet 01/06 - rapid response overnight, significant change in abdominal exam, NPO, transferred to ICU, s/p ex-lap, reduction of small bowel volvulus, placement of ABThera wound VAC, intubated  RD consulted for new TPN. TPN to start tomorrow 1/07 due to consult being received after TPN compounding hours.  In OR today, pt  found to have diffuse small bowel ischemia secondary to mesenteric volvulus. Pt now with vent dependent respiratory failure secondary to sepsis. Pt with OG tube in stomach, currently to low intermittent suction with dark brown/black liquid output. Per Surgery note, pt will require abdominal reexploration in 24-48 hours. Abdomen remains open with VAC in place.  Reviewed documented PO intakes since admission. Most PO intake at meals documented as 0%. One meal completion of 50% documented on 12/29 and 2 meal completions of 50% documented on 12/28. One meal completion of 100% and once meal completion of 50% documented on 12/27. Unable to obtain further diet history from pt at this time.  Reviewed available weight history in chart. Weight has slowly been trending down since May 2019. However, current weight of 50.3 kg is likely falsely elevated due to fluid resuscitation/volume overload. Pt's lowest weight this admission was 43.8 kg (underweight).  Admit weight: 45.4 kg Current weight: 50.3 kg Lowest weight: 43.8 kg  Pt meets criteria for severe chronic malnutrition. Pt also with severe acute malnutrition superimposed on chronic malnutrition. Pt is at high risk for refeeding after initiation of TPN.  Patient is currently intubated on ventilator support MV: 9 L/min Temp (24hrs), Avg:97.4 F (36.3 C), Min:97.3 F (36.3 C), Max:97.6 F (36.4 C) BP (a-line): 85/58 MAP (a-line): 67  Drips: Fentanyl Levophed LR: 75 ml/hr  Medications reviewed and include: colace, pepcid, lasix, MVI with minerals, protonix, IV abx  Labs reviewed: magnesium 2.8, lactic acid 5.4 CBG's: 132, 140  UOP: 1325 ml x 24 hours I/O's: +1.6 L since admission  NUTRITION - FOCUSED PHYSICAL EXAM:  Flowsheet Row Most  Recent Value  Orbital Region Severe depletion  Upper Arm Region Severe depletion  Thoracic and Lumbar Region Severe depletion  Buccal Region Unable to assess  Temple Region Moderate depletion  Clavicle  Bone Region Severe depletion  Clavicle and Acromion Bone Region Severe depletion  Scapular Bone Region Unable to assess  Dorsal Hand Severe depletion  Patellar Region Severe depletion  Anterior Thigh Region Severe depletion  Posterior Calf Region Severe depletion  Edema (RD Assessment) None  Hair Reviewed  Eyes Reviewed  Mouth Unable to assess  Skin Reviewed  Nails Reviewed       Diet Order:   Diet Order             Diet NPO time specified  Diet effective now           Diet - low sodium heart healthy                   EDUCATION NEEDS:   Not appropriate for education at this time  Skin:  Skin Assessment: Skin Integrity Issues: Wound VAC: abdomen  Last BM:  03/09/21  Height:   Ht Readings from Last 1 Encounters:  03/12/2021 5\' 1"  (1.549 m)    Weight:   Wt Readings from Last 1 Encounters:  04/07/2021 50.3 kg    BMI:  Body mass index is 20.95 kg/m.  Estimated Nutritional Needs:   Kcal:  1700-1900  Protein:  80-95 grams  Fluid:  >/= 1.5 L    Gustavus Bryant, MS, RD, LDN Inpatient Clinical Dietitian Please see AMiON for contact information.

## 2021-03-14 NOTE — Progress Notes (Signed)
PHARMACY - TOTAL PARENTERAL NUTRITION CONSULT NOTE   Indication: Prolonged ileus  Patient Measurements: Height: 5' 1"  (154.9 cm) Weight: 50.3 kg (110 lb 14.3 oz) IBW/kg (Calculated) : 52.3 TPN AdjBW (KG): 45.4 Body mass index is 20.95 kg/m. Usual Weight: 45.4 kg  Assessment: 39 yom who presented to [facility] on 02/08/2021 with occluded right common femoral artery bypass graft with pseudoaneurysm s/p repair 02/28/21. He complained of abdominal pain for several days post-op, CT on 12/31 concerning for ischemia however diagnostic lap was neg. Repeat CT on 1/3 showed progressive mesenteric edema. Overnight on 1/6 his abdominal exam changed and he was taken emergently to the OR for ex-lap. 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 He has had minimal PO intake since 12/29 and is at high risk for refeeding syndrome. Pharmacy consulted for management of TPN.   Glucose / Insulin: prediabetes, no meds PTA, BG <180 pre steroid (s/p dexameth 10 mg on 1/6) Electrolytes: Na 138, K 3.7 (goal >= 4), Mg 2.8 (goal >=2), CoCa 8.8, CO2 low/Cl WNL Renal: SCr up 1.25 (baseline <1), BUN 16 Hepatic: AST mildly elevated 65, ALT WNL, Tbili up 1.3, TG / Alk Phos WNL, albumin down 1.6 Intake / Output; MIVF: UOP 1 ml/kg/hr although minimal UOP in recent hrs, LR 150 ml/hr, NGT output 1180 ml, drain output 2500 ml, net +3.2L GI Imaging: 12/31 CT A/P: diffusely edematous segment of bowel in the region of the terminal ileum and proximal cecum concern for ischemic bowel 1/3 CT A/P: progressive mesenteric edema 1/6 AXR: multiple mildly dilated loops of gas-filled small bowel R > L which may reflect ileus 1/7 AXR: mild gaseous distention of bowel involving both large and small bowel, likely mild ileus GI Surgeries / Procedures:  12/31: diagnostic laparoscopy - neg for ischemic bowel 1/6: emergent ex-lap, reduction SB volvulus, abd wound VAC placement 1/7: to OR to extend midline  laparotomy 1/8: plan for re-exploration  Central access: CVC 1/6 TPN start date: 1/7  Nutritional Goals: Goal TPN rate is 70 mL/hr (provides 85 g of protein and 1803 kcals per day)  RD Assessment: Estimated Needs Total Energy Estimated Needs: 1700-1900 Total Protein Estimated Needs: 80-95 grams Total Fluid Estimated Needs: >/= 1.5 L  Current Nutrition:  NPO and TPN  Plan:  Start TPN at 68m/hr at 1800 (provides 36 g protein, 108 g dextrose, 26 g lipids, 774 kcal, meeting 45% needs)   Electrolytes in TPN: Na 561m/L, K 5068mL, Ca 5mE49m, Mg 5mEq56m and Phos 15mmo58m Cl:Ac 1:1 Add standard MVI and trace elements to TPN Add famotidine 40 mg to TPN Initiate Sensitive q6h SSI and adjust as needed  MIVF management per MD Monitor TPN labs on Mon/Thurs, daily until refeeding risk resolves  KCl 10 meq IV q1h x2 - conservative replacement given UOP has decreased  Thank you for involving pharmacy in this patient's care.  JennifRenold GentamD, BCPS Clinical Pharmacist Clinical phone for 04/03/2021 until 3p is [phone] 8:13 AM  **Pharmacist phone directory can be found on amion[facility]

## 2021-03-14 NOTE — Progress Notes (Signed)
GI ATTENDING Follow up   Interval events and operative findings noted. Will sign off.  Wayne [name]. Eda Keys., M.D. [facility] Division of Gastroenterology

## 2021-03-14 NOTE — Op Note (Addendum)
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  Patient has a very narrow, mobile small bowel mesentery   Specimens: no Retained items: ABThera EBL: Minimal cc Complications: Patient likely aspirated on induction of anesthesia.   Description of procedure: After obtaining informed consent the patient was taken to the operating room and placed supine on operating room table where general endotracheal anesthesia was initiated, preoperative antibiotics were administered, SCDs applied, and a formal timeout was performed.  The abdomen was prepped and draped in usual sterile fashion.  A midline laparotomy was created and scant omental adhesions to the intra-abdominal wall lysed with cautery.  The entirety of the small bowel was massively distended and severely ischemic appearing.  The transverse colon and stomach appeared viable, but the stomach is also massively dilated with a well-placed NG tube palpable.  The ligament of Treitz was identified and the bowel noted to be dusky at this location.  The bowel was followed distally and several interloop adhesions noted which were nonobstructive were left in situ.  The entirety of the small bowel appeared dark purple and severely ischemic.  As we approached the terminal ileum, it was noted that the small bowel was completely volvulized around its mesentery.  This was rotated 360 degrees counterclockwise, relieving the volvulus.  Initially the appendix and very distal aspect of the cecum appeared ischemic as well.  Once the volvulus had been reduced, the color of the most proximal small bowel as well as cecum and appendix immediately  improved, and the small bowel in the interval seem to be improving slightly.  The bowel was placed back in the abdomen and covered with a damp warm towel for several minutes and then reinspected, some improvement was noted although there was still quite a long segment of threatened looking small bowel. With the volvulus reduced, with ongoing resuscitation, the hope is that he will have continued improvement in the viability of the small bowel and can avoid a nonsurvivable resection/short gut.  The bowel was returned to the abdominal cavity and an ABThera wound VAC was placed.  The patient will be transported to the ICU, remains in critical condition.  He will need abdominal reexploration in 24 to 48 hours depending on clinical course.      All counts were correct at the completion of the case.

## 2021-03-14 NOTE — Progress Notes (Signed)
ABG results given to Dr Lynetta Mare. Pts RR was set at 22 and PEEP increased to 10 per MD order.

## 2021-03-14 NOTE — Progress Notes (Addendum)
°  Progress Note    03/29/2021 7:57 AM 6 Days Post-Op  Subjective:  rapid response overnight with tachycardia and tachypnea   Vitals:   03/25/2021 0654 03/20/2021 0700  BP: (!) 90/54 94/60  Pulse: (!) 132 (!) 138  Resp: (!) 32 (!) 33  Temp: 97.6 F (36.4 C) 97.6 F (36.4 C)  SpO2: 94% 100%   Physical Exam: Cardiac: tachy Lungs:  labored with O2 by [name] Incisions:  R groin with serosanguinous drainage Extremities:  feet warm Abdomen:  tight, tender; thin drainage from abd incision Neurologic: A&O  CBC    Component Value Date/Time   WBC 33.4 (H) 04/01/2021 0628   RBC 2.84 (L) 03/29/2021 0628   HGB 9.8 (L) 03/27/2021 0628   HCT 29.1 (L) 03/11/2021 0628   PLT 511 (H) 04/06/2021 0628   MCV 102.5 (H) 03/28/2021 0628   MCH 34.5 (H) 03/17/2021 0628   MCHC 33.7 03/10/2021 0628   RDW 15.7 (H) 03/22/2021 0628   LYMPHSABS PENDING 04/03/2021 0628   MONOABS PENDING 03/24/2021 0628   EOSABS PENDING 03/20/2021 0628   BASOSABS PENDING 03/27/2021 0628    BMET    Component Value Date/Time   NA 139 03/17/2021 0628   K 3.4 (L) 03/27/2021 0628   CL 105 03/24/2021 0628   CO2 22 03/09/2021 0628   GLUCOSE 149 (H) 03/19/2021 0628   BUN 13 03/27/2021 0628   CREATININE 1.25 (H) 03/30/2021 0628   CALCIUM 8.8 (L) 03/19/2021 0628   GFRNONAA >60 03/26/2021 0628   GFRAA >60 06/21/2017 0305    INR    Component Value Date/Time   INR 1.1 03/02/2021 1647     Intake/Output Summary (Last 24 hours) at 03/15/2021 0757 Last data filed at 04/06/2021 4010 Gross per 24 hour  Intake 900 ml  Output 1325 ml  Net -425 ml     Assessment/Plan:  66 y.o. male s/p repair of R CFA pseudoaneurysm 6 Days Post-Op   Acute change in abd exam overnight more distended and tender.  Lactic acid 3.5 and WBC up to 33 and oliguric.  He continues to be tachycardic and tachypneic.  Transfer orders for ICU placed and CCM consulted   Addendum 1/6: patient re-evaluated by Surgery and they will take him back to OR for  exp lap   Emilie Rutter, PA-C [facility] [phone] 03/09/2021 7:57 AM  I have independently interviewed and examined patient and agree with PA assessment and plan above. General surgery planning exploratory laparotomy today. Unfortunately he has severely decompensated.  Berthe Oley C. Randie Heinz, MD [facility] [place] Office: [phone] Pager: [phone]

## 2021-03-14 NOTE — Significant Event (Addendum)
Rapid Response Event Note   Reason for Call :  Tachypnea-40s, tachycardia-130s  Initial Focused Assessment:  Pt lying in bed with eyes closed, with labored breathing. He is lying on his L side. He is alert and oriented, denies chest pain/abd pain at this time. Lungs clear t/o. ABD large/distended, faint bowel sounds. Skin cool to touch. Pt has had no urine output tonight-bladder scan showing 185cc.   T-97.4, HR-130, BP-97/57, RR-28, SpO2-88-92% on 3L [place].    Interventions:  Fleet's enema given-no stool output PCXR-Rotated film with retrocardiac left base collapse/consolidation and possible small left pleural effusion. ABG-7.36/40/43.3/22.6(venous sample) CBC BMP LA Xopenex tx  Plan of Care:  Await lab results and relay to MD. Continue to monitor pt closely. Call RRT if further assistance needed.   Event Summary:   MD Notified: Dr. Stanford Breed Call [place] Time:0410 End [id number]  Dillard Essex, RN

## 2021-03-14 NOTE — Progress Notes (Signed)
ABG results given to Dr Agarwala. No new orders at this time. 

## 2021-03-14 NOTE — Progress Notes (Signed)
Patient ID: Wayne [name], male   DOB: 09-01-1955, 66 y.o.   MRN: [id number] [facility] Surgery Progress Note  6 Days Post-Op  Subjective: Events from overnight noted.  No UOP since shift change last night, now tachy, with tachypnea, and hypotension.  His WBC is up to 33K and lactic acid now 3.5 from 1.2 yesterday.  No BMs   Objective: Vital signs in last 24 hours: Temp:  [97.3 F (36.3 C)-98.4 F (36.9 C)] 97.6 F (36.4 C) (01/06 0700) Pulse Rate:  [42-138] 138 (01/06 0700) Resp:  [20-38] 33 (01/06 0700) BP: (90-157)/(46-80) 94/60 (01/06 0700) SpO2:  [90 %-100 %] 100 % (01/06 0700) Weight:  [50.3 kg] 50.3 kg (01/06 0100) Last BM Date: 03/09/21  Intake/Output from previous day: 01/05 0701 - 01/06 0700 In: 900 [I.V.:900] Out: 1325 [Urine:1325] Intake/Output this shift: No intake/output data recorded.  PE: Gen:  Alert, appears ill but conversant Heart: tachy Lungs: tachypnea on 6L [place] Abd: distended, tight, diffusely tender with some guarding and early peritoneal signs, clear serous drainage out of his lap incision on the left side  Lab Results:  Recent Labs    03/13/21 0213 03/24/2021 0628  WBC 12.1* 33.4*  HGB 11.4* 9.8*  HCT 34.5* 29.1*  PLT 523* 511*   BMET Recent Labs    03/13/21 1406 03/16/2021 0628  NA 137 139  K 3.3* 3.4*  CL 103 105  CO2 23 22  GLUCOSE 131* 149*  BUN 8 13  CREATININE 1.08 1.25*  CALCIUM 8.7* 8.8*   PT/INR No results for input(s): LABPROT, INR in the last 72 hours. CMP     Component Value Date/Time   NA 139 03/22/2021 0628   K 3.4 (L) 03/10/2021 0628   CL 105 03/29/2021 0628   CO2 22 03/17/2021 0628   GLUCOSE 149 (H) 03/30/2021 0628   BUN 13 04/08/2021 0628   CREATININE 1.25 (H) 04/04/2021 0628   CALCIUM 8.8 (L) 03/19/2021 0628   PROT 6.3 (L) 03/13/2021 1406   ALBUMIN 2.8 (L) 03/13/2021 1406   AST 24 03/13/2021 1406   ALT 21 03/13/2021 1406   ALKPHOS 57 03/13/2021 1406   BILITOT 1.0 03/13/2021 1406   GFRNONAA >60  03/21/2021 0628   GFRAA >60 06/21/2017 0305   Lipase     Component Value Date/Time   LIPASE 24 06/11/2017 1502       Studies/Results: DG Chest Port 1 View  Result Date: 03/28/2021 CLINICAL DATA:  Respiratory distress. EXAM: PORTABLE CHEST 1 VIEW COMPARISON:  02/25/2021 FINDINGS: 0452 hours. Leftward patient rotation. There is retrocardiac left base collapse/consolidation with possible small left pleural effusion. Right lung clear. Architectural distortion/scarring noted in both lungs suggesting emphysema. Telemetry leads overlie the chest. IMPRESSION: Rotated film with retrocardiac left base collapse/consolidation and possible small left pleural effusion. Electronically Signed   By: Kennith Center M.D.   On: 03/26/2021 06:24    Anti-infectives: Anti-infectives (From admission, onward)    Start     Dose/Rate Route Frequency Ordered Stop   03/09/21 1430  doxycycline (VIBRA-TABS) tablet 100 mg        100 mg Oral Every 12 hours 03/09/21 1342     03/09/21 1430  amoxicillin-clavulanate (AUGMENTIN) 875-125 MG per tablet 1 tablet        1 tablet Oral Every 12 hours 03/09/21 1342     03/19/2021 1700  vancomycin (VANCOREADY) IVPB 750 mg/150 mL  Status:  Discontinued        750 mg 150 mL/hr over 60  Minutes Intravenous Every 24 hours 02/24/2021 1423 03/09/21 1342   03/02/2021 1600  Ampicillin-Sulbactam (UNASYN) 3 g in sodium chloride 0.9 % 100 mL IVPB  Status:  Discontinued        3 g 200 mL/hr over 30 Minutes Intravenous Every 6 hours 03/07/2021 1423 03/09/21 1342   03/07/21 1200  doxycycline (VIBRA-TABS) tablet 100 mg  Status:  Discontinued        100 mg Oral Every 12 hours 03/07/21 1109 02/11/2021 1407   03/07/21 1200  amoxicillin-clavulanate (AUGMENTIN) 875-125 MG per tablet 1 tablet  Status:  Discontinued        1 tablet Oral Every 12 hours 03/07/21 1109 03/07/2021 1407   03/06/21 2122  vancomycin (VANCOCIN) IVPB 1000 mg/200 mL premix  Status:  Discontinued        1,000 mg 200 mL/hr over 60 Minutes  Intravenous Every 24 hours 03/06/21 0519 03/07/21 1109   03/06/21 1330  amoxicillin (AMOXIL) capsule 500 mg        500 mg Oral  Once 03/06/21 1238 03/06/21 1436   03/01/21 1000  vancomycin (VANCOCIN) IVPB 1000 mg/200 mL premix  Status:  Discontinued        1,000 mg 200 mL/hr over 60 Minutes Intravenous Every 36 hours 02/28/21 0326 03/06/21 0519   02/28/21 1600  ceFEPIme (MAXIPIME) 2 g in sodium chloride 0.9 % 100 mL IVPB  Status:  Discontinued        2 g 200 mL/hr over 30 Minutes Intravenous Every 12 hours 02/28/21 0326 03/07/21 1109   02/28/21 0415  ceFEPIme (MAXIPIME) 2 g in sodium chloride 0.9 % 100 mL IVPB        2 g 200 mL/hr over 30 Minutes Intravenous  Once 02/28/21 0315 02/28/21 0530   02/22/2021 2232  vancomycin (VANCOCIN) 1-5 GM/200ML-% IVPB       Note to Pharmacy: Brien Mates D: cabinet override      03/05/2021 2232 02/28/21 0319        Assessment/Plan S/p repair of right aortobifemoral graft pseudoaneurysm 02/26/2021 Dr. Randie Heinz POD 6, S/p diagnostic laparoscopy 02/08/2021 Dr. Derrell Lolling - negative for ischemic bowel Abdominal pain, unclear etiology - labs are worsening today with significant leukocytosis, elevated lactic acid, hypotension, tachycardia, respiratory failure, etc.  At this point, we offered return to the OR for exploratory laparotomy vs conservative management and or comfort care.  We discussed that he is very high risk for surgery and he has a high risk for post operative complications due to his co-morbidities, malnutrition, and overall poor health.  He understands and states he wants to proceed with surgical intervention.  He asked me to call his sister, Ms. Wayne [name].  I got her voicemail and let her a message.   -he has been discussed with the primary service who agrees with offering laparotomy -CCM has been consulted and has seen him during my visit as well and we have briefly discussed his situation as well.  He is being tx to ICU post op. -foley placed at bedside  with 350cc returned  ID - currently augmentin/ doxycycline 1/1>> stopped augmentin 1/6, zosyn 1/6 --> FEN - NPO, IVFs VTE - sq heparin Foley - just placed, 350cc return  HTN HLD GERD COPD CHF Pulmonary HTN Hx alcohol abuse Hx PUD Acute respiratory failure - on 6L [place] Lactic acidosis Leukocytosis Tachycardia  High MDM   LOS: 15 days    Letha Cape, [facility] Surgery 04/06/2021, 8:11 AM Please see Amion for pager number during  day hours 7:00am-4:30pm

## 2021-03-14 NOTE — Transfer of Care (Signed)
Immediate Anesthesia Transfer of Care Note  Patient: Graceann Congress  Procedure(s) Performed: EXPLORATORY LAPAROTOMY APPLICATION OF ABTHERA ABDOMINAL WOUND VAC  Patient Location: SICU  Anesthesia Type:General  Level of Consciousness: Patient remains intubated per anesthesia plan  Airway & Oxygen Therapy: Patient remains intubated per anesthesia plan  Post-op Assessment: Report given to RN and Post -op Vital signs reviewed and stable  Post vital signs: Reviewed and stable  Last Vitals:  Vitals Value Taken Time  BP    Temp    Pulse    Resp    SpO2      Last Pain:  Vitals:   2021-03-21 0822  TempSrc: Oral  PainSc:       Patients Stated Pain Goal: 1 (03/09/21 0407)  Complications: No notable events documented.

## 2021-03-14 NOTE — Progress Notes (Signed)
Mobility Specialist Progress Note:   03/24/2021 1042  Mobility  Activity Off unit   Will follow- up as time allows.   [facility] Designer, jewellery Phone [phone] Secondary Phone [phone]

## 2021-03-14 NOTE — Consult Note (Signed)
NAME:  Wayne [name], MRN:  [id number], DOB:  [birth year], LOS: 75 ADMISSION DATE:  02/22/2021, CONSULTATION DATE: 03/16/2021 REFERRING MD: [facility], CHIEF COMPLAINT: Sepsis small bowel ischemia obstruction  History of Present Illness:  66 year old male is originally admitted 14 days prior to this note with right femoral pseudoaneurysm repair per vascular surgery.  He has had a rocky course over the last 2 weeks and on 03/12/2021 during the night and increasing white count, hypotension, fever and generalized failure to thrive.  Surgery was called to the bedside and elected to take him urgently to the operating room for open laparotomy and found small bowel ischemia bulbous with repair.  He is returning from the OR with open abdomen with wound VAC in place.  Unfortunately he aspirated during intubation and is on full mechanical ventilatory support at this time.  He is not requiring vasopressor support at this time but his prognosis is extremely guarded.  Pulmonary critical care has been asked to assist in his care.  Notably he received epinephrine in the operating room along with 4 units of packed red blood cells.  Pertinent  Medical History   Past Medical History:  Diagnosis Date   Abnormal pulmonary finding    a. preadmission note indicates pt followed by Dr. Gwenevere Ghazi for CHF, lung mass, COPD, pulmonary mycosis with plan for 3 mo f/u with CT by 03/2017 note.   Alcohol abuse    Anemia    Arthritis    "right hip; lower back" (05/11/2017)   Chronic systolic CHF (congestive heart failure) ([place])    a. EF 20-25% by echo 2017. b. 40-45% by echo in 01/2017.   COPD (chronic obstructive pulmonary disease) (HCC)    Dyspnea    GERD (gastroesophageal reflux disease)    History of blood transfusion 2004   "related to bleeding stomach ulcerts"   History of stomach ulcers 2004   Hypercholesterolemia    Hypertension    Malnutrition (HCC)    Moderate mitral regurgitation    Myocardial infarction [facility])  2004   "think it was bleeding ulcers; not a heart attack" (05/11/2017), pt denies heart cath   Peripheral vascular disease ([place])    Pre-diabetes    Pulmonary hypertension ([place])    a. severe pulm HTN by echo 01/2017.   Tricuspid regurgitation 2017     Significant Hospital Events: Including procedures, antibiotic start and stop dates in addition to other pertinent events   04/06/2021 to operating room for open laparotomy  Interim History / Subjective:  04/04/2021 taken to the OR emergently for abdominal distention  Objective   Blood pressure (!) 107/58, pulse (!) 132, temperature 97.6 F (36.4 C), temperature source Oral, resp. rate (!) 34, height 5\' 3"  (1.6 m), weight 50.3 kg, SpO2 95 %.        Intake/Output Summary (Last 24 hours) at 04/07/2021 1147 Last data filed at 03/20/2021 1137 Gross per 24 hour  Intake 4910 ml  Output 1450 ml  Net 3460 ml   Filed Weights   03/02/2021 1624 03/03/21 0407 03/25/2021 0100  Weight: 45.4 kg 43.8 kg 50.3 kg    Examination: General: Frail cachectic male in obvious distress HENT: Currently intubated Lungs: Sounds throughout Cardiovascular: Heart sounds are distant Abdomen: Active place Extremities: Noted Neuro: Event   Resolved Hospital Problem list     Assessment & Plan:  Ventilator dependent respiratory failure secondary to sepsis.  Small bowel obstruction following aspiration during surgical intervention on 03/23/2021 Arterial blood gases Ventilator adjustment per arterial  blood gas No weaning for the next 24 hours Most likely will have to return to the operating room for abdominal closure Expand antibiotics to vancomycin and Zosyn  Sepsis most likely from abdominal source these had recent surgery right femoral repair of femoral pseudoaneurysm. Vasopressor support as needed Microbial therapy Follow culture data  History of left femoral artery repair Per vascular surgery Per vascular  Blood loss anemia Recent Labs    03/13/21 0213  04/06/2021 0628  HGB 11.4* 9.8*  Transfuse per protocol  Ischemic small bowel Open abdominal wound from surgery 03/21/2021 Per surgeon  Malnourished Questionable TPN  Poor prognosis Ongoing discussions with family    Best Practice (right click and "Reselect all SmartList Selections" daily)   Diet/type: NPO DVT prophylaxis: not indicated GI prophylaxis: PPI Lines: Central line and Arterial Line Foley:  Yes, and it is still needed Code Status:  full code Last date of multidisciplinary goals of care discussion [tbd]  Labs   CBC: Recent Labs  Lab 03/09/21 0850 03/11/21 0320 03/12/21 0502 03/13/21 0213 03/25/2021 0628  WBC 18.5* 8.9 12.0* 12.1* 33.4*  NEUTROABS  --   --   --   --  30.7*  HGB 10.5* 10.1* 11.1* 11.4* 9.8*  HCT 32.2* 29.9* 32.3* 34.5* 29.1*  MCV 103.5* 101.0* 99.7 101.2* 102.5*  PLT 365 399 453* 523* 511*    Basic Metabolic Panel: Recent Labs  Lab 03/09/21 0850 03/11/21 0320 03/12/21 0502 03/13/21 1406 03/16/2021 0628  NA 140 136 137 137 139  K 3.9 2.7* 3.5 3.3* 3.4*  CL 106 102 102 103 105  CO2 20* 24 24 23 22   GLUCOSE 99 85 97 131* 149*  BUN 9 6* 6* 8 13  CREATININE 1.26* 0.88 1.02 1.08 1.25*  CALCIUM 8.6* 8.0* 8.5* 8.7* 8.8*   GFR: Estimated Creatinine Clearance: 40.8 mL/min (A) (by C-G formula based on SCr of 1.25 mg/dL (H)). Recent Labs  Lab 03/07/2021 1319 03/09/21 0850 03/11/21 0320 03/12/21 0502 03/12/21 1042 03/13/21 0213 03/29/2021 0628  WBC  --    < > 8.9 12.0*  --  12.1* 33.4*  LATICACIDVEN 1.5  --   --   --  1.1  --  3.5*   < > = values in this interval not displayed.    Liver Function Tests: Recent Labs  Lab 03/13/21 1406  AST 24  ALT 21  ALKPHOS 57  BILITOT 1.0  PROT 6.3*  ALBUMIN 2.8*   No results for input(s): LIPASE, AMYLASE in the last 168 hours. No results for input(s): AMMONIA in the last 168 hours.  ABG    Component Value Date/Time   PHART 7.367 03/27/2021 0502   PCO2ART 40.0 03/23/2021 0502   PO2ART  43.3 (L) 03/27/2021 0502   HCO3 22.6 03/23/2021 0502   TCO2 25 02/18/2021 2302   ACIDBASEDEF 2.1 (H) 04/07/2021 0502   O2SAT 76.0 03/09/2021 0502     Coagulation Profile: No results for input(s): INR, PROTIME in the last 168 hours.  Cardiac Enzymes: No results for input(s): CKTOTAL, CKMB, CKMBINDEX, TROPONINI in the last 168 hours.  HbA1C: Hgb A1c MFr Bld  Date/Time Value Ref Range Status  04/13/2017 08:35 AM 6.1 (H) 4.8 - 5.6 % Final    Comment:    (NOTE) Pre diabetes:          5.7%-6.4% Diabetes:              >6.4% Glycemic control for   <7.0% adults with diabetes     CBG:  Recent Labs  Lab 03/16/2021 0838  GLUCAP 132*    Review of Systems:   na  Past Medical History:  He,  has a past medical history of Abnormal pulmonary finding, Alcohol abuse, Anemia, Arthritis, Chronic systolic CHF (congestive heart failure) ([place]), COPD (chronic obstructive pulmonary disease) ([place]), Dyspnea, GERD (gastroesophageal reflux disease), History of blood transfusion (2004), History of stomach ulcers (2004), Hypercholesterolemia, Hypertension, Malnutrition ([place]), Moderate mitral regurgitation, Myocardial infarction ([place]) (2004), Peripheral vascular disease ([place]), Pre-diabetes, Pulmonary hypertension ([place]), and Tricuspid regurgitation (2017).   Surgical History:   Past Surgical History:  Procedure Laterality Date   ABDOMINAL AORTOGRAM W/LOWER EXTREMITY N/A 04/01/2017   Procedure: ABDOMINAL AORTOGRAM W/LOWER EXTREMITY;  Surgeon: Conrad Weldon, MD;  Location: [place] [facility];  Service: Cardiovascular;  Laterality: N/A;  Bilateral   AORTA - BILATERAL FEMORAL ARTERY BYPASS GRAFT N/A 06/01/2017   Procedure: AORTA BIFEMORAL BYPASS GRAFT with Graft and Right Femoral Endartarectomy;  Surgeon: Conrad Whitfield, MD;  Location: [place];  Service: Vascular;  Laterality: N/A;   AORTA - BILATERAL FEMORAL ARTERY BYPASS GRAFT Right 02/10/2021   Procedure: INTERPOSITION AORTA BIFEMORAL TO PROFUNDA BYPASS GRAFT;   Surgeon: Waynetta Sandy, MD;  Location: [place];  Service: Vascular;  Laterality: Right;   APPLICATION OF WOUND VAC Right 02/19/2021   Procedure: APPLICATION OF WOUND VAC;  Surgeon: Waynetta Sandy, MD;  Location: [place];  Service: Vascular;  Laterality: Right;   COLONOSCOPY     FEMORAL-POPLITEAL BYPASS GRAFT Right 02/20/2021   Procedure: Repair of Right Femoral artery possible femoral bypass;  Surgeon: Waynetta Sandy, MD;  Location: [place];  Service: Vascular;  Laterality: Right;   [place] N/A 03/08/2021   Procedure: LAPAROSCOPY DIAGNOSTIC;  Surgeon: Ralene Ok, MD;  Location: [place];  Service: General;  Laterality: N/A;   MASS EXCISION Left 04/13/2017   Procedure: EXCISION EPIDERMAL INCLUSION CYST LEFT GROIN;  Surgeon: Conrad Matoaka, MD;  Location: [place];  Service: Vascular;  Laterality: Left;   MUSCLE FLAP CLOSURE Right 02/14/2021   Procedure: MUSCLE FLAP CLOSURE;  Surgeon: Waynetta Sandy, MD;  Location: [place];  Service: Vascular;  Laterality: Right;   PERIPHERAL VASCULAR INTERVENTION Left    "groin; for clogged artery"     Social History:   reports that he has been smoking cigarettes. He has a 12.50 pack-year smoking history. He has never used smokeless tobacco. He reports current alcohol use of about 8.0 standard drinks per week. He reports that he does not use drugs.   Family History:  His family history includes Diabetes in his sister; Hyperlipidemia in his sister; Hypertension in his sister; Peripheral Artery Disease in his father.   Allergies Allergies  Allergen Reactions   Penicillins Itching, Rash and Other (See Comments)    PATIENT HAS HAD A PCN REACTION WITH IMMEDIATE RASH, FACIAL/TONGUE/THROAT SWELLING, SOB, OR LIGHTHEADEDNESS WITH HYPOTENSION:  #  #  #  YES  #  #  #   Has patient had a PCN reaction causing severe rash involving mucus membranes or skin necrosis: No Has patient had a  PCN reaction that required hospitalization: No Has patient had a PCN reaction occurring within the last 10 years: No If all of the above answers are "NO", then may proceed with Cephalosporin use. Pt. Doesn't remember 67yrs. ago     Home Medications  Prior to Admission medications   Medication Sig Start Date End Date Taking? Authorizing Provider  acetaminophen (  TYLENOL) 500 MG tablet Take 2 tablets (1,000 mg total) by mouth every 6 (six) hours as needed. Patient taking differently: Take 250 mg by mouth daily as needed for moderate pain or headache. 02/17/16  Yes Montine Circle, PA-C  albuterol (PROVENTIL HFA;VENTOLIN HFA) 108 (90 Base) MCG/ACT inhaler Inhale 2 puffs into the lungs every 6 (six) hours as needed for wheezing or shortness of breath.   Yes [provider]  aspirin EC 81 MG tablet Take 81 mg by mouth daily.   Yes [provider]  BREZTRI AEROSPHERE 160-9-4.8 MCG/ACT AERO Inhale 1 puff into the lungs daily. 02/24/21  Yes [provider]  famotidine (PEPCID) 20 MG tablet Take 20 mg by mouth 2 (two) times daily before a meal.   Yes [provider]  furosemide (LASIX) 20 MG tablet Take 1 tablet (20 mg total) by mouth daily. 05/15/17  Yes Mikhail, [place], DO  Multiple Vitamin (MULTIVITAMIN WITH MINERALS) TABS tablet Take 1 tablet by mouth daily.   Yes [provider]  omeprazole (PRILOSEC) 20 MG capsule Take 1 capsule (20 mg total) by mouth daily as needed (for indegestion). 05/14/17  Yes Mikhail, Velta Add[place], DO  rosuvastatin (CRESTOR) 40 MG tablet Take 40 mg by mouth daily. 02/17/21  Yes [provider]     Critical care time: 45 min    Wayne [name] ACNP [name] Pract[facility] [employer] Please consult Amion 03/23/2021, 11:47 AM

## 2021-03-15 ENCOUNTER — Inpatient Hospital Stay (HOSPITAL_COMMUNITY): Payer: Medicare Other

## 2021-03-15 ENCOUNTER — Encounter (HOSPITAL_COMMUNITY): Admission: EM | Disposition: E | Payer: Self-pay | Source: Home / Self Care | Attending: Vascular Surgery

## 2021-03-15 ENCOUNTER — Inpatient Hospital Stay (HOSPITAL_COMMUNITY): Payer: Medicare Other | Admitting: Anesthesiology

## 2021-03-15 DIAGNOSIS — I428 Other cardiomyopathies: Secondary | ICD-10-CM | POA: Diagnosis not present

## 2021-03-15 HISTORY — PX: BOWEL RESECTION: SHX1257

## 2021-03-15 HISTORY — PX: APPLICATION OF WOUND VAC: SHX5189

## 2021-03-15 HISTORY — PX: LAPAROTOMY: SHX154

## 2021-03-15 LAB — CBC WITH DIFFERENTIAL/PLATELET
Abs Immature Granulocytes: 0 10*3/uL (ref 0.00–0.07)
Band Neutrophils: 12 %
Basophils Absolute: 0 10*3/uL (ref 0.0–0.1)
Basophils Relative: 0 %
Eosinophils Absolute: 0 10*3/uL (ref 0.0–0.5)
Eosinophils Relative: 0 %
HCT: 36.2 % — ABNORMAL LOW (ref 39.0–52.0)
Hemoglobin: 12.3 g/dL — ABNORMAL LOW (ref 13.0–17.0)
Lymphocytes Relative: 4 %
Lymphs Abs: 1.3 10*3/uL (ref 0.7–4.0)
MCH: 29.7 pg (ref 26.0–34.0)
MCHC: 34 g/dL (ref 30.0–36.0)
MCV: 87.4 fL (ref 80.0–100.0)
Monocytes Absolute: 1.3 10*3/uL — ABNORMAL HIGH (ref 0.1–1.0)
Monocytes Relative: 4 %
Neutro Abs: 29.4 10*3/uL — ABNORMAL HIGH (ref 1.7–7.7)
Neutrophils Relative %: 80 %
Platelets: 189 10*3/uL (ref 150–400)
RBC: 4.14 MIL/uL — ABNORMAL LOW (ref 4.22–5.81)
RDW: 20.6 % — ABNORMAL HIGH (ref 11.5–15.5)
WBC: 32 10*3/uL — ABNORMAL HIGH (ref 4.0–10.5)
nRBC: 0 /100 WBC
nRBC: 0.3 % — ABNORMAL HIGH (ref 0.0–0.2)

## 2021-03-15 LAB — CBC
HCT: 45.8 % (ref 39.0–52.0)
Hemoglobin: 16.1 g/dL (ref 13.0–17.0)
MCH: 30.1 pg (ref 26.0–34.0)
MCHC: 35.2 g/dL (ref 30.0–36.0)
MCV: 85.8 fL (ref 80.0–100.0)
Platelets: 222 10*3/uL (ref 150–400)
RBC: 5.34 MIL/uL (ref 4.22–5.81)
RDW: 20.6 % — ABNORMAL HIGH (ref 11.5–15.5)
WBC: 32.7 10*3/uL — ABNORMAL HIGH (ref 4.0–10.5)
nRBC: 0.4 % — ABNORMAL HIGH (ref 0.0–0.2)

## 2021-03-15 LAB — PHOSPHORUS: Phosphorus: 3.2 mg/dL (ref 2.5–4.6)

## 2021-03-15 LAB — COMPREHENSIVE METABOLIC PANEL
ALT: 22 U/L (ref 0–44)
AST: 65 U/L — ABNORMAL HIGH (ref 15–41)
Albumin: 1.6 g/dL — ABNORMAL LOW (ref 3.5–5.0)
Alkaline Phosphatase: 35 U/L — ABNORMAL LOW (ref 38–126)
Anion gap: 11 (ref 5–15)
BUN: 16 mg/dL (ref 8–23)
CO2: 20 mmol/L — ABNORMAL LOW (ref 22–32)
Calcium: 6.9 mg/dL — ABNORMAL LOW (ref 8.9–10.3)
Chloride: 107 mmol/L (ref 98–111)
Creatinine, Ser: 1.25 mg/dL — ABNORMAL HIGH (ref 0.61–1.24)
GFR, Estimated: >60 mL/min (ref >60)
Glucose, Bld: 237 mg/dL — ABNORMAL HIGH (ref 70–99)
Potassium: 3.7 mmol/L (ref 3.5–5.1)
Sodium: 138 mmol/L (ref 135–145)
Total Bilirubin: 1.3 mg/dL — ABNORMAL HIGH (ref 0.3–1.2)
Total Protein: 3.3 g/dL — ABNORMAL LOW (ref 6.5–8.1)

## 2021-03-15 LAB — ECHOCARDIOGRAM COMPLETE
Height: 61 in
S' Lateral: 2.9 cm
Weight: 1774.26 oz

## 2021-03-15 LAB — COOXEMETRY PANEL
Carboxyhemoglobin: 1.2 % (ref 0.5–1.5)
Methemoglobin: 1 % (ref 0.0–1.5)
O2 Saturation: 69.8 %
Total hemoglobin: 12.4 g/dL (ref 12.0–16.0)

## 2021-03-15 LAB — TRIGLYCERIDES: Triglycerides: 32 mg/dL (ref <150)

## 2021-03-15 LAB — MAGNESIUM: Magnesium: 2.8 mg/dL — ABNORMAL HIGH (ref 1.7–2.4)

## 2021-03-15 LAB — GLUCOSE, CAPILLARY
Glucose-Capillary: 144 mg/dL — ABNORMAL HIGH (ref 70–99)
Glucose-Capillary: 210 mg/dL — ABNORMAL HIGH (ref 70–99)

## 2021-03-15 LAB — PROTIME-INR
INR: 1.9 — ABNORMAL HIGH (ref 0.8–1.2)
Prothrombin Time: 22 seconds — ABNORMAL HIGH (ref 11.4–15.2)

## 2021-03-15 LAB — PREPARE RBC (CROSSMATCH)

## 2021-03-15 LAB — LACTIC ACID, PLASMA: Lactic Acid, Venous: 2.6 mmol/L (ref 0.5–1.9)

## 2021-03-15 SURGERY — LAPAROTOMY, EXPLORATORY
Anesthesia: General | Site: Abdomen

## 2021-03-15 MED ORDER — VASOPRESSIN 20 UNIT/ML IV SOLN
INTRAVENOUS | Status: AC
  Filled 2021-03-15: qty 1

## 2021-03-15 MED ORDER — PHENYLEPHRINE 40 MCG/ML (10ML) SYRINGE FOR IV PUSH (FOR BLOOD PRESSURE SUPPORT)
PREFILLED_SYRINGE | INTRAVENOUS | Status: DC | PRN
  Administered 2021-03-15 (×2): 40 ug via INTRAVENOUS

## 2021-03-15 MED ORDER — 0.9 % SODIUM CHLORIDE (POUR BTL) OPTIME
TOPICAL | Status: DC | PRN
  Administered 2021-03-15: 3000 mL
  Administered 2021-03-15: 4000 mL

## 2021-03-15 MED ORDER — PERFLUTREN LIPID MICROSPHERE
1.0000 mL | INTRAVENOUS | Status: AC | PRN
  Administered 2021-03-15: 4 mL via INTRAVENOUS
  Filled 2021-03-15: qty 10

## 2021-03-15 MED ORDER — ALBUMIN HUMAN 5 % IV SOLN
INTRAVENOUS | Status: AC
  Administered 2021-03-15: 50 g via INTRAVENOUS
  Filled 2021-03-15: qty 1000

## 2021-03-15 MED ORDER — ALBUMIN HUMAN 5 % IV SOLN
INTRAVENOUS | Status: AC
  Administered 2021-03-15: 25 g via INTRAVENOUS
  Filled 2021-03-15: qty 500

## 2021-03-15 MED ORDER — VECURONIUM BROMIDE 10 MG IV SOLR
10.0000 mg | Freq: Once | INTRAVENOUS | Status: DC
Start: 2021-03-15 — End: 2021-03-16

## 2021-03-15 MED ORDER — VASOPRESSIN 20 UNIT/ML IV SOLN
INTRAVENOUS | Status: DC | PRN
  Administered 2021-03-15 (×2): 1 [IU] via INTRAVENOUS

## 2021-03-15 MED ORDER — REVEFENACIN 175 MCG/3ML IN SOLN: 175.0000 ug | Freq: Every day | RESPIRATORY_TRACT | Status: DC

## 2021-03-15 MED ORDER — ALBUMIN HUMAN 5 % IV SOLN: 50.0000 g | Freq: Once | INTRAVENOUS | Status: AC

## 2021-03-15 MED ORDER — PANTOPRAZOLE SODIUM 40 MG IV SOLR: 40.0000 mg | Freq: Two times a day (BID) | INTRAVENOUS | Status: DC

## 2021-03-15 MED ORDER — POTASSIUM CHLORIDE 10 MEQ/50ML IV SOLN: 10.0000 meq | INTRAVENOUS | Status: DC

## 2021-03-15 MED ORDER — SODIUM CHLORIDE 0.9 % IV SOLN: 10.0000 mL/h | Freq: Once | INTRAVENOUS | Status: DC

## 2021-03-15 MED ORDER — INSULIN ASPART 100 UNIT/ML IJ SOLN
0.0000 [IU] | Freq: Four times a day (QID) | INTRAMUSCULAR | Status: DC
  Administered 2021-03-15: 1 [IU] via SUBCUTANEOUS
  Administered 2021-03-16: 2 [IU] via SUBCUTANEOUS
  Administered 2021-03-16: 1 [IU] via SUBCUTANEOUS
  Administered 2021-03-16: 2 [IU] via SUBCUTANEOUS
  Administered 2021-03-16: 3 [IU] via SUBCUTANEOUS
  Administered 2021-03-16: 2 [IU] via SUBCUTANEOUS
  Administered 2021-03-17 (×2): 1 [IU] via SUBCUTANEOUS

## 2021-03-15 MED ORDER — LACTATED RINGERS IV BOLUS
1000.0000 mL | Freq: Once | INTRAVENOUS | Status: AC
  Administered 2021-03-15: 1000 mL via INTRAVENOUS

## 2021-03-15 MED ORDER — ROCURONIUM BROMIDE 10 MG/ML (PF) SYRINGE
PREFILLED_SYRINGE | INTRAVENOUS | Status: AC
  Filled 2021-03-15: qty 10

## 2021-03-15 MED ORDER — PLASMA-LYTE A IV SOLN
6000.0000 mL | Freq: Once | INTRAVENOUS | Status: AC
  Administered 2021-03-15: 3000 mL via INTRAVENOUS

## 2021-03-15 MED ORDER — BUDESONIDE 0.25 MG/2ML IN SUSP
0.2500 mg | Freq: Two times a day (BID) | RESPIRATORY_TRACT | Status: DC
Start: 2021-03-15 — End: 2021-03-15

## 2021-03-15 MED ORDER — POTASSIUM CHLORIDE 10 MEQ/50ML IV SOLN: 10.0000 meq | INTRAVENOUS | Status: AC

## 2021-03-15 MED ORDER — ROCURONIUM BROMIDE 10 MG/ML (PF) SYRINGE
PREFILLED_SYRINGE | INTRAVENOUS | Status: DC | PRN
  Administered 2021-03-15: 50 mg via INTRAVENOUS

## 2021-03-15 MED ORDER — SURGIFLO WITH THROMBIN (HEMOSTATIC MATRIX KIT) OPTIME
TOPICAL | Status: DC | PRN
  Administered 2021-03-15: 1 via TOPICAL

## 2021-03-15 MED ORDER — PROPOFOL 500 MG/50ML IV EMUL
INTRAVENOUS | Status: DC | PRN
  Administered 2021-03-15: 50 ug/kg/min via INTRAVENOUS

## 2021-03-15 MED ORDER — THROMBIN (RECOMBINANT) 20000 UNITS EX SOLR
CUTANEOUS | Status: AC
  Filled 2021-03-15: qty 20000

## 2021-03-15 MED ORDER — TRAVASOL 10 % IV SOLN
INTRAVENOUS | Status: AC
  Filled 2021-03-15: qty 367.2

## 2021-03-15 MED ORDER — ALBUMIN HUMAN 5 % IV SOLN: 25.0000 g | Freq: Once | INTRAVENOUS | Status: AC

## 2021-03-15 MED ORDER — PLASMA-LYTE A IV SOLN: Freq: Once | INTRAVENOUS | Status: AC

## 2021-03-15 MED ORDER — LACTATED RINGERS IV SOLN: INTRAVENOUS | Status: DC | PRN

## 2021-03-15 MED ORDER — ASPIRIN 81 MG PO CHEW: 81.0000 mg | CHEWABLE_TABLET | Freq: Every day | ORAL | Status: DC

## 2021-03-15 MED ORDER — STERILE WATER FOR INJECTION IJ SOLN
30.0000 ng/kg/min | INTRAVENOUS | Status: DC
  Administered 2021-03-15 – 2021-03-16 (×4): 50 ng/kg/min via RESPIRATORY_TRACT
  Administered 2021-03-17 – 2021-03-18 (×2): 30 ng/kg/min via RESPIRATORY_TRACT
  Filled 2021-03-15 (×8): qty 5

## 2021-03-15 SURGICAL SUPPLY — 50 items
APL PRP STRL LF DISP 70% ISPRP (MISCELLANEOUS)
BLADE CLIPPER SURG (BLADE) IMPLANT
CANISTER SUCT 3000ML PPV (MISCELLANEOUS) ×1 IMPLANT
CANISTER WOUND CARE 500ML ATS (WOUND CARE) ×1 IMPLANT
CHLORAPREP W/TINT 26 (MISCELLANEOUS) ×1 IMPLANT
COVER SURGICAL LIGHT HANDLE (MISCELLANEOUS) ×2 IMPLANT
DRAPE LAPAROSCOPIC ABDOMINAL (DRAPES) ×2 IMPLANT
DRAPE UNIVERSAL (DRAPES) ×1 IMPLANT
DRAPE WARM FLUID 44X44 (DRAPES) ×2 IMPLANT
DRSG OPSITE POSTOP 4X10 (GAUZE/BANDAGES/DRESSINGS) IMPLANT
DRSG OPSITE POSTOP 4X8 (GAUZE/BANDAGES/DRESSINGS) IMPLANT
ELECT BLADE 6.5 EXT (BLADE) ×1 IMPLANT
ELECT CAUTERY BLADE 6.4 (BLADE) ×1 IMPLANT
ELECT REM PT RETURN 9FT ADLT (ELECTROSURGICAL) ×2
ELECTRODE REM PT RTRN 9FT ADLT (ELECTROSURGICAL) ×1 IMPLANT
GAUZE SPONGE 4X4 12PLY STRL (GAUZE/BANDAGES/DRESSINGS) ×2 IMPLANT
GLOVE SURG ENC MOIS LTX SZ6.5 (GLOVE) ×2 IMPLANT
GLOVE SURG UNDER POLY LF SZ6 (GLOVE) ×2 IMPLANT
GOWN STRL REUS W/ TWL LRG LVL3 (GOWN DISPOSABLE) ×2 IMPLANT
GOWN STRL REUS W/TWL LRG LVL3 (GOWN DISPOSABLE) ×6
HANDLE SUCTION POOLE (INSTRUMENTS) ×1 IMPLANT
HEMOSTAT POWDER KIT SURGIFOAM (HEMOSTASIS) IMPLANT
KIT BASIN OR (CUSTOM PROCEDURE TRAY) ×2 IMPLANT
KIT TURNOVER KIT B (KITS) ×2 IMPLANT
LIGASURE IMPACT 36 18CM CVD LR (INSTRUMENTS) ×1 IMPLANT
MARKER SKIN DUAL TIP RULER LAB (MISCELLANEOUS) ×1 IMPLANT
NS IRRIG 1000ML POUR BTL (IV SOLUTION) ×9 IMPLANT
PACK GENERAL/GYN (CUSTOM PROCEDURE TRAY) ×2 IMPLANT
PAD ARMBOARD 7.5X6 YLW CONV (MISCELLANEOUS) ×2 IMPLANT
PENCIL SMOKE EVACUATOR (MISCELLANEOUS) ×1 IMPLANT
RELOAD PROXIMATE 75MM BLUE (ENDOMECHANICALS) ×6 IMPLANT
RELOAD STAPLE 75 3.8 BLU REG (ENDOMECHANICALS) IMPLANT
SPONGE ABDOMINAL VAC ABTHERA (MISCELLANEOUS) ×1 IMPLANT
SPONGE T-LAP 18X18 ~~LOC~~+RFID (SPONGE) ×4 IMPLANT
STAPLER PROXIMATE 75MM BLUE (STAPLE) ×1 IMPLANT
STAPLER VISISTAT 35W (STAPLE) ×1 IMPLANT
SUCTION POOLE HANDLE (INSTRUMENTS) ×2
SUT CHROMIC 0 CT 1 (SUTURE) ×2 IMPLANT
SUT CHROMIC 0 SH (SUTURE) ×2 IMPLANT
SUT PDS AB 1 TP1 54 (SUTURE) IMPLANT
SUT PDS AB 1 TP1 96 (SUTURE) IMPLANT
SUT SILK 2 0 SH CR/8 (SUTURE) ×2 IMPLANT
SUT SILK 2 0 TIES 10X30 (SUTURE) ×2 IMPLANT
SUT SILK 3 0 SH CR/8 (SUTURE) ×2 IMPLANT
SUT SILK 3 0 TIES 10X30 (SUTURE) ×2 IMPLANT
SUT VIC AB 3-0 SH 18 (SUTURE) IMPLANT
TAPE CLOTH SOFT 2X10 (GAUZE/BANDAGES/DRESSINGS) ×2 IMPLANT
TOWEL GREEN STERILE (TOWEL DISPOSABLE) ×2 IMPLANT
TRAY FOLEY MTR SLVR 16FR STAT (SET/KITS/TRAYS/PACK) IMPLANT
YANKAUER SUCT BULB TIP NO VENT (SUCTIONS) ×1 IMPLANT

## 2021-03-15 NOTE — Progress Notes (Signed)
eLink Physician-Brief Progress Note Patient Name: Wayne [name] DOB: [birth year] MRN: [id number]   Date of Service  04/05/2021  HPI/Events of Note  Patient had reduction of a small bowel volvulus earlier today and patient has residual small bowel dysfunction with suspected ileus, and large volume output of serous fluid from in situ drain, hemoglobin has been stable but he has required progressively higher Norepinephrine gtt rates likely due to hypovolemic shock related the gut output.  eICU Interventions  Albumin 5 % 1000 ml iv bolus x 1, Plasmalyte 3 liters total iv bolus, KUB r/o ileus,  Wean Norepinephrine gtt once BP stabilizes to minimize additional ischemic injury to the small bowel, depending on KUB findings, patient may need a stat CT abdomen/ pelvis tonight. Labs ordered.        Thomasene Lot Berniece Abid 04/07/2021, 1:49 AM

## 2021-03-15 NOTE — Anesthesia Preprocedure Evaluation (Addendum)
Anesthesia Evaluation  Patient identified by MRN, date of birth, ID band Patient unresponsive    Reviewed: Allergy & Precautions, NPO status , Patient's Chart, lab work & pertinent test results  Airway Mallampati: Intubated       Dental   Pulmonary COPD, Current Smoker and Patient abstained from smoking.,  Intubated      + intubated    Cardiovascular hypertension, + CAD, + Past MI, + Peripheral Vascular Disease and +CHF  + Valvular Problems/Murmurs MR  Rhythm:Regular Rate:Tachycardia  Pulmonary hypertension    Neuro/Psych Sedated    GI/Hepatic GERD  Medicated,(+)     substance abuse  ,   Endo/Other  negative endocrine ROS  Renal/GU Renal disease     Musculoskeletal  (+) Arthritis ,   Abdominal   Peds  Hematology  (+) anemia ,   Anesthesia Other Findings small bowel obstruction  Reproductive/Obstetrics                            Anesthesia Physical Anesthesia Plan  ASA: 4 and emergent  Anesthesia Plan: General   Post-op Pain Management:    Induction: Intravenous  PONV Risk Score and Plan: 1 and Ondansetron, Dexamethasone, Treatment may vary due to age or medical condition and TIVA  Airway Management Planned: Oral ETT  Additional Equipment:   Intra-op Plan:   Post-operative Plan: Post-operative intubation/ventilation  Informed Consent:     History available from chart only  Plan Discussed with: Surgeon and CRNA  Anesthesia Plan Comments: (Left arterial line in place Left internal jugular vein central line in place )      Anesthesia Quick Evaluation

## 2021-03-15 NOTE — Progress Notes (Signed)
°  Progress Note    04/05/2021 10:19 AM 1 Day Post-Op  Subjective:  Called overnight with increasing pressor requirement and low UOP. Pt was bolused 5L plasmalyte due to being intravascularly depleted.  This morning, remains intubated, Levo significantly lower.   Vitals:   04/01/2021 0930 03/13/2021 0945  BP:    Pulse:  (!) 109  Resp: 18 18  Temp:    SpO2:  95%   Physical Exam: Cardiac: tachy Lungs: Intubated Incisions:  R groin with serosanguinous drainage Extremities:  feet warm, PT signals in the feet Abdomen:  tight, tender; thin drainage from abd incision Neurologic: A&O  CBC    Component Value Date/Time   WBC 32.0 (H) 03/09/2021 0521   RBC 4.14 (L) 03/24/2021 0521   HGB 12.3 (L) 03/28/2021 0521   HCT 36.2 (L) 04/02/2021 0521   PLT 189 03/26/2021 0521   MCV 87.4 04/05/2021 0521   MCH 29.7 03/19/2021 0521   MCHC 34.0 04/08/2021 0521   RDW 20.6 (H) 03/25/2021 0521   LYMPHSABS 1.3 03/19/2021 0521   MONOABS 1.3 (H) 03/10/2021 0521   EOSABS 0.0 03/17/2021 0521   BASOSABS 0.0 04/04/2021 0521    BMET    Component Value Date/Time   NA 138 03/26/2021 0113   K 3.7 03/17/2021 0113   CL 107 03/12/2021 0113   CO2 20 (L) 04/01/2021 0113   GLUCOSE 237 (H) 03/14/2021 0113   BUN 16 03/26/2021 0113   CREATININE 1.25 (H) 03/14/2021 0113   CALCIUM 6.9 (L) 03/14/2021 0113   GFRNONAA >60 03/14/2021 0113   GFRAA >60 06/21/2017 0305    INR    Component Value Date/Time   INR 1.9 (H) 04/08/2021 0113     Intake/Output Summary (Last 24 hours) at 03/21/2021 1019 Last data filed at 03/11/2021 0945 Gross per 24 hour  Intake 10547.84 ml  Output 6505 ml  Net 4042.84 ml      Assessment/Plan:  66 y.o. male s/p repair of R CFA pseudoaneurysm 1 Day Post-Op   Patient with volume depletion overnight, aggressively resuscitated with 5 L Plasma-Lyte.  Continues to have poor urine output.  Abdomen tight.  I am unsure as to whether his low urine output is due to AKI versus ATN or  abdominal compartment syndrome.  Patient's abdomen is currently open.  I discussed the above with Dr. Jamas Lav who recommended bladder pressure, as p individuals can still develop abdominal compartment syndrome with small midline laparotomy.  ________  I was called by Dr. Bobbye Morton with bladder pressure greater than 20 mmHg.  Plan will be to extend midline laparotomy for further decompression.  I will meet her in the OR.   Wayne [name] [facility] [phone] 03/31/2021 10:19 AM

## 2021-03-15 NOTE — Progress Notes (Signed)
Bladder pressure measurement of 25. In the setting of new anuria, c/w abdominal compartment syndrome. Plan for re-exlap. Called sister and niece at numbers listed in the chart, no answer. Left VM for both. Proceed with surgery under emergent indications. D/w Dr. Virl Cagey of the primary service.  Jesusita Oka, MD General and [place] [facility]

## 2021-03-15 NOTE — Progress Notes (Signed)
General Surgery Follow Up Note  Subjective:    Overnight Issues:   Objective:  Vital signs for last 24 hours: Temp:  [97 F (36.1 C)-100.6 F (38.1 C)] 98.4 F (36.9 C) (01/07 0800) Pulse Rate:  [68-145] 106 (01/07 0855) Resp:  [8-26] 22 (01/07 0855) BP: (72-133)/(56-86) 133/59 (01/07 0800) SpO2:  [85 %-100 %] 95 % (01/07 0855) Arterial Line BP: (69-159)/(50-76) 118/54 (01/07 0815) FiO2 (%):  [60 %-100 %] 60 % (01/07 0855)  Hemodynamic parameters for last 24 hours: CVP:  [6 mmHg-63 mmHg] 19 mmHg  Intake/Output from previous day: 01/06 0701 - 01/07 0700 In: 10547.8 [I.V.:6439; Blood:1260; IV Piggyback:2848.8] Out: 9476 [Urine:1180; Emesis/NG output:850; Drains:2600; Blood:25]  Intake/Output this shift: No intake/output data recorded.  Vent settings for last 24 hours: Vent Mode: PRVC FiO2 (%):  [60 %-100 %] 60 % Set Rate:  [20 bmp-22 bmp] 20 bmp Vt Set:  [420 mL] 420 mL PEEP:  [10 cmH20] 10 cmH20 Plateau Pressure:  [20 cmH20-35 cmH20] 27 cmH20  Physical Exam:  Gen: mildly uncomfortable, no distress Neuro: non-focal exam HEENT: PERRL Neck: supple CV: 7 of levo 2.5 of dobutamine Pulm: unlabored breathing Abd: firm, tender, abthera in place GU: anuric, significantly dropped o/n Extr: wwp, no edema   Results for orders placed or performed during the hospital encounter of 03/07/2021 (from the past 24 hour(s))  I-STAT 7, (LYTES, BLD GAS, ICA, H+H)     Status: Abnormal   Collection Time: 04/05/2021 10:44 AM  Result Value Ref Range   pH, Arterial 7.283 (L) 7.350 - 7.450   pCO2 arterial 40.6 32.0 - 48.0 mmHg   pO2, Arterial 112 (H) 83.0 - 108.0 mmHg   Bicarbonate 19.6 (L) 20.0 - 28.0 mmol/L   TCO2 21 (L) 22 - 32 mmol/L   O2 Saturation 98.0 %   Acid-base deficit 7.0 (H) 0.0 - 2.0 mmol/L   Sodium 145 135 - 145 mmol/L   Potassium 2.8 (L) 3.5 - 5.1 mmol/L   Calcium, Ion 1.15 1.15 - 1.40 mmol/L   HCT 17.0 (L) 39.0 - 52.0 %   Hemoglobin 5.8 (LL) 13.0 - 17.0 g/dL    Patient temperature 35.4 C    Sample type ARTERIAL   Prepare RBC (crossmatch)     Status: None   Collection Time: 03/12/2021 10:53 AM  Result Value Ref Range   Order Confirmation      ORDER PROCESSED BY BLOOD BANK Performed at [facility], [address] [place], [place] [address]   Lactic acid, plasma     Status: Abnormal   Collection Time: 04/06/2021 12:13 PM  Result Value Ref Range   Lactic Acid, Venous 5.4 (HH) 0.5 - 1.9 mmol/L  CBC     Status: Abnormal   Collection Time: 04/03/2021 12:13 PM  Result Value Ref Range   WBC 36.3 (H) 4.0 - 10.5 K/uL   RBC 5.05 4.22 - 5.81 MIL/uL   Hemoglobin 15.2 13.0 - 17.0 g/dL   HCT 45.1 39.0 - 52.0 %   MCV 89.3 80.0 - 100.0 fL   MCH 30.1 26.0 - 34.0 pg   MCHC 33.7 30.0 - 36.0 g/dL   RDW 18.5 (H) 11.5 - 15.5 %   Platelets 199 150 - 400 K/uL   nRBC 0.1 0.0 - 0.2 %  Basic metabolic panel     Status: Abnormal   Collection Time: 04/02/2021 12:13 PM  Result Value Ref Range   Sodium 143 135 - 145 mmol/L   Potassium 3.8 3.5 -  5.1 mmol/L   Chloride 110 98 - 111 mmol/L   CO2 24 22 - 32 mmol/L   Glucose, Bld 163 (H) 70 - 99 mg/dL   BUN 11 8 - 23 mg/dL   Creatinine, Ser 0.89 0.61 - 1.24 mg/dL   Calcium 8.4 (L) 8.9 - 10.3 mg/dL   GFR, Estimated >60 >60 mL/min   Anion gap 9 5 - 15  Magnesium     Status: Abnormal   Collection Time: 03/24/2021 12:13 PM  Result Value Ref Range   Magnesium 2.8 (H) 1.7 - 2.4 mg/dL  I-STAT 7, (LYTES, BLD GAS, ICA, H+H)     Status: Abnormal   Collection Time: 04/04/2021 12:16 PM  Result Value Ref Range   pH, Arterial 7.254 (L) 7.350 - 7.450   pCO2 arterial 53.9 (H) 32.0 - 48.0 mmHg   pO2, Arterial 72 (L) 83.0 - 108.0 mmHg   Bicarbonate 24.3 20.0 - 28.0 mmol/L   TCO2 26 22 - 32 mmol/L   O2 Saturation 93.0 %   Acid-base deficit 4.0 (H) 0.0 - 2.0 mmol/L   Sodium 146 (H) 135 - 145 mmol/L   Potassium 3.8 3.5 - 5.1 mmol/L   Calcium, Ion 1.26 1.15 - 1.40 mmol/L   HCT 42.0 39.0 - 52.0 %   Hemoglobin 14.3 13.0 - 17.0 g/dL    Patient temperature 96.1 F    Collection site RADIAL, ALLEN'S TEST ACCEPTABLE    Drawn by RT    Sample type ARTERIAL   Glucose, capillary     Status: Abnormal   Collection Time: 03/22/2021 12:18 PM  Result Value Ref Range   Glucose-Capillary 140 (H) 70 - 99 mg/dL  I-STAT 7, (LYTES, BLD GAS, ICA, H+H)     Status: Abnormal   Collection Time: 04/03/2021  1:29 PM  Result Value Ref Range   pH, Arterial 7.305 (L) 7.350 - 7.450   pCO2 arterial 49.7 (H) 32.0 - 48.0 mmHg   pO2, Arterial 62 (L) 83.0 - 108.0 mmHg   Bicarbonate 24.7 20.0 - 28.0 mmol/L   TCO2 26 22 - 32 mmol/L   O2 Saturation 89.0 %   Acid-base deficit 2.0 0.0 - 2.0 mmol/L   Sodium 144 135 - 145 mmol/L   Potassium 3.6 3.5 - 5.1 mmol/L   Calcium, Ion 1.26 1.15 - 1.40 mmol/L   HCT 50.0 39.0 - 52.0 %   Hemoglobin 17.0 13.0 - 17.0 g/dL   Collection site RADIAL, ALLEN'S TEST ACCEPTABLE    Drawn by RT    Sample type ARTERIAL   MRSA Next Gen by PCR, Nasal     Status: None   Collection Time: 03/23/2021  2:11 PM   Specimen: Nasal Mucosa; Nasal Swab  Result Value Ref Range   MRSA by PCR Next Gen NOT DETECTED NOT DETECTED  Lactic acid, plasma     Status: Abnormal   Collection Time: 03/13/2021  5:58 PM  Result Value Ref Range   Lactic Acid, Venous 2.8 (HH) 0.5 - 1.9 mmol/L  I-STAT 7, (LYTES, BLD GAS, ICA, H+H)     Status: Abnormal   Collection Time: 03/30/2021  6:58 PM  Result Value Ref Range   pH, Arterial 7.386 7.350 - 7.450   pCO2 arterial 38.8 32.0 - 48.0 mmHg   pO2, Arterial 111 (H) 83.0 - 108.0 mmHg   Bicarbonate 23.4 20.0 - 28.0 mmol/L   TCO2 25 22 - 32 mmol/L   O2 Saturation 98.0 %   Acid-base deficit 2.0 0.0 - 2.0 mmol/L  Sodium 142 135 - 145 mmol/L   Potassium 3.7 3.5 - 5.1 mmol/L   Calcium, Ion 1.19 1.15 - 1.40 mmol/L   HCT 50.0 39.0 - 52.0 %   Hemoglobin 17.0 13.0 - 17.0 g/dL   Patient temperature 36.8 C    Collection site RADIAL, ALLEN'S TEST ACCEPTABLE    Drawn by RT    Sample type ARTERIAL   CBC     Status:  Abnormal   Collection Time: 04/04/2021  9:13 PM  Result Value Ref Range   WBC 35.8 (H) 4.0 - 10.5 K/uL   RBC 5.76 4.22 - 5.81 MIL/uL   Hemoglobin 17.6 (H) 13.0 - 17.0 g/dL   HCT 49.1 39.0 - 52.0 %   MCV 85.2 80.0 - 100.0 fL   MCH 30.6 26.0 - 34.0 pg   MCHC 35.8 30.0 - 36.0 g/dL   RDW 20.6 (H) 11.5 - 15.5 %   Platelets 229 150 - 400 K/uL   nRBC 0.2 0.0 - 0.2 %  I-STAT 7, (LYTES, BLD GAS, ICA, H+H)     Status: Abnormal   Collection Time: 03/29/2021 11:35 PM  Result Value Ref Range   pH, Arterial 7.432 7.350 - 7.450   pCO2 arterial 35.4 32.0 - 48.0 mmHg   pO2, Arterial 240 (H) 83.0 - 108.0 mmHg   Bicarbonate 23.6 20.0 - 28.0 mmol/L   TCO2 25 22 - 32 mmol/L   O2 Saturation 100.0 %   Acid-Base Excess 0.0 0.0 - 2.0 mmol/L   Sodium 140 135 - 145 mmol/L   Potassium 3.7 3.5 - 5.1 mmol/L   Calcium, Ion 1.12 (L) 1.15 - 1.40 mmol/L   HCT 48.0 39.0 - 52.0 %   Hemoglobin 16.3 13.0 - 17.0 g/dL   Patient temperature 37.1 C    Collection site art line    Drawn by Operator    Sample type ARTERIAL   Lactic acid, plasma     Status: Abnormal   Collection Time: 03/09/2021  1:10 AM  Result Value Ref Range   Lactic Acid, Venous 2.6 (HH) 0.5 - 1.9 mmol/L  Comprehensive metabolic panel     Status: Abnormal   Collection Time: 04/03/2021  1:13 AM  Result Value Ref Range   Sodium 138 135 - 145 mmol/L   Potassium 3.7 3.5 - 5.1 mmol/L   Chloride 107 98 - 111 mmol/L   CO2 20 (L) 22 - 32 mmol/L   Glucose, Bld 237 (H) 70 - 99 mg/dL   BUN 16 8 - 23 mg/dL   Creatinine, Ser 1.25 (H) 0.61 - 1.24 mg/dL   Calcium 6.9 (L) 8.9 - 10.3 mg/dL   Total Protein 3.3 (L) 6.5 - 8.1 g/dL   Albumin 1.6 (L) 3.5 - 5.0 g/dL   AST 65 (H) 15 - 41 U/L   ALT 22 0 - 44 U/L   Alkaline Phosphatase 35 (L) 38 - 126 U/L   Total Bilirubin 1.3 (H) 0.3 - 1.2 mg/dL   GFR, Estimated >60 >60 mL/min   Anion gap 11 5 - 15  CBC     Status: Abnormal   Collection Time: 03/22/2021  1:13 AM  Result Value Ref Range   WBC 32.7 (H) 4.0 - 10.5 K/uL    RBC 5.34 4.22 - 5.81 MIL/uL   Hemoglobin 16.1 13.0 - 17.0 g/dL   HCT 45.8 39.0 - 52.0 %   MCV 85.8 80.0 - 100.0 fL   MCH 30.1 26.0 - 34.0 pg   MCHC 35.2 30.0 - 36.0  g/dL   RDW 20.6 (H) 11.5 - 15.5 %   Platelets 222 150 - 400 K/uL   nRBC 0.4 (H) 0.0 - 0.2 %  Protime-INR     Status: Abnormal   Collection Time: 03/13/2021  1:13 AM  Result Value Ref Range   Prothrombin Time 22.0 (H) 11.4 - 15.2 seconds   INR 1.9 (H) 0.8 - 1.2  CBC with Differential/Platelet     Status: Abnormal   Collection Time: 03/20/2021  5:21 AM  Result Value Ref Range   WBC 32.0 (H) 4.0 - 10.5 K/uL   RBC 4.14 (L) 4.22 - 5.81 MIL/uL   Hemoglobin 12.3 (L) 13.0 - 17.0 g/dL   HCT 36.2 (L) 39.0 - 52.0 %   MCV 87.4 80.0 - 100.0 fL   MCH 29.7 26.0 - 34.0 pg   MCHC 34.0 30.0 - 36.0 g/dL   RDW 20.6 (H) 11.5 - 15.5 %   Platelets 189 150 - 400 K/uL   nRBC 0.3 (H) 0.0 - 0.2 %   Neutrophils Relative % 80 %   Neutro Abs 29.4 (H) 1.7 - 7.7 K/uL   Band Neutrophils 12 %   Lymphocytes Relative 4 %   Lymphs Abs 1.3 0.7 - 4.0 K/uL   Monocytes Relative 4 %   Monocytes Absolute 1.3 (H) 0.1 - 1.0 K/uL   Eosinophils Relative 0 %   Eosinophils Absolute 0.0 0.0 - 0.5 K/uL   Basophils Relative 0 %   Basophils Absolute 0.0 0.0 - 0.1 K/uL   nRBC 0 0 /100 WBC   Abs Immature Granulocytes 0.00 0.00 - 0.07 K/uL  Magnesium     Status: Abnormal   Collection Time: 04/07/2021  5:21 AM  Result Value Ref Range   Magnesium 2.8 (H) 1.7 - 2.4 mg/dL  Phosphorus     Status: None   Collection Time: 03/25/2021  5:21 AM  Result Value Ref Range   Phosphorus 3.2 2.5 - 4.6 mg/dL  Triglycerides     Status: None   Collection Time: 04/05/2021  5:21 AM  Result Value Ref Range   Triglycerides 32 <150 mg/dL  .Cooxemetry Panel (carboxy, met, total hgb, O2 sat)     Status: None   Collection Time: 03/09/2021  8:54 AM  Result Value Ref Range   Total hemoglobin 12.4 12.0 - 16.0 g/dL   O2 Saturation 69.8 %   Carboxyhemoglobin 1.2 0.5 - 1.5 %   Methemoglobin 1.0  0.0 - 1.5 %    Assessment & Plan: The plan of care was discussed with the bedside nurse for the day, who is in agreement with this plan and no additional concerns were raised.   Present on Admission:  Femoral artery aneurysm (HCC)  Ischemia of extremity    LOS: 16 days   Additional comments:I reviewed the patient's new clinical lab test results.   and I reviewed the patients new imaging test results.    S/p repair of right aortobifemoral graft pseudoaneurysm 02/07/2021 Dr. Donzetta Matters POD 7, S/p diagnostic laparoscopy 02/23/2021 Dr. Rosendo Gros - negative for ischemic bowel Abdominal pain - s/p exlap, reduction of small bowel volvulus, placement of abdominal wound vac by Dr. Kae Heller 1/6. Appreciate aggressive volume resuscitation o/n and decreasing UOP over the course of the night. Will check bladder pressure to assess for ACS, otherwise plan for re-exploration 1/8. Discussed with CCM request for bladder pressure measurement at 0802.  ID - currently augmentin/ doxycycline 1/1>> stopped augmentin 1/6, zosyn 1/6 --> FEN - NPO, IVFs VTE - sq heparin  Foley - just placed, 350cc return   HTN HLD GERD COPD CHF Pulmonary HTN Hx alcohol abuse Hx PUD Acute respiratory failure - on 6L [place] Lactic acidosis Leukocytosis Tachycardia   Wayne Oka, MD Trauma & General Surgery Please use [email or web address] to contact on call provider  03/26/2021  *Care during the described time interval was provided by me. I have reviewed this patient's available data, including medical history, events of note, physical examination and test results as part of my evaluation.

## 2021-03-15 NOTE — Progress Notes (Signed)
[facility] Progress Note Patient Name: Wayne [name] DOB: [birth year] MRN: [id number]   Date of Service  04/05/2021  HPI/Events of Note  Patient with over all improved hemodynamics since he received albumin bolus, pressor requirement essentially cut in half, patient remains oliguric , and blood pressure softening a bit, although MAP remains above 65 mmHg.  eICU Interventions  Will give an additional 500 ml of 5 % albumin iv now, will defer any further imaging as he seems to be stabilizing hemodynamically.        Cyndel Griffey U Ryman Rathgeber 03/13/2021, 3:20 AM

## 2021-03-15 NOTE — Transfer of Care (Signed)
Immediate Anesthesia Transfer of Care Note  Patient: Wayne [name]  Procedure(s) Performed: EXPLORATORY LAPAROTOMY (Abdomen) SMALL BOWEL RESECTION (Abdomen) APPLICATION OF WOUND VAC (Abdomen)  Patient Location: ICU  Anesthesia Type:General  Level of Consciousness: Patient remains intubated per anesthesia plan  Airway & Oxygen Therapy: Patient remains intubated per anesthesia plan and Patient placed on Ventilator (see vital sign flow sheet for setting)  Post-op Assessment: Report given to RN and Post -op Vital signs reviewed and stable  Post vital signs: Reviewed and stable  Last Vitals:  Vitals Value Taken Time  BP 117/53 03/16/2021 1256  Temp    Pulse 104 03/13/2021 1256  Resp 20 03/22/2021 1256  SpO2 100 % 03/22/2021 1256    Last Pain:  Vitals:   03/29/2021 0800  TempSrc: Esophageal  PainSc:       Patients Stated Pain Goal: 1 (03/09/21 0407)  Complications: No notable events documented.

## 2021-03-15 NOTE — Progress Notes (Signed)
Patient requiring increasing pressor needs, minimal UOP over past 3 hours, only 30 mL in bladder when scanned, pedal pulses absent but femoral pulses still dopplerable, wound vac has put out 400 mL since 2000. Paged Vascular on call, Karin Lieu MD regarding patient status. Received verbal order to bolus fluids and obtain labs. Elink made aware of patient status, see new orders. Care continues.

## 2021-03-15 NOTE — Consult Note (Signed)
NAME:  Wayne [name], MRN:  [id number], DOB:  October 13, 1955, LOS: 16 ADMISSION DATE:  02/21/2021, CONSULTATION DATE: 04/05/2021 REFERRING MD: [facility], CHIEF COMPLAINT: Sepsis small bowel ischemia obstruction  History of Present Illness:  65 year old male is originally admitted 14 days prior to this note with right femoral pseudoaneurysm repair per vascular surgery.  He has had a rocky course over the last 2 weeks and on 04-05-2021 during the night and increasing white count, hypotension, fever and generalized failure to thrive.  Surgery was called to the bedside and elected to take him urgently to the operating room for open laparotomy and found small bowel ischemia bulbous with repair.  He is returning from the OR with open abdomen with wound VAC in place.  Unfortunately he aspirated during intubation and is on full mechanical ventilatory support at this time.  He is not requiring vasopressor support at this time but his prognosis is extremely guarded.  Pulmonary critical care has been asked to assist in his care.  Notably he received epinephrine in the operating room along with 4 units of packed red blood cells.  Pertinent  Medical History   Past Medical History:  Diagnosis Date   Abnormal pulmonary finding    a. preadmission note indicates pt followed by Dr. Bethanie Dicker for CHF, lung mass, COPD, pulmonary mycosis with plan for 3 mo f/u with CT by 03/2017 note.   Alcohol abuse    Anemia    Arthritis    "right hip; lower back" (05/11/2017)   Chronic systolic CHF (congestive heart failure) (HCC)    a. EF 20-25% by echo 2017. b. 40-45% by echo in 01/2017.   COPD (chronic obstructive pulmonary disease) (HCC)    Dyspnea    GERD (gastroesophageal reflux disease)    History of blood transfusion 2004   "related to bleeding stomach ulcerts"   History of stomach ulcers 2004   Hypercholesterolemia    Hypertension    Malnutrition (HCC)    Moderate mitral regurgitation    Myocardial infarction [facility])  2004   "think it was bleeding ulcers; not a heart attack" (05/11/2017), pt denies heart cath   Peripheral vascular disease (HCC)    Pre-diabetes    Pulmonary hypertension (HCC)    a. severe pulm HTN by echo 01/2017.   Tricuspid regurgitation 2017    Significant Hospital Events: Including procedures, antibiotic start and stop dates in addition to other pertinent events   05-Apr-2021 to operating room for open laparotomy 1/6-aggressive fluid resuscitation for ongoing shock.  Started on epoprostenol and dobutamine for RV dysfunction on point-of-care echo   Interim History / Subjective:  Tachycardia has improved on low-dose dobutamine and norepinephrine.  Patient will awake and appears uncomfortable  Objective   Blood pressure 92/65, pulse (!) 107, temperature 98.4 F (36.9 C), temperature source Esophageal, resp. rate 18, height 5\' 1"  (1.549 m), weight 50.3 kg, SpO2 97 %. CVP:  [6 mmHg-63 mmHg] 23 mmHg  Vent Mode: PRVC FiO2 (%):  [60 %-100 %] 100 % Set Rate:  [20 bmp-22 bmp] 20 bmp Vt Set:  [420 mL] 420 mL PEEP:  [10 cmH20] 10 cmH20 Plateau Pressure:  [20 cmH20-35 cmH20] 27 cmH20   Intake/Output Summary (Last 24 hours) at 03/24/2021 1152 Last data filed at 03/17/2021 1100 Gross per 24 hour  Intake 8154.32 ml  Output 5030 ml  Net 3124.32 ml    Filed Weights   02/26/2021 1624 03/03/21 0407 2021/04/05 0100  Weight: 45.4 kg 43.8 kg 50.3 kg  Examination: General: Frail cachectic male, intubated on fentanyl, norepinephrine, dobutamine HENT: OG-tube, ET tube in place, continues to have bloody gastric effluent Lungs: Clear throughout.  Acceptable airway pressures Cardiovascular: Heart sounds are distant extremities are warm. Abdomen: Wound VAC in place abdomen is tender and taut.  Bladder pressure 25 Extremities: Increasing bilateral leg edema.  Oozing from right groin incision site. Neuro: On fentanyl alone.  Will open eyes and wince to pain.  Ancillary tests personally reviewed:  SCV  O2 69.8 Creatinine 1.2 Leukocytosis persists 32.0 Hemoglobin 12.3 Echocardiogram personally reviewed showing severe RV dysfunction.  Low normal LVEF. Assessment & Plan:   Critically ill due to mixed distributive and cardiogenic shock from bowel ischemia due to small bowel volvulus and pre-existing RV failure from severe pulmonary hypertension, requiring titration of norepinephrine, dobutamine and epoprostenol. Status post small bowel obstruction due to small bowel volvulus Status post repair of femoral pseudoaneurysm Peripheral vascular disease status post aortobifemoral bypass Severe malnutrition Possible abdominal compartment syndrome AKI  Plan:   -Continue full ventilatory support with lung protective strategy -Urgent laparotomy for complete apartment syndrome -Continue current hemodynamic support.  Patient appears intravascularly replete by CVP. -Patient to start on TPN today -Decrease IV fluids -Phase 1 glycemic protocol, anticipate hyperglycemia with TPN.   Best Practice (right click and "Reselect all SmartList Selections" daily)   Diet/type: NPO started TPN DVT prophylaxis: not indicated start heparin 3 times daily GI prophylaxis: PPI Lines: Central line and Arterial Line Foley:  Yes, and it is still needed Code Status:  full code Last date of multidisciplinary goals of care discussion [tbd]  CRITICAL CARE Performed by: Lynnell Catalan   Total critical care time: 50 minutes  Critical care time was exclusive of separately billable procedures and treating other patients.  Critical care was necessary to treat or prevent imminent or life-threatening deterioration.  Critical care was time spent personally by me on the following activities: development of treatment plan with patient and/or surrogate as well as nursing, discussions with consultants, evaluation of patient's response to treatment, examination of patient, obtaining history from patient or surrogate, ordering  and performing treatments and interventions, ordering and review of laboratory studies, ordering and review of radiographic studies, pulse oximetry, re-evaluation of patient's condition and participation in multidisciplinary rounds.  Lynnell Catalan, MD [facility] ICU Physician [facility] Critical Care  Pager: [phone] Mobile: [phone] After hours: [phone].  March 25, 2021, 11:52 AM

## 2021-03-15 NOTE — Progress Notes (Signed)
Echocardiogram 2D Echocardiogram has been performed.  Gerda Diss 2021-04-10, 9:54 AM

## 2021-03-15 NOTE — Progress Notes (Signed)
Bladder pressure measurement of 25-27 mmHg. Notified MD.

## 2021-03-15 NOTE — Op Note (Signed)
° °  Operative Note   Date: 03/17/21  Procedure: exploratory laparotomy, subtotal small bowel resection, hepatorrhaphy  Pre-op diagnosis: abdominal compartment syndrome Post-op diagnosis: abdominal compartment syndrome, diffuse patchy necrosis of nearly the entire small bowel, patchy ischemia of the sigmoid colon, liver injury  Indication and clinical history: The patient is a 66 y.o. year old male with history of small bowel volvulus s/p exlap and reduction, now with clinical signs of abdominal compartment syndrome.      Surgeon: Diamantina Monks, MD Assistant: Sheliah Hatch, MD  Anesthesiologist: Bradley Ferris, MD Anesthesia: General  Findings:  Specimen: small bowel-jejunum, ileum EBL: 100cc Drains/Implants: abthera, floseal  Disposition: ICU - intubated and critically ill.  Description of procedure: The patient was positioned supine on the operating room table. General anesthetic induction and intubation were uneventful. Time-out was performed verifying correct patient, procedure, and administration of pre-operative antibiotics. Informed consent was not obtained due to inability to reach family and the procedure performed under emergent consent. The abdomen was prepped and draped in the usual sterile fashion.  After removal of the inner abthera layer, the midline incision was extended inferiorly and superiorly. During superior extension, there was an unavoidable liver injury due to adhesion of the liver to  the anterior abdominal wall. The small bowel was eviscerated and had extensive patchy necrosis throughout. The areas of obvious necrosis were resected, leaving approximately 90cm of remaining proximal small bowel that was left in discontinuity. The remaining small bowel also had patchy areas of ischemia, however not frankly necrotic, although I suspect some of this will progress to necrosis. The abdomen was irrigated and inspected and there was an area of patchy ischemia of the sigmoid colon  noted. The liver injury was cauterized, but still had some oozing, so floseal was applied and suture ligation was performed using three figure of eight chromic sutures. An abthera was replaced as sterile dressing.   All sponge and instrument counts were correct at the conclusion of the procedure. The patient was transported to the ICU in critical, but stable condition. There were no complications. The   Upon entering the abdomen (organ space), I encountered extensive small bowel necrosis.  CASE DATA:  Type of patient?: DOW CASE (Surgical Hospitalist [facility])  Status of Case? EMERGENT Add On  Infection Present At Time Of Surgery (PATOS)?   Yes, small bowel necrosis    Diamantina Monks, MD General and Trauma Surgery [facility] Surgery

## 2021-03-15 NOTE — Anesthesia Postprocedure Evaluation (Signed)
Anesthesia Post Note  Patient: Wayne [name]  Procedure(s) Performed: EXPLORATORY LAPAROTOMY (Abdomen) SMALL BOWEL RESECTION (Abdomen) APPLICATION OF WOUND VAC (Abdomen)     Patient location during evaluation: SICU Anesthesia Type: General Level of consciousness: sedated Pain management: pain level controlled Vital Signs Assessment: post-procedure vital signs reviewed and stable Respiratory status: patient remains intubated per anesthesia plan Cardiovascular status: stable Postop Assessment: no apparent nausea or vomiting Anesthetic complications: no   No notable events documented.  Last Vitals:  Vitals:   03/28/2021 1751 03/27/2021 1800  BP:  (!) 142/67  Pulse:  96  Resp:  20  Temp:    SpO2: 100% 100%    Last Pain:  Vitals:   04/01/2021 0800  TempSrc: Esophageal  PainSc:                  Catheryn Bacon Sequoyah Counterman

## 2021-03-15 NOTE — Progress Notes (Signed)
Patient transported on vent and Veletri from 2H22 to OR without complications.  Following OR procedure, patient then transported on vent and Veletri from OR2 to 4N24 without complications.  Report given to 4N ICU RT.

## 2021-03-16 ENCOUNTER — Encounter (HOSPITAL_COMMUNITY): Payer: Self-pay | Admitting: Surgery

## 2021-03-16 DIAGNOSIS — Z515 Encounter for palliative care: Secondary | ICD-10-CM

## 2021-03-16 DIAGNOSIS — E43 Unspecified severe protein-calorie malnutrition: Secondary | ICD-10-CM

## 2021-03-16 DIAGNOSIS — J9601 Acute respiratory failure with hypoxia: Secondary | ICD-10-CM

## 2021-03-16 DIAGNOSIS — R0902 Hypoxemia: Secondary | ICD-10-CM

## 2021-03-16 DIAGNOSIS — Z7189 Other specified counseling: Secondary | ICD-10-CM

## 2021-03-16 DIAGNOSIS — R579 Shock, unspecified: Secondary | ICD-10-CM

## 2021-03-16 DIAGNOSIS — R0603 Acute respiratory distress: Secondary | ICD-10-CM

## 2021-03-16 DIAGNOSIS — R1084 Generalized abdominal pain: Secondary | ICD-10-CM

## 2021-03-16 DIAGNOSIS — Z9049 Acquired absence of other specified parts of digestive tract: Secondary | ICD-10-CM

## 2021-03-16 DIAGNOSIS — K559 Vascular disorder of intestine, unspecified: Secondary | ICD-10-CM

## 2021-03-16 LAB — CBC
HCT: 33.2 % — ABNORMAL LOW (ref 39.0–52.0)
Hemoglobin: 11.2 g/dL — ABNORMAL LOW (ref 13.0–17.0)
MCH: 30 pg (ref 26.0–34.0)
MCHC: 33.7 g/dL (ref 30.0–36.0)
MCV: 89 fL (ref 80.0–100.0)
Platelets: 177 10*3/uL (ref 150–400)
RBC: 3.73 MIL/uL — ABNORMAL LOW (ref 4.22–5.81)
RDW: 20.6 % — ABNORMAL HIGH (ref 11.5–15.5)
WBC: 40.4 10*3/uL — ABNORMAL HIGH (ref 4.0–10.5)
nRBC: 0.3 % — ABNORMAL HIGH (ref 0.0–0.2)

## 2021-03-16 LAB — COMPREHENSIVE METABOLIC PANEL
ALT: 49 U/L — ABNORMAL HIGH (ref 0–44)
AST: 130 U/L — ABNORMAL HIGH (ref 15–41)
Albumin: 1.8 g/dL — ABNORMAL LOW (ref 3.5–5.0)
Alkaline Phosphatase: 65 U/L (ref 38–126)
Anion gap: 7 (ref 5–15)
BUN: 23 mg/dL (ref 8–23)
CO2: 23 mmol/L (ref 22–32)
Calcium: 7.1 mg/dL — ABNORMAL LOW (ref 8.9–10.3)
Chloride: 101 mmol/L (ref 98–111)
Creatinine, Ser: 2.13 mg/dL — ABNORMAL HIGH (ref 0.61–1.24)
GFR, Estimated: 33 mL/min — ABNORMAL LOW (ref >60)
Glucose, Bld: 186 mg/dL — ABNORMAL HIGH (ref 70–99)
Potassium: 3.8 mmol/L (ref 3.5–5.1)
Sodium: 131 mmol/L — ABNORMAL LOW (ref 135–145)
Total Bilirubin: 1.4 mg/dL — ABNORMAL HIGH (ref 0.3–1.2)
Total Protein: 3.7 g/dL — ABNORMAL LOW (ref 6.5–8.1)

## 2021-03-16 LAB — POCT I-STAT 7, (LYTES, BLD GAS, ICA,H+H)
Acid-base deficit: 5 mmol/L — ABNORMAL HIGH (ref 0.0–2.0)
Bicarbonate: 22.3 mmol/L (ref 20.0–28.0)
Calcium, Ion: 1.01 mmol/L — ABNORMAL LOW (ref 1.15–1.40)
HCT: 28 % — ABNORMAL LOW (ref 39.0–52.0)
Hemoglobin: 9.5 g/dL — ABNORMAL LOW (ref 13.0–17.0)
O2 Saturation: 94 %
Potassium: 3.5 mmol/L (ref 3.5–5.1)
Sodium: 134 mmol/L — ABNORMAL LOW (ref 135–145)
TCO2: 24 mmol/L (ref 22–32)
pCO2 arterial: 49.3 mmHg — ABNORMAL HIGH (ref 32.0–48.0)
pH, Arterial: 7.263 — ABNORMAL LOW (ref 7.350–7.450)
pO2, Arterial: 82 mmHg — ABNORMAL LOW (ref 83.0–108.0)

## 2021-03-16 LAB — GLUCOSE, CAPILLARY
Glucose-Capillary: 127 mg/dL — ABNORMAL HIGH (ref 70–99)
Glucose-Capillary: 151 mg/dL — ABNORMAL HIGH (ref 70–99)
Glucose-Capillary: 160 mg/dL — ABNORMAL HIGH (ref 70–99)
Glucose-Capillary: 178 mg/dL — ABNORMAL HIGH (ref 70–99)

## 2021-03-16 LAB — COOXEMETRY PANEL
Carboxyhemoglobin: 1.3 % (ref 0.5–1.5)
Methemoglobin: 0.9 % (ref 0.0–1.5)
O2 Saturation: 83.8 %
Total hemoglobin: 11.4 g/dL — ABNORMAL LOW (ref 12.0–16.0)

## 2021-03-16 LAB — MAGNESIUM: Magnesium: 2.6 mg/dL — ABNORMAL HIGH (ref 1.7–2.4)

## 2021-03-16 LAB — PHOSPHORUS: Phosphorus: 2.8 mg/dL (ref 2.5–4.6)

## 2021-03-16 MED ORDER — ALBUMIN HUMAN 25 % IV SOLN
12.5000 g | Freq: Once | INTRAVENOUS | Status: AC
  Administered 2021-03-16: 12.5 g via INTRAVENOUS
  Filled 2021-03-16: qty 50

## 2021-03-16 MED ORDER — NOREPINEPHRINE 16 MG/250ML-% IV SOLN
0.0000 ug/min | INTRAVENOUS | Status: DC
  Administered 2021-03-16: 6 ug/min via INTRAVENOUS
  Administered 2021-03-16: 40 ug/min via INTRAVENOUS
  Administered 2021-03-17 – 2021-03-18 (×2): 17 ug/min via INTRAVENOUS
  Filled 2021-03-16 (×4): qty 250

## 2021-03-16 MED ORDER — TRAVASOL 10 % IV SOLN
INTRAVENOUS | Status: DC
  Filled 2021-03-16: qty 612

## 2021-03-16 MED ORDER — PLASMA-LYTE A IV SOLN: Freq: Once | INTRAVENOUS | Status: AC

## 2021-03-16 MED ORDER — PLASMA-LYTE A IV SOLN: INTRAVENOUS | Status: DC

## 2021-03-16 MED ORDER — VASOPRESSIN 20 UNITS/100 ML INFUSION FOR SHOCK
0.0000 [IU]/min | INTRAVENOUS | Status: DC
  Administered 2021-03-16: 0.02 [IU]/min via INTRAVENOUS
  Administered 2021-03-16: 0.03 [IU]/min via INTRAVENOUS
  Filled 2021-03-16 (×3): qty 100

## 2021-03-16 MED ORDER — LACTATED RINGERS IV BOLUS
500.0000 mL | Freq: Once | INTRAVENOUS | Status: AC
  Administered 2021-03-16: 500 mL via INTRAVENOUS

## 2021-03-16 NOTE — Progress Notes (Signed)
NAME:  Wayne [name], MRN:  [id number], DOB:  [birth year], LOS: 17 ADMISSION DATE:  02/09/2021, CONSULTATION DATE: 03/30/2021 REFERRING MD: Triad, CHIEF COMPLAINT: Sepsis small bowel ischemia obstruction  History of Present Illness:  66 year old male is originally admitted with right femoral pseudoaneurysm repair per vascular surgery.  He has had a rocky course and on 03/24/2021 went to the operating room for open laparotomy and found small bowel ischemia with repair, open abdomen with wound VAC in place.  Pulmonary critical care has been asked to assist in his care.   Pertinent  Medical History    has a past medical history of Abnormal pulmonary finding, Alcohol abuse, Anemia, Arthritis, Chronic systolic CHF (congestive heart failure) (HCC), COPD (chronic obstructive pulmonary disease) (HCC), Dyspnea, GERD (gastroesophageal reflux disease), History of blood transfusion (2004), History of stomach ulcers (2004), Hypercholesterolemia, Hypertension, Malnutrition (HCC), Moderate mitral regurgitation, Myocardial infarction (HCC) (2004), Peripheral vascular disease (HCC), Pre-diabetes, Pulmonary hypertension (HCC), and Tricuspid regurgitation (2017).   Significant Hospital Events: Including procedures, antibiotic start and stop dates in addition to other pertinent events   12/22 Admit with right femoral pseudoaneurysm repair per vascular surgery.  03/26/2021 Developed small bowel ischemia to operating room for open laparotomy 1/6-Aggressive fluid resuscitation for ongoing shock.  Started on epoprostenol and dobutamine for RV dysfunction on point-of-care echo 1/7-taken back to the OR for extensive small bowel necrosis.  Palliative care consult  Interim History / Subjective:  Tachycardia has improved on low-dose dobutamine and norepinephrine.  Patient will awake and appears uncomfortable  Objective   Blood pressure 117/66, pulse (!) 110, temperature 97.6 F (36.4 C), temperature source Axillary, resp.  rate 14, height 5\' 1"  (1.549 m), weight 50.3 kg, SpO2 97 %. CVP:  [7 mmHg-23 mmHg] 7 mmHg  Vent Mode: PRVC FiO2 (%):  [60 %-100 %] 60 % Set Rate:  [20 bmp] 20 bmp Vt Set:  [420 mL] 420 mL PEEP:  [10 cmH20] 10 cmH20 Plateau Pressure:  [14 cmH20-19 cmH20] 19 cmH20   Intake/Output Summary (Last 24 hours) at 03/16/2021 05/14/2021 Last data filed at 03/16/2021 0800 Gross per 24 hour  Intake 5703.24 ml  Output 3122 ml  Net 2581.24 ml   Filed Weights   02/20/2021 1624 03/03/21 0407 03/09/2021 0100  Weight: 45.4 kg 43.8 kg 50.3 kg    Examination: Gen:      No acute distress, frail, catchetic HEENT:  EOMI, sclera anicteric Neck:     No masses; no thyromegaly Lungs:    Clear to auscultation bilaterally; normal respiratory effort CV:         Regular rate and rhythm; no murmurs Abd:      Abd wound vac Ext:    No edema; adequate peripheral perfusion Skin:      Warm and dry; no rash Neuro: Sedated  Lab/imaging reviewed Significant for sodium 131, creatinine 2.13, AST 130, ALT 49 WBC count increased to 40.4  Ancillary tests personally reviewed:   Echocardiogram personally reviewed showing severe RV dysfunction.  Low normal LVEF. Assessment & Plan:  Refractory shock secondary to sepsis, bowel ischemia Abdominal compartment syndrome with open abdomen Continue pressors, start stress dose steroids Give additional IV fluids for resuscitation Continue Zosyn  Pulmonary hypertension On dobutamine, epoprostenol Monitor CVP  Femoral pseudoaneurysm status postrepair Management per vascular  Severe malnutrition Continue TPN  Kidney injury Follow urine output and creatinine  Goals of care Poor prognosis for meaningful recovery.  Palliative care consulted  Best Practice (right click and "Reselect all SmartList Selections" daily)  Diet/type: NPO started TPN DVT prophylaxis: prophylactic heparin  GI prophylaxis: PPI Lines: Central line and Arterial Line Foley:  Yes, and it is still  needed Code Status:  full code Last date of multidisciplinary goals of care discussion []   Critical care time:   The patient is critically ill with multiple organ system failure and requires high complexity decision making for assessment and support, frequent evaluation and titration of therapies, advanced monitoring, review of radiographic studies and interpretation of complex data.   Critical Care Time devoted to patient care services, exclusive of separately billable procedures, described in this note is 45 minutes.   MD [facility] See Amion for pager  If no response to pager , please call [phone] until 7pm After 7:00 pm call Elink  [phone] 03/16/2021, 9:38 AM

## 2021-03-16 NOTE — Progress Notes (Signed)
PHARMACY - TOTAL PARENTERAL NUTRITION CONSULT NOTE   Indication: Prolonged ileus  Patient Measurements: Height: 5' 1"  (154.9 cm) Weight: 50.3 kg (110 lb 14.3 oz) IBW/kg (Calculated) : 52.3 TPN AdjBW (KG): 45.4 Body mass index is 20.95 kg/m. Usual Weight: 45.4 kg  Assessment: 49 yom who presented to [facility] on [facility]/20/2021 with occluded right common femoral artery bypass graft with pseudoaneurysm s/p repair 02/28/21. He complained of abdominal pain for several days post-op, CT on 12/31 concerning for ischemia however diagnostic lap was neg. Repeat CT on 1/3 showed progressive mesenteric edema. Overnight on 1/6 his abdominal exam changed and he was taken emergently to the OR for ex-lap. 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 He has had minimal PO intake since 12/29 and is at high risk for refeeding syndrome. Pharmacy consulted for management of TPN.   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 - palliative care consults for Dolores discussion  Glucose / Insulin: prediabetes, no meds PTA, BG <180 pre steroid (s/p dexameth 10 mg on 1/6), now 160-210, 6 units SSI since starting TPN Electrolytes: Na 131, K 3.8 (goal >= 4), Mg 2.6 (goal >=2), Phos 2.8, CoCa 8.9, CO2/Cl WNL Renal: AKI, SCr up 2.13 (baseline <1), BUN 23 Hepatic: AST elevated 130, ALT 49 (known liver injury in OR 1/7), Tbili up 1.4, TG / Alk Phos WNL, albumin up 1.8 Intake / Output; MIVF: UOP down 0.5 ml/kg/hr, LR 50 ml/hr, NGT output 450 ml, drain output 1525 ml, net +7.9L GI Imaging: 12/31 CT A/P: diffusely edematous segment of bowel in the region of the terminal ileum and proximal cecum concern for ischemic bowel 1/3 CT A/P: progressive mesenteric edema 1/6 AXR: multiple mildly dilated loops of gas-filled small bowel R > L which may reflect ileus 1/7 AXR: mild gaseous distention of bowel involving both large and small bowel, likely mild ileus GI Surgeries / Procedures:  12/31: diagnostic  laparoscopy - neg for ischemic bowel 1/6: emergent ex-lap, reduction SB volvulus, abd wound VAC placement 1/7: ex-lap for abdominal compartment syndrome and diffuse SB necrosis, subtotal SB resection  1/8: possible re-exploration  Central access: CVC 1/6 TPN start date: 1/7  Nutritional Goals: Goal TPN rate is 70 mL/hr (provides 85 g of protein and 1803 kcals per day)  RD Assessment: Estimated Needs Total Energy Estimated Needs: 1700-1900 Total Protein Estimated Needs: 80-95 grams Total Fluid Estimated Needs: >/= 1.5 L  Current Nutrition:  NPO and TPN  Plan:  Increase TPN to 79m/hr at 1800 (provides 61 g protein, 180 g dextrose, 43 g lipids, 1288 kcal, meeting 75% needs)   Electrolytes in TPN: Na 563m/L, K 5074mL, Ca 5mE55m, Mg 5mEq79m and Phos 15mmo74m Cl:Ac 1:1 Add standard MVI and trace elements to TPN Reduce famotidine to 20 mg in TPN Continue Sensitive q6h SSI and adjust as needed - F/U BG as on several dextrose containing drips MIVF management per MD Monitor TPN labs on Mon/Thurs, daily until refeeding risk resolves F/U palliative care discussion  Thank you for involving pharmacy in this patient's care.  JennifRenold GentamD, BCPS Clinical Pharmacist Clinical phone for 03/16/2021 until 3p is [phone] 7:24 AM  **Pharmacist phone directory can be found on [email or web address] under [facility]

## 2021-03-16 NOTE — Consult Note (Signed)
Palliative Care Consult Note                                  Date: 03/16/2021   Patient Name: Wayne [name]  DOB: 18-Apr-1955  MRN: [id number]  Age / Sex: 66 y.o., male  PCP: Kathreen Devoid, [place] Referring Physician: Thomes Lolling*  Reason for Consultation: Establishing goals of care  HPI/Patient Profile: 66 y.o. male  with past medical history of Abnormal pulmonary finding, Alcohol abuse, Anemia, Arthritis, Chronic systolic CHF (congestive heart failure) ([place]), COPD (chronic obstructive pulmonary disease) ([place]), Dyspnea, GERD (gastroesophageal reflux disease), History of blood transfusion (2004), History of stomach ulcers (2004), Hypercholesterolemia, Hypertension, Malnutrition ([place]), Moderate mitral regurgitation, Myocardial infarction ([place]) (2004), Peripheral vascular disease ([place]), Pre-diabetes, Pulmonary hypertension ([place]), and Tricuspid regurgitation (2017). He was originally admitted on 03/05/2021 with right femoral pseudoaneurysm repair per vascular surgery.  He has had a rocky course and on 03/09/2021 went to the operating room for open laparotomy and found small bowel ischemia with repair, open abdomen with wound VAC in place.  Pulmonary critical care has been asked to assist in his care.   He underwent a second exploratory laparotomy and subtotal small bowel resection on 1 7 for abdominal compartment syndrome and diffuse small bowel necrosis.  Left in discontinuity with some patchy ischemia remaining in the small bowel and sigmoid colon at high risk for necrosis.  PMT was consulted for goals of care conversations  1/8 -increasing pressor requirement, decreased urine output.  Past Medical History:  Diagnosis Date   Abnormal pulmonary finding    a. preadmission note indicates pt followed by Dr. Gwenevere Ghazi for CHF, lung mass, COPD, pulmonary mycosis with plan for 3 mo f/u with CT by 03/2017 note.   Alcohol abuse     Anemia    Arthritis    "right hip; lower back" (05/11/2017)   Chronic systolic CHF (congestive heart failure) ([place])    a. EF 20-25% by echo 2017. b. 40-45% by echo in 01/2017.   COPD (chronic obstructive pulmonary disease) (HCC)    Dyspnea    GERD (gastroesophageal reflux disease)    History of blood transfusion 2004   "related to bleeding stomach ulcerts"   History of stomach ulcers 2004   Hypercholesterolemia    Hypertension    Malnutrition (HCC)    Moderate mitral regurgitation    Myocardial infarction [facility]) 2004   "think it was bleeding ulcers; not a heart attack" (05/11/2017), pt denies heart cath   Peripheral vascular disease ([place])    Pre-diabetes    Pulmonary hypertension ([place])    a. severe pulm HTN by echo 01/2017.   Tricuspid regurgitation 2017    Subjective:   This NP Walden Field reviewed medical records, received report from team, assessed the patient and then meet at the patient's bedside to discuss diagnosis, prognosis, GOC, EOL wishes disposition and options.  I met with the patient at the bedside but he is not able to meaningfully communicate.  I met with the patient's sister Inez Catalina and the patient's niece Rollene Fare along with vascular surgeon Dr. Unk Lightning.   Concept of Palliative Care was introduced as specialized medical care for people and their families living with serious illness.  If focuses on providing relief from the symptoms and stress of a serious illness.  The goal is to improve quality of life for both the patient and the family. Values and goals of  care important to patient and family were attempted to be elicited.  Created space and opportunity for patient  and family to explore thoughts and feelings regarding current medical situation   Natural trajectory and current clinical status were discussed. Questions and concerns addressed. Patient  encouraged to call with questions or concerns.    Patient/Family Understanding of Illness: They  understand he is very sick.  They were not expecting him to get so sick so suddenly.  We had a very frank discussion about the patient's current clinical situation.  He is on multiple pressors requiring escalating doses and still with unstable blood pressure.  He is vented, sedated.  He has 2 vacs.  Dr. Quentin Cornwall shared that this is likely not a survivable situation.  In the less than 1% chance that he would survive his hospitalization he had 0 chance of going home.  He would require, at best case scenario, 24-hour nursing care with TPN.  Life Review: The patient smoked and like the occasional beer.  He was fond of playing the lottery and often bought lottery tickets.  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  He is spiritual/religious, but did not attend church.  He would often tell his sister "I do believe in God and I do pray."  He likes to eat, especially cabbage, chicken with gravy, rice, pinto beans.  They jokingly stated that he "says what he thinks" and likes to get down to the point.  He enjoyed watching TV and specifically old westerns.  Patient Values: Independence, family  Goals: Ongoing discussions but patient's value of independence seems to be driving these discussions  Today's Discussion: In addition to the discussion about his acute clinical situation we discussed risks moving forward.  He is at high risk for cardiopulmonary arrest.  It was shared that once this happens it would likely happen repeatedly.  He is not likely to survive given the cause of cardiac arrest would be attributed to multisystem organ failure rather than a single possibly treatable cause.  We discussed the trauma that can occur during CPR.  After this discussion they expressed desire for the patient to be a DNR.  We also discussed the likely scenario moving forward including 24-hour nursing care if he survives, lifelong TPN which carries high risk of  infection.  Also discussed that this would be a very difficult life and not in scope of the patient's value of his independence.  They expressed that this is all happened so suddenly they would like some time to make further considerations other than DNR.  We expressed that no other decisions are needed today and that we would continue current treatments while they meet with the family and consider options.  I provided emotional general support through therapeutic listening, empathy, sharing of stories, reminiscing, joking, and other techniques.  Answered all questions and addressed all concerns to the best of my ability.  I provided the patient's sister and niece both with a contact card on how they can reach the palliative medicine team.  I provided both with a copy of "hard choices for loving people" to help with decisions and discussions.  I have volunteered that we are available should they need [place] for any assistance.  Review of Systems  Unable to perform ROS: Intubated   Objective:   Primary Diagnoses: Present on Admission:  Femoral artery aneurysm (HCC)  Ischemia of extremity   Physical Exam Vitals and  nursing note reviewed.  Constitutional:      Appearance: He is ill-appearing.     Interventions: He is sedated and intubated.  Cardiovascular:     Rate and Rhythm: Tachycardia present.  Pulmonary:     Effort: No respiratory distress. He is intubated.  Abdominal:     General: Abdomen is flat.     Palpations: Abdomen is soft.  Skin:    General: Skin is warm and dry.  Neurological:     Mental Status: He is unresponsive.     Comments: On sedation    Vital Signs:  BP 117/66    Pulse (!) 110    Temp 97.6 F (36.4 C)    Resp 14    Ht 5' 1"  (1.549 m)    Wt 50.3 kg    SpO2 97%    BMI 20.95 kg/m   Palliative Assessment/Data: 10%    Advanced Care Planning:   Primary Decision Maker: NEXT OF KIN  Code Status/Advance Care Planning: Full code  A discussion was had today  regarding advanced directives. Concepts specific to code status, artifical feeding and hydration, continued IV antibiotics and rehospitalization was had.  The difference between a aggressive medical intervention path and a palliative comfort care path for this patient at this time was had.  Decisions/Changes to ACP: Changed to DNR  Assessment & Plan:   Impression: Critically ill 66 year old male with prolonged hospital course with multiple trips to the ER as described above.  Currently with increasing pressor requirements, multisystem organ failure, high risk for any further operative procedures.  They have elected for the patient to be DNR.  They are further considering and discussing with other family members other options including the potential for deciding on no escalation versus comfort care.  There is also the option for continued aggressive care short of cardiopulmonary resuscitation.  Overall short-term and long-term prognosis is very poor.  SUMMARY OF RECOMMENDATIONS   Continue emotional and spiritual support of patient and family Continue goals of care discussions Change CODE STATUS to DNR Continue to treat the treatable, other medical interventions for now PMT will continue to follow  Symptom Management:  Per primary team Palliative medicine is available to assist as needed Currently on fentanyl drip for sedation protocol which will help provide comfort and agree with continuing  Prognosis:  Unable to determine  Discharge Planning:  To Be Determined   Discussed with: Patient's family, medical team, nursing team  Thank you for allowing [place] to participate in the care of PORTER MOES PMT will continue to support holistically.  Time Total: 90 min  Greater than 50%  of this time was spent counseling and coordinating care related to the above assessment and plan.  Signed by: Walden Field, NP Palliative Medicine Team  Team Phone # [phone] (Nights/Weekends)   03/16/2021, 9:03 AM

## 2021-03-16 NOTE — Progress Notes (Signed)
Decreased EPO to 35mL. Sp02 = 97%. RT will monitor.

## 2021-03-16 NOTE — Progress Notes (Signed)
EPO syringe changed. Set 30ng/kg/min per order.

## 2021-03-16 NOTE — Progress Notes (Addendum)
°  Progress Note    03/16/2021 9:45 AM 1 Day Post-Op  Subjective:  Pressor requirement increased form yesterday. UOP low. Intubated, sedated,    Vitals:   03/16/21 0753 03/16/21 0800  BP:  117/66  Pulse:  (!) 110  Resp:  14  Temp:  97.6 F (36.4 C)  SpO2: 98% 97%   Physical Exam: Cardiac: tachy Lungs: Intubated Incisions:  R groin with serosanguinous drainage Extremities:  feet warm, PT signals in the feet Abdomen:  tight, tender; thin drainage from abd incision - 1.5L Neurologic: intubated, sedated, not following commands  CBC    Component Value Date/Time   WBC 40.4 (H) 03/16/2021 0444   RBC 3.73 (L) 03/16/2021 0444   HGB 11.2 (L) 03/16/2021 0444   HCT 33.2 (L) 03/16/2021 0444   PLT 177 03/16/2021 0444   MCV 89.0 03/16/2021 0444   MCH 30.0 03/16/2021 0444   MCHC 33.7 03/16/2021 0444   RDW 20.6 (H) 03/16/2021 0444   LYMPHSABS 1.3 03/28/2021 0521   MONOABS 1.3 (H) 03/17/2021 0521   EOSABS 0.0 03/29/2021 0521   BASOSABS 0.0 04/06/2021 0521    BMET    Component Value Date/Time   NA 131 (L) 03/16/2021 0444   K 3.8 03/16/2021 0444   CL 101 03/16/2021 0444   CO2 23 03/16/2021 0444   GLUCOSE 186 (H) 03/16/2021 0444   BUN 23 03/16/2021 0444   CREATININE 2.13 (H) 03/16/2021 0444   CALCIUM 7.1 (L) 03/16/2021 0444   GFRNONAA 33 (L) 03/16/2021 0444   GFRAA >60 06/21/2017 0305    INR    Component Value Date/Time   INR 1.9 (H) 04/06/2021 0113     Intake/Output Summary (Last 24 hours) at 03/16/2021 0945 Last data filed at 03/16/2021 0800 Gross per 24 hour  Intake 5703.24 ml  Output 2947 ml  Net 2756.24 ml      Assessment/Plan:  66 y.o. male s/p repair of R CFA pseudoaneurysm 1 Day Post-Op   Pt currently with multisystem organ failure. Appears intravascularly depleted. Will supplement albumin and aggressive fluid resuscitation. I was present intraop yesterday and 90cm of bowel was left to demarcate. None of this bowel looked healthy. I agree with Dr. Bobbye Morton,  that best case scenario would be life-long TPN.  I was able to get in touch with his niece this morning and described his current clinical status. I asked her to consider what Wayne [name] would want regarding future care. She is planning to come in this afternoon after church. I think palliative care would be an excellent team to get on board and would help with discussions moving forward. No plan to RTOR. Will continue aggressive resuscitation at this time.    Wayne [name] [facility] [phone] 03/16/2021 9:45 AM

## 2021-03-16 NOTE — Progress Notes (Signed)
EPO syringe changed. Setting is 55mL.

## 2021-03-16 NOTE — Progress Notes (Signed)
Trauma/Critical Care Follow Up Note  Subjective:    Overnight Issues:   Objective:  Vital signs for last 24 hours: Temp:  [97.6 F (36.4 C)-100.6 F (38.1 C)] 98.2 F (36.8 C) (01/08 0000) Pulse Rate:  [83-118] 118 (01/08 0435) Resp:  [0-26] 22 (01/08 0435) BP: (81-161)/(53-128) 158/54 (01/08 0435) SpO2:  [93 %-100 %] 98 % (01/08 0435) Arterial Line BP: (85-158)/(43-65) 117/45 (01/08 0415) FiO2 (%):  [60 %-100 %] 60 % (01/08 0435)  Hemodynamic parameters for last 24 hours: CVP:  [7 mmHg-23 mmHg] 8 mmHg  Intake/Output from previous day: 01/07 0701 - 01/08 0700 In: 6131.7 [I.V.:5201.8; IV Piggyback:929.8] Out: 1947 [Urine:472; Emesis/NG output:50; Drains:725]  Intake/Output this shift: Total I/O In: 2049.8 [I.V.:2000; IV Piggyback:49.8] Out: 192 [Urine:192]  Vent settings for last 24 hours: Vent Mode: PRVC FiO2 (%):  [60 %-100 %] 60 % Set Rate:  [20 bmp] 20 bmp Vt Set:  [420 mL] 420 mL PEEP:  [10 cmH20] 10 cmH20 Plateau Pressure:  [14 cmH20-27 cmH20] 16 cmH20  Physical Exam:  Gen: comfortable, no distress Neuro: non-focal exam Neck: supple Pulm: unlabored breathing Abd: soft, appropriately TTP, abthera with S>S o/p GU: clear yellow urine Extr: wwp, 3+ edema   Results for orders placed or performed during the hospital encounter of 02/14/2021 (from the past 24 hour(s))  CBC with Differential/Platelet     Status: Abnormal   Collection Time: 03/22/2021  5:21 AM  Result Value Ref Range   WBC 32.0 (H) 4.0 - 10.5 K/uL   RBC 4.14 (L) 4.22 - 5.81 MIL/uL   Hemoglobin 12.3 (L) 13.0 - 17.0 g/dL   HCT 36.2 (L) 39.0 - 52.0 %   MCV 87.4 80.0 - 100.0 fL   MCH 29.7 26.0 - 34.0 pg   MCHC 34.0 30.0 - 36.0 g/dL   RDW 20.6 (H) 11.5 - 15.5 %   Platelets 189 150 - 400 K/uL   nRBC 0.3 (H) 0.0 - 0.2 %   Neutrophils Relative % 80 %   Neutro Abs 29.4 (H) 1.7 - 7.7 K/uL   Band Neutrophils 12 %   Lymphocytes Relative 4 %   Lymphs Abs 1.3 0.7 - 4.0 K/uL   Monocytes Relative 4 %    Monocytes Absolute 1.3 (H) 0.1 - 1.0 K/uL   Eosinophils Relative 0 %   Eosinophils Absolute 0.0 0.0 - 0.5 K/uL   Basophils Relative 0 %   Basophils Absolute 0.0 0.0 - 0.1 K/uL   nRBC 0 0 /100 WBC   Abs Immature Granulocytes 0.00 0.00 - 0.07 K/uL  Magnesium     Status: Abnormal   Collection Time: 04/07/2021  5:21 AM  Result Value Ref Range   Magnesium 2.8 (H) 1.7 - 2.4 mg/dL  Phosphorus     Status: None   Collection Time: 04/07/2021  5:21 AM  Result Value Ref Range   Phosphorus 3.2 2.5 - 4.6 mg/dL  Triglycerides     Status: None   Collection Time: 03/23/2021  5:21 AM  Result Value Ref Range   Triglycerides 32 <150 mg/dL  .Cooxemetry Panel (carboxy, met, total hgb, O2 sat)     Status: None   Collection Time: 03/28/2021  8:54 AM  Result Value Ref Range   Total hemoglobin 12.4 12.0 - 16.0 g/dL   O2 Saturation 69.8 %   Carboxyhemoglobin 1.2 0.5 - 1.5 %   Methemoglobin 1.0 0.0 - 1.5 %  Prepare RBC (crossmatch)     Status: None   Collection Time: 04/03/2021 11:44  AM  Result Value Ref Range   Order Confirmation      ORDER PROCESSED BY BLOOD BANK Performed at [facility], [place] [address], [place],  [address]   Glucose, capillary     Status: Abnormal   Collection Time: 03/16/2021  5:57 PM  Result Value Ref Range   Glucose-Capillary 144 (H) 70 - 99 mg/dL  Glucose, capillary     Status: Abnormal   Collection Time: 03/13/2021 11:53 PM  Result Value Ref Range   Glucose-Capillary 210 (H) 70 - 99 mg/dL   Comment 1 Notify RN    Comment 2 Document in Chart     Assessment & Plan: The plan of care was discussed with the bedside nurse for the day, who is in agreement with this plan and no additional concerns were raised.   Present on Admission:  Femoral artery aneurysm (HCC)  Ischemia of extremity    LOS: 17 days   Additional comments:I reviewed the patient's new clinical lab test results.   and I reviewed the patients new imaging test results.    S/p repair of right aortobifemoral  graft pseudoaneurysm 02/11/2021 Dr. Terri Piedra, S/p diagnostic laparoscopy 02/24/2021 Dr. Rosendo Gros - negative for ischemic bowel SB volvulus - s/p exlap, reduction of small bowel volvulus, placement of abdominal wound vac by Dr. Kae Heller 1/6. S/p exlap, subtotal SB resection 1/7 for ACS and diffuse SB necrosis. Left in discontinuity, some patchy ischemia of remaining SB and sigmoid. Increasing pressor req't this AM. 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 Consider takeback to OR today based on these discussions. Palliative consult placed.  ID - currently augmentin/ doxycycline 1/1>> stopped augmentin 1/6, zosyn 1/6 --> FEN - NPO, IVFs VTE - sq heparin Foley - oliguric   HTN HLD GERD COPD CHF Pulmonary HTN Hx alcohol abuse Hx PUD Acute respiratory failure  Lactic acidosis Leukocytosis Tachycardia    Jesusita Oka, MD Trauma & General Surgery Please use [email or web address] to contact on call provider  03/16/2021  *Care during the described time interval was provided by me. I have reviewed this patient's available data, including medical history, events of note, physical examination and test results as part of my evaluation.

## 2021-03-17 DIAGNOSIS — K567 Ileus, unspecified: Secondary | ICD-10-CM

## 2021-03-17 DIAGNOSIS — R069 Unspecified abnormalities of breathing: Secondary | ICD-10-CM

## 2021-03-17 DIAGNOSIS — R579 Shock, unspecified: Secondary | ICD-10-CM

## 2021-03-17 DIAGNOSIS — R109 Unspecified abdominal pain: Secondary | ICD-10-CM

## 2021-03-17 DIAGNOSIS — K55029 Acute infarction of small intestine, extent unspecified: Secondary | ICD-10-CM

## 2021-03-17 LAB — CBC
HCT: 26.2 % — ABNORMAL LOW (ref 39.0–52.0)
Hemoglobin: 9.3 g/dL — ABNORMAL LOW (ref 13.0–17.0)
MCH: 32.2 pg (ref 26.0–34.0)
MCHC: 35.5 g/dL (ref 30.0–36.0)
MCV: 90.7 fL (ref 80.0–100.0)
Platelets: 185 10*3/uL (ref 150–400)
RBC: 2.89 MIL/uL — ABNORMAL LOW (ref 4.22–5.81)
RDW: 20.4 % — ABNORMAL HIGH (ref 11.5–15.5)
WBC: 33.2 10*3/uL — ABNORMAL HIGH (ref 4.0–10.5)
nRBC: 0.2 % (ref 0.0–0.2)

## 2021-03-17 LAB — COOXEMETRY PANEL
Carboxyhemoglobin: 1.2 % (ref 0.5–1.5)
Methemoglobin: 1.3 % (ref 0.0–1.5)
O2 Saturation: 83.4 %
Total hemoglobin: 9.1 g/dL — ABNORMAL LOW (ref 12.0–16.0)

## 2021-03-17 LAB — MAGNESIUM: Magnesium: 2.8 mg/dL — ABNORMAL HIGH (ref 1.7–2.4)

## 2021-03-17 LAB — COMPREHENSIVE METABOLIC PANEL
ALT: 54 U/L — ABNORMAL HIGH (ref 0–44)
AST: 130 U/L — ABNORMAL HIGH (ref 15–41)
Albumin: 1.6 g/dL — ABNORMAL LOW (ref 3.5–5.0)
Alkaline Phosphatase: 122 U/L (ref 38–126)
Anion gap: 10 (ref 5–15)
BUN: 30 mg/dL — ABNORMAL HIGH (ref 8–23)
CO2: 21 mmol/L — ABNORMAL LOW (ref 22–32)
Calcium: 6.8 mg/dL — ABNORMAL LOW (ref 8.9–10.3)
Chloride: 98 mmol/L (ref 98–111)
Creatinine, Ser: 2.56 mg/dL — ABNORMAL HIGH (ref 0.61–1.24)
GFR, Estimated: 27 mL/min — ABNORMAL LOW (ref >60)
Glucose, Bld: 148 mg/dL — ABNORMAL HIGH (ref 70–99)
Potassium: 4 mmol/L (ref 3.5–5.1)
Sodium: 129 mmol/L — ABNORMAL LOW (ref 135–145)
Total Bilirubin: 1.3 mg/dL — ABNORMAL HIGH (ref 0.3–1.2)
Total Protein: 3.7 g/dL — ABNORMAL LOW (ref 6.5–8.1)

## 2021-03-17 LAB — GLUCOSE, CAPILLARY
Glucose-Capillary: 148 mg/dL — ABNORMAL HIGH (ref 70–99)
Glucose-Capillary: 154 mg/dL — ABNORMAL HIGH (ref 70–99)
Glucose-Capillary: 115 mg/dL — ABNORMAL HIGH (ref 70–99)
Glucose-Capillary: 114 mg/dL — ABNORMAL HIGH (ref 70–99)

## 2021-03-17 LAB — TRIGLYCERIDES
Triglycerides: 555 mg/dL — ABNORMAL HIGH (ref <150)
Triglycerides: 719 mg/dL — ABNORMAL HIGH (ref <150)
Triglycerides: 707 mg/dL — ABNORMAL HIGH (ref <150)

## 2021-03-17 LAB — PHOSPHORUS: Phosphorus: 3 mg/dL (ref 2.5–4.6)

## 2021-03-17 MED ORDER — TRAVASOL 10 % IV SOLN
INTRAVENOUS | Status: DC
  Filled 2021-03-17: qty 856.8

## 2021-03-17 MED ORDER — SODIUM CHLORIDE 0.9 % IV SOLN
100.0000 mg | Freq: Two times a day (BID) | INTRAVENOUS | Status: DC
  Administered 2021-03-17 – 2021-03-18 (×3): 100 mg via INTRAVENOUS
  Filled 2021-03-17 (×3): qty 100

## 2021-03-17 MED ORDER — LACTATED RINGERS IV BOLUS
500.0000 mL | Freq: Once | INTRAVENOUS | Status: AC
  Administered 2021-03-17: 500 mL via INTRAVENOUS

## 2021-03-17 MED ORDER — DEXTROSE-NACL 5-0.9 % IV SOLN: INTRAVENOUS | Status: DC

## 2021-03-17 MED ORDER — FAT EMUL FISH OIL/PLANT BASED 20% (SMOFLIPID)IV EMUL
INTRAVENOUS | Status: DC
Start: 2021-03-17 — End: 2021-03-17

## 2021-03-17 NOTE — Progress Notes (Signed)
I had a long discussion about the patient's current situation from a general surgery standpoint with the patient's niece on the phone.  I explained what all has transpired since Friday as well as what his current situation is regarding his intestines.  We discussed he only has approximately 90cm of SB left, but that was questionable viability on Saturday as well as some questionable viability of some of his colon.  We discussed 2 options.  The first is we can take him back to the OR to further evaluate and assess what remains, but I made it very clear that I think this will unlikely be viable, but on the off chance it is, I still do not think this will change his clinic course or his outcome, which is still grime.  We did speak on the fact that this information though, could help her and her mother make an ultimate decision for the patient and put them more at ease.  The second option is no further surgery and to transition towards comfort care.  Palliative care is involved and the niece states she spoke with palliative care this morning as well.  I relayed my concern that I do think he is a candidate for SB transplant and as well not a good candidate for long-term TNA due to the high risks of infection, clot, and further complications from this form of nutrition.  We discussed his overall malnutrition and multiple other comorbidities make his prognosis grime and I don't think he will survive this insult.  She understands all of this and was very appreciate of the phone call and information.  She is at work currently but is going to call her mother to discuss these option with her.  They are likely going to try to come to the hospital this after and possibly have a decision.  If not, that is ok but will likely need to start having some further decision by tomorrow.  I discussed this with the primary service as as well as her nurse so she was aware that they are likely planning to come up later today.  Letha Cape 10:51 AM 03/17/2021

## 2021-03-17 NOTE — Progress Notes (Addendum)
PHARMACY - TOTAL PARENTERAL NUTRITION CONSULT NOTE  Indication: Prolonged ileus  Patient Measurements: Height: 5\' 1"  (154.9 cm) Weight: 50.3 kg (110 lb 14.3 oz) IBW/kg (Calculated) : 52.3 TPN AdjBW (KG): 45.4 Body mass index is 20.95 kg/m. Usual Weight: 45.4 kg  Assessment:  65 yom who presented to [facility] on Mar 15, 2021 with occluded right common femoral artery bypass graft with pseudoaneurysm s/p repair 02/28/21. He complained of abdominal pain for several days post-op, CT on 12/31 concerning for ischemia however diagnostic lap was neg. Repeat CT on 1/3 showed progressive mesenteric edema. Overnight on 1/6 he was taken emergently to the OR for ex-lap. 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 He has had minimal PO intake since 12/29 and is at high risk for refeeding syndrome. Pharmacy consulted for management of TPN.   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  CBGs controlled (s/p Decadron 10mg  on 1/6) Used 10 units SSI in past 24 hrs Electrolytes: Na down to 129, low CO2, Mag elevated at 2.8, others WNL Renal: AKI - SCr up 2.56 (BL SCr <1), BUN 30 Hepatic: known liver injury in OR 1/7 - AST/ALT stable, tbili 1.3, albumin 1.6, TG elevated at 555 (was 32, so this could be a contaminant) Intake / Output; MIVF: UOP 0.6 ml/kg/hr, LR 50 ml/hr, NGT , drain 3/7, net +8.7L GI Imaging: 12/31 CT A/P: diffusely edematous segment of bowel in region of terminal ileum and proximal cecum concern for ischemic bowel 1/3 CT A/P: progressive mesenteric edema 1/6 AXR: multiple mildly dilated loops of gas-filled small bowel R > L which may reflect ileus 1/7 AXR: mild gaseous distention of bowel involving both large and small bowel, likely mild ileus GI Surgeries / Procedures:  12/31: diagnostic laparoscopy - neg for ischemic bowel 1/6: emergent ex-lap, reduction SB  volvulus, abd wound VAC placement 1/7: ex-lap for abdominal compartment syndrome and diffuse SB necrosis, subtotal SB resection  1/8: possible re-exploration though Surgery rec against, pending GOC discussion  Central access: CVC 03/28/2021 TPN start date: 03/17/2021  Nutritional Goals, RD Assessment: Estimated Needs Total Energy Estimated Needs: 1700-1900 Total Protein Estimated Needs: 80-95 grams Total Fluid Estimated Needs: >/= 1.5 L  Current Nutrition:  TPN  Plan:  Increase TPN to goal rate of 70 ml/hr at 1800 TPN will provide 86g AA, 252g CHO and 34g ILE for a total of 1536kCal today, meeting 100% of protein goal and ~90% of kCal goal Electrolytes in TPN: increase Na 144mEq/L, reduce K to 74mEq/L with worsening SCr, Ca 30mEq/L, reduce Mg to 64mEq/L, Phos 69mmol/L, Cl:Ac 1:1 for now Add standard MVI and trace elements to TPN Pepcid 20 mg in TPN with CrCL < 30 ml/min Continue sensitive SSI Q6H  MIVF management per MD F/U AM labs F/U palliative care discussion  Approaching deadline to enter TPN order (TG was 32 two days ago >> 155 and 719 today but possible contamination per discussion with RN).  Will enter TPN order with reduced lipid (21% of total kCal) and f/u repeat TG.  Change doxycycline to IV; D/C per tube docusate Hold ASA per discussion with CCM  Wayne [name] D. 1m, PharmD, BCPS, BCCCP 03/17/2021, 10:59 AM   ===============================  Addendum: TG level obtained peripherally per phlebotomist is elevated at 707 mg/dL Today's TPN bag was made with minimal lipid; however, will hold tonight's TPN given significant elevation Infuse D5NS at 70 ml/hr F/U  AM labs, GoC discussion to reformulate TPN formula in AM if needed  Wayne [name] D. Laney Potash, PharmD, BCPS, BCCCP 03/17/2021, 2:56 PM

## 2021-03-17 NOTE — Progress Notes (Signed)
Patient ID: QUINN QUAM, male   DOB: 04-22-1955, 66 y.o.   MRN: [id number] [facility] Surgery Progress Note  2 Days Post-Op  Subjective: Intubated.  Off vaso, still on some levo.  Getting EPO.  Has put out about 1350cc of serosang output from VAC overnight.  Some UOP  Objective: Vital signs in last 24 hours: Temp:  [97.9 F (36.6 C)-99.3 F (37.4 C)] 98.5 F (36.9 C) (01/09 0800) Pulse Rate:  [81-107] 86 (01/09 0846) Resp:  [0-20] 20 (01/09 0846) BP: (82-150)/(40-113) 111/75 (01/09 0846) SpO2:  [90 %-100 %] 99 % (01/09 0846) Arterial Line BP: (72-171)/(40-70) 72/60 (01/09 0800) FiO2 (%):  [60 %] 60 % (01/09 0846) Last BM Date: 02/17/2021  Intake/Output from previous day: 01/08 0701 - 01/09 0700 In: 3851.1 [I.V.:3660.5; IV Piggyback:190.6] Out: 3473 [Urine:773; Emesis/NG output:600; Drains:2100] Intake/Output this shift: Total I/O In: 326.7 [I.V.:301.6; IV Piggyback:25] Out: -   PE: Gen:  critically ill on vent Abd: distended still with VAC in place, SS output in VAC cannister.  Abthera VAC in place.  NGT in place with 600cc of output in 24 hours. GU: just emptied, but about 10cc of very clear urine present in foley  Lab Results:  Recent Labs    03/16/21 0444 03/16/21 1428 03/17/21 0421  WBC 40.4*  --  33.2*  HGB 11.2* 9.5* 9.3*  HCT 33.2* 28.0* 26.2*  PLT 177  --  185   BMET Recent Labs    03/16/21 0444 03/16/21 1428 03/17/21 0421  NA 131* 134* 129*  K 3.8 3.5 4.0  CL 101  --  98  CO2 23  --  21*  GLUCOSE 186*  --  148*  BUN 23  --  30*  CREATININE 2.13*  --  2.56*  CALCIUM 7.1*  --  6.8*   PT/INR Recent Labs    03/20/2021 0113  LABPROT 22.0*  INR 1.9*   CMP     Component Value Date/Time   NA 129 (L) 03/17/2021 0421   K 4.0 03/17/2021 0421   CL 98 03/17/2021 0421   CO2 21 (L) 03/17/2021 0421   GLUCOSE 148 (H) 03/17/2021 0421   BUN 30 (H) 03/17/2021 0421   CREATININE 2.56 (H) 03/17/2021 0421   CALCIUM 6.8 (L) 03/17/2021 0421   PROT  3.7 (L) 03/17/2021 0421   ALBUMIN 1.6 (L) 03/17/2021 0421   AST 130 (H) 03/17/2021 0421   ALT 54 (H) 03/17/2021 0421   ALKPHOS 122 03/17/2021 0421   BILITOT 1.3 (H) 03/17/2021 0421   GFRNONAA 27 (L) 03/17/2021 0421   GFRAA >60 06/21/2017 0305   Lipase     Component Value Date/Time   LIPASE 24 06/11/2017 1502       Studies/Results: DG Abd Portable 1V  Result Date: 04/06/2021 CLINICAL DATA:  OG tube placement EXAM: PORTABLE ABDOMEN - 1 VIEW COMPARISON:  03/26/2021, 1:46 a.m. FINDINGS: Esophagogastric tube has been repositioned, and remains with tip and side port below the diaphragm, tip directed over the gastric fundus. Gas-filled, although nondistended loops of small bowel and colon in the included abdomen. No obvious free air on supine radiograph. IMPRESSION: Esophagogastric tube has been repositioned, and remains with tip and side port below the diaphragm, tip directed over the gastric fundus. Electronically Signed   By: Jearld Lesch M.D.   On: 04/03/2021 14:23   ECHOCARDIOGRAM COMPLETE  Result Date: 03/23/2021    ECHOCARDIOGRAM REPORT   Patient Name:   RYKAR LEBLEU Date of Exam: 03/19/2021 Medical Rec #:  [id number]       Height:       61.0 in Accession #:    [id number]      Weight:       110.9 lb Date of Birth:  1955/09/14       BSA:          1.470 m Patient Age:    39 years        BP:           133/59 mmHg Patient Gender: M               HR:           107 bpm. Exam Location:  Inpatient Procedure: 2D Echo, Cardiac Doppler, Color Doppler and Intracardiac            Opacification Agent Indications:    Non-ischemic cardiomyopathy  History:        Patient has prior history of Echocardiogram examinations, most                 recent 05/26/2017. COPD; Risk Factors:Hypertension. Aneurysmal                 IAS on last echo.  Sonographer:    Ross Ludwig RDCS (AE) Referring Phys: [id number] Lynnell Catalan  Sonographer Comments: Technically challenging study due to limited acoustic windows, Technically  difficult study due to poor echo windows, suboptimal parasternal window, suboptimal apical window, suboptimal subcostal window and echo performed with patient supine and on artificial respirator. Image acquisition challenging due to COPD. IMPRESSIONS  1. Left ventricular ejection fraction, by estimation, is 50 to 55%. The left ventricle has low normal function.  2. Right ventricular systolic function moderate to severely reduced. The right ventricular size is moderately enlarged. Mildly increased right ventricular wall thickness.  3. Right atrial size was mildly dilated.  4. A small pericardial effusion is present.  5. Mild mitral valve regurgitation.  6. The aortic valve was not well visualized. Aortic valve regurgitation is not visualized.  7. The inferior vena cava is normal in size with greater than 50% respiratory variability, suggesting right atrial pressure of 3 mmHg. FINDINGS  Left Ventricle: Left ventricular ejection fraction, by estimation, is 50 to 55%. The left ventricle has low normal function. The left ventricular internal cavity size was normal in size. There is no left ventricular hypertrophy. Right Ventricle: The right ventricular size is moderately enlarged. Mildly increased right ventricular wall thickness. Right ventricular systolic function moderate to severely reduced. Left Atrium: Left atrial size was normal in size. Right Atrium: Right atrial size was mildly dilated. Pericardium: A small pericardial effusion is present. Mitral Valve: There is mild thickening of the mitral valve leaflet(s). Mild mitral annular calcification. Mild mitral valve regurgitation. Tricuspid Valve: The tricuspid valve is normal in structure. Tricuspid valve regurgitation is trivial. Aortic Valve: The aortic valve was not well visualized. Aortic valve regurgitation is not visualized. Pulmonic Valve: The pulmonic valve was not well visualized. Aorta: The aortic root and ascending aorta are structurally normal, with no  evidence of dilitation. Venous: The inferior vena cava is normal in size with greater than 50% respiratory variability, suggesting right atrial pressure of 3 mmHg. IAS/Shunts: No atrial level shunt detected by color flow Doppler.  LEFT VENTRICLE PLAX 2D LVIDd:         3.80 cm LVIDs:         2.90 cm LV PW:         1.10 cm LV IVS:  1.10 cm LVOT diam:     1.90 cm LVOT Area:     2.84 cm  LEFT ATRIUM         Index LA diam:    2.60 cm 1.77 cm/m   AORTA Ao Root diam: 2.60 cm Ao Asc diam:  2.30 cm  SHUNTS Systemic Diam: 1.90 cm Dietrich Pates MD Electronically signed by Dietrich Pates MD Signature Date/Time: 03/24/2021/1:28:36 PM    Final     Anti-infectives: Anti-infectives (From admission, onward)    Start     Dose/Rate Route Frequency Ordered Stop   03/25/2021 2200  doxycycline (VIBRA-TABS) tablet 100 mg        100 mg Per Tube Every 12 hours 03/19/2021 2000     04/05/2021 2000  piperacillin-tazobactam (ZOSYN) IVPB 3.375 g        3.375 g 12.5 mL/hr over 240 Minutes Intravenous Every 8 hours 03/22/2021 1847     03/24/2021 1415  Ampicillin-Sulbactam (UNASYN) 3 g in sodium chloride 0.9 % 100 mL IVPB  Status:  Discontinued        3 g 200 mL/hr over 30 Minutes Intravenous Every 6 hours 03/11/2021 1325 03/11/2021 1847   03/17/2021 0915  piperacillin-tazobactam (ZOSYN) IVPB 3.375 g  Status:  Discontinued        3.375 g 12.5 mL/hr over 240 Minutes Intravenous Every 8 hours 03/16/2021 0827 04/05/2021 1324   03/09/21 1430  doxycycline (VIBRA-TABS) tablet 100 mg  Status:  Discontinued        100 mg Oral Every 12 hours 03/09/21 1342 03/29/2021 2000   03/09/21 1430  amoxicillin-clavulanate (AUGMENTIN) 875-125 MG per tablet 1 tablet  Status:  Discontinued        1 tablet Oral Every 12 hours 03/09/21 1342 03/22/2021 0827   02/14/2021 1700  vancomycin (VANCOREADY) IVPB 750 mg/150 mL  Status:  Discontinued        750 mg 150 mL/hr over 60 Minutes Intravenous Every 24 hours 02/15/2021 1423 03/09/21 1342   02/07/2021 1600  Ampicillin-Sulbactam  (UNASYN) 3 g in sodium chloride 0.9 % 100 mL IVPB  Status:  Discontinued        3 g 200 mL/hr over 30 Minutes Intravenous Every 6 hours 02/10/2021 1423 03/09/21 1342   03/07/21 1200  doxycycline (VIBRA-TABS) tablet 100 mg  Status:  Discontinued        100 mg Oral Every 12 hours 03/07/21 1109 02/06/2021 1407   03/07/21 1200  amoxicillin-clavulanate (AUGMENTIN) 875-125 MG per tablet 1 tablet  Status:  Discontinued        1 tablet Oral Every 12 hours 03/07/21 1109 02/18/2021 1407   03/06/21 2122  vancomycin (VANCOCIN) IVPB 1000 mg/200 mL premix  Status:  Discontinued        1,000 mg 200 mL/hr over 60 Minutes Intravenous Every 24 hours 03/06/21 0519 03/07/21 1109   03/06/21 1330  amoxicillin (AMOXIL) capsule 500 mg        500 mg Oral  Once 03/06/21 1238 03/06/21 1436   03/01/21 1000  vancomycin (VANCOCIN) IVPB 1000 mg/200 mL premix  Status:  Discontinued        1,000 mg 200 mL/hr over 60 Minutes Intravenous Every 36 hours 02/28/21 0326 03/06/21 0519   02/28/21 1600  ceFEPIme (MAXIPIME) 2 g in sodium chloride 0.9 % 100 mL IVPB  Status:  Discontinued        2 g 200 mL/hr over 30 Minutes Intravenous Every 12 hours 02/28/21 0326 03/07/21 1109   02/28/21 0415  ceFEPIme (MAXIPIME)  2 g in sodium chloride 0.9 % 100 mL IVPB        2 g 200 mL/hr over 30 Minutes Intravenous  Once 02/28/21 0315 02/28/21 0530   2021/03/04 2232  vancomycin (VANCOCIN) 1-5 GM/200ML-% IVPB       Note to Pharmacy: Brien Mates D: cabinet override      04-Mar-2021 2232 02/28/21 0319        Assessment/Plan S/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 Randie Heinz POD 3/2/9, S/p diagnostic laparoscopy 02/13/2021 Dr. Derrell Lolling, ex lap with detorsion of SB volvulus with open abdomen Dr. Fredricka Bonine 1/6, take back with SBR of all but 90cm due to ischemia, still with patchy ischemia in remaining bowel, Dr. Bedelia Person 1/7  -patient with very poor expected outcome given above findings along with his previous comorbidities and  malnutrition.   -at this point, given the likelihood of further ischemia in his SB and some concerning areas in his colon on last look, would not recommend return to OR, but further family discussions regarding GOC.  The patient, if he even survived, would have a poor quality of life and be TNA dependent, which is not desirable but also carries many infectious risks.   -have asked RN to page me if family arrives today to further discuss from a general surgery perspective what his expected outcome looks like in hopes of transitioning to comfort care.   -greatly appreciate Dr. Sherral Hammers assistance with the family over the weekend.  ID - currently augmentin/ doxycycline 1/1>> stopped augmentin 1/6, zosyn 1/6 --> FEN - NPO, IVFs/NGT/wound VAC (open abdomen) VTE - sq heparin Foley - in place  HTN HLD GERD COPD CHF Pulmonary HTN Hx alcohol abuse Hx PUD Acute respiratory failure - on 6L Alpha Lactic acidosis Leukocytosis Tachycardia   LOS: 18 days    Letha Cape, [facility] Surgery 03/17/2021, 8:47 AM Please see Amion for pager number during day hours 7:00am-4:30pm

## 2021-03-17 NOTE — Progress Notes (Addendum)
°  Progress Note    03/17/2021 8:52 AM 2 Days Post-Op  Subjective:  Intubated, appears somewhat agitated   Vitals:   03/17/21 0800 03/17/21 0846  BP: (!) 122/40 111/75  Pulse: 89 86  Resp: 20 20  Temp: 98.5 F (36.9 C)   SpO2: 98% 99%   Physical Exam: Cardiac:  regular Lungs:  intubated Incisions: R groin wound dressed, c/d/I Extremities:  Lower extremities well perfused with Doppler Dp and PT signals bilaterally Abdomen:  tight, wound VAC to suction, Serosanguinous output in VAC cannister. L abdominal incisions dressed, c/d/I. NGT in place Neurologic: intubated, alert, appears agitated  CBC    Component Value Date/Time   WBC 33.2 (H) 03/17/2021 0421   RBC 2.89 (L) 03/17/2021 0421   HGB 9.3 (L) 03/17/2021 0421   HCT 26.2 (L) 03/17/2021 0421   PLT 185 03/17/2021 0421   MCV 90.7 03/17/2021 0421   MCH 32.2 03/17/2021 0421   MCHC 35.5 03/17/2021 0421   RDW 20.4 (H) 03/17/2021 0421   LYMPHSABS 1.3 26-Mar-2021 0521   MONOABS 1.3 (H) 03-26-2021 0521   EOSABS 0.0 2021/03/26 0521   BASOSABS 0.0 Mar 26, 2021 0521    BMET    Component Value Date/Time   NA 129 (L) 03/17/2021 0421   K 4.0 03/17/2021 0421   CL 98 03/17/2021 0421   CO2 21 (L) 03/17/2021 0421   GLUCOSE 148 (H) 03/17/2021 0421   BUN 30 (H) 03/17/2021 0421   CREATININE 2.56 (H) 03/17/2021 0421   CALCIUM 6.8 (L) 03/17/2021 0421   GFRNONAA 27 (L) 03/17/2021 0421   GFRAA >60 06/21/2017 0305    INR    Component Value Date/Time   INR 1.9 (H) 03-26-2021 0113     Intake/Output Summary (Last 24 hours) at 03/17/2021 0852 Last data filed at 03/17/2021 0800 Gross per 24 hour  Intake 3696.05 ml  Output 3473 ml  Net 223.05 ml     Assessment/Plan:  66 y.o. male is s/p R CFA pseudoaneurysm repair 17 Days post op. S/p diagnostic laparoscopy 02/28/2021, ex lap with detorsion of SB volvulus with open abdomen 1/6, take back with SBR of all but 90cm due to ischemia, still with patchy ischemia in remaining bowel 1/7    Remains critically ill on vent with multisystem organ failure Remains intravascularly depleted Continues to require high dose of pressors Getting TPN and EPO Some UOP Unfortunately poor prognosis for meaningful recovery Patient made DNR yesterday. Further goals of care discussion to be had with family today Appreciate General surgery, CCM and Palliative care assistance  DVT prophylaxis:  sq heparin   Graceann Congress, PA-C [facility] [phone] 03/17/2021 8:52 AM  I have independently interviewed and examined patient and agree with PA assessment and plan above.  Family discussion pending.  Appreciate general surgery and critical care recommendations.  Jameila Keeny C. Randie Heinz, MD [facility] [place] Office: [phone] Pager: [phone]

## 2021-03-17 NOTE — Progress Notes (Signed)
Wayne Field, NP with palliative care medicine, myself, and RN, Wayne [name], met with the patient's brother, sister, and niece this afternoon.  Full conversation regarding his current medical situation was had as well as expected outcomes and options, as outlined in my previous note today.  The family is very understanding of how poorly Wayne [name] is doing and that he is very likely going to pass from this situation regardless of our medical capabilities.  He has had worsening of his renal function with minimal UOP today, some desat vent issues recently, and appears overall more uncomfortable than he did earlier this morning.  His triglycerides are also significantly elevated on his TNA which makes me concerned for liver issues.  We discussed options of returning to the OR, although this would be highly unlikely to change his outcome, vs transition to comfort care to pass away with peace, comfort, and dignity.  The family was in complete agreement that the patient appears to be uncomfortable and that they do not think another operation is in his best interest.  They would like to continue current care for tonight to allow for family to come see him and transition to full comfort care with liberation from the ventilator, etc tomorrow.  We did discuss that he is a DNR and if he were to worsen tonight there is a possibility that he may pass before he can be transitioned.  They understand this.  They were very appreciative of our meeting and for all the care that has been provided by all services during his stay.  Appreciate palliative care's assistance in this meeting and care for the patient.  [name] 4:32 PM 03/17/2021

## 2021-03-17 NOTE — Progress Notes (Signed)
NAME:  Wayne [name], MRN:  [id number], DOB:  07/15/1955, LOS: 18 ADMISSION DATE:  03/03/2021, CONSULTATION DATE: 03/12/2021 REFERRING MD: [facility], CHIEF COMPLAINT: Sepsis small bowel ischemia obstruction  History of Present Illness:  66 year old male is originally admitted with right femoral pseudoaneurysm repair per vascular surgery.  He has had a rocky course and on 04/06/2021 went to the operating room for open laparotomy and found small bowel ischemia with repair, open abdomen with wound VAC in place.  Pulmonary critical care has been asked to assist in his care.   Pertinent  Medical History    has a past medical history of Abnormal pulmonary finding, Alcohol abuse, Anemia, Arthritis, Chronic systolic CHF (congestive heart failure) (HCC), COPD (chronic obstructive pulmonary disease) (HCC), Dyspnea, GERD (gastroesophageal reflux disease), History of blood transfusion (2004), History of stomach ulcers (2004), Hypercholesterolemia, Hypertension, Malnutrition (HCC), Moderate mitral regurgitation, Myocardial infarction (HCC) (2004), Peripheral vascular disease (HCC), Pre-diabetes, Pulmonary hypertension (HCC), and Tricuspid regurgitation (2017).   Significant Hospital Events: Including procedures, antibiotic start and stop dates in addition to other pertinent events   12/22 Admit with right femoral pseudoaneurysm repair per vascular surgery.  03/24/2021 Developed small bowel ischemia to operating room for open laparotomy 1/6-Aggressive fluid resuscitation for ongoing shock.  Started on epoprostenol and dobutamine for RV dysfunction on point-of-care echo 1/7-taken back to the OR for extensive small bowel necrosis.  Palliative care consult 1/8 palliative care consulted, Code status changed to DNR.   Interim History / Subjective:  No acute events overnight. Remains on vent, multiple pressors, epo  Objective   Blood pressure 122/65, pulse 83, temperature 98.5 F (36.9 C), temperature source Axillary,  resp. rate 20, height 5\' 1"  (1.549 m), weight 50.3 kg, SpO2 98 %. CVP:  [10 mmHg-18 mmHg] 13 mmHg  Vent Mode: PRVC FiO2 (%):  [50 %-60 %] 50 % Set Rate:  [20 bmp-22 bmp] 22 bmp Vt Set:  [420 mL] 420 mL PEEP:  [10 cmH20] 10 cmH20 Plateau Pressure:  [18 cmH20-26 cmH20] 20 cmH20   Intake/Output Summary (Last 24 hours) at 03/17/2021 1039 Last data filed at 03/17/2021 1000 Gross per 24 hour  Intake 3992.88 ml  Output 3473 ml  Net 519.88 ml    Filed Weights   02/14/2021 1624 03/03/21 0407 03/10/2021 0100  Weight: 45.4 kg 43.8 kg 50.3 kg    Examination: General:  frail elderly male on NAD Neuro:  Sedated, PERRL HEENT:  [place]/AT, No JVD noted Cardiovascular:  RRR, no MRG Lungs:  Clear bilateral breath sounds Abdomen:  Wound vac in place.  Musculoskeletal:  No acute deformity Skin:  abdominal incision with wound vac.   Ancillary tests personally reviewed:   Echocardiogram showing severe RV dysfunction.  Low normal LVEF.  Assessment & Plan:  Refractory shock secondary to sepsis, bowel ischemia Abdominal compartment syndrome with open abdomen Continue pressors, start stress dose steroids Levo, dobutamine, epoprostenol Vaso stopped.  Give IVF to replace losses Continue Zosyn, doxy  Pulmonary hypertension On dobutamine, epoprostenol Monitor CVP  Femoral pseudoaneurysm status postrepair Management per vascular  Severe malnutrition Continue TPN  Kidney injury Follow urine output and creatinine  Goals of care Poor prognosis for meaningful recovery.  Palliative care consulted  Best Practice (right click and "Reselect all SmartList Selections" daily)   Diet/type: NPO started TPN DVT prophylaxis: prophylactic heparin  GI prophylaxis: PPI Lines: Central line and Arterial Line Foley:  Yes, and it is still needed Code Status:  full code Last date of multidisciplinary goals of care discussion []   Critical care time: 45 minutes    Joneen Roach, AGACNP-BC [facility]  See Amion for personal pager PCCM on call pager [phone] until 7pm. Please call Elink 7p-7a. [phone]  03/17/2021 11:36 AM

## 2021-03-17 NOTE — Progress Notes (Signed)
Daily Progress Note   Patient Name: Wayne [name]       Date: 03/17/2021 DOB: [birth year]  Age: 66 y.o. MRN#: [id number] Attending Physician: Thomes Lolling* Primary Care Physician: Kathreen Devoid, [place] Admit Date: 02/08/2021 Length of Stay: 18 days  Reason for Consultation/Follow-up: Establishing goals of care  HPI/Patient Profile:  66 y.o. male  with past medical history of Abnormal pulmonary finding, Alcohol abuse, Anemia, Arthritis, Chronic systolic CHF (congestive heart failure) ([place]), COPD (chronic obstructive pulmonary disease) ([place]), Dyspnea, GERD (gastroesophageal reflux disease), History of blood transfusion (2004), History of stomach ulcers (2004), Hypercholesterolemia, Hypertension, Malnutrition ([place]), Moderate mitral regurgitation, Myocardial infarction ([place]) (2004), Peripheral vascular disease ([place]), Pre-diabetes, Pulmonary hypertension ([place]), and Tricuspid regurgitation (2017). He was originally admitted on 02/10/2021 with right femoral pseudoaneurysm repair per vascular surgery.  He has had a rocky course and on 03/28/2021 went to the operating room for open laparotomy and found small bowel ischemia with repair, open abdomen with wound VAC in place.  Pulmonary critical care has been asked to assist in his care.    He underwent a second exploratory laparotomy and subtotal small bowel resection on 1 7 for abdominal compartment syndrome and diffuse small bowel necrosis.  Left in discontinuity with some patchy ischemia remaining in the small bowel and sigmoid colon at high risk for necrosis.   PMT was consulted for goals of care conversations   1/8 -increasing pressor requirement, decreased urine output.  Subjective:   Subjective: Chart Reviewed. Updates received. Patient Assessed. Created space and opportunity for patient  and family to explore thoughts and feelings regarding current medical situation.  Today's Discussion: I met the patient at the bedside though  he is unresponsive/sedated/intubated and unable to meaningfully communicate.  Later in the day the patient's sister, niece, brother came to the hospital for family meeting.  Family meeting occurred along with Evelena Peat, PA from general surgery.  We discussed the patient's current acute situation.  We sure that the patient appears to be worse this afternoon and then even this morning which was worse than yesterday.  The patient's family agrees.  We feel that he appears to be suffering, they also agree.  He is on sedation and has a fentanyl drip to help with discomfort.  Urine output has decreased.  He has had a change in his oxygen saturations recently that required a bump to 8200% FiO2 on the ventilator.  We shared that his body appears to be actively dying and multisystem organ failure.  We discussed that there is a very low likelihood of survival and even less likelihood of meaningful recovery.  We shared that his TPN has had to be stopped today because elevated triglycerides (approximately 700) which is indicative of likely liver dysfunction.  We shared that in the rare case that he would actually survive he would be dependent on TPN for life which carries with it its own risks such as organ failure including liver failure, infection, failure to thrive.  Family discussed the patient's value of independence.  They do not feel that he would like to be in this situation.  We provided options including full aggressive care with return to surgery (despite unlikely to tolerate further surgical intervention), continued aggressive medical care with no return to the operating room, or shift to comfort focus.  After discussing what disease would look like in his patient's current situation they have elected to transition to comfort care.  They would like to leave him with current treatments today  to allow family time to visit.  We anticipate transition to comfort care tomorrow sometime after family and friends about  adequate time to visit.  I provided emotional general support through therapeutic listening, reminiscence, sharing of stories, empathy, and other techniques.  I answered all questions and addressed all concerns to the best of my ability.  Review of Systems  Unable to perform ROS: Intubated   Objective:   Vital Signs:  BP 111/75    Pulse 89    Temp 98.5 F (36.9 C) (Axillary)    Resp 20    Ht _0  (1.549 m)    Wt 50.3 kg    SpO2 98%    BMI 20.95 kg/m   Physical Exam: Physical Exam Vitals and nursing note reviewed.  Constitutional:      Appearance: He is ill-appearing and toxic-appearing.     Interventions: He is sedated and intubated.  HENT:     Head: Normocephalic and atraumatic.  Pulmonary:     Effort: No respiratory distress. He is intubated.     Breath sounds: No wheezing or rhonchi.  Abdominal:     General: Abdomen is flat.     Palpations: Abdomen is soft.     Comments: Wound vac in place to abdomen  Neurological:     Mental Status: He is unresponsive.     Comments: Movement with discomfort and/or stimulation, no obvious purposeful movement, not following commands.    Palliative Assessment/Data: 10%   Assessment & Plan:   Impression: Present on Admission:  Femoral artery aneurysm (HCC)  Ischemia of extremity  Critically ill 66 year old male with prolonged hospital course with multiple trips to the ER as described above.  Currently with increasing pressor requirements, multisystem organ failure, high risk for any further operative procedures.  They have elected for the patient to be DNR.  They are further considering and discussing with other family members other options including the potential for deciding on no escalation versus comfort care.  There is also the option for continued aggressive care short of cardiopulmonary resuscitation.  Overall short-term and long-term prognosis is very poor.  Family today has elected to a shift in comfort care sometime tomorrow  after adequate time for family/friends to visit.  SUMMARY OF RECOMMENDATIONS   Remain DNR No escalation of care Do not share information with family/friends other than niece Rollene Fare, sister Inez Catalina Continued emotional and spiritual support of family Follow-up tomorrow for compassionate extubation and transition to comfort care I will be off service next 3 days, my colleague Mariana Kaufman, NP will continue to work with the family  Code Status: DNR  Prognosis: Hours - Days  Discharge Planning: Anticipated Hospital Death  Discussed with: Medical team, surgical team, nursing staff, patient's family  Thank you for allowing [place] to participate in the care of AZAEL RAGAIN PMT will continue to support holistically.  Time Total: 120 min  Visit consisted of counseling and education dealing with the complex and emotionally intense issues of symptom management and palliative care in the setting of serious and potentially life-threatening illness. Greater than 50%  of this time was spent counseling and coordinating care related to the above assessment and plan.  Walden Field, NP Palliative Medicine Team  Team Phone # [phone] (Nights/Weekends)  11/05/2020, 8:17 AM

## 2021-03-18 DIAGNOSIS — K55029 Acute infarction of small intestine, extent unspecified: Secondary | ICD-10-CM

## 2021-03-18 DIAGNOSIS — R579 Shock, unspecified: Secondary | ICD-10-CM

## 2021-03-18 DIAGNOSIS — I724 Aneurysm of artery of lower extremity: Secondary | ICD-10-CM | POA: Diagnosis not present

## 2021-03-18 DIAGNOSIS — E43 Unspecified severe protein-calorie malnutrition: Secondary | ICD-10-CM

## 2021-03-18 DIAGNOSIS — K567 Ileus, unspecified: Secondary | ICD-10-CM

## 2021-03-18 DIAGNOSIS — Z515 Encounter for palliative care: Secondary | ICD-10-CM

## 2021-03-18 LAB — TYPE AND SCREEN
ABO/RH(D): A POS
Antibody Screen: NEGATIVE
Unit division: 0
Unit division: 0
Unit division: 0
Unit division: 0
Unit division: 0
Unit division: 0
Unit division: 0

## 2021-03-18 LAB — GLUCOSE, CAPILLARY: Glucose-Capillary: 104 mg/dL — ABNORMAL HIGH (ref 70–99)

## 2021-03-18 LAB — BASIC METABOLIC PANEL
Anion gap: 10 (ref 5–15)
BUN: 36 mg/dL — ABNORMAL HIGH (ref 8–23)
CO2: 21 mmol/L — ABNORMAL LOW (ref 22–32)
Calcium: 6.9 mg/dL — ABNORMAL LOW (ref 8.9–10.3)
Chloride: 102 mmol/L (ref 98–111)
Creatinine, Ser: 3.48 mg/dL — ABNORMAL HIGH (ref 0.61–1.24)
GFR, Estimated: 18 mL/min — ABNORMAL LOW (ref >60)
Glucose, Bld: 90 mg/dL (ref 70–99)
Potassium: 3.7 mmol/L (ref 3.5–5.1)
Sodium: 133 mmol/L — ABNORMAL LOW (ref 135–145)

## 2021-03-18 LAB — BPAM RBC
Blood Product Expiration Date: 202301212359
Blood Product Expiration Date: 202301222359
Blood Product Expiration Date: 202301222359
Blood Product Expiration Date: 202301222359
Blood Product Expiration Date: 202301222359
Blood Product Expiration Date: 202301222359
Blood Product Expiration Date: 202302022359
ISSUE DATE / TIME: 202301061049
ISSUE DATE / TIME: 202301061049
ISSUE DATE / TIME: 202301061109
ISSUE DATE / TIME: 202301061109
ISSUE DATE / TIME: 202301071149
ISSUE DATE / TIME: 202301071149
ISSUE DATE / TIME: 202301071149
Unit Type and Rh: 6200
Unit Type and Rh: 6200
Unit Type and Rh: 6200
Unit Type and Rh: 6200
Unit Type and Rh: 6200
Unit Type and Rh: 6200
Unit Type and Rh: 6200

## 2021-03-18 LAB — CBC
HCT: 25.3 % — ABNORMAL LOW (ref 39.0–52.0)
Hemoglobin: 8.9 g/dL — ABNORMAL LOW (ref 13.0–17.0)
MCH: 30.9 pg (ref 26.0–34.0)
MCHC: 35.2 g/dL (ref 30.0–36.0)
MCV: 87.8 fL (ref 80.0–100.0)
Platelets: 184 10*3/uL (ref 150–400)
RBC: 2.88 MIL/uL — ABNORMAL LOW (ref 4.22–5.81)
RDW: 19.8 % — ABNORMAL HIGH (ref 11.5–15.5)
WBC: 25 10*3/uL — ABNORMAL HIGH (ref 4.0–10.5)
nRBC: 0.5 % — ABNORMAL HIGH (ref 0.0–0.2)

## 2021-03-18 LAB — MAGNESIUM: Magnesium: 2.5 mg/dL — ABNORMAL HIGH (ref 1.7–2.4)

## 2021-03-18 LAB — COOXEMETRY PANEL
Carboxyhemoglobin: 1.3 % (ref 0.5–1.5)
Methemoglobin: 0.9 % (ref 0.0–1.5)
O2 Saturation: 77.9 %
Total hemoglobin: 9.1 g/dL — ABNORMAL LOW (ref 12.0–16.0)

## 2021-03-18 LAB — PHOSPHORUS: Phosphorus: 2.9 mg/dL (ref 2.5–4.6)

## 2021-03-18 MED ORDER — LORAZEPAM 2 MG/ML IJ SOLN
2.0000 mg | INTRAMUSCULAR | Status: DC | PRN
  Administered 2021-03-18: 2 mg via INTRAVENOUS
  Filled 2021-03-18: qty 1

## 2021-03-18 MED ORDER — DEXTROSE 5 % IV SOLN: INTRAVENOUS | Status: DC

## 2021-03-18 MED ORDER — PIPERACILLIN-TAZOBACTAM IN DEX 2-0.25 GM/50ML IV SOLN
2.2500 g | Freq: Three times a day (TID) | INTRAVENOUS | Status: DC
  Filled 2021-03-18: qty 50

## 2021-03-18 MED ORDER — FENTANYL BOLUS VIA INFUSION
100.0000 ug | INTRAVENOUS | Status: DC | PRN
  Filled 2021-03-18: qty 100

## 2021-03-18 MED ORDER — DEXAMETHASONE SODIUM PHOSPHATE 4 MG/ML IJ SOLN
4.0000 mg | Freq: Four times a day (QID) | INTRAMUSCULAR | Status: DC | PRN
  Filled 2021-03-18: qty 1

## 2021-03-18 MED ORDER — POLYVINYL ALCOHOL 1.4 % OP SOLN: 1.0000 [drp] | OPHTHALMIC | Status: DC | PRN

## 2021-03-18 MED ORDER — GLYCOPYRROLATE 0.2 MG/ML IJ SOLN
0.2000 mg | INTRAMUSCULAR | Status: DC | PRN
  Administered 2021-03-18: 0.2 mg via INTRAVENOUS
  Filled 2021-03-18: qty 1

## 2021-03-19 LAB — SURGICAL PATHOLOGY

## 2021-03-28 ENCOUNTER — Inpatient Hospital Stay: Payer: Medicare Other | Admitting: Internal Medicine

## 2021-04-09 NOTE — Progress Notes (Signed)
Daily Progress Note   Patient Name: Wayne [name]       Date: 22-Mar-2021 DOB: 03-15-1955  Age: 66 y.o. MRN#: [id number] Attending Physician: Thomes Lolling* Primary Care Physician: Kathreen Devoid, [place] Admit Date: 02/09/2021  Reason for Consultation/Follow-up: Establishing goals of care  Patient Profile/HPI:   66 y.o. male  with past medical history of Abnormal pulmonary finding, Alcohol abuse, Anemia, Arthritis, Chronic systolic CHF (congestive heart failure) ([place]), COPD (chronic obstructive pulmonary disease) ([place]), Dyspnea, GERD (gastroesophageal reflux disease), History of blood transfusion (2004), History of stomach ulcers (2004), Hypercholesterolemia, Hypertension, Malnutrition ([place]), Moderate mitral regurgitation, Myocardial infarction ([place]) (2004), Peripheral vascular disease ([place]), Pre-diabetes, Pulmonary hypertension ([place]), and Tricuspid regurgitation (2017). He was originally admitted on 02/16/2021 with right femoral pseudoaneurysm repair per vascular surgery.  He has had a rocky course and on 04/08/2021 went to the operating room for open laparotomy and found small bowel ischemia with repair, open abdomen with wound VAC in place.  Pulmonary critical care has been asked to assist in his care.    He underwent a second exploratory laparotomy and subtotal small bowel resection on 1 7 for abdominal compartment syndrome and diffuse small bowel necrosis.  Left in discontinuity with some patchy ischemia remaining in the small bowel and sigmoid colon at high risk for necrosis.   PMT was consulted for goals of care conversations   1/8 -increasing pressor requirement, decreased urine output.  1/9- PMT and surgery met with family- decision made to transition to full comfort  measures only  Subjective: Met with Wayne [name], and Wayne [name].  Answered their questions about extubation and comfort measures. We discussed medications will be used to keep patient comfortable at end of life. Other medications and interventions that are not contributing to comfort will be stopped and patient will be provided with comfort and dignity as he transitions through end of life.  Family is aware that we are uncertain if he will continue to breathe on his own after extubation, however, he is dying from multiple organ system failures including vascular, circulatory, and digestive. They all agree that the primary goal for care is to ensure that he no longer suffers.  Emotional support provided.   Review of Systems  Unable to perform ROS: Intubated    Physical Exam Vitals and nursing note  reviewed.  Constitutional:      Comments: awake  Pulmonary:     Comments: Intubated with full support Neurological:     Comments: UTA orientation, tracks            Vital Signs: BP 94/63    Pulse 89    Temp 98.1 F (36.7 C) (Axillary)    Resp 18    Ht 5' 1" (1.549 m)    Wt 50.3 kg    SpO2 96%    BMI 20.95 kg/m  SpO2: SpO2: 96 % O2 Device: O2 Device: Ventilator O2 Flow Rate: O2 Flow Rate (L/min): 6 L/min  Intake/output summary:  Intake/Output Summary (Last 24 hours) at March 30, 2021 1248 Last data filed at 03/30/21 1100 Gross per 24 hour  Intake 3158.52 ml  Output 4455 ml  Net -1296.48 ml   LBM: Last BM Date: 02/25/2021 Baseline Weight: Weight: 45.4 kg Most recent weight: Weight: 50.3 kg       Palliative Assessment/Data: PPS: 10%      Patient Active Problem List   Diagnosis Date Noted   Ischemic necrosis of small bowel ([place])    Shock ([place])    Protein-calorie malnutrition, severe 03/13/2021   Ischemia of extremity 02/28/2021   Femoral artery aneurysm (HCC) 03/06/2021   Elbow joint effusion, left 09/04/2020   Hepatic cirrhosis ([place]) 06/20/2017   Ileus  06/19/2017    Abdominal pain, acute, left lower quadrant 06/12/2017   Respiratory failure with hypoxia ([place]) 06/12/2017   Acute left lower quadrant pain 06/12/2017   Constipation 06/12/2017   PAH (pulmonary artery hypertension) (HCC)    Aortic 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[place]) 06/01/2017   Malnutrition of moderate degree 05/12/2017   COPD with acute exacerbation ([place]) 05/11/2017   Fever 05/11/2017   ETOH abuse 05/11/2017   Anemia    COPD (chronic obstructive pulmonary disease) (HCC)    Hypertension    Critical lower limb ischemia ([place]) 04/01/2017   Peripheral vascular disease ([place]) 03/22/2017   Simple chronic bronchitis ([place]) 03/22/2017   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 12/11/2016   Degenerative disc disease, lumbar 12/11/2016   Primary osteoarthritis of right hip 12/11/2016   Hypokalemia 67/61/9509   Chronic systolic heart failure ([place]) 01/18/2016   Acute respiratory failure with hypoxia (HCC) 01/18/2016   Bullous lesion 01/18/2016   Drinks beer 01/18/2016   Hyperlipidemia 01/18/2016   Hyponatremia 01/18/2016   Tobacco abuse 01/18/2016    Palliative Care Assessment & Plan    Assessment/Recommendations/Plan  End of life d/t multisystem organ failures, sepsis, s/p small bowel ischemia and necrosis with complete small bowel resection Plan for extubation and implementation of comfort measures only today when family is ready- orders are entered and family will notify RN when ready Currently on Fentanyl 169mg/hr but he looks uncomfortable- increase rate to 2044m and use bolus doses as needed- increase hourly drip rate by 50% if 3 bolus doses are required in one hour period Lorazepam 10m58mV q30 min prn for agitation, anxiety- please give a dose prior to extubation Robinul 0.10mg70m q4hr prn for secretions D/C labs, medications, interventions that are not contributing to comfort   Code Status: DNR  Prognosis:  Hours - Days  Discharge Planning: Anticipated Hospital  Death  Care plan was discussed with patient's family and care team  Thank you for allowing the Palliative Medicine Team to assist in the care of this patient.  Total time: 65 minutes Prolonged billing: yes     Greater than 50%  of this time was  spent counseling and coordinating care related to the above assessment and plan.  Mariana Kaufman, AGNP-C Palliative Medicine   Please contact Palliative Medicine Team phone at [phone] for questions and concerns.

## 2021-04-09 NOTE — Progress Notes (Signed)
Time of death verified by RN Delorise Jackson and RN Trish Mage. Family at bedside. MD Servando Snare called and notified of time of death.   1310: Family leaves with all of patient's belongings

## 2021-04-09 NOTE — Progress Notes (Signed)
This chaplain phoned the Pt. RN-Kelly in preparation for family spiritual care consult from Palliative Care Team. The chaplain understands the Pt. sister and niece are not present at this time.    This chaplain is available for F/U spiritual care as needed.  Chaplain Stephanie Acre [phone]

## 2021-04-09 NOTE — Progress Notes (Signed)
Patient ID: SAHEED PONTRELLI, male   DOB: Dec 03, 1955, 66 y.o.   MRN: [id number] [facility] Surgery Progress Note  3 Days Post-Op  Subjective: Remains about the same today with no significant change overnight.  Objective: Vital signs in last 24 hours: Temp:  [97.7 F (36.5 C)-98.5 F (36.9 C)] 98.1 F (36.7 C) (01/10 0800) Pulse Rate:  [79-98] 89 (01/10 0808) Resp:  [9-24] 24 (01/10 0808) BP: (101-141)/(50-99) 131/71 (01/10 0808) SpO2:  [91 %-100 %] 96 % (01/10 0808) FiO2 (%):  [50 %-80 %] 80 % (01/10 0808) Last BM Date: 02/06/2021  Intake/Output from previous day: 01/09 0701 - 01/10 0700 In: 4363.9 [I.V.:3710; IV Piggyback:653.9] Out: 3670 [Urine:370; Drains:3300] Intake/Output this shift: Total I/O In: 64.5 [I.V.:52; IV Piggyback:12.5] Out: -   PE: Gen:  critically ill on vent Abd: distended still with VAC in place, SS output in VAC cannister, but more bloody today than yesterday 3300cc out in 24 hrs.  Abthera VAC in place.  NGT in place   Lab Results:  Recent Labs    03/17/21 0421 03-Apr-2021 0440  WBC 33.2* 25.0*  HGB 9.3* 8.9*  HCT 26.2* 25.3*  PLT 185 184   BMET Recent Labs    03/17/21 0421 2021/04/03 0440  NA 129* 133*  K 4.0 3.7  CL 98 102  CO2 21* 21*  GLUCOSE 148* 90  BUN 30* 36*  CREATININE 2.56* 3.48*  CALCIUM 6.8* 6.9*   PT/INR No results for input(s): LABPROT, INR in the last 72 hours.  CMP     Component Value Date/Time   NA 133 (L) 04-03-2021 0440   K 3.7 April 03, 2021 0440   CL 102 2021-04-03 0440   CO2 21 (L) 03-Apr-2021 0440   GLUCOSE 90 04-03-2021 0440   BUN 36 (H) 04/03/21 0440   CREATININE 3.48 (H) 03-Apr-2021 0440   CALCIUM 6.9 (L) Apr 03, 2021 0440   PROT 3.7 (L) 03/17/2021 0421   ALBUMIN 1.6 (L) 03/17/2021 0421   AST 130 (H) 03/17/2021 0421   ALT 54 (H) 03/17/2021 0421   ALKPHOS 122 03/17/2021 0421   BILITOT 1.3 (H) 03/17/2021 0421   GFRNONAA 18 (L) 03-Apr-2021 0440   GFRAA >60 06/21/2017 0305   Lipase     Component Value  Date/Time   LIPASE 24 06/11/2017 1502       Studies/Results: No results found.  Anti-infectives: Anti-infectives (From admission, onward)    Start     Dose/Rate Route Frequency Ordered Stop   03/17/21 1000  doxycycline (VIBRAMYCIN) 100 mg in sodium chloride 0.9 % 250 mL IVPB        100 mg 125 mL/hr over 120 Minutes Intravenous Every 12 hours 03/17/21 0917     03/15/2021 2200  doxycycline (VIBRA-TABS) tablet 100 mg  Status:  Discontinued        100 mg Per Tube Every 12 hours 03/12/2021 2000 03/17/21 0917   03/17/2021 2000  piperacillin-tazobactam (ZOSYN) IVPB 3.375 g        3.375 g 12.5 mL/hr over 240 Minutes Intravenous Every 8 hours 04/07/2021 1847     03/19/2021 1415  Ampicillin-Sulbactam (UNASYN) 3 g in sodium chloride 0.9 % 100 mL IVPB  Status:  Discontinued        3 g 200 mL/hr over 30 Minutes Intravenous Every 6 hours 03/22/2021 1325 03/10/2021 1847   04/04/2021 0915  piperacillin-tazobactam (ZOSYN) IVPB 3.375 g  Status:  Discontinued        3.375 g 12.5 mL/hr over 240 Minutes Intravenous Every 8  hours 04/05/2021 0827 05-Apr-2021 1324   03/09/21 1430  doxycycline (VIBRA-TABS) tablet 100 mg  Status:  Discontinued        100 mg Oral Every 12 hours 03/09/21 1342 04-05-21 2000   03/09/21 1430  amoxicillin-clavulanate (AUGMENTIN) 875-125 MG per tablet 1 tablet  Status:  Discontinued        1 tablet Oral Every 12 hours 03/09/21 1342 04/05/21 0827   02/26/2021 1700  vancomycin (VANCOREADY) IVPB 750 mg/150 mL  Status:  Discontinued        750 mg 150 mL/hr over 60 Minutes Intravenous Every 24 hours 03/01/2021 1423 03/09/21 1342   02/24/2021 1600  Ampicillin-Sulbactam (UNASYN) 3 g in sodium chloride 0.9 % 100 mL IVPB  Status:  Discontinued        3 g 200 mL/hr over 30 Minutes Intravenous Every 6 hours 02/08/2021 1423 03/09/21 1342   03/07/21 1200  doxycycline (VIBRA-TABS) tablet 100 mg  Status:  Discontinued        100 mg Oral Every 12 hours 03/07/21 1109 02/18/2021 1407   03/07/21 1200  amoxicillin-clavulanate  (AUGMENTIN) 875-125 MG per tablet 1 tablet  Status:  Discontinued        1 tablet Oral Every 12 hours 03/07/21 1109 02/14/2021 1407   03/06/21 2122  vancomycin (VANCOCIN) IVPB 1000 mg/200 mL premix  Status:  Discontinued        1,000 mg 200 mL/hr over 60 Minutes Intravenous Every 24 hours 03/06/21 0519 03/07/21 1109   03/06/21 1330  amoxicillin (AMOXIL) capsule 500 mg        500 mg Oral  Once 03/06/21 1238 03/06/21 1436   03/01/21 1000  vancomycin (VANCOCIN) IVPB 1000 mg/200 mL premix  Status:  Discontinued        1,000 mg 200 mL/hr over 60 Minutes Intravenous Every 36 hours 02/28/21 0326 03/06/21 0519   02/28/21 1600  ceFEPIme (MAXIPIME) 2 g in sodium chloride 0.9 % 100 mL IVPB  Status:  Discontinued        2 g 200 mL/hr over 30 Minutes Intravenous Every 12 hours 02/28/21 0326 03/07/21 1109   02/28/21 0415  ceFEPIme (MAXIPIME) 2 g in sodium chloride 0.9 % 100 mL IVPB        2 g 200 mL/hr over 30 Minutes Intravenous  Once 02/28/21 0315 02/28/21 0530   02/12/2021 2232  vancomycin (VANCOCIN) 1-5 GM/200ML-% IVPB       Note to Pharmacy: Brien Mates D: cabinet override      02/20/2021 2232 02/28/21 0319        Assessment/Plan S/p repair of right aortobifemoral graft pseudoaneurysm 03/01/2021 Dr. Randie Heinz POD 05/10/08, S/p diagnostic laparoscopy 02/19/2021 Dr. Derrell Lolling, ex lap with detorsion of SB volvulus with open abdomen Dr. Fredricka Bonine 1/6, take back with SBR of all but 90cm due to ischemia, still with patchy ischemia in remaining bowel, Dr. Bedelia Person 1/7  -family has decided to proceed with transition to comfort care likely this morning.  Defer to palliative care and meeting this morning on when this will occur. -apparently it wasn't relayed in report but Brother, sister Kathie Rhodes, and niece Rene Kocher, requested yesterday to not update any family, and to direct them to any of those 3 to answer their questions regarding his condition -no further general surgery plans -leave VAC in place to control drainage for  planned transition today. -no return to OR  ID - currently augmentin/ doxycycline 1/1>> stopped augmentin 1/6, zosyn 1/6 --> FEN - NPO, IVFs/NGT/wound VAC (open abdomen) VTE - sq  heparin Foley - in place  HTN HLD GERD COPD CHF Pulmonary HTN Hx alcohol abuse Hx PUD Acute respiratory failure - on 6L [place] Lactic acidosis Leukocytosis Tachycardia   LOS: 19 days    Letha Cape, [facility] Surgery 03-22-2021, 8:48 AM Please see Amion for pager number during day hours 7:00am-4:30pm

## 2021-04-09 NOTE — Progress Notes (Addendum)
°  ° °  Progress Note    04/01/2021 8:43 AM 3 Days Post-Op  Subjective:  intubated and sedated   Vitals:   03/25/2021 0800 03/29/2021 0808  BP:  131/71  Pulse:  89  Resp:  (!) 24  Temp: 98.1 F (36.7 C)   SpO2:  96%   Physical Exam: Lungs:  mechanical ventilation Incisions:  no obvious bleeding from R groin Extremities:  feet warm to touch Abdomen:  vac in place with good seal Neurologic: sedated  CBC    Component Value Date/Time   WBC 25.0 (H) 03/30/2021 0440   RBC 2.88 (L) 03/23/2021 0440   HGB 8.9 (L) 03/17/2021 0440   HCT 25.3 (L) 04/07/2021 0440   PLT 184 04/04/2021 0440   MCV 87.8 04/06/2021 0440   MCH 30.9 03/14/2021 0440   MCHC 35.2 03/25/2021 0440   RDW 19.8 (H) 03/20/2021 0440   LYMPHSABS 1.3 03-27-2021 0521   MONOABS 1.3 (H) 03/27/21 0521   EOSABS 0.0 03-27-2021 0521   BASOSABS 0.0 03-27-2021 0521    BMET    Component Value Date/Time   NA 133 (L) 03/14/2021 0440   K 3.7 03/09/2021 0440   CL 102 03/19/2021 0440   CO2 21 (L) 04/07/2021 0440   GLUCOSE 90 03/30/2021 0440   BUN 36 (H) 03/22/2021 0440   CREATININE 3.48 (H) 04/04/2021 0440   CALCIUM 6.9 (L) 03/10/2021 0440   GFRNONAA 18 (L) 03/16/2021 0440   GFRAA >60 06/21/2017 0305    INR    Component Value Date/Time   INR 1.9 (H) 2021/03/27 0113     Intake/Output Summary (Last 24 hours) at 03/23/2021 0843 Last data filed at 03/30/2021 0800 Gross per 24 hour  Intake 4101.74 ml  Output 3670 ml  Net 431.74 ml     Assessment/Plan:  66 y.o. male is s/p R CFA pseudoaneurysm repair 17 Days post op. S/p diagnostic laparoscopy 03/01/2021, ex lap with detorsion of SB volvulus with open abdomen 1/6, take back with SBR of all but 90cm due to ischemia, still with patchy ischemia in remaining bowel 1/7  3 Days Post-Op   Patient is critically ill sedated on ventilator.  He is requiring high dose pressor support.  Spoke briefly with wife at the bedside about prognosis.  She has some questions for the  general surgery group however the understanding is that he will likely transition to comfort care later today.  R groin dressing recently changed with no obvious breakthrough bleeding   Emilie Rutter, PA-C [facility] [phone] 03/28/2021 8:43 AM  I have independently interviewed and examined patient and agree with PA assessment and plan above. Family updated at bedside.   Daivon Rayos C. Randie Heinz, MD [facility] [place] Office: [phone] Pager: [phone]

## 2021-04-09 NOTE — Accreditation Note (Signed)
Restraints not reported to CMS Pursuant to regulation 482.13 (G) (3) use of soft wrist restraints was logged on 03/19/2021.

## 2021-04-09 NOTE — Discharge Summary (Signed)
Wayne [name] MRN: [id number] DOB/AGE: Nov 07, 1955 66 y.o.  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  Reexploration right groin greater than 30 days 2.  Repair of right common femoral pseudoaneurysm with 8 mm Dacron interposition graft from right limb of aortobifemoral bypass to profunda branches 3.  Ligation of right common femoral artery 4.  Sartorial muscle flap coverage of both right femoral graft 5.  Negative pressure dressing placement right groin wound  PROCEDURE:  Procedure(s): LAPAROSCOPY DIAGNOSTIC (N/A)  Procedure performed: exploratory laparotomy, reduction of small bowel volvulus, placement of abdominal wound vac  Procedure: exploratory laparotomy, subtotal small bowel resection, hepatorrhaphy   Discharged Condition: deceased  Hospital Course: Patient was initially admitted with R common femoral pseudoaneurysm from previous aortobifemoral bypass. This was repaired urgently. Unfortunately he developed severe abdominal pain post operatively and required diagnostic laparoscopy which was negative and he initially improved but later again decompensated with elevated leukocytosis and required Sporter laparotomy where he underwent reduction of small bowel volvulus and placement of wound VAC.  He was then taken back to the operating room for subtotal small bowel resection.  After this time there was discussion with the family about his ability to survive the severity of this insult and he was made comfort care and palliatively extubated on the day 2022/03/25 time of death 1318/06/23.  Consults:   General surgery CCM  Significant Diagnostic Studies: CBC CBC Latest Ref Rng & Units 2021-03-25 03/17/2021 03/16/2021  WBC 4.0 - 10.5 K/uL 25.0(H) 33.2(H) -  Hemoglobin 13.0 - 17.0 g/dL 8.9(L) 9.3(L) 9.5(L)  Hematocrit 39.0 - 52.0 % 25.3(L) 26.2(L) 28.0(L)  Platelets 150 - 400 K/uL 184 185 -     COAG Lab Results  Component Value Date   INR 1.9 (H) 03/14/2021   INR 1.1 02/20/2021   INR 1.21 06/19/2017   No results found for: PTT  Disposition:   Discharge Instructions     Diet - low sodium heart healthy   Complete by: As directed    Increase activity slowly   Complete by: As directed       Allergies as of 03-25-21   No Known Allergies      Medication List     TAKE these medications    acetaminophen 500 MG tablet Commonly known as: TYLENOL Take 2 tablets (1,000 mg total) by mouth every 6 (six) hours as needed. What changed:  how much to take when to take this reasons to take this   albuterol 108 (90 Base) MCG/ACT inhaler Commonly known as: VENTOLIN HFA Inhale 2 puffs into the lungs every 6 (six) hours as needed for wheezing or shortness of breath.   aspirin EC 81 MG tablet Take 81 mg by mouth daily.   Breztri Aerosphere 160-9-4.8 MCG/ACT Aero Generic drug: Budeson-Glycopyrrol-Formoterol Inhale 1 puff into the lungs daily.   furosemide 20 MG tablet Commonly known as: LASIX Take 1 tablet (20 mg total) by mouth daily.   multivitamin with minerals Tabs tablet Take 1 tablet by mouth daily.   omeprazole 20 MG capsule Commonly known as: PRILOSEC Take 1 capsule (20 mg total) by mouth daily as needed (for indegestion).  rosuvastatin 40 MG tablet Commonly known as: CRESTOR Take 40 mg by mouth daily.       ASK your doctor about these medications    famotidine 20 MG tablet Commonly known as: PEPCID Take 20 mg by mouth 2 (two) times daily before a meal.        Follow-up Information     [facility] Follow up.   Specialty: [place] Why: [facility] for home wound VAC drsg changes ([facility] wound VAC) Contact information: [place] [phone]                 [place] C. Donzetta Matters, MD [facility] [place] Office: [phone] Pager: [phone]  03/21/2021, 9:15 AM

## 2021-04-09 NOTE — Progress Notes (Signed)
Pharmacy Antibiotic Note  Wayne [name] is a 66 y.o. male admitted on 03/04/2021 with  intra-abdominal infection .  Pharmacy has been consulted for Zosyn dosing.  Renal function worsening, afebrile, WBC 25.  Plan: Reduce Zosyn to 2.25gm IV Q8H, 30 min infusion Monitor renal fxn, GoC  Height: 5\' 1"  (154.9 cm) Weight: 50.3 kg (110 lb 14.3 oz) IBW/kg (Calculated) : 52.3  Temp (24hrs), Avg:98 F (36.7 C), Min:97.7 F (36.5 C), Max:98.5 F (36.9 C)  Recent Labs  Lab 03/12/21 1042 03/13/21 0213 03/23/2021 0628 03/28/2021 1213 03/11/2021 1758 04/06/2021 2113 03/14/2021 0110 03/17/2021 0113 04/02/2021 0521 03/16/21 0444 03/17/21 0421 03/30/2021 0440  WBC  --    < > 33.4* 36.3*  --    < >  --  32.7* 32.0* 40.4* 33.2* 25.0*  CREATININE  --    < > 1.25* 0.89  --   --   --  1.25*  --  2.13* 2.56* 3.48*  LATICACIDVEN 1.1  --  3.5* 5.4* 2.8*  --  2.6*  --   --   --   --   --    < > = values in this interval not displayed.     Estimated Creatinine Clearance: 14.7 mL/min (A) (by C-G formula based on SCr of 3.48 mg/dL (H)).    No Known Allergies   Annalyn Blecher D. 05/16/21, PharmD, BCPS, BCCCP 03/14/2021, 9:14 AM

## 2021-04-09 NOTE — Progress Notes (Signed)
NAME:  Wayne [name], MRN:  [id number], DOB:  1955-07-04, LOS: 49 ADMISSION DATE:  02/26/2021, CONSULTATION DATE: 03/31/2021 REFERRING MD: [facility], CHIEF COMPLAINT: Sepsis small bowel ischemia obstruction  History of Present Illness:  66 year old male is originally admitted with right femoral pseudoaneurysm repair per vascular surgery.  He has had a rocky course and on 03/28/2021 went to the operating room for open laparotomy and found small bowel ischemia with repair, open abdomen with wound VAC in place.  Pulmonary critical care has been asked to assist in his care.   Pertinent  Medical History    has a past medical history of Abnormal pulmonary finding, Alcohol abuse, Anemia, Arthritis, Chronic systolic CHF (congestive heart failure) ([place]), COPD (chronic obstructive pulmonary disease) ([place]), Dyspnea, GERD (gastroesophageal reflux disease), History of blood transfusion (2004), History of stomach ulcers (2004), Hypercholesterolemia, Hypertension, Malnutrition ([place]), Moderate mitral regurgitation, Myocardial infarction ([place]) (2004), Peripheral vascular disease ([place]), Pre-diabetes, Pulmonary hypertension ([place]), and Tricuspid regurgitation (2017).   Significant Hospital Events: Including procedures, antibiotic start and stop dates in addition to other pertinent events   12/22 Admit with right femoral pseudoaneurysm repair per vascular surgery.  03/26/2021 Developed small bowel ischemia to operating room for open laparotomy 1/6-Aggressive fluid resuscitation for ongoing shock.  Started on epoprostenol and dobutamine for RV dysfunction on point-of-care echo 1/7-taken back to the OR for extensive small bowel necrosis.  Palliative care consult 1/8 palliative care consulted, Code status changed to DNR.  1/9 [place] meeting. Elected to transition to comfort care once family has had a chance to see him.   Interim History / Subjective:  No acute events overnight  Objective   Blood pressure 128/70, pulse 91,  temperature 98.5 F (36.9 C), temperature source Axillary, resp. rate 12, height 5\' 1"  (1.549 m), weight 50.3 kg, SpO2 96 %. CVP:  [7 mmHg-13 mmHg] 7 mmHg  Vent Mode: PRVC FiO2 (%):  [50 %-80 %] 80 % Set Rate:  [20 bmp-22 bmp] 20 bmp Vt Set:  [420 mL] 420 mL PEEP:  [10 cmH20] 10 cmH20 Plateau Pressure:  [19 cmH20-26 cmH20] 22 cmH20   Intake/Output Summary (Last 24 hours) at 30-Mar-2021 0731 Last data filed at 03/30/21 0600 Gross per 24 hour  Intake 3111.69 ml  Output 3670 ml  Net -558.31 ml    Filed Weights   02/15/2021 1624 03/03/21 0407 04/08/2021 0100  Weight: 45.4 kg 43.8 kg 50.3 kg    Examination:  General:  Frail elderly male on  vent Neuro:  sedated, PERRL,  HEENT:  [place]/AT, no JVD, subconjunctival hemorrhage on the R Cardiovascular:  RRR, no MRG Lungs:  Clear bilateral breath sounds Abdomen:  Wound vac draining serosanguinous Musculoskeletal:  No acute deformity, no edema.  Skin:  abdominal incision with wound vac.   Ancillary tests personally reviewed:   Echocardiogram showing severe RV dysfunction.  Low normal LVEF.  Assessment & Plan:   Refractory shock secondary to sepsis, bowel ischemia Abdominal compartment syndrome with open abdomen Family has decided for comfort care once they have all had a chance to visit. Likely fully transition sometime today.  Continue pressors, antibiotics, stress steroids for now  Pulmonary hypertension Continue epoprostenol until transitioned to comfort  Femoral pseudoaneurysm status postrepair Management per vascular  Severe malnutrition Continue TPN, currently on hold due to hypertriglyceridemia, D5 running in its place  Kidney injury Worsening, comfort measures.   Goals of care Comfort measures with plans for compassionate extubation sometime later today. Awaiting the direction of his family.   Best  Practice (right click and "Reselect all SmartList Selections" daily)   Diet/type: NPO TPN DVT prophylaxis: prophylactic  heparin  GI prophylaxis: PPI Lines: Central line Arterial line removed 1/9, wasn't working.  Foley:  Yes, and it is still needed Code Status:  full code Last date of multidisciplinary goals of care discussion []   Critical care time:     Georgann Housekeeper, AGACNP-BC [place] for personal pager PCCM on call pager [phone] until 7pm. Please call Elink 7p-7a. [phone]  04-09-2021 7:31 AM

## 2021-04-09 DEATH — deceased
